# Patient Record
Sex: Female | Born: 1950
Health system: Southern US, Community
[De-identification: ages and names within clinical notes are randomized; demographics above are authoritative.]

## PROBLEM LIST (undated history)

## (undated) DIAGNOSIS — G2581 Restless legs syndrome: Secondary | ICD-10-CM

## (undated) DIAGNOSIS — F99 Mental disorder, not otherwise specified: Secondary | ICD-10-CM

## (undated) DIAGNOSIS — J449 Chronic obstructive pulmonary disease, unspecified: Secondary | ICD-10-CM

## (undated) DIAGNOSIS — C773 Secondary and unspecified malignant neoplasm of axilla and upper limb lymph nodes: Secondary | ICD-10-CM

## (undated) DIAGNOSIS — C50912 Malignant neoplasm of unspecified site of left female breast: Secondary | ICD-10-CM

## (undated) DIAGNOSIS — Z9889 Other specified postprocedural states: Secondary | ICD-10-CM

## (undated) DIAGNOSIS — K219 Gastro-esophageal reflux disease without esophagitis: Secondary | ICD-10-CM

## (undated) DIAGNOSIS — F419 Anxiety disorder, unspecified: Secondary | ICD-10-CM

## (undated) DIAGNOSIS — G219 Secondary parkinsonism, unspecified: Secondary | ICD-10-CM

## (undated) DIAGNOSIS — R112 Nausea with vomiting, unspecified: Secondary | ICD-10-CM

## (undated) DIAGNOSIS — Z87442 Personal history of urinary calculi: Secondary | ICD-10-CM

## (undated) DIAGNOSIS — C801 Malignant (primary) neoplasm, unspecified: Secondary | ICD-10-CM

## (undated) DIAGNOSIS — G709 Myoneural disorder, unspecified: Secondary | ICD-10-CM

## (undated) DIAGNOSIS — N183 Chronic kidney disease, stage 3 (moderate): Secondary | ICD-10-CM

## (undated) DIAGNOSIS — E1165 Type 2 diabetes mellitus with hyperglycemia: Secondary | ICD-10-CM

## (undated) DIAGNOSIS — M1990 Unspecified osteoarthritis, unspecified site: Secondary | ICD-10-CM

## (undated) DIAGNOSIS — E039 Hypothyroidism, unspecified: Secondary | ICD-10-CM

## (undated) DIAGNOSIS — E119 Type 2 diabetes mellitus without complications: Secondary | ICD-10-CM

## (undated) DIAGNOSIS — G4730 Sleep apnea, unspecified: Secondary | ICD-10-CM

## (undated) DIAGNOSIS — Z794 Long term (current) use of insulin: Secondary | ICD-10-CM

## (undated) HISTORY — DX: Restless legs syndrome: G25.81

## (undated) HISTORY — DX: Long term (current) use of insulin: Z79.4

## (undated) HISTORY — PX: APPENDECTOMY: SHX54

## (undated) HISTORY — PX: ROTATOR CUFF REPAIR: SHX139

## (undated) HISTORY — DX: Type 2 diabetes mellitus with hyperglycemia: E11.65

## (undated) HISTORY — PX: EXPLORATORY LAPAROTOMY: SUR591

## (undated) HISTORY — DX: Secondary parkinsonism, unspecified: G21.9

## (undated) HISTORY — PX: HEMORRHOID SURGERY: SHX153

## (undated) HISTORY — DX: Chronic kidney disease, stage 3 (moderate): N18.3

## (undated) HISTORY — PX: ANKLE SURGERY: SHX546

## (undated) HISTORY — PX: TONSILLECTOMY: SUR1361

## (undated) HISTORY — PX: TUBAL LIGATION: SHX77

## (undated) HISTORY — PX: CHOLECYSTECTOMY: SHX55

---

## 2003-06-30 ENCOUNTER — Inpatient Hospital Stay (HOSPITAL_COMMUNITY): Admission: AD | Admit: 2003-06-30 | Discharge: 2003-07-09 | Payer: Self-pay | Admitting: Psychiatry

## 2003-07-02 ENCOUNTER — Encounter: Payer: Self-pay | Admitting: Psychiatry

## 2003-07-02 IMAGING — CT CT HEAD W/O CM
2 of 3 series · 14 of 33 positions shown, 18 images · non-contrast
Comparison: none

CLINICAL DATA: 52 year old with confusion.  
 CT OF THE HEAD WITHOUT CONTRAST
 No prior exams available for comparison. 
 Axial images are acquired through the brain at 5 mm increments without contrast.  There is no intra- or extra-axial fluid collection or mass.  The basilar cisterns and ventricles have a normal appearance.  Bone windows are unremarkable.  
 IMPRESSION
 No CT evidence for acute intracranial abnormality.

[Series 2: — · sagittal · 0.49mm/px · 1 of 3 slices shown (1 of 2)]
[im 2/3  brain]
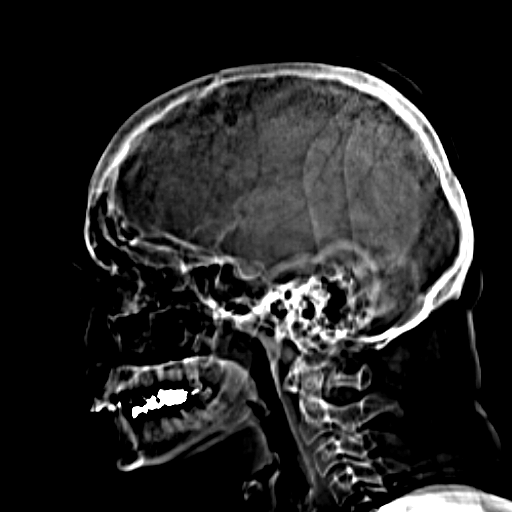

[Series 3: — · axial · 0.43mm/px · z∈[-171,-51]mm · 13 of 29 slices shown, 17 images (2 of 2)]
[im 3/29  brain]
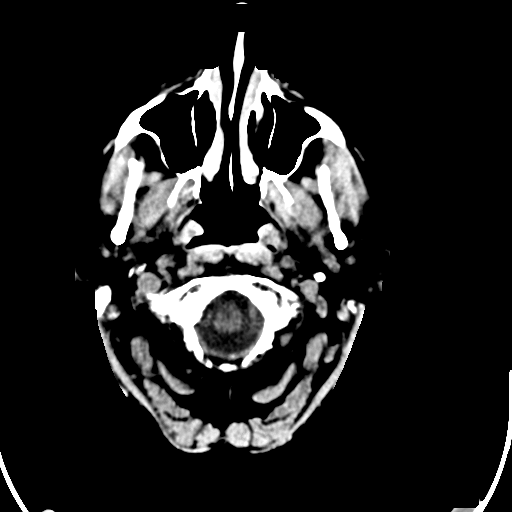
[im 3/29  bone]
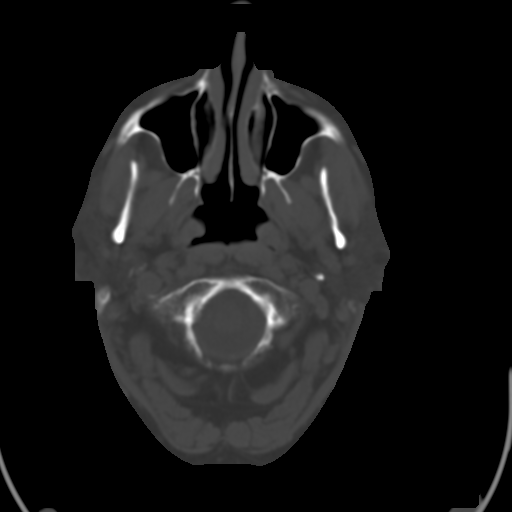
[im 5/29  brain]
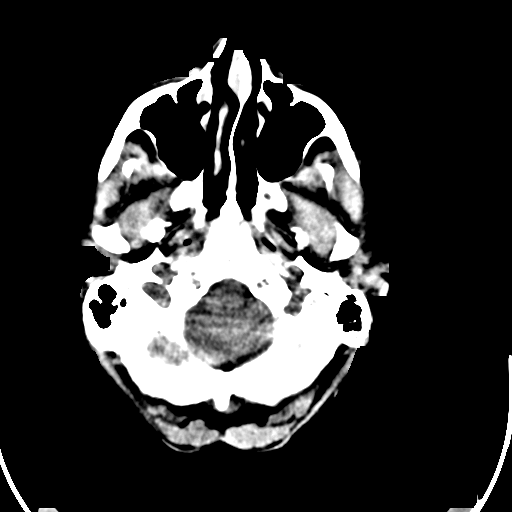
[im 7/29  brain]
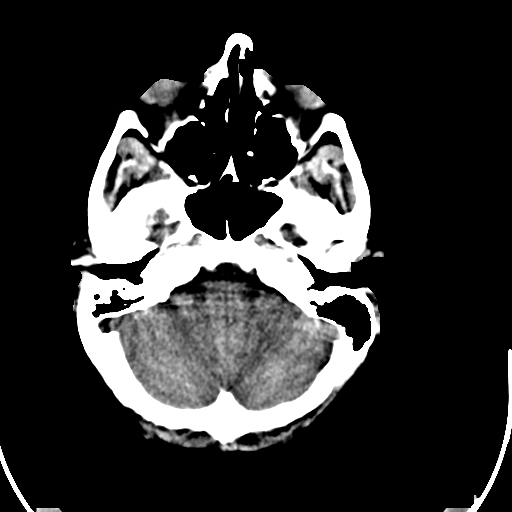
[im 9/29  brain]
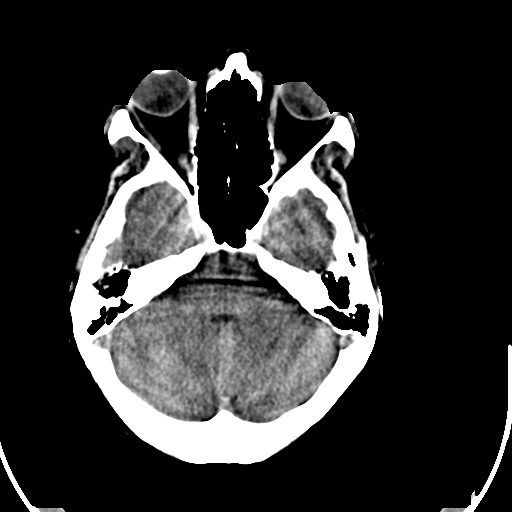
[im 11/29  brain]
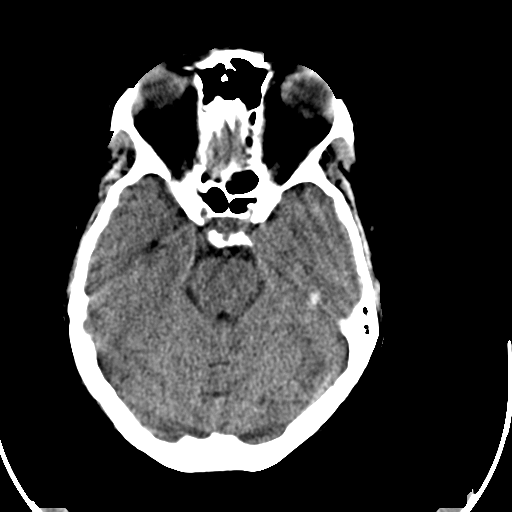
[im 11/29  bone]
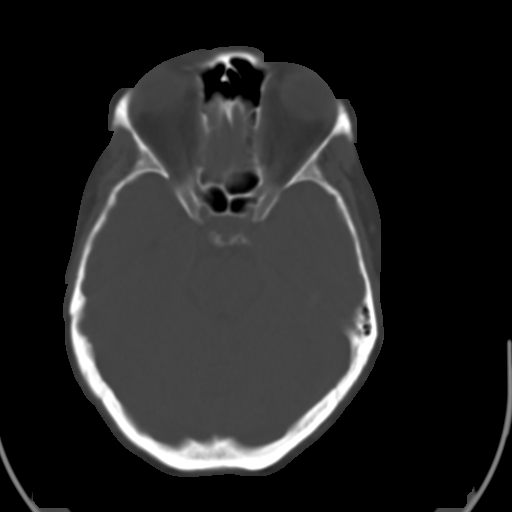
[im 13/29  brain]
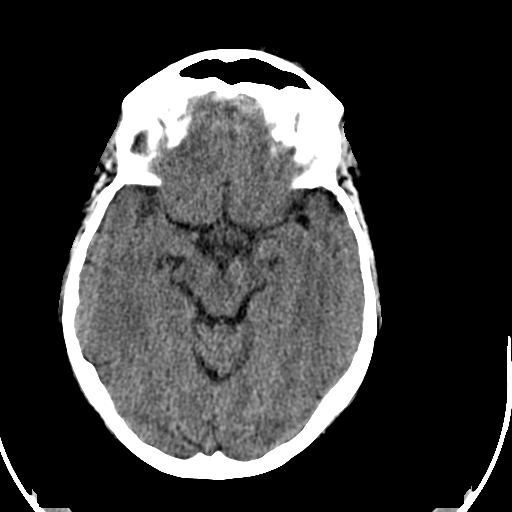
[im 15/29  brain]
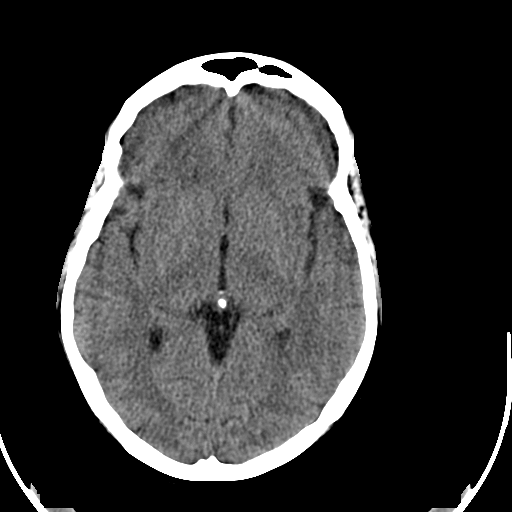
[im 17/29  brain]
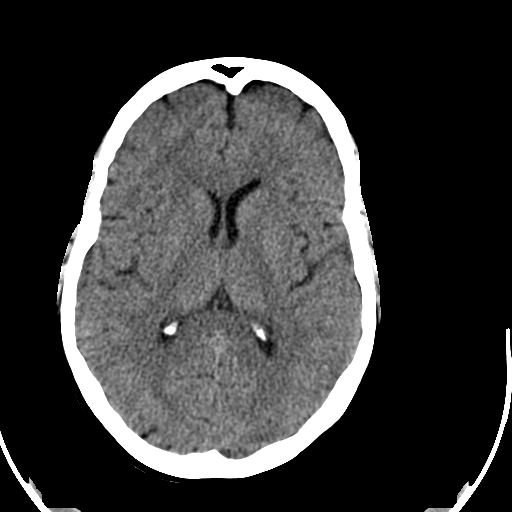
[im 19/29  brain]
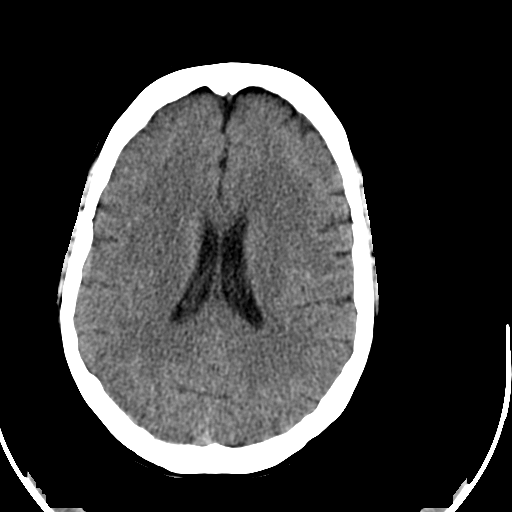
[im 19/29  bone]
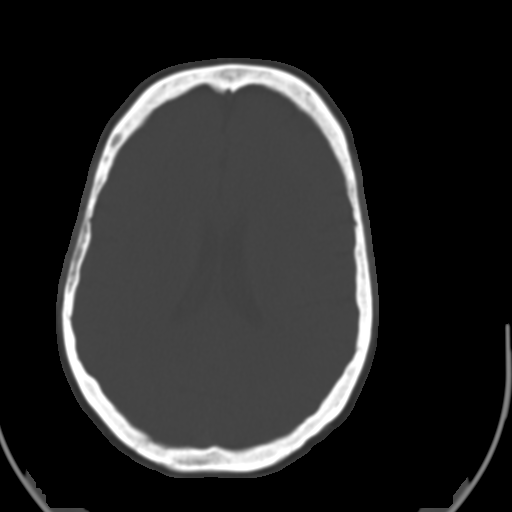
[im 21/29  brain]
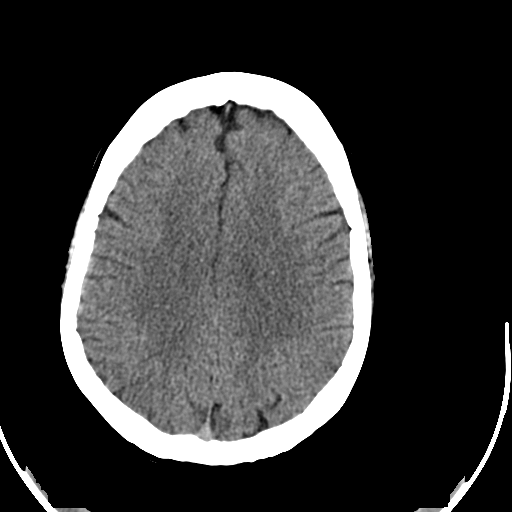
[im 23/29  brain]
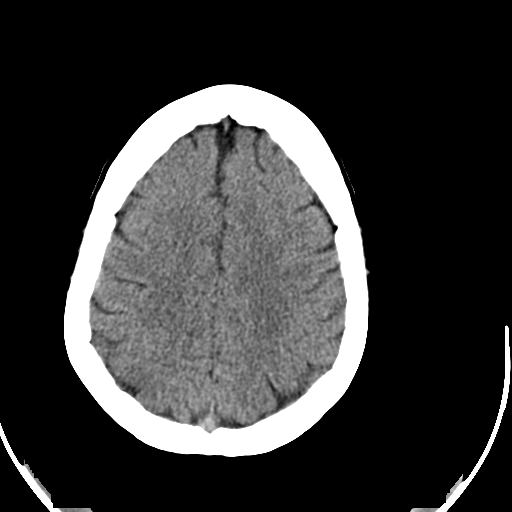
[im 25/29  brain]
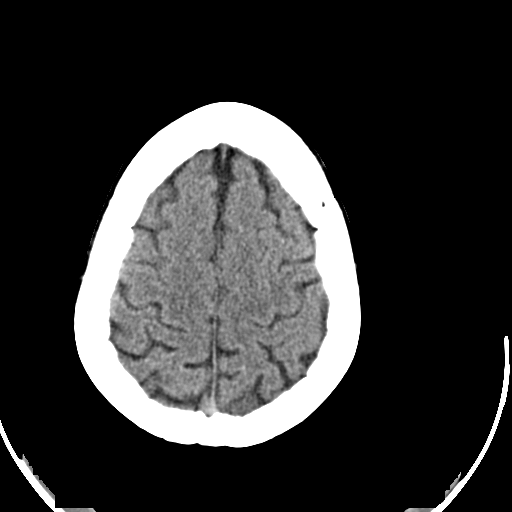
[im 27/29  brain]
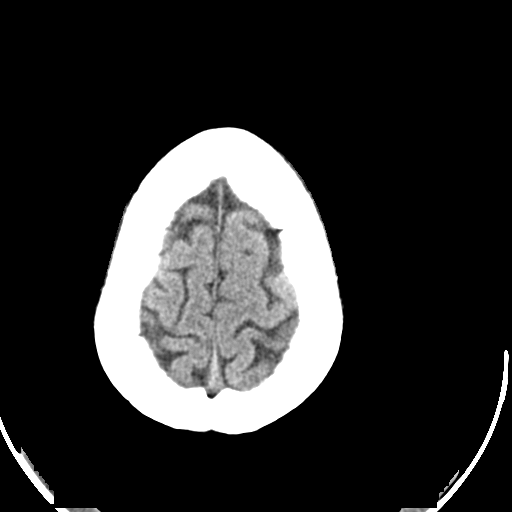
[im 27/29  bone]
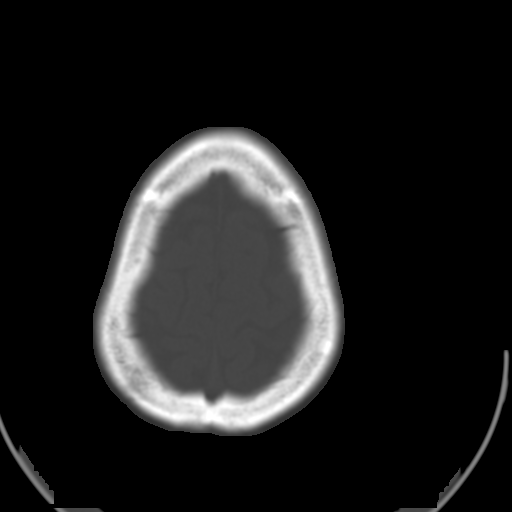

[14 of 33 positions shown; findings below may reference images not displayed]

## 2003-07-05 ENCOUNTER — Encounter: Payer: Self-pay | Admitting: Emergency Medicine

## 2003-07-05 IMAGING — CR DG CHEST 2V
2 series · 2 of 2 positions shown · non-contrast
Comparison: none

CLINICAL DATA: Swelling of the lower extremities; lethargy
 TWO VIEW CHEST
 No prior studies. 
 Thoracic spondylosis is present.  The lungs appear clear.  Heart and mediastinum appear unremarkable.  On the lateral view, a hernia measures evident. 
 IMPRESSION
 No acute findings.

[view not recorded (1 of 2)]
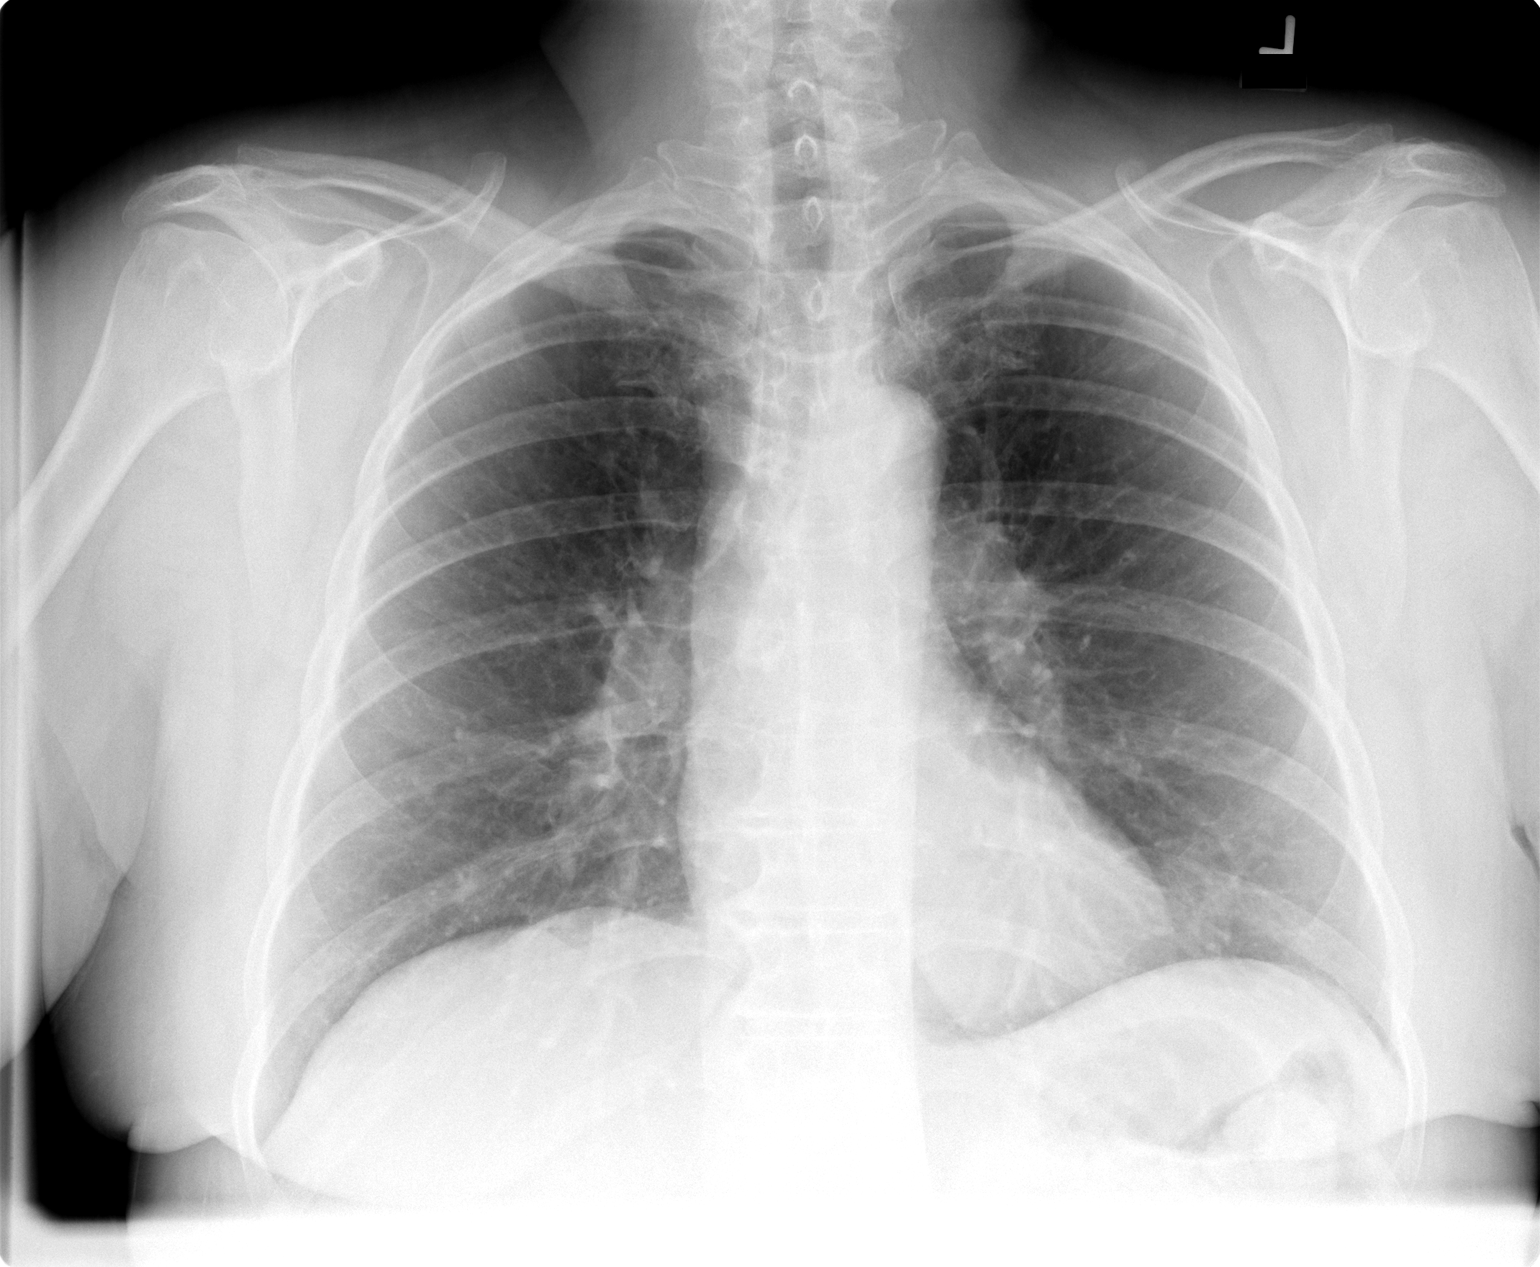

[view not recorded (2 of 2)]
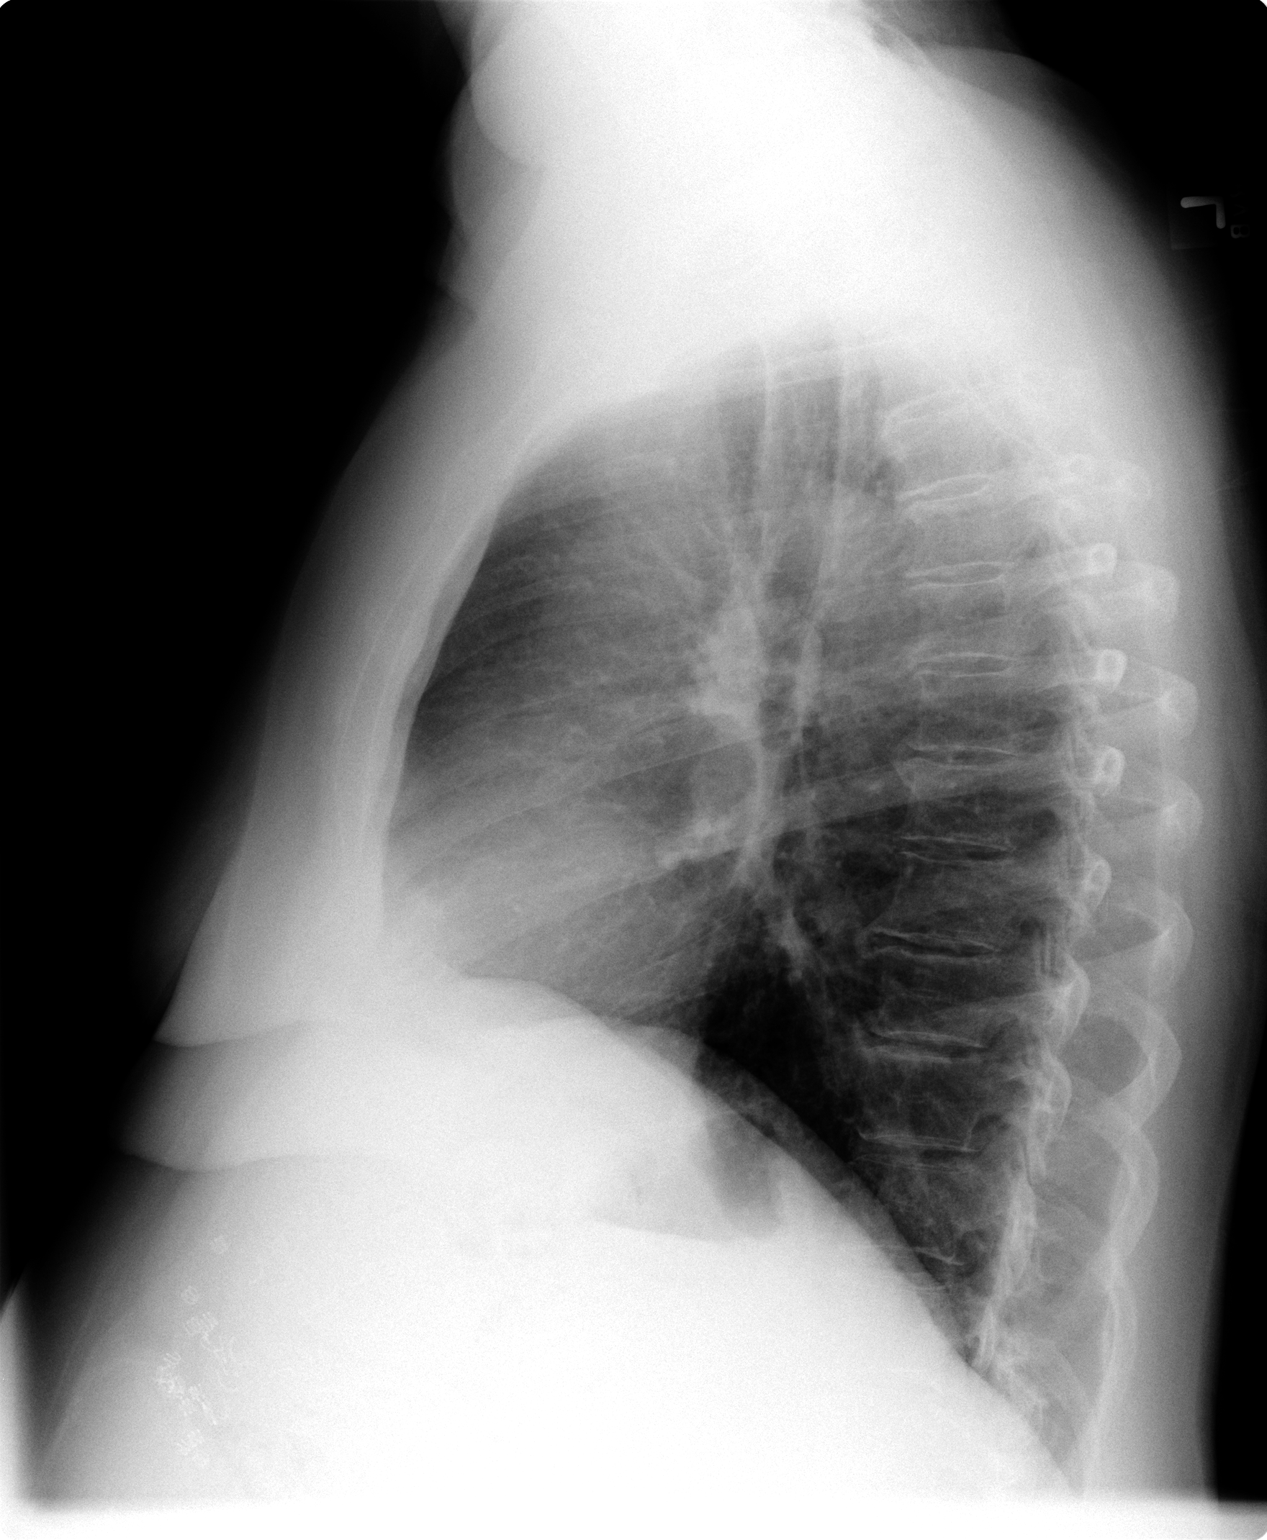

[2 of 2 positions shown; findings below may reference images not displayed]

## 2004-03-10 ENCOUNTER — Ambulatory Visit (HOSPITAL_COMMUNITY): Admission: RE | Admit: 2004-03-10 | Discharge: 2004-03-10 | Payer: Self-pay | Admitting: Family Medicine

## 2004-03-10 IMAGING — CT CT PELVIS W/ CM
1 of 3 series · 14 of 32 positions shown, 19 images · IV contrast (CONTRAST)
Comparison: none

CLINICAL DATA: Bilateral flank pain x 1 month.  Hematuria.  Weight gain.
 CT OF THE ABDOMEN WITH IV CONTRAST ? [DATE]:
 Multidetector helical study was performed during IV contrast enhancement with 100 cc of Omnipaque 300.
 There is a partially calcified gallstone seen within the neck of the gallbladder.  There is a large fluid collection associated with mesh utilized for ventral hernia repair.  This fluid collection measures 18 x 5.5 x 14.8 cm in size.  It is possible that this represents a chronic seroma or lymphocele.  The liver, spleen, pancreas, and adrenal glands have a normal appearance.  There is a 3 mm in size mid left renal calculus.  The kidneys otherwise have a normal appearance.  There is no adenopathy and the abdominal aorta is normal in size.

[Series 5908: — · axial · 0.66mm/px · z∈[+1214,+1648]mm · 14 of 99 slices shown, 19 images]
[im 6/99  soft-tissue]
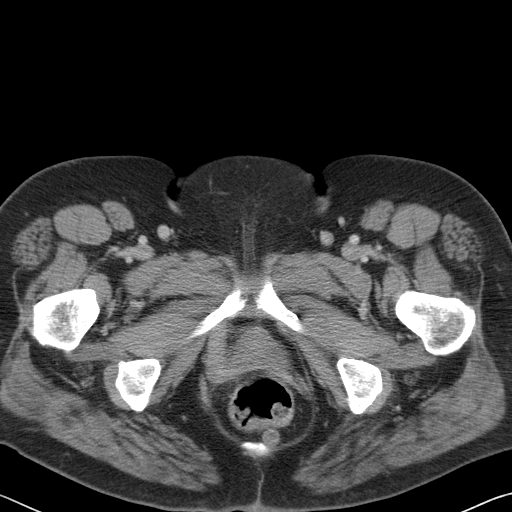
[im 6/99  bone]
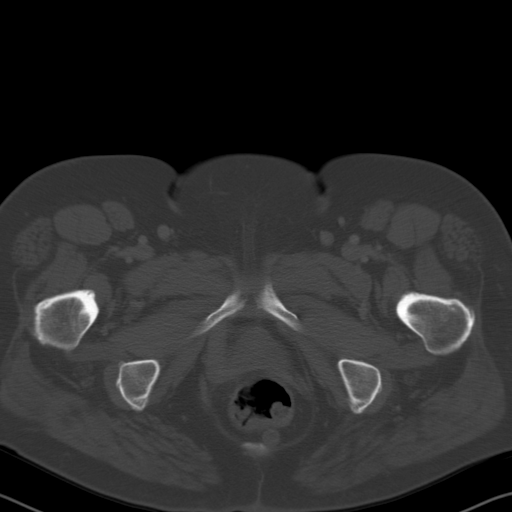
[im 11/99  soft-tissue]
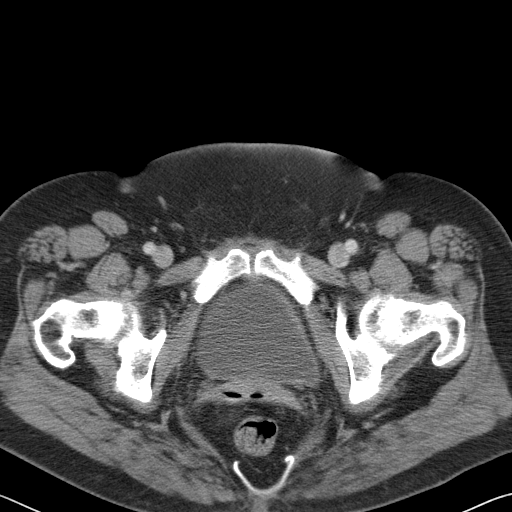
[im 22/99  soft-tissue]
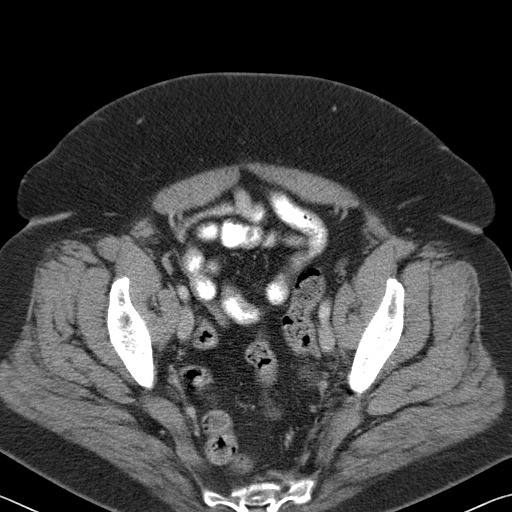
[im 28/99  soft-tissue]
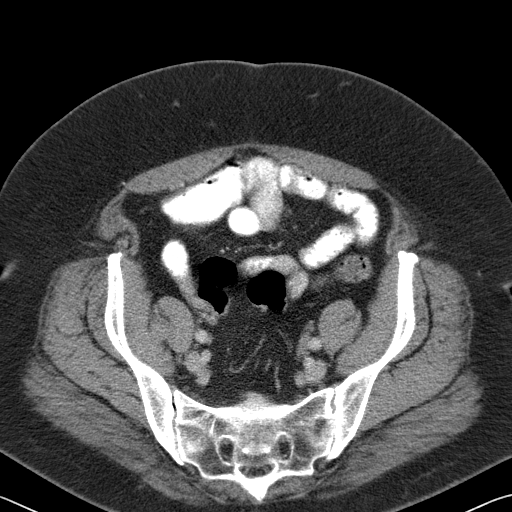
[im 33/99  soft-tissue]
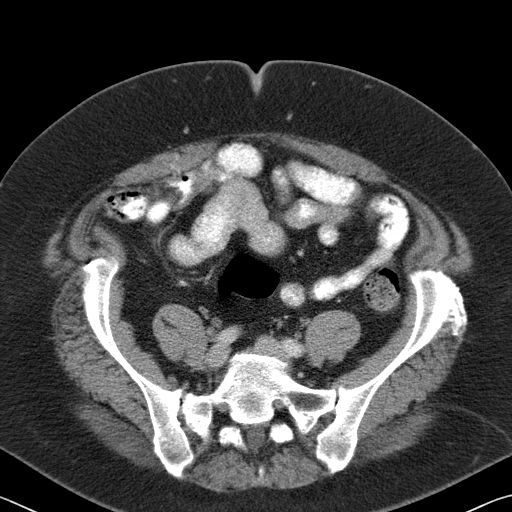
[im 44/99  soft-tissue]
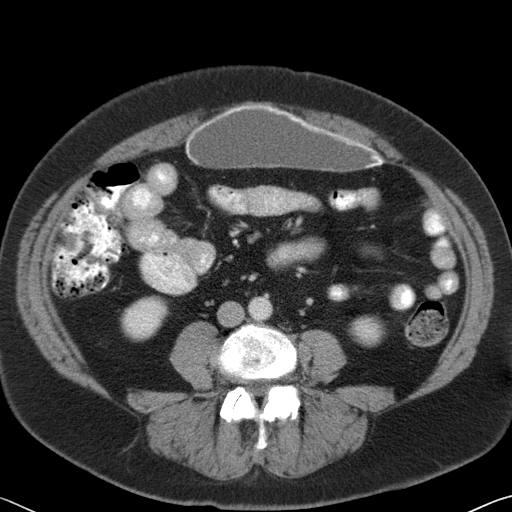
[im 50/99  soft-tissue]
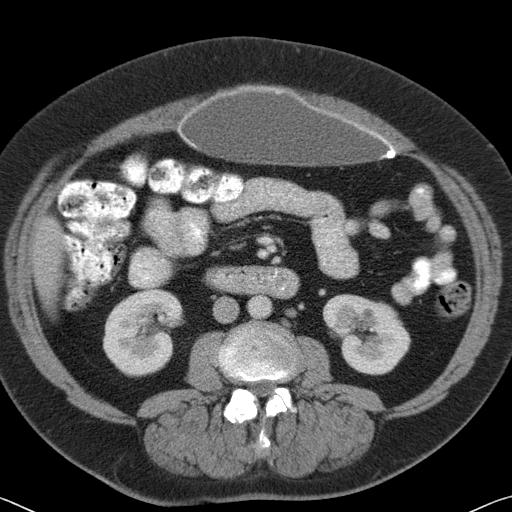
[im 55/99  soft-tissue]
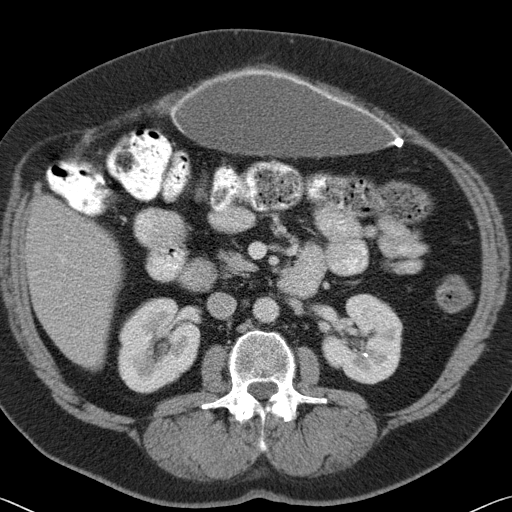
[im 66/99  soft-tissue]
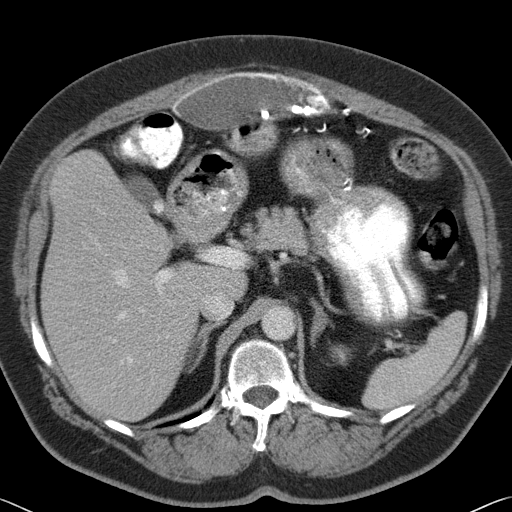
[im 66/99  bone]
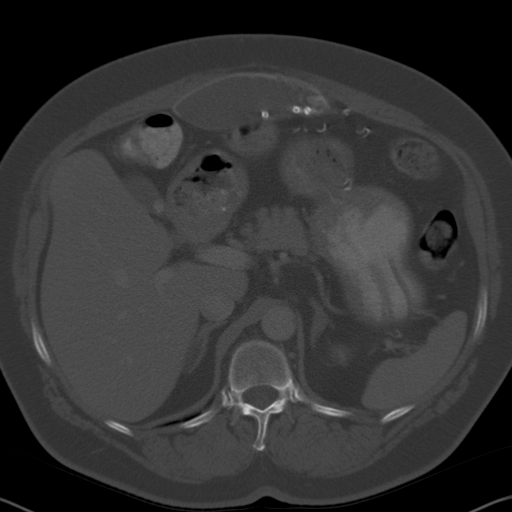
[im 71/99  soft-tissue]
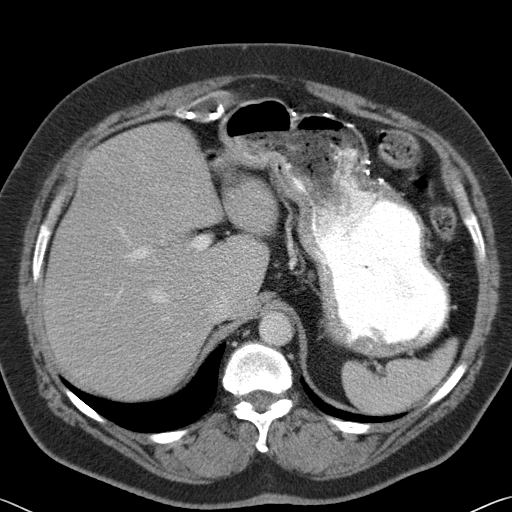
[im 77/99  soft-tissue]
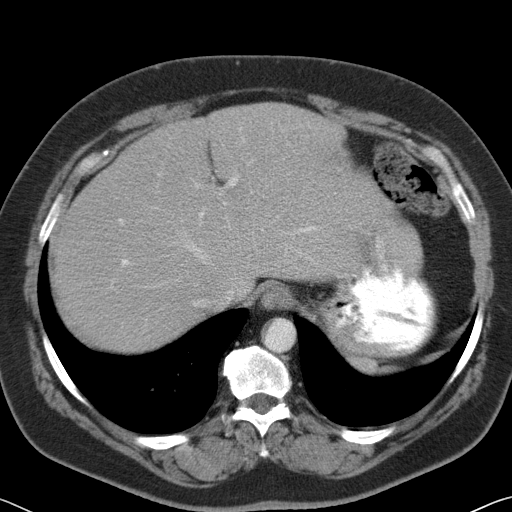
[im 77/99  lung]
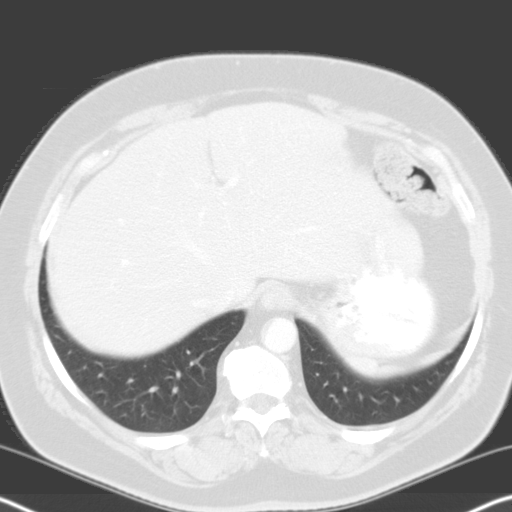
[im 82/99  lung]
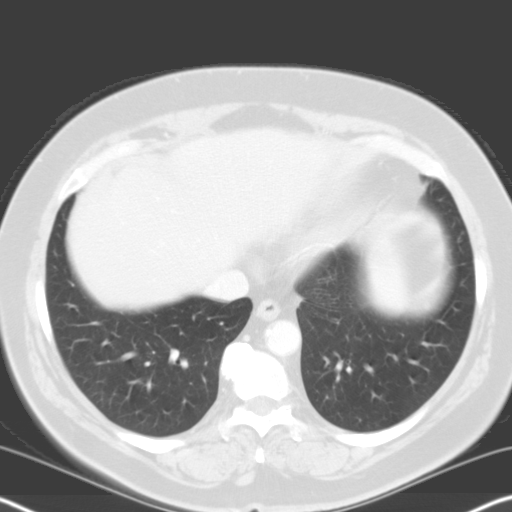
[im 88/99  soft-tissue]
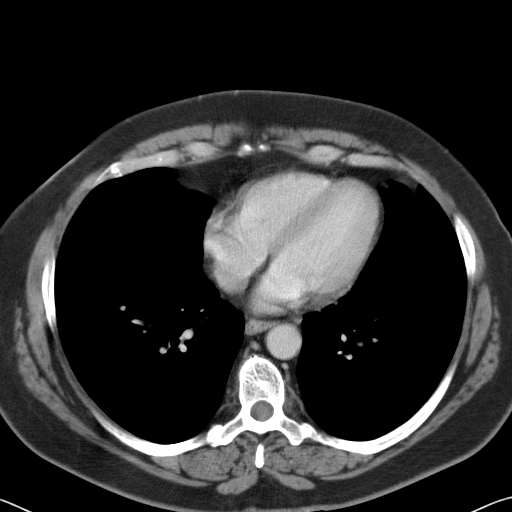
[im 88/99  lung]
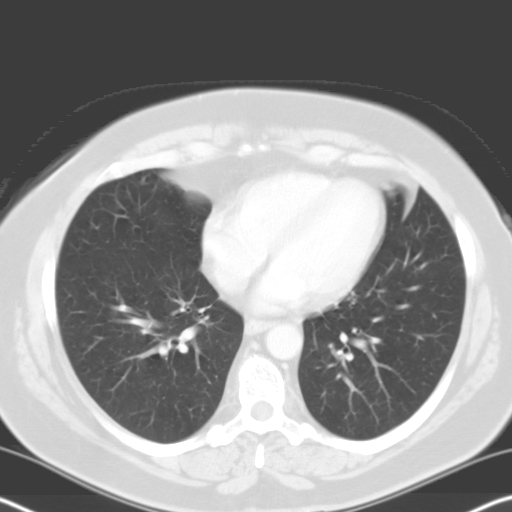
[im 93/99  soft-tissue]
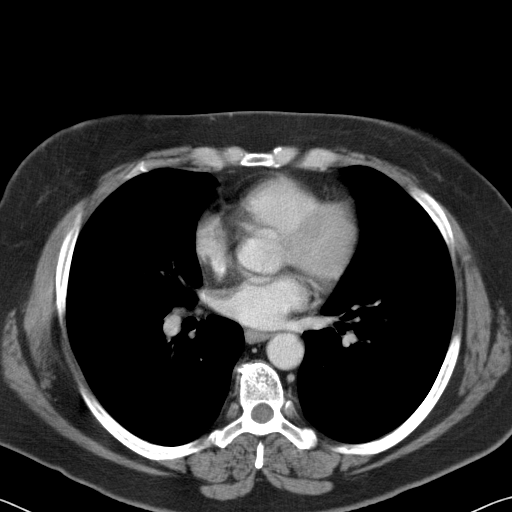
[im 93/99  lung]
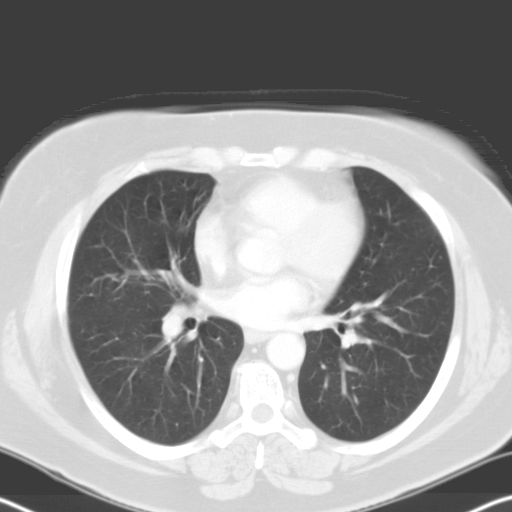

[14 of 32 positions shown; findings below may reference images not displayed]

IMPRESSION: 1.  Large fluid collection associated with the anterior abdominal wall in the area of the patient?s previous mesh for repair of a ventral hernia.  Possibly this represents a lymphocele or chronic seroma.  
 2.  Cholelithiasis.
 3.  Small left renal calculus.
 CT OF THE PELVIS WITH IV CONTRAST ? [DATE]: 
 There is no pelvic mass or adenopathy and there is no free pelvic fluid.
IMPRESSION: Negative CT of the pelvis.

## 2004-03-17 ENCOUNTER — Ambulatory Visit (HOSPITAL_COMMUNITY): Admission: RE | Admit: 2004-03-17 | Discharge: 2004-03-17 | Payer: Self-pay | Admitting: General Surgery

## 2004-03-17 IMAGING — US US ABDOMEN COMPLETE
1 series · 14 of 25 positions shown · non-contrast
Comparison: CT abdomen and pelvis [DATE].

CLINICAL DATA: Abdominal pain. Gallstones identified on recent CT.

COMPLETE ABDOMINAL ULTRASOUND  [DATE]:

[Series 1: unknown · 0.34mm/px · 14 of 66 slices shown]
[im 1/66]
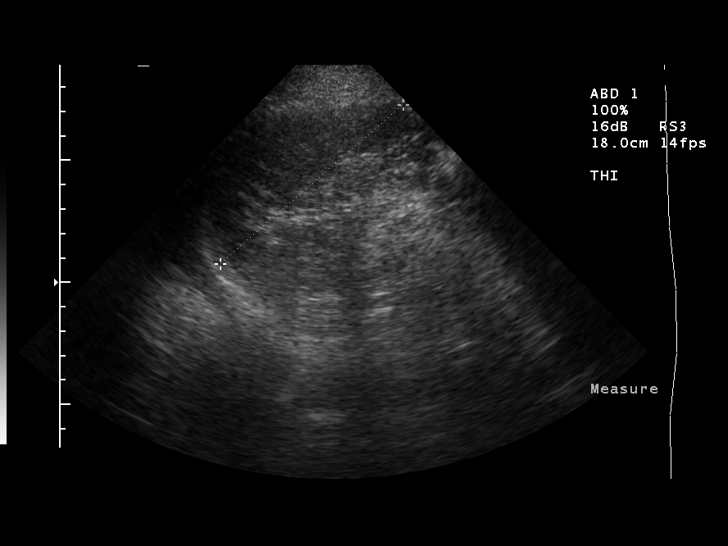
[im 6/66]
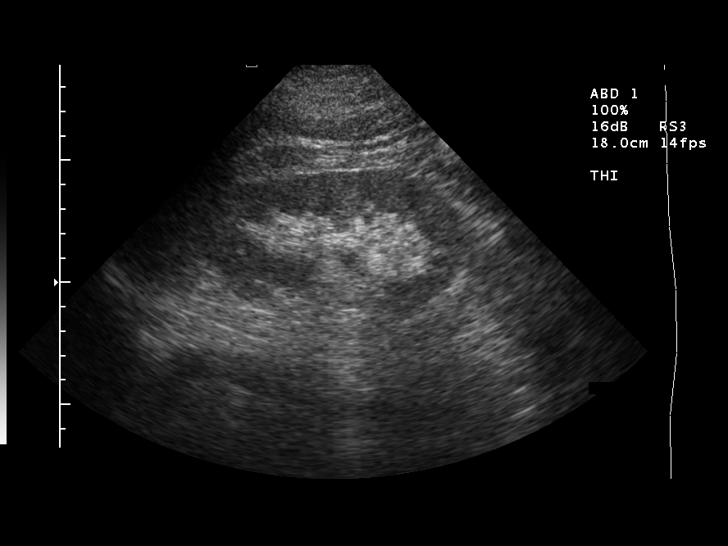
[im 11/66]
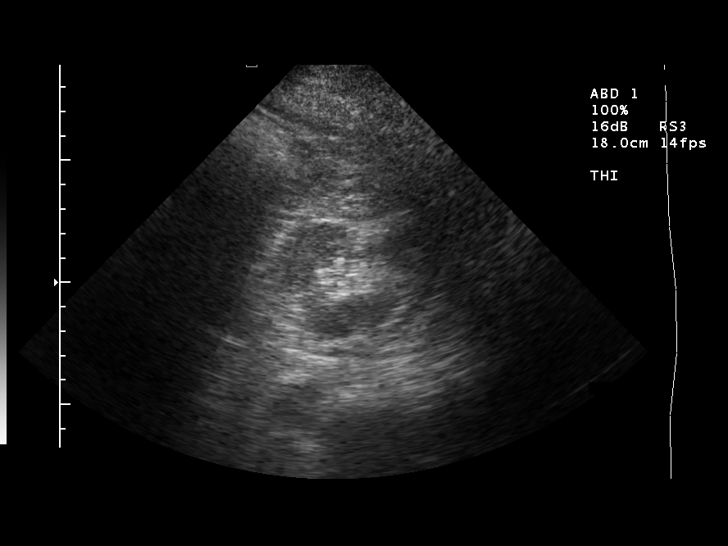
[im 17/66]
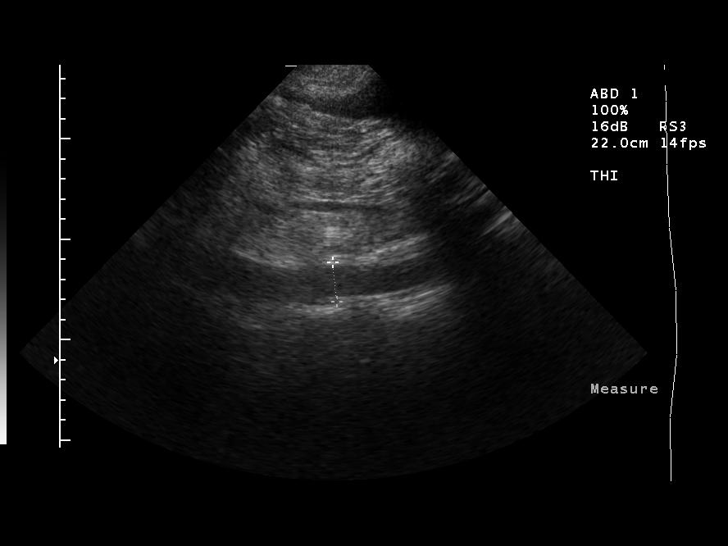
[im 22/66]
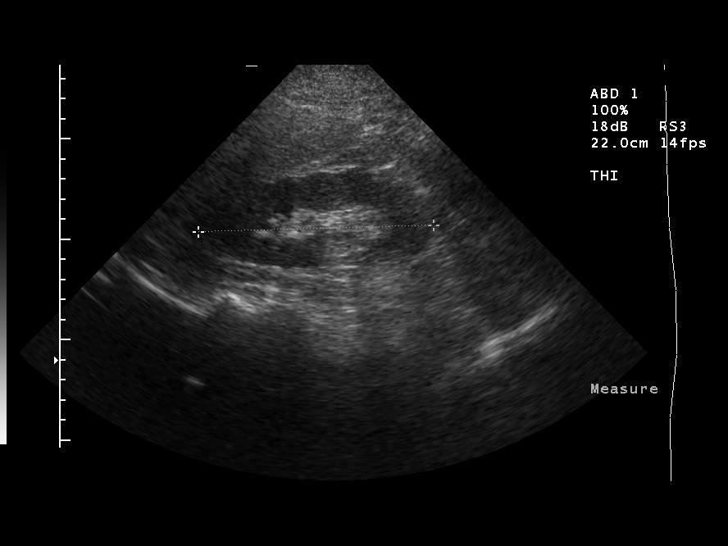
[im 25/66]
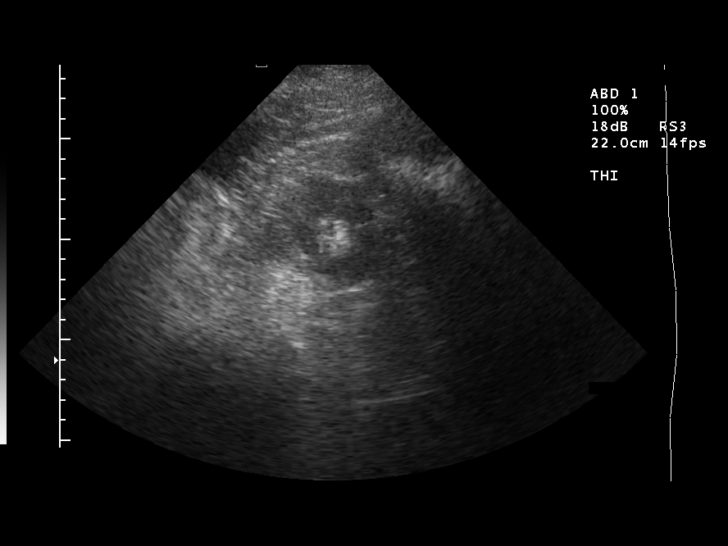
[im 30/66]
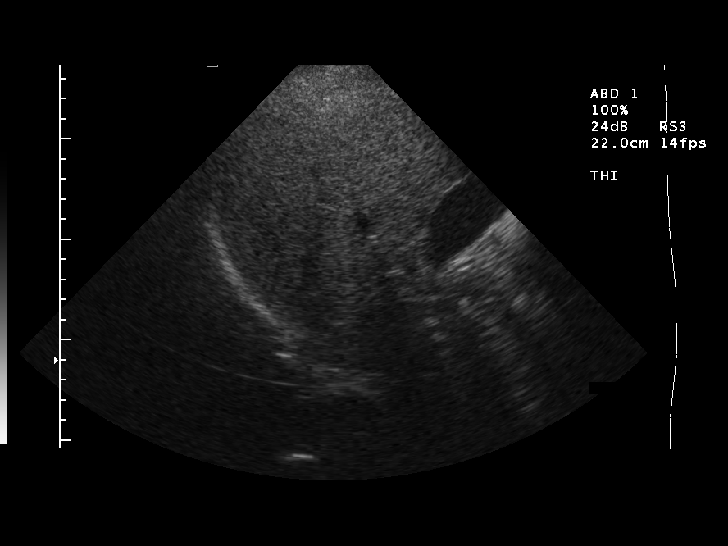
[im 36/66]
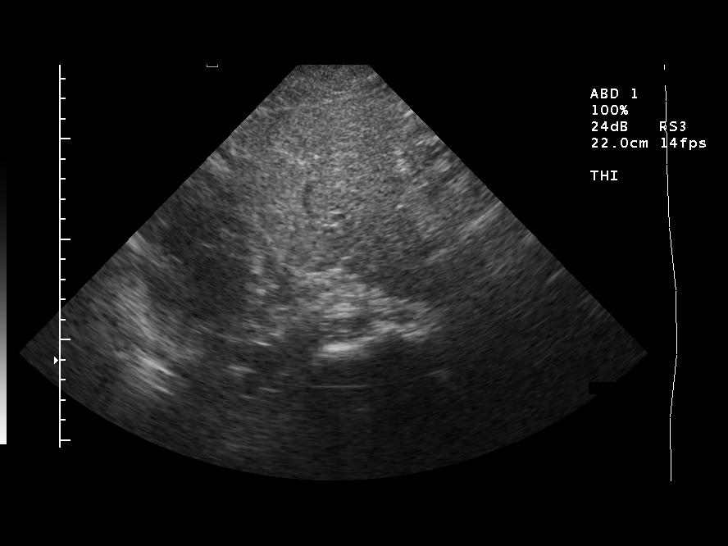
[im 41/66]
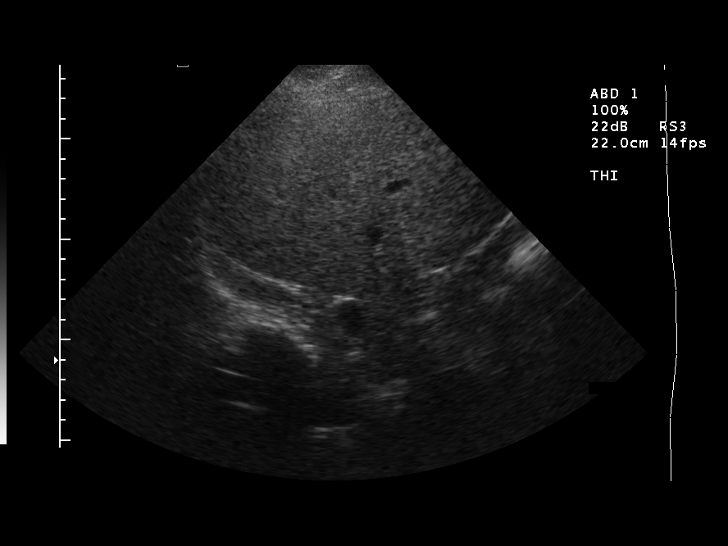
[im 44/66]
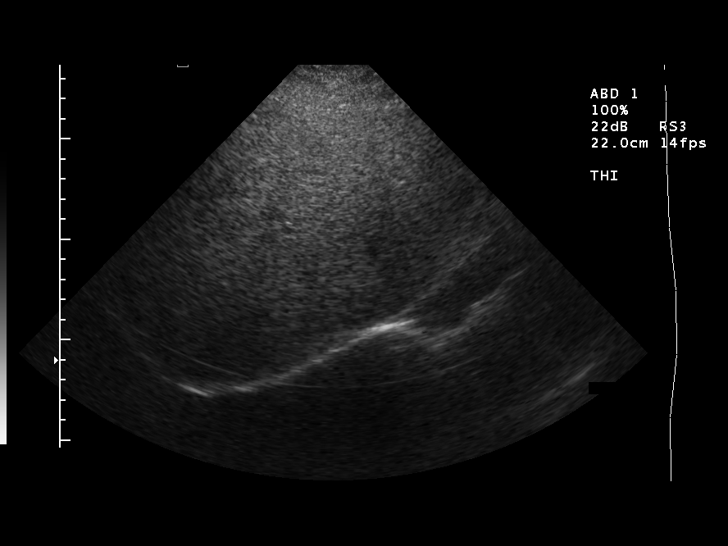
[im 49/66]
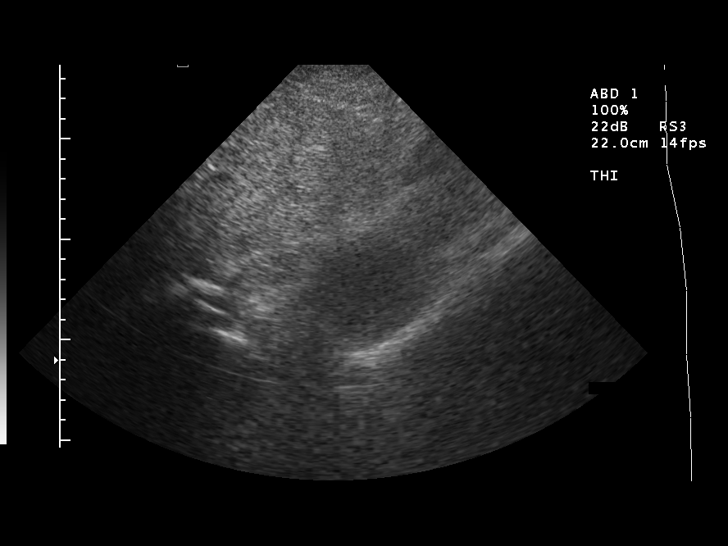
[im 55/66]
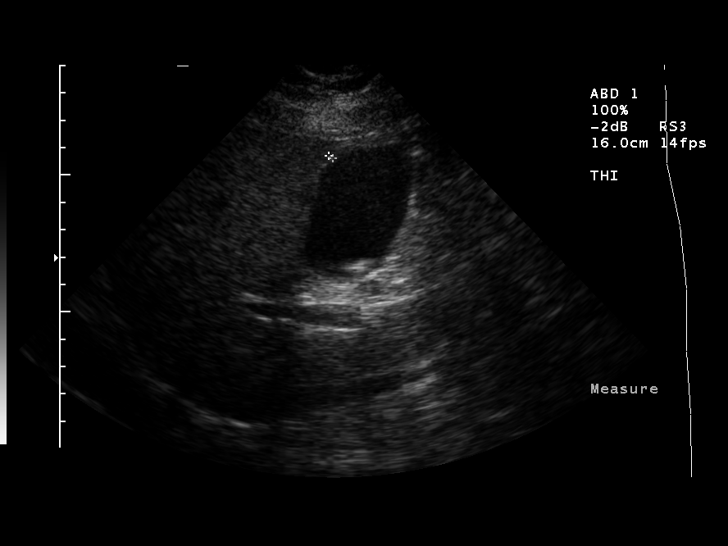
[im 60/66]
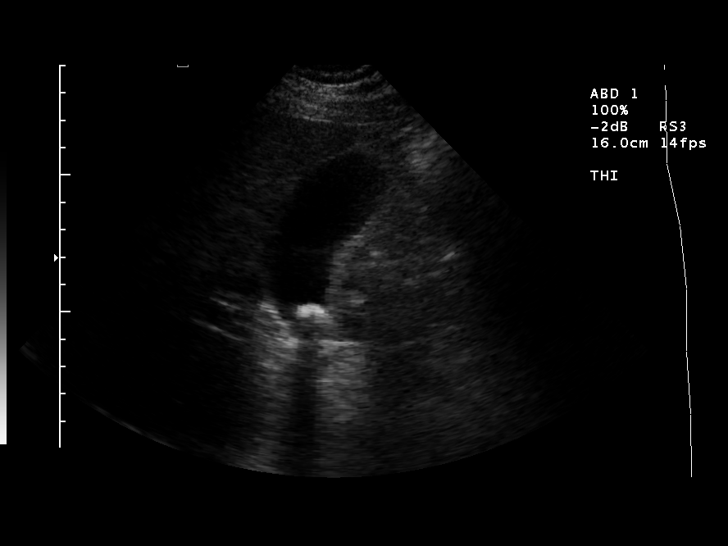
[im 66/66]
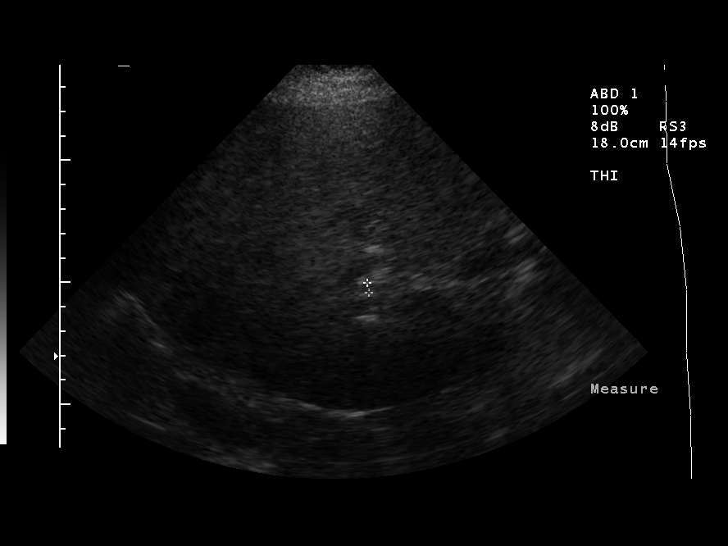

[14 of 25 positions shown; findings below may reference images not displayed]

FINDINGS: Gallbladder:  Cholelithiasis. No gallbladder wall thickening or pericholecystic
fluid.

Common bile duct: Normal, 4 mm diameter.

Liver:  Mildly increased and coarsened echotexture suggesting mild fatty
infiltration. No focal abnormalities.

Inferior vena cava:  Patent.

Pancreas:  Not visualized due to overlying bowel gas; the pancreas was normal in
appearance on the CT abdomen.

Spleen:  Normal, 9.9 cm.

Right kidney:  Normal, 11.7 cm.

Left kidney:  Normal, 11.6 cm.

Abdominal aorta:  Normal.
IMPRESSION: 1. Cholelithiasis without sonographic evidence of acute cholecystitis.

2. Perhaps mild diffuse fatty infiltration of the liver.

3. Pancreas obscured by overlying bowel gas; the pancreas was normal on the
recent CT.

## 2004-03-25 ENCOUNTER — Inpatient Hospital Stay (HOSPITAL_COMMUNITY): Admission: RE | Admit: 2004-03-25 | Discharge: 2004-03-27 | Payer: Self-pay | Admitting: General Surgery

## 2005-08-12 ENCOUNTER — Ambulatory Visit (HOSPITAL_COMMUNITY): Admission: RE | Admit: 2005-08-12 | Discharge: 2005-08-12 | Payer: Self-pay | Admitting: Family Medicine

## 2005-09-29 ENCOUNTER — Emergency Department (HOSPITAL_COMMUNITY): Admission: EM | Admit: 2005-09-29 | Discharge: 2005-09-29 | Payer: Self-pay | Admitting: *Deleted
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
## 2010-12-21 ENCOUNTER — Ambulatory Visit: Payer: Self-pay | Admitting: Physical Medicine and Rehabilitation

## 2011-03-23 ENCOUNTER — Ambulatory Visit (HOSPITAL_COMMUNITY)
Admission: RE | Admit: 2011-03-23 | Discharge: 2011-03-23 | Disposition: A | Discharge status: Home / Self Care | Payer: Medicare Other | Source: Ambulatory Visit | Attending: Pulmonary Disease | Admitting: Pulmonary Disease

## 2011-03-23 ENCOUNTER — Other Ambulatory Visit (HOSPITAL_COMMUNITY): Payer: Self-pay | Admitting: Pulmonary Disease

## 2011-03-23 DIAGNOSIS — J449 Chronic obstructive pulmonary disease, unspecified: Secondary | ICD-10-CM | POA: Insufficient documentation

## 2011-03-23 DIAGNOSIS — R0989 Other specified symptoms and signs involving the circulatory and respiratory systems: Secondary | ICD-10-CM | POA: Insufficient documentation

## 2011-03-23 DIAGNOSIS — J4489 Other specified chronic obstructive pulmonary disease: Secondary | ICD-10-CM | POA: Insufficient documentation

## 2011-03-23 DIAGNOSIS — R0609 Other forms of dyspnea: Secondary | ICD-10-CM | POA: Insufficient documentation

## 2011-03-23 LAB — BLOOD GAS, ARTERIAL
Acid-base deficit: 3.2 mmol/L — ABNORMAL HIGH (ref 0.0–2.0)
pCO2 arterial: 36.1 mmHg (ref 35.0–45.0)

## 2011-03-23 IMAGING — CR DG CHEST 2V
2 series · 2 of 2 positions shown · non-contrast
Comparison: None

CLINICAL DATA: COPD, shortness of breath for 2 months, cough,
history smoking, diabetes

CHEST - 2 VIEW

[view not recorded (1 of 2)]
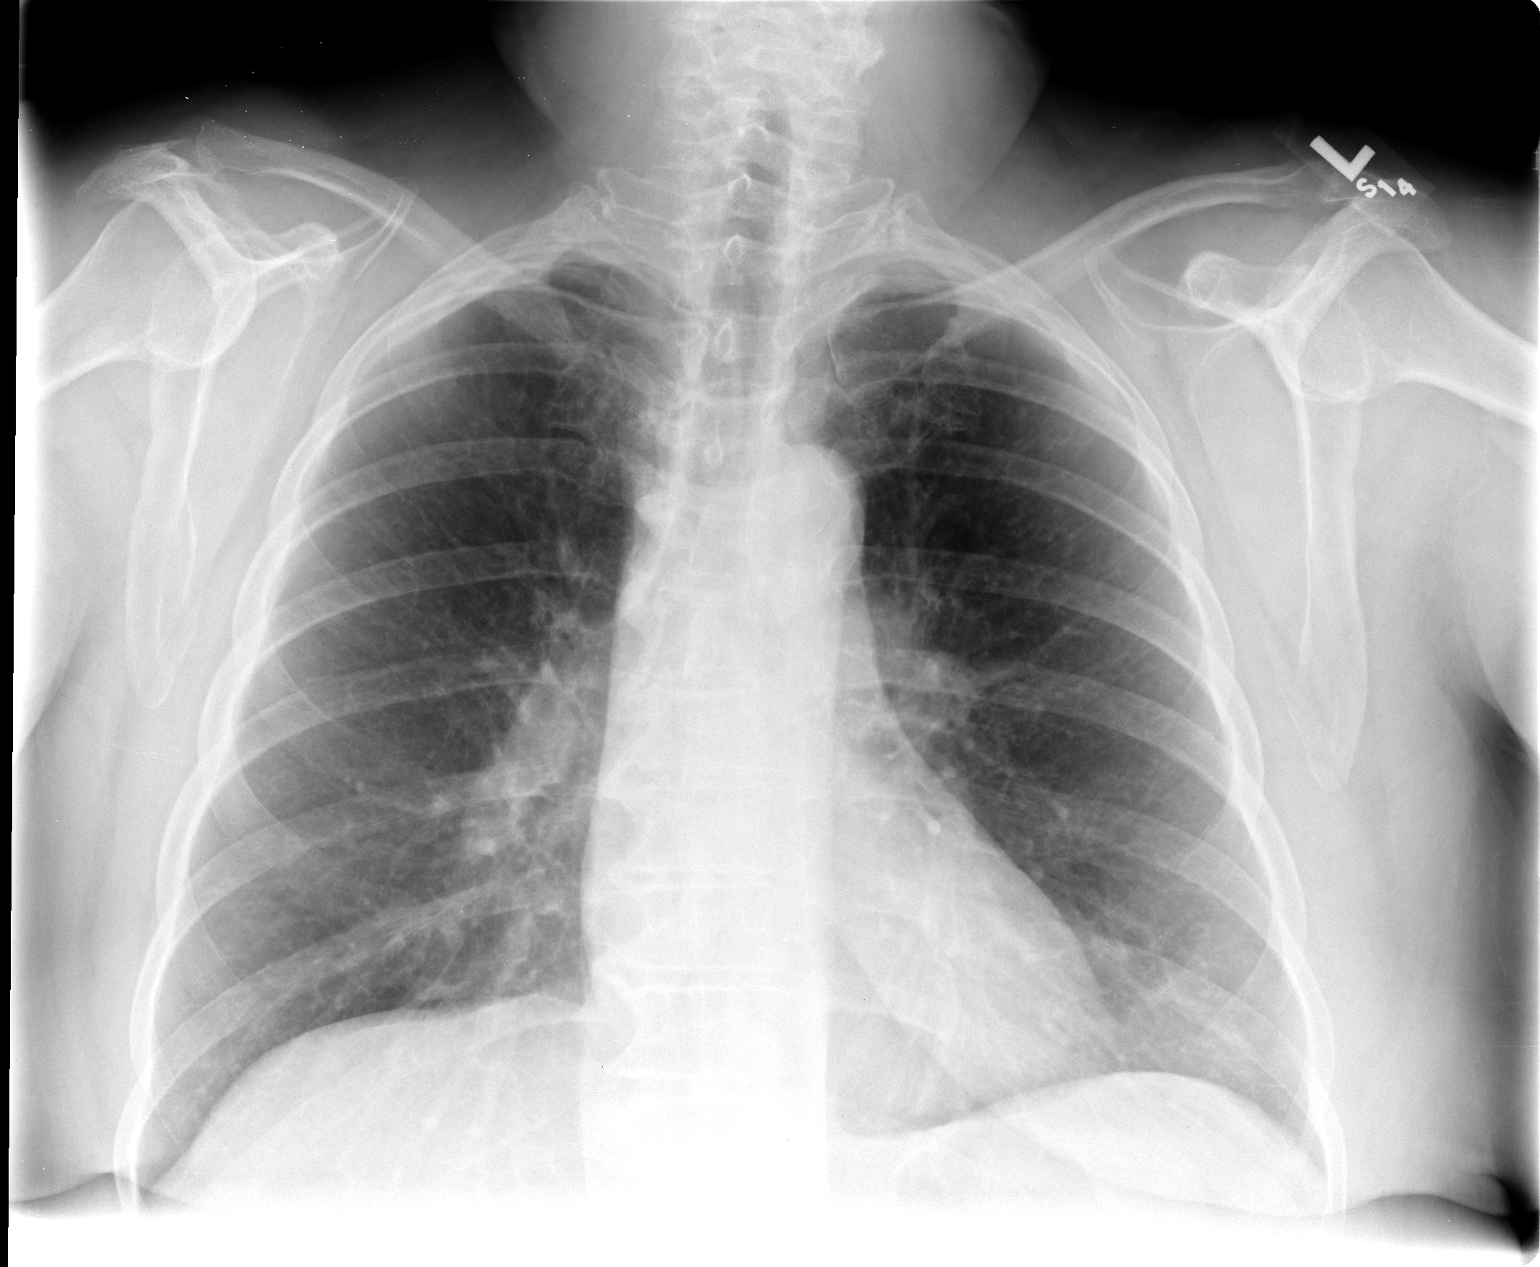

[view not recorded (2 of 2)]
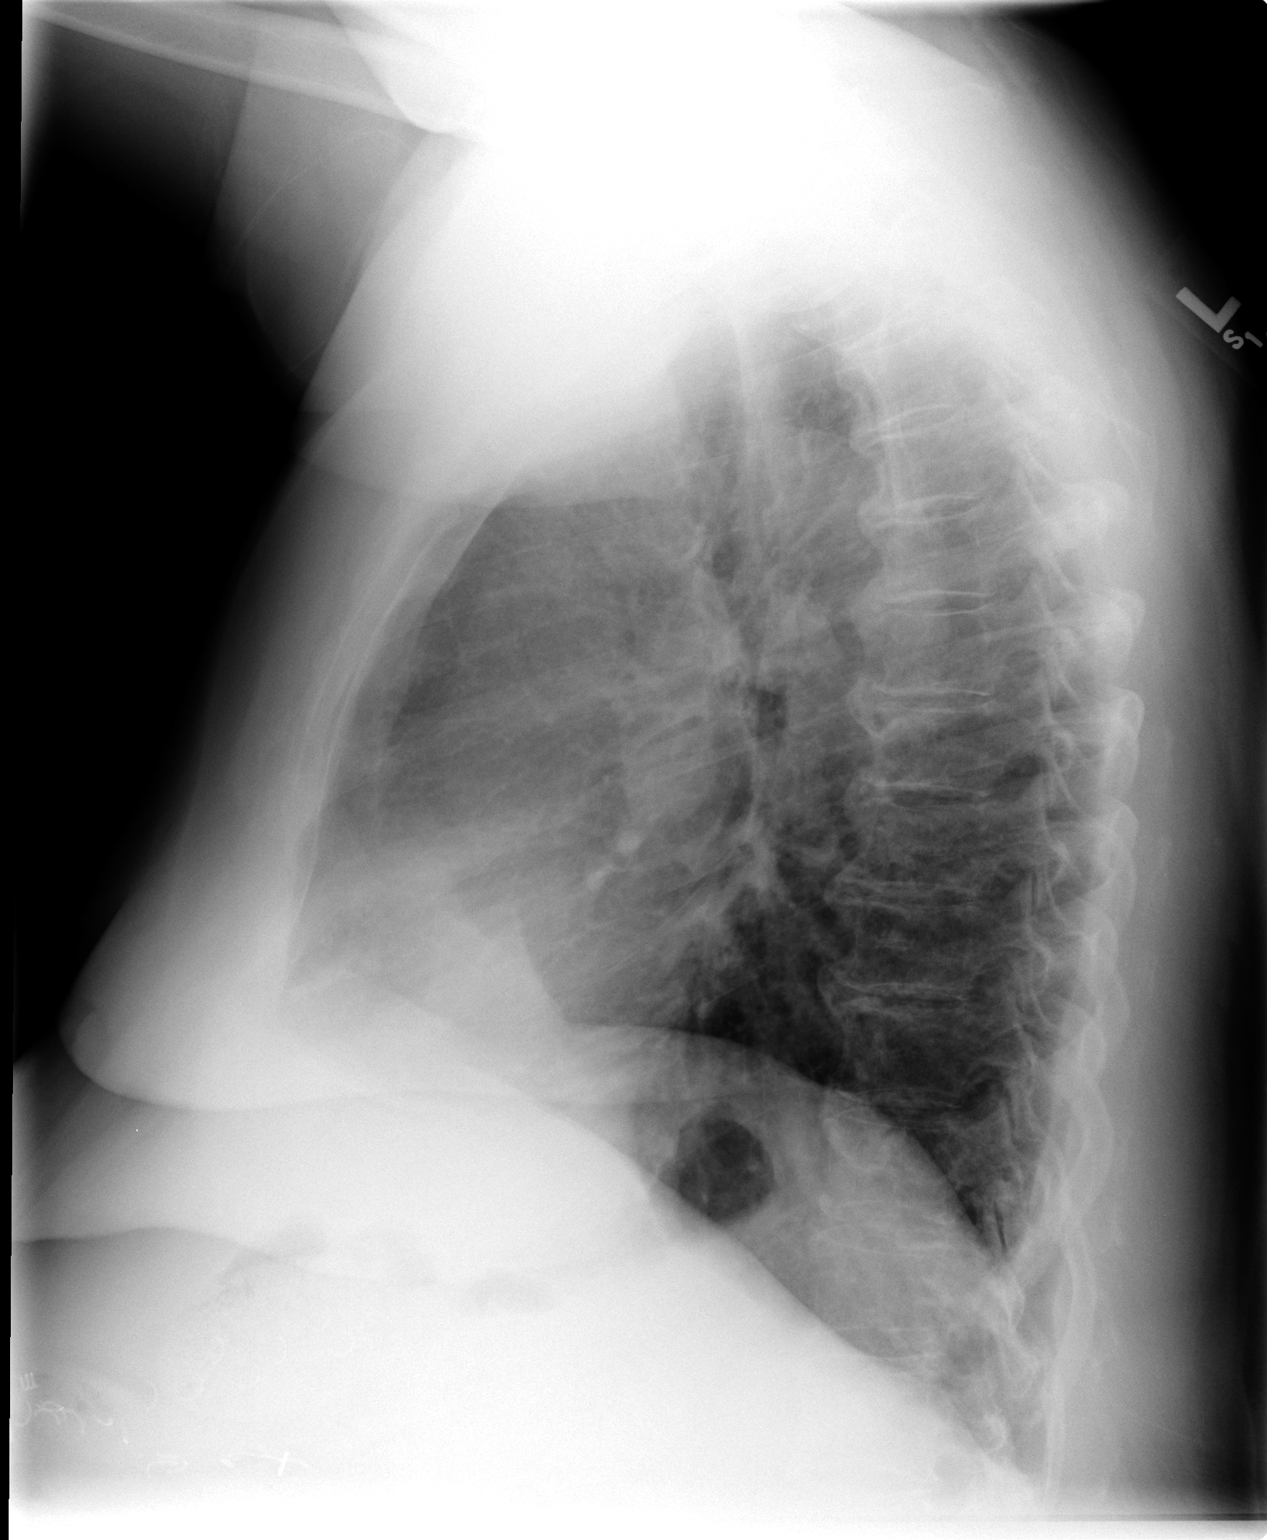

[2 of 2 positions shown; findings below may reference images not displayed]

FINDINGS: Normal heart size, mediastinal contours, and pulmonary vascularity.
Minimal peribronchial thickening.
No pulmonary infiltrate, pleural effusion, or pneumothorax.
Scattered end plate spur formation cervical and thoracic spine.
No acute osseous findings.
IMPRESSION: Minimal bronchitic changes.

## 2011-03-23 MED ORDER — ALBUTEROL SULFATE (5 MG/ML) 0.5% IN NEBU
2.5000 mg | INHALATION_SOLUTION | Freq: Once | RESPIRATORY_TRACT | Status: AC
  Administered 2011-03-23: 2.5 mg via RESPIRATORY_TRACT

## 2011-03-24 NOTE — Procedures (Signed)
Margaret Mcdaniel, Margaret Mcdaniel                ACCOUNT NO.:  [id number]  MEDICAL RECORD NO.:  [id number]  LOCATION:                                 FACILITY:  PHYSICIAN:  Aidah Forquer L. Juanetta Gosling, M.D.DATE OF BIRTH:  Dec 11, 1950  DATE OF PROCEDURE: DATE OF DISCHARGE:                           PULMONARY FUNCTION TEST   Reason for pulmonary function testing is COPD. 1. Spirometry shows no ventilatory defect, but does show evidence of     airflow obstruction. 2. Lung volumes are normal. 3. DLCO is mildly reduced. 4. Arterial blood gas shows resting hypoxia. 5. There is no significant bronchodilator improvement. 6. This study is consistent with COPD.     Marciano Mundt L. Juanetta Gosling, M.D.     EL[facility]/MEDQ  D:  03/24/2011  T:  03/24/2011  Job:  [id number]

## 2011-11-25 ENCOUNTER — Other Ambulatory Visit (HOSPITAL_COMMUNITY): Payer: Self-pay | Admitting: Pulmonary Disease

## 2011-11-25 ENCOUNTER — Ambulatory Visit (HOSPITAL_COMMUNITY)
Admission: RE | Admit: 2011-11-25 | Discharge: 2011-11-25 | Disposition: A | Discharge status: Home / Self Care | Payer: Medicare Other | Source: Ambulatory Visit | Attending: Pulmonary Disease | Admitting: Pulmonary Disease

## 2011-11-25 DIAGNOSIS — J449 Chronic obstructive pulmonary disease, unspecified: Secondary | ICD-10-CM | POA: Insufficient documentation

## 2011-11-25 DIAGNOSIS — F17200 Nicotine dependence, unspecified, uncomplicated: Secondary | ICD-10-CM | POA: Insufficient documentation

## 2011-11-25 DIAGNOSIS — J4489 Other specified chronic obstructive pulmonary disease: Secondary | ICD-10-CM | POA: Insufficient documentation

## 2011-11-25 DIAGNOSIS — R079 Chest pain, unspecified: Secondary | ICD-10-CM | POA: Insufficient documentation

## 2011-11-25 DIAGNOSIS — R918 Other nonspecific abnormal finding of lung field: Secondary | ICD-10-CM | POA: Insufficient documentation

## 2011-11-25 IMAGING — CR DG CHEST 2V
2 series · 2 of 2 positions shown · non-contrast
Comparison: [DATE]

CLINICAL DATA: COPD, left anterior chest pain, emphysema, smoker

CHEST - 2 VIEW

[view not recorded (1 of 2)]
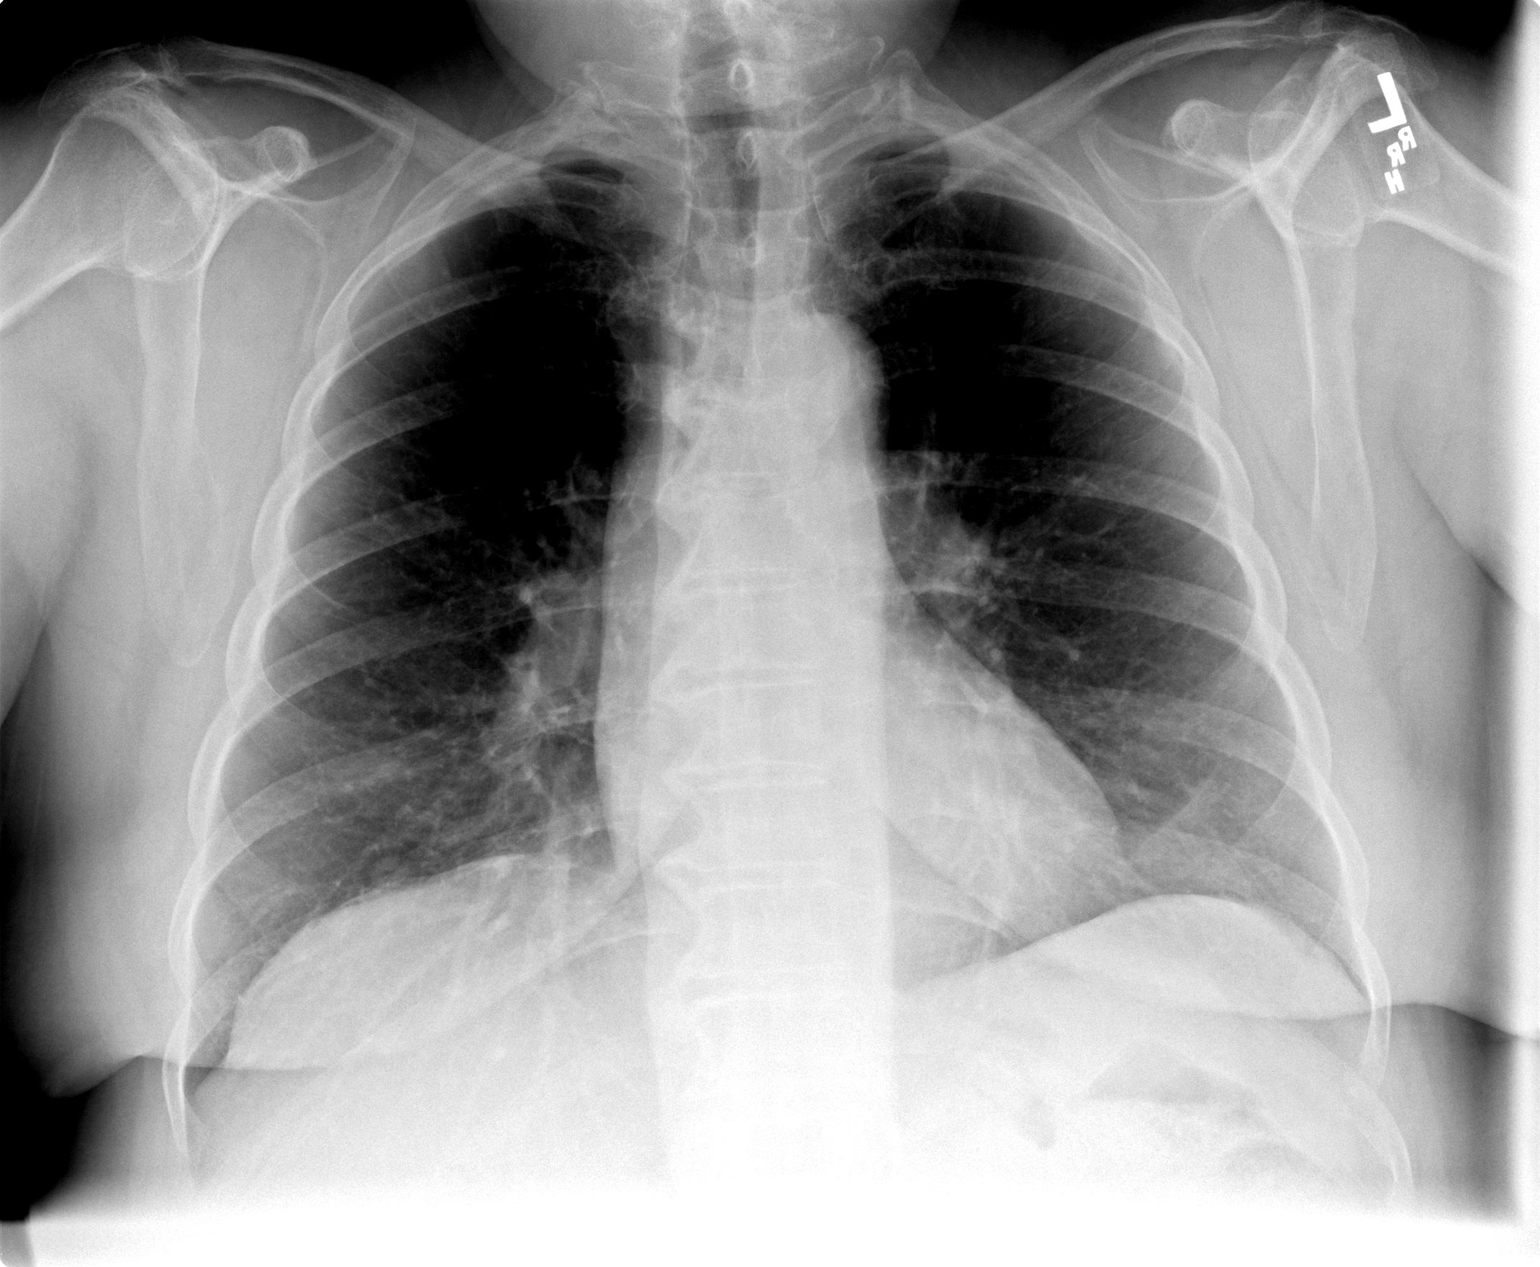

[view not recorded (2 of 2)]
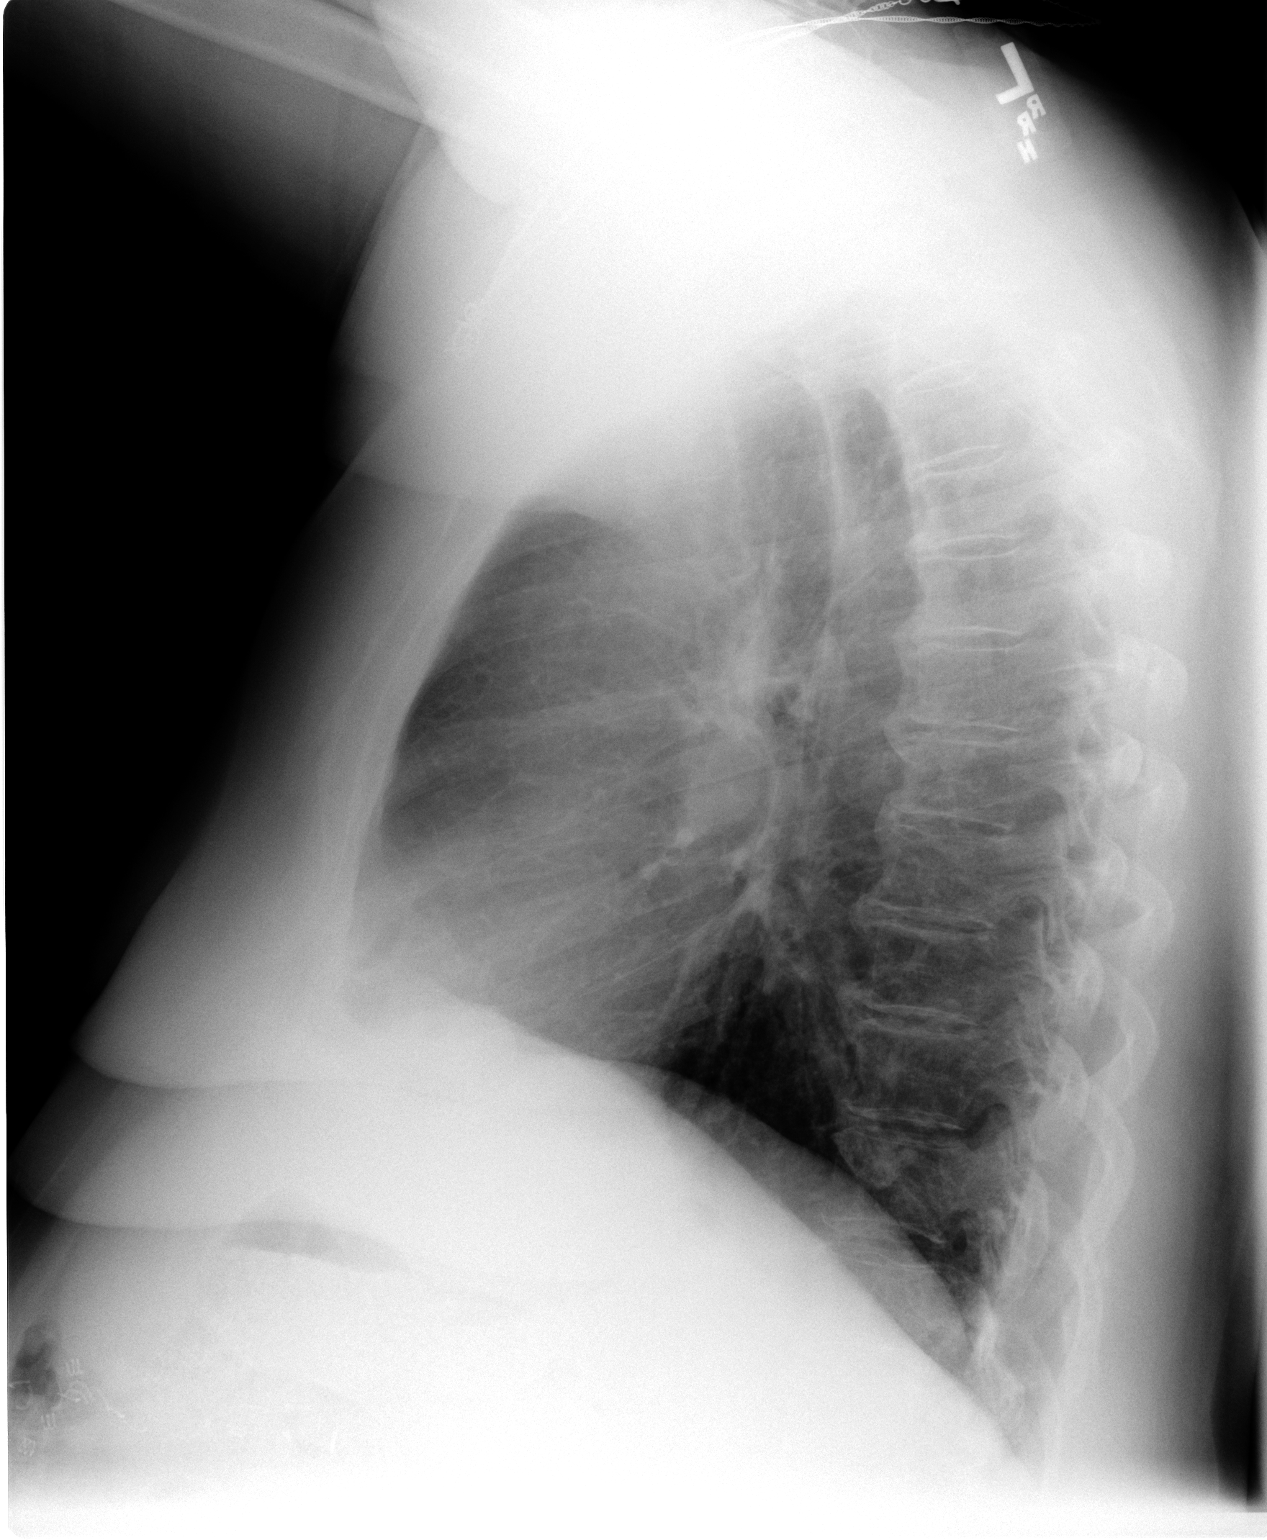

[2 of 2 positions shown; findings below may reference images not displayed]

FINDINGS: Normal heart size, mediastinal contours, and pulmonary vascularity.
Lungs are mildly emphysematous but clear.
No pleural effusion or pneumothorax.
Question nodular density at right base, 11 mm diameter.
Scattered end plate spur formation thoracic spine.
IMPRESSION: Mild emphysematous changes.
Question 11 mm diameter nodule at the right lung base; recommend CT
chest to exclude pulmonary nodule.

## 2011-12-21 ENCOUNTER — Other Ambulatory Visit (HOSPITAL_COMMUNITY): Payer: Self-pay | Admitting: Pulmonary Disease

## 2011-12-21 DIAGNOSIS — R911 Solitary pulmonary nodule: Secondary | ICD-10-CM

## 2011-12-24 ENCOUNTER — Other Ambulatory Visit (HOSPITAL_COMMUNITY): Payer: Medicare Other

## 2011-12-27 ENCOUNTER — Ambulatory Visit (HOSPITAL_COMMUNITY)
Admission: RE | Admit: 2011-12-27 | Discharge: 2011-12-27 | Disposition: A | Discharge status: Home / Self Care | Payer: Medicare Other | Source: Ambulatory Visit | Attending: Pulmonary Disease | Admitting: Pulmonary Disease

## 2011-12-27 DIAGNOSIS — R911 Solitary pulmonary nodule: Secondary | ICD-10-CM | POA: Insufficient documentation

## 2011-12-27 IMAGING — CT CT CHEST W/ CM
2 of 6 series · 14 of 36 positions shown, 18 images · IV contrast (Omnipaque 300)
Comparison: None
Correlation:  Chest radiograph [DATE], abdominal CT [DATE]

CLINICAL DATA: Abnormal chest x-ray, question right lung base lung
nodule

CT CHEST WITH CONTRAST
TECHNIQUE: Multidetector CT imaging of the chest was performed
following the standard protocol during bolus administration of
intravenous contrast. Sagittal and coronal MPR images reconstructed
from axial data set.
Contrast: 80mL OMNIPAQUE IOHEXOL 300 MG/ML  SOLN

[Series 2: chestroutine 5.0 b40f · axial · 0.72mm/px · z∈[-328,-84]mm · 13 of 55 slices shown, 17 images]
[im 3/55  mediastinal]
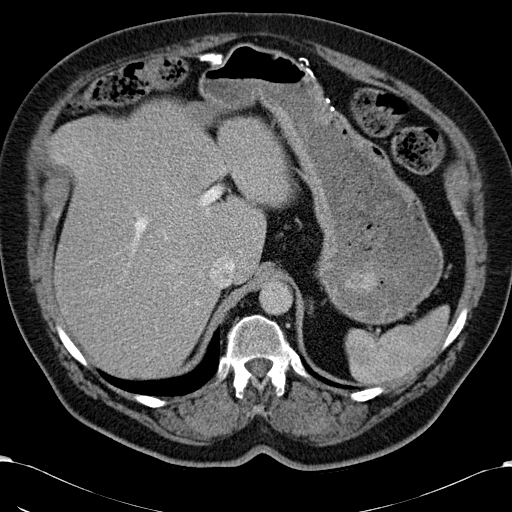
[im 3/55  lung]
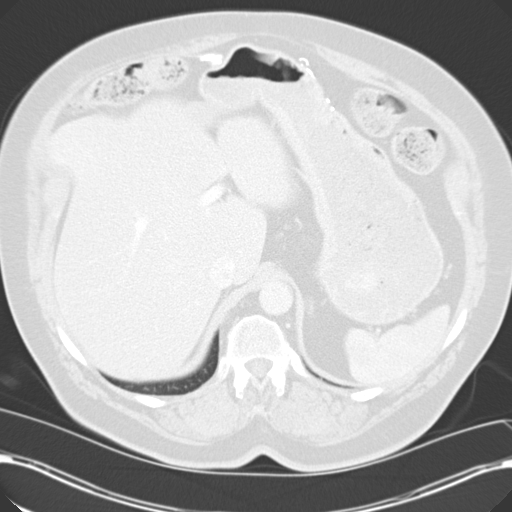
[im 9/55  lung]
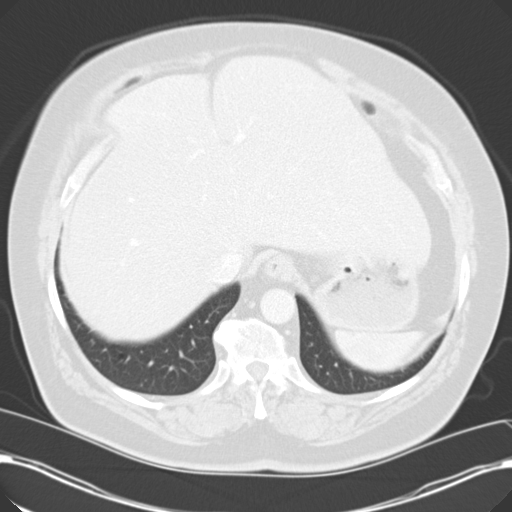
[im 11/55  lung]
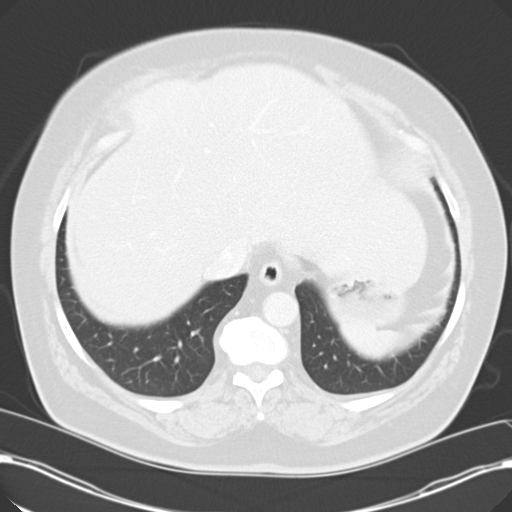
[im 17/55  lung]
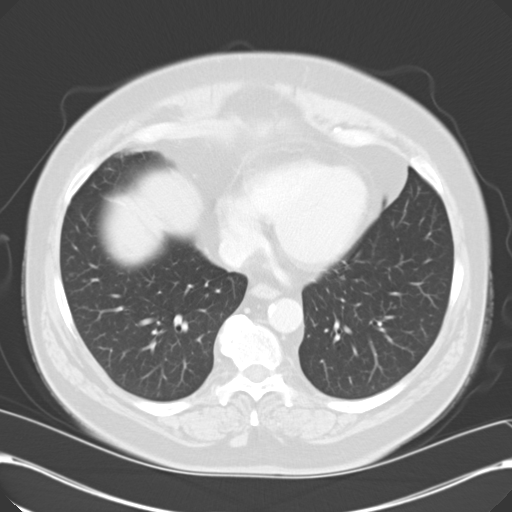
[im 19/55  mediastinal]
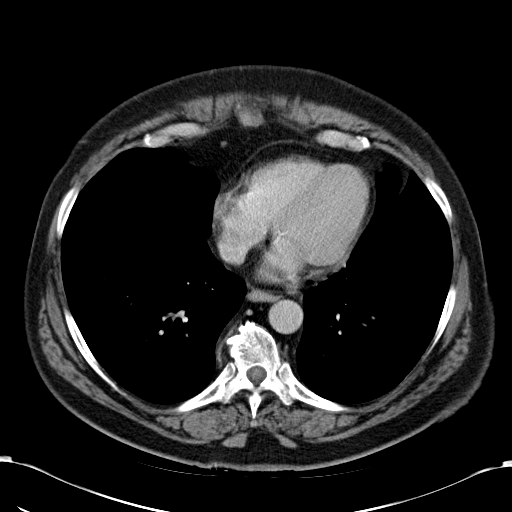
[im 19/55  lung]
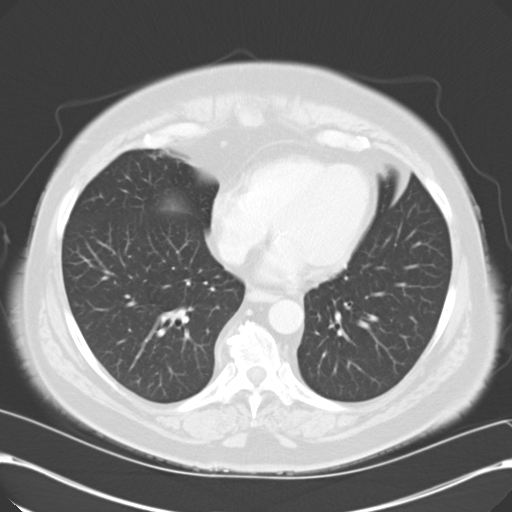
[im 25/55  lung]
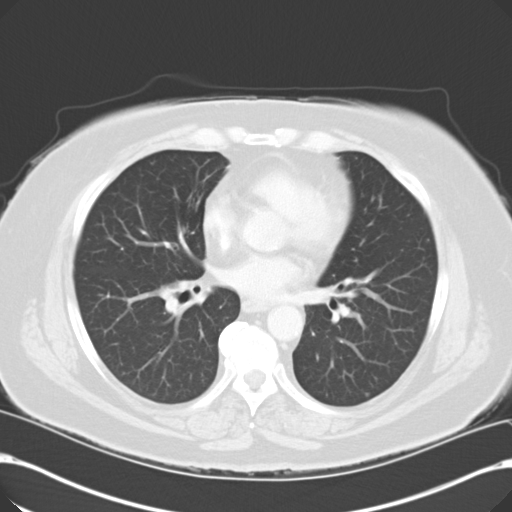
[im 28/55  lung]
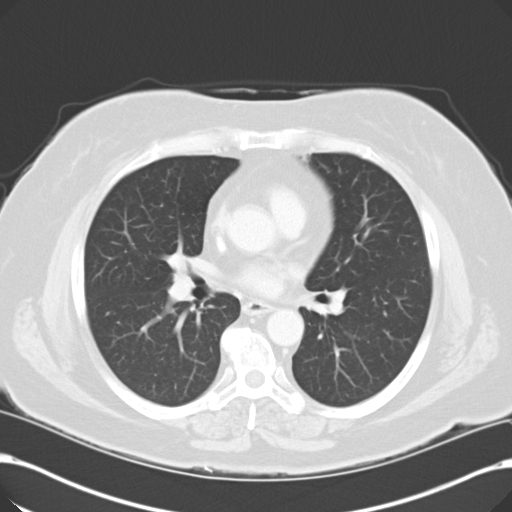
[im 30/55  lung]
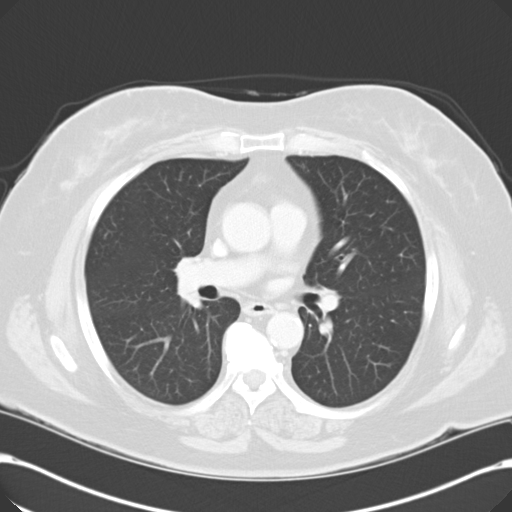
[im 36/55  mediastinal]
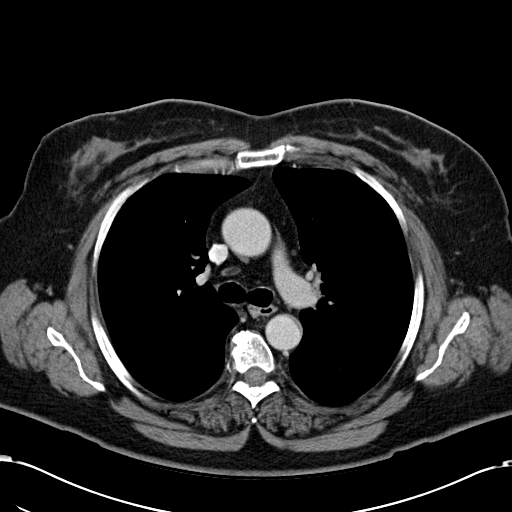
[im 36/55  lung]
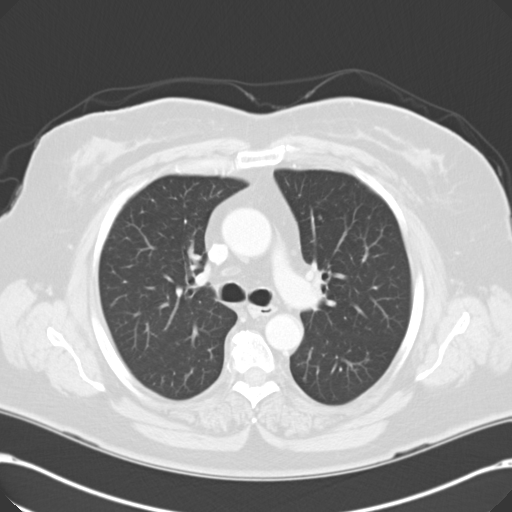
[im 38/55  lung]
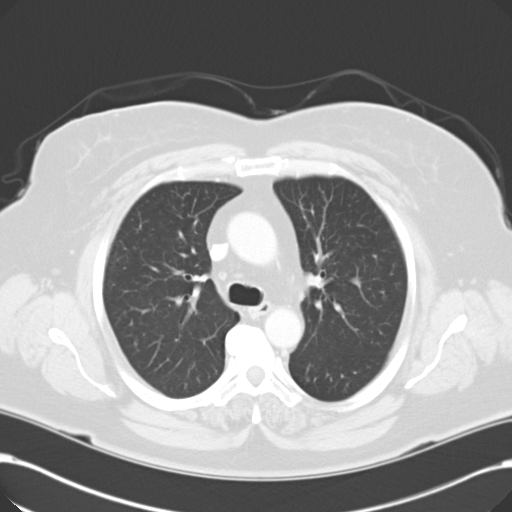
[im 44/55  lung]
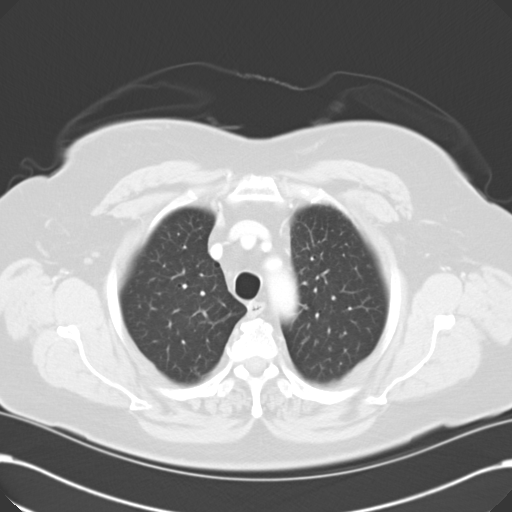
[im 46/55  lung]
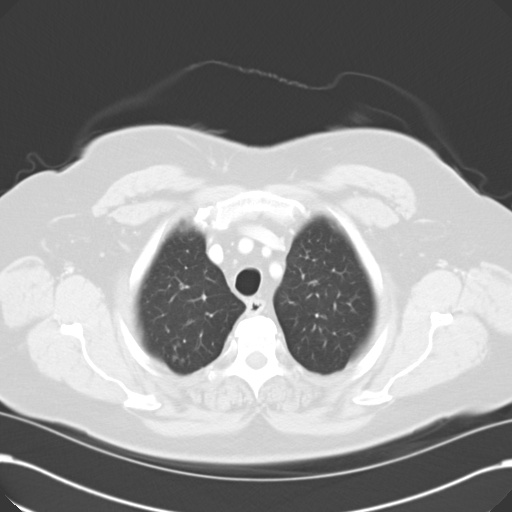
[im 52/55  mediastinal]
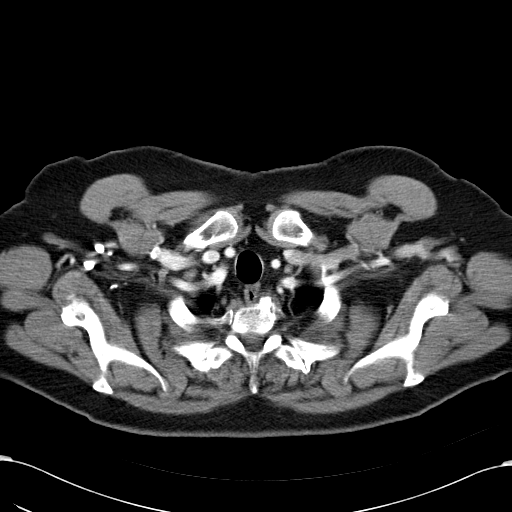
[im 52/55  lung]
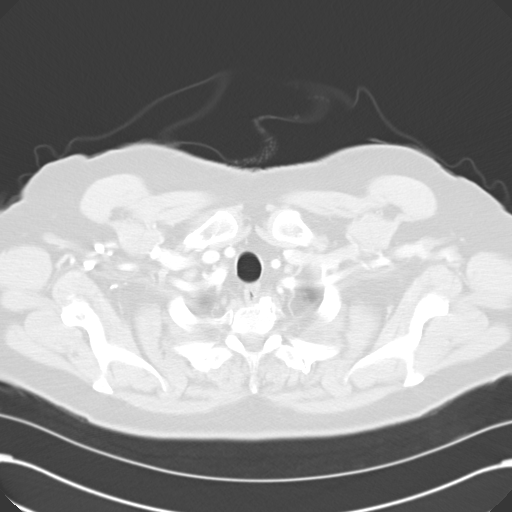

[Series 4: mpr coronal chest 3mm · coronal · 0.55mm/px · 1 of 105 slices shown]
[im 53/105  lung]
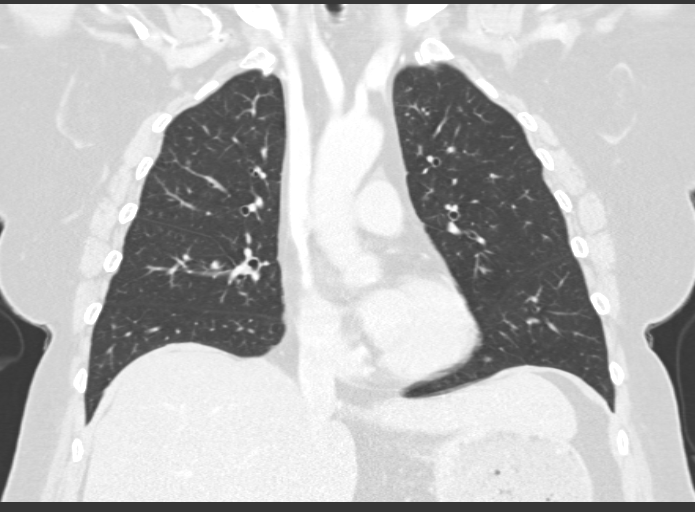

[14 of 36 positions shown; findings below may reference images not displayed]

FINDINGS: Mild scattered atherosclerotic calcification aorta without
aneurysm.
Surgical clips at gallbladder fossa and adjacent to stomach.
Additionally, linear densities identified at the anterior abdominal
wall in the upper abdomen question prior mesh ventral hernia
repair.
Focal fluid collection is seen immediately posterior to this
density, 14.4 cm transverse and 2.2 cm AP, extending at least
cm length beyond the imaged field, decreased in size since prior CT
likely a sterile postoperative collection.
Remaining visualized portion of upper abdomen unremarkable.
No thoracic adenopathy.
Lungs clear.
No pleural effusion or pneumothorax.
Several vague questionable nodular foci noted in the periphery of
the left lung.
No right lung pulmonary nodule is seen to account for the observed
radiographic finding.
Multilevel endplate spur formation thoracic spine.
IMPRESSION: No right lung abnormalities are identified at this site of
radiographic concern; the questioned nodular density on prior chest
radiograph either represented a summation artifact or an external
artifact.
Tiny 2-3 mm diameter nodular foci in periphery of left lung,
recommendation below.
Fluid collection identified posterior to a linear density question
prior mesh ventral hernia repair in the upper abdomen; as this has
decreased in size since [DATE] this likely represents a sterile
postoperative collection.

If the patient is at high risk for bronchogenic carcinoma, follow-
up chest CT at 1 year is recommended.  If the patient is at low
risk, no follow-up is needed.  This recommendation follows the
consensus statement: Guidelines for Management of Small Pulmonary
Nodules Detected on CT Scans:  A Statement from the [facility]

## 2011-12-27 MED ORDER — IOHEXOL 300 MG/ML  SOLN
80.0000 mL | Freq: Once | INTRAMUSCULAR | Status: AC | PRN
  Administered 2011-12-27: 80 mL via INTRAVENOUS

## 2012-07-14 ENCOUNTER — Ambulatory Visit (HOSPITAL_COMMUNITY)
Admission: AD | Admit: 2012-07-14 | Discharge: 2012-07-14 | Disposition: A | Discharge status: Home / Self Care | Payer: No Typology Code available for payment source | Attending: Psychiatry | Admitting: Psychiatry

## 2012-07-14 ENCOUNTER — Encounter (HOSPITAL_COMMUNITY): Payer: Self-pay | Admitting: Emergency Medicine

## 2012-07-14 ENCOUNTER — Emergency Department (HOSPITAL_COMMUNITY)
Admission: EM | Admit: 2012-07-14 | Discharge: 2012-07-15 | Disposition: A | Discharge status: Home / Self Care | Payer: Medicare Other | Attending: Emergency Medicine | Admitting: Emergency Medicine

## 2012-07-14 ENCOUNTER — Encounter (HOSPITAL_COMMUNITY): Payer: Self-pay | Admitting: Licensed Clinical Social Worker

## 2012-07-14 DIAGNOSIS — F3113 Bipolar disorder, current episode manic without psychotic features, severe: Secondary | ICD-10-CM | POA: Insufficient documentation

## 2012-07-14 DIAGNOSIS — J4489 Other specified chronic obstructive pulmonary disease: Secondary | ICD-10-CM | POA: Insufficient documentation

## 2012-07-14 DIAGNOSIS — E119 Type 2 diabetes mellitus without complications: Secondary | ICD-10-CM | POA: Insufficient documentation

## 2012-07-14 DIAGNOSIS — J449 Chronic obstructive pulmonary disease, unspecified: Secondary | ICD-10-CM | POA: Insufficient documentation

## 2012-07-14 DIAGNOSIS — F319 Bipolar disorder, unspecified: Secondary | ICD-10-CM | POA: Insufficient documentation

## 2012-07-14 DIAGNOSIS — Z7982 Long term (current) use of aspirin: Secondary | ICD-10-CM | POA: Insufficient documentation

## 2012-07-14 DIAGNOSIS — Z91040 Latex allergy status: Secondary | ICD-10-CM | POA: Insufficient documentation

## 2012-07-14 DIAGNOSIS — Z79899 Other long term (current) drug therapy: Secondary | ICD-10-CM | POA: Insufficient documentation

## 2012-07-14 HISTORY — DX: Chronic obstructive pulmonary disease, unspecified: J44.9

## 2012-07-14 HISTORY — DX: Mental disorder, not otherwise specified: F99

## 2012-07-14 HISTORY — DX: Sleep apnea, unspecified: G47.30

## 2012-07-14 HISTORY — DX: Type 2 diabetes mellitus without complications: E11.9

## 2012-07-14 LAB — RAPID URINE DRUG SCREEN, HOSP PERFORMED
Amphetamines: NOT DETECTED
Benzodiazepines: NOT DETECTED
Opiates: NOT DETECTED

## 2012-07-14 LAB — ETHANOL: Alcohol, Ethyl (B): <11 mg/dL (ref 0–11)

## 2012-07-14 LAB — URINALYSIS, ROUTINE W REFLEX MICROSCOPIC
Glucose, UA: NEGATIVE mg/dL
Hgb urine dipstick: NEGATIVE
Leukocytes, UA: NEGATIVE
Specific Gravity, Urine: 1.015 (ref 1.005–1.030)
Urobilinogen, UA: 0.2 mg/dL (ref 0.0–1.0)

## 2012-07-14 LAB — CBC WITH DIFFERENTIAL/PLATELET
Basophils Absolute: 0 10*3/uL (ref 0.0–0.1)
Basophils Relative: 0 % (ref 0–1)
Eosinophils Absolute: 0.2 10*3/uL (ref 0.0–0.7)
Eosinophils Relative: 1 % (ref 0–5)
Lymphocytes Relative: 36 % (ref 12–46)
MCH: 27.5 pg (ref 26.0–34.0)
MCHC: 33.1 g/dL (ref 30.0–36.0)
MCV: 83.2 fL (ref 78.0–100.0)
Platelets: 362 10*3/uL (ref 150–400)
RDW: 16.2 % — ABNORMAL HIGH (ref 11.5–15.5)
WBC: 22.1 10*3/uL — ABNORMAL HIGH (ref 4.0–10.5)

## 2012-07-14 LAB — COMPREHENSIVE METABOLIC PANEL
AST: 21 U/L (ref 0–37)
Albumin: 4.1 g/dL (ref 3.5–5.2)
Calcium: 10.3 mg/dL (ref 8.4–10.5)
Creatinine, Ser: 1.14 mg/dL — ABNORMAL HIGH (ref 0.50–1.10)

## 2012-07-14 MED ORDER — BUPROPION HCL ER (XL) 300 MG PO TB24
300.0000 mg | ORAL_TABLET | Freq: Every day | ORAL | Status: DC
  Filled 2012-07-14: qty 1

## 2012-07-14 MED ORDER — TOPIRAMATE 25 MG PO TABS: 100.0000 mg | ORAL_TABLET | Freq: Every day | ORAL | Status: DC

## 2012-07-14 MED ORDER — LORATADINE 10 MG PO TABS
10.0000 mg | ORAL_TABLET | Freq: Every day | ORAL | Status: DC
  Filled 2012-07-14: qty 1

## 2012-07-14 MED ORDER — RAMIPRIL 5 MG PO CAPS
5.0000 mg | ORAL_CAPSULE | Freq: Every day | ORAL | Status: DC
  Filled 2012-07-14: qty 1

## 2012-07-14 MED ORDER — QUETIAPINE FUMARATE 100 MG PO TABS
100.0000 mg | ORAL_TABLET | Freq: Every day | ORAL | Status: DC
  Filled 2012-07-14: qty 1

## 2012-07-14 MED ORDER — PANTOPRAZOLE SODIUM 40 MG PO TBEC: 40.0000 mg | DELAYED_RELEASE_TABLET | Freq: Every day | ORAL | Status: DC

## 2012-07-14 MED ORDER — ROPINIROLE HCL 1 MG PO TABS
1.0000 mg | ORAL_TABLET | Freq: Every day | ORAL | Status: DC
  Filled 2012-07-14 (×2): qty 1

## 2012-07-14 MED ORDER — NIACIN 500 MG PO TABS
1000.0000 mg | ORAL_TABLET | Freq: Every day | ORAL | Status: DC
  Filled 2012-07-14 (×2): qty 2

## 2012-07-14 MED ORDER — FUROSEMIDE 40 MG PO TABS
40.0000 mg | ORAL_TABLET | Freq: Every day | ORAL | Status: DC
  Filled 2012-07-14: qty 1

## 2012-07-14 MED ORDER — ASPIRIN EC 81 MG PO TBEC
81.0000 mg | DELAYED_RELEASE_TABLET | Freq: Every day | ORAL | Status: DC
  Filled 2012-07-14: qty 1

## 2012-07-14 MED ORDER — GLIPIZIDE 10 MG PO TABS
10.0000 mg | ORAL_TABLET | Freq: Every day | ORAL | Status: DC
  Filled 2012-07-14 (×2): qty 1

## 2012-07-14 MED ORDER — TIOTROPIUM BROMIDE MONOHYDRATE 18 MCG IN CAPS
18.0000 ug | ORAL_CAPSULE | Freq: Every day | RESPIRATORY_TRACT | Status: DC
  Filled 2012-07-14: qty 5

## 2012-07-14 MED ORDER — SIMVASTATIN 40 MG PO TABS
40.0000 mg | ORAL_TABLET | Freq: Every day | ORAL | Status: DC
  Filled 2012-07-14 (×2): qty 1

## 2012-07-14 MED ORDER — METFORMIN HCL ER 500 MG PO TB24
1000.0000 mg | ORAL_TABLET | Freq: Two times a day (BID) | ORAL | Status: DC
  Filled 2012-07-14 (×3): qty 2
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
## 2012-07-14 NOTE — ED Notes (Signed)
Telepsych request faxed

## 2012-07-15 ENCOUNTER — Emergency Department (HOSPITAL_COMMUNITY): Payer: Medicare Other

## 2012-07-15 IMAGING — CR DG ANKLE COMPLETE 3+V*R*
3 series · 3 of 3 positions shown · non-contrast
Comparison: None.

CLINICAL DATA: Generalized right ankle pain.  Prior fracture and
fusion.

RIGHT ANKLE - COMPLETE 3+ VIEW

[x ankle ap right]
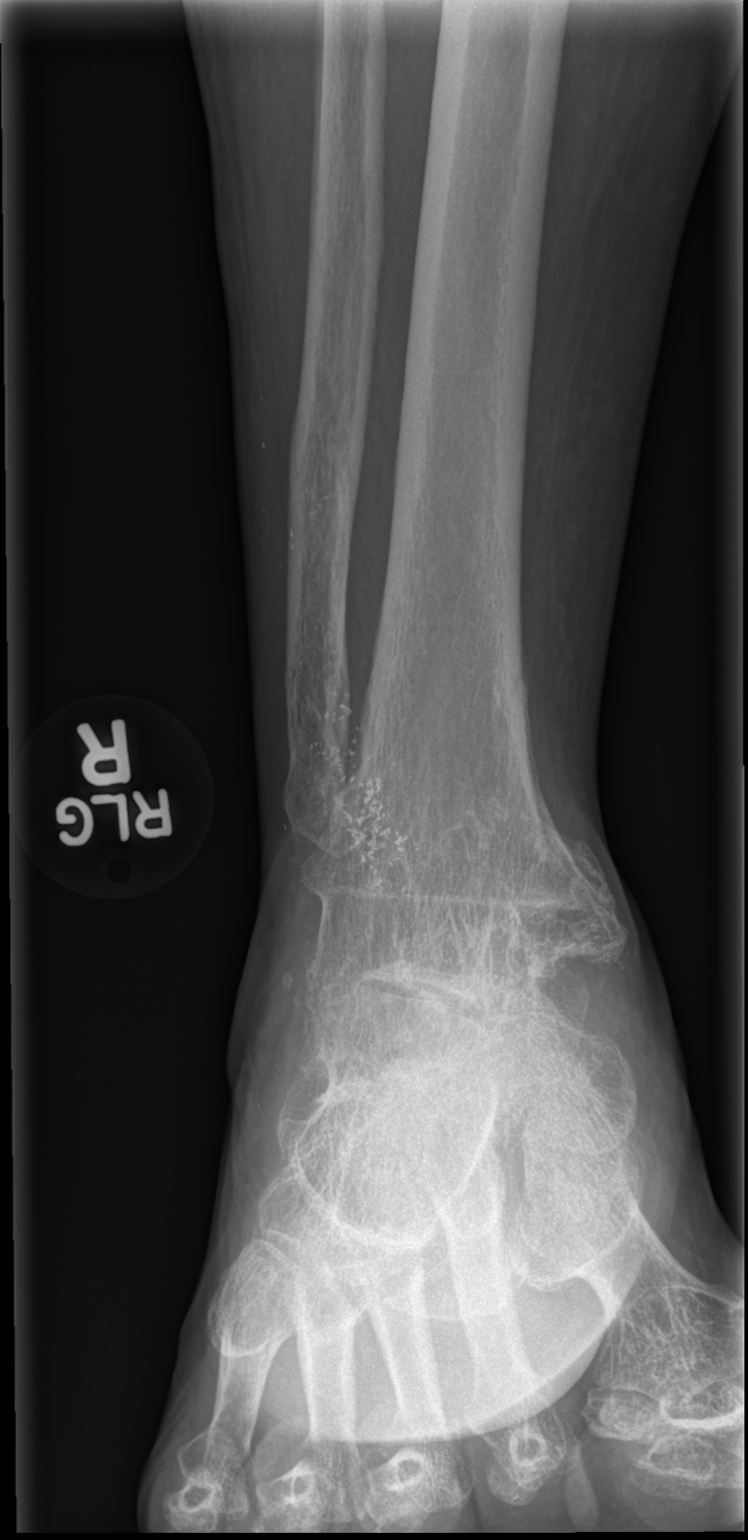

[x ankle obl right]
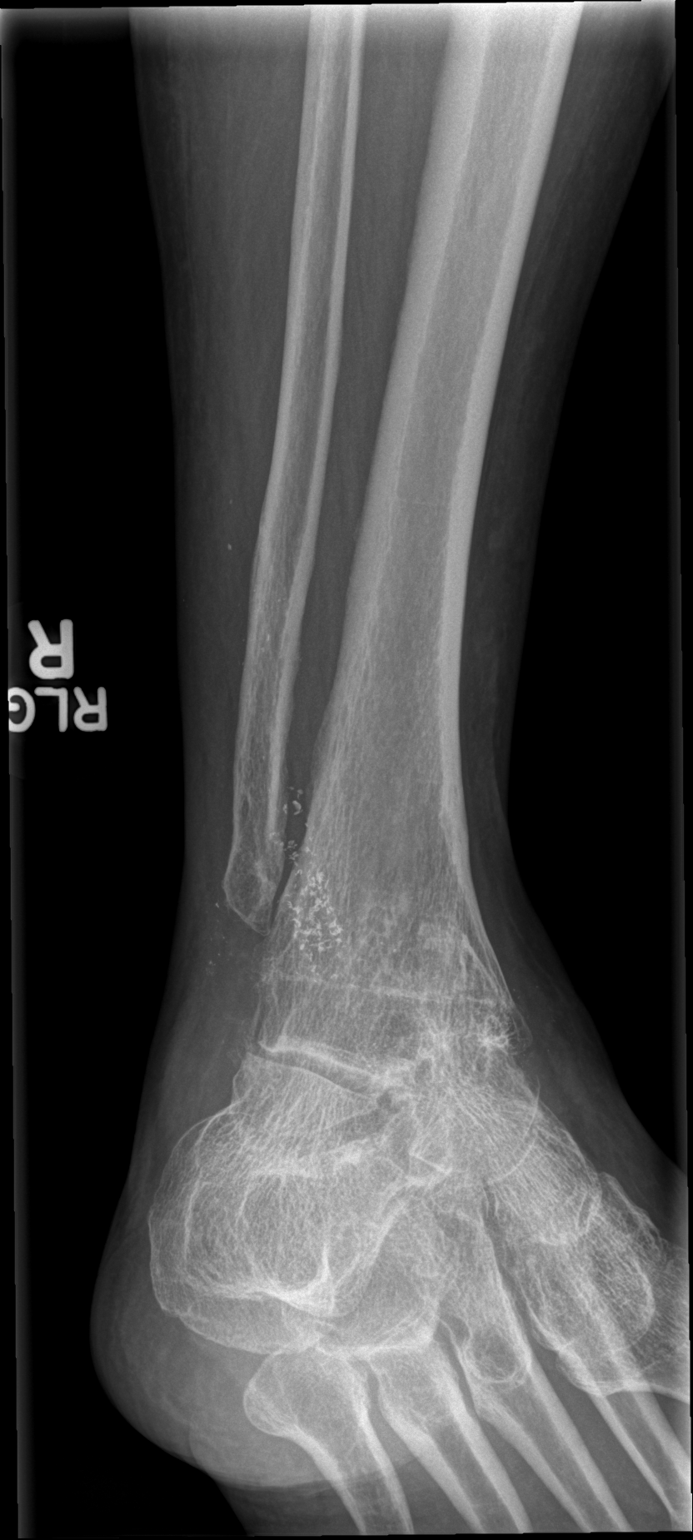

[x ankle lat right]
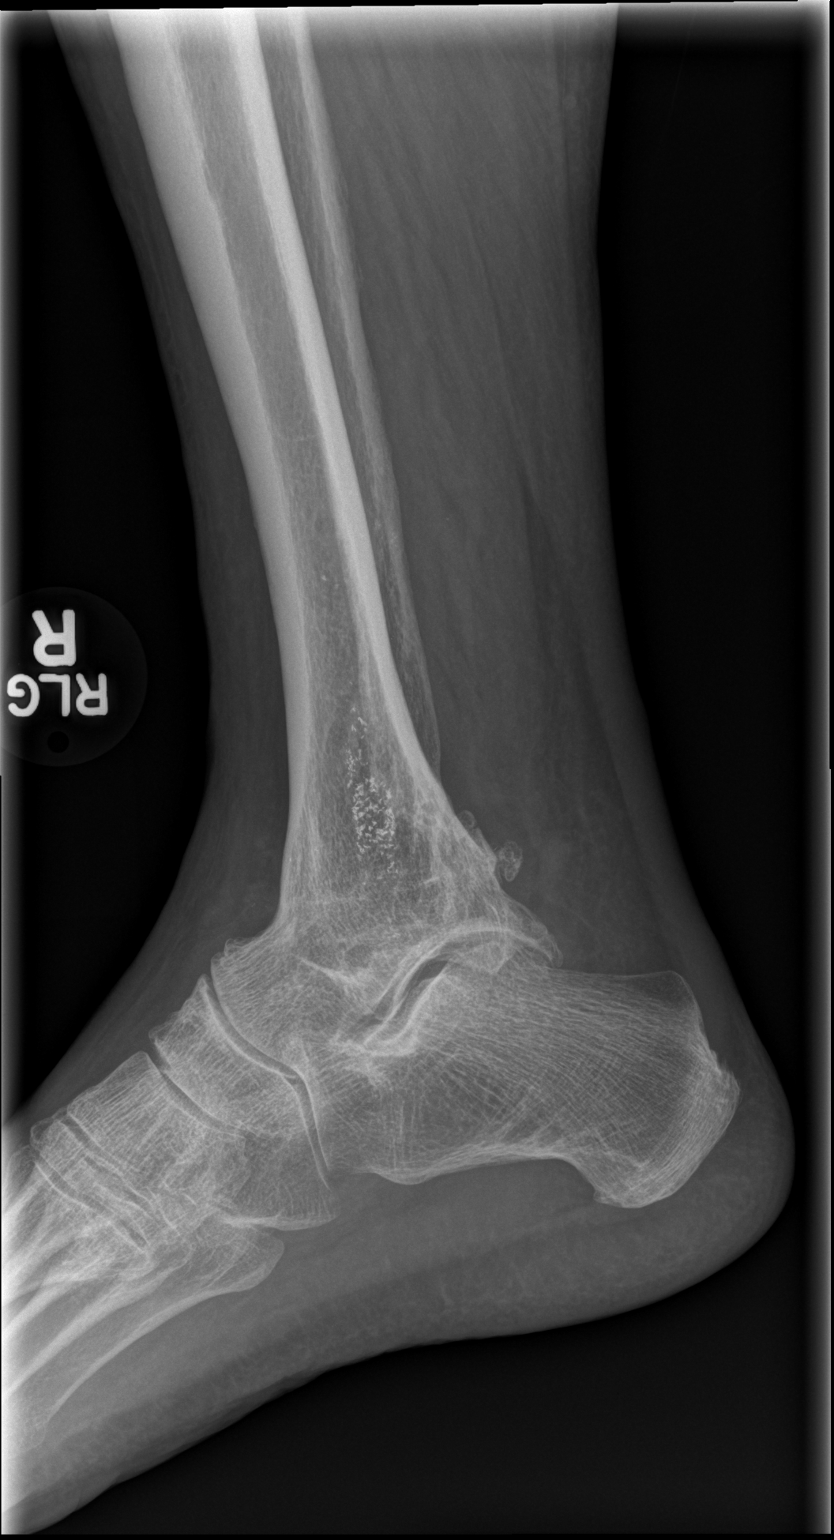

[3 of 3 positions shown; findings below may reference images not displayed]

FINDINGS: Prior tibiotalar solid bony fusion observed.  The distal
fibula tapers with minimal if any articulation with the tibia. A
cylindrical track of small high density particles is noted
projecting along the distal anterior tibiofibular margin.

Possible fusion of the anterior subtalar joint.  Prominent spurring
along the posterior subtalar facet.  Small plantar calcaneal spur.
No fracture observed.  Trace edema in Kager's fat pad.
IMPRESSION: 1.  Solid bony tibiotalar fusion.  Distal fibula resected or
resorbed.
2.  Cannot exclude fusion of the anterior subtalar joint.
Prominent degenerative spurring along the posterior subtalar facet.
3.  Small plantar calcaneal spur.

## 2012-07-15 MED ORDER — IBUPROFEN 200 MG PO TABS
400.0000 mg | ORAL_TABLET | Freq: Once | ORAL | Status: AC
  Administered 2012-07-15: 400 mg via ORAL
  Filled 2012-07-15: qty 2

## 2012-07-15 MED ORDER — ACETAMINOPHEN 325 MG PO TABS
650.0000 mg | ORAL_TABLET | Freq: Once | ORAL | Status: AC
  Administered 2012-07-15: 650 mg via ORAL
  Filled 2012-07-15: qty 2

## 2012-07-15 MED ORDER — LORAZEPAM 1 MG PO TABS
1.0000 mg | ORAL_TABLET | Freq: Once | ORAL | Status: AC
  Administered 2012-07-15: 1 mg via ORAL
  Filled 2012-07-15: qty 1
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
## 2012-07-15 NOTE — ED Notes (Signed)
Called pt's son for ride home, is on way now

## 2012-07-15 NOTE — ED Notes (Signed)
Pt tx from triage to main Ed awaiting teley psy, v/s stable, denies suicidal, thoughts, or heading voices, no c/o pain will monitor.

## 2012-07-15 NOTE — ED Provider Notes (Signed)
Pt stable, awaiting full telepsych eval Reports HA, but thinks it may be from not having her home oxygen and cpap on  No fever/weakness She also reports right ankle pain , does not recall injury Will obtain ankle xray No calf tenderness/edema noted, distal pulses intact Otherwise stable  Joya Gaskins, MD 07/15/12 [phone]

## 2012-07-15 NOTE — ED Notes (Signed)
Please call son Fabiha Rougeau [phone] when disposition has been decided.

## 2012-07-15 NOTE — ED Provider Notes (Addendum)
8:05 AM pt evaluated in the psych ED. She is awake, and resting comfortably with no current complaints.  She had ankle film this morning which showed no acute process- all degenerative changes in nature.  Pt is currently awaiting telepsych eval.  Based on chart review papers were faxed last night at 10pm  8:13 AM telepsych consult has been obtained, Dr. Jacky Kindle states patient stable for discharge at this time and not in need of psych admission.  Pt was evaluated at [facility] yesterday and sent here for medical clearance and telepsych.  They also stated due to O2 need she could not be admitted to [facility].  But telepsych states she is stable for outpatient management.   Ethelda Chick, MD 07/15/12 1610  Ethelda Chick, MD 07/15/12 [id number]  Ethelda Chick, MD 07/15/12 [phone]

## 2012-07-15 NOTE — ED Notes (Signed)
Pt c/o headache no relief with tylenol, md aware will round on Pt.Will continue to monitor.

## 2012-08-02 ENCOUNTER — Encounter (HOSPITAL_COMMUNITY): Payer: Self-pay | Admitting: *Deleted

## 2012-08-02 ENCOUNTER — Emergency Department (HOSPITAL_COMMUNITY)
Admission: EM | Admit: 2012-08-02 | Discharge: 2012-08-02 | Disposition: A | Discharge status: Home / Self Care | Payer: Medicare Other | Attending: Emergency Medicine | Admitting: Emergency Medicine

## 2012-08-02 ENCOUNTER — Emergency Department (HOSPITAL_COMMUNITY): Payer: Medicare Other

## 2012-08-02 DIAGNOSIS — Z79899 Other long term (current) drug therapy: Secondary | ICD-10-CM | POA: Insufficient documentation

## 2012-08-02 DIAGNOSIS — M109 Gout, unspecified: Secondary | ICD-10-CM | POA: Insufficient documentation

## 2012-08-02 DIAGNOSIS — M7989 Other specified soft tissue disorders: Secondary | ICD-10-CM | POA: Insufficient documentation

## 2012-08-02 DIAGNOSIS — Z91040 Latex allergy status: Secondary | ICD-10-CM | POA: Insufficient documentation

## 2012-08-02 DIAGNOSIS — Z8659 Personal history of other mental and behavioral disorders: Secondary | ICD-10-CM | POA: Insufficient documentation

## 2012-08-02 DIAGNOSIS — Z7982 Long term (current) use of aspirin: Secondary | ICD-10-CM | POA: Insufficient documentation

## 2012-08-02 DIAGNOSIS — J4489 Other specified chronic obstructive pulmonary disease: Secondary | ICD-10-CM | POA: Insufficient documentation

## 2012-08-02 DIAGNOSIS — G4730 Sleep apnea, unspecified: Secondary | ICD-10-CM | POA: Insufficient documentation

## 2012-08-02 DIAGNOSIS — E119 Type 2 diabetes mellitus without complications: Secondary | ICD-10-CM | POA: Insufficient documentation

## 2012-08-02 DIAGNOSIS — J449 Chronic obstructive pulmonary disease, unspecified: Secondary | ICD-10-CM | POA: Insufficient documentation

## 2012-08-02 LAB — CBC WITH DIFFERENTIAL/PLATELET
Basophils Relative: 0 % (ref 0–1)
Eosinophils Absolute: 0.2 10*3/uL (ref 0.0–0.7)
HCT: 31.8 % — ABNORMAL LOW (ref 36.0–46.0)
Hemoglobin: 10.5 g/dL — ABNORMAL LOW (ref 12.0–15.0)
MCH: 27.4 pg (ref 26.0–34.0)
MCHC: 33 g/dL (ref 30.0–36.0)
Monocytes Absolute: 1.1 10*3/uL — ABNORMAL HIGH (ref 0.1–1.0)
Monocytes Relative: 9 % (ref 3–12)

## 2012-08-02 LAB — BASIC METABOLIC PANEL
BUN: 21 mg/dL (ref 6–23)
Calcium: 10 mg/dL (ref 8.4–10.5)
Creatinine, Ser: 1.06 mg/dL (ref 0.50–1.10)
GFR calc Af Amer: 63 mL/min — ABNORMAL LOW (ref >90)
GFR calc non Af Amer: 55 mL/min — ABNORMAL LOW (ref >90)

## 2012-08-02 LAB — URINALYSIS, ROUTINE W REFLEX MICROSCOPIC
Bilirubin Urine: NEGATIVE
Ketones, ur: NEGATIVE mg/dL
Nitrite: NEGATIVE
Urobilinogen, UA: 0.2 mg/dL (ref 0.0–1.0)

## 2012-08-02 LAB — TROPONIN I: Troponin I: <0.3 ng/mL (ref <0.30)

## 2012-08-02 IMAGING — CR DG CHEST 2V
2 series · 2 of 2 positions shown · non-contrast
Comparison: [DATE]

CLINICAL DATA: Shortness of breath.  Suspect CHF.

CHEST - 2 VIEW

[view not recorded (1 of 2)]
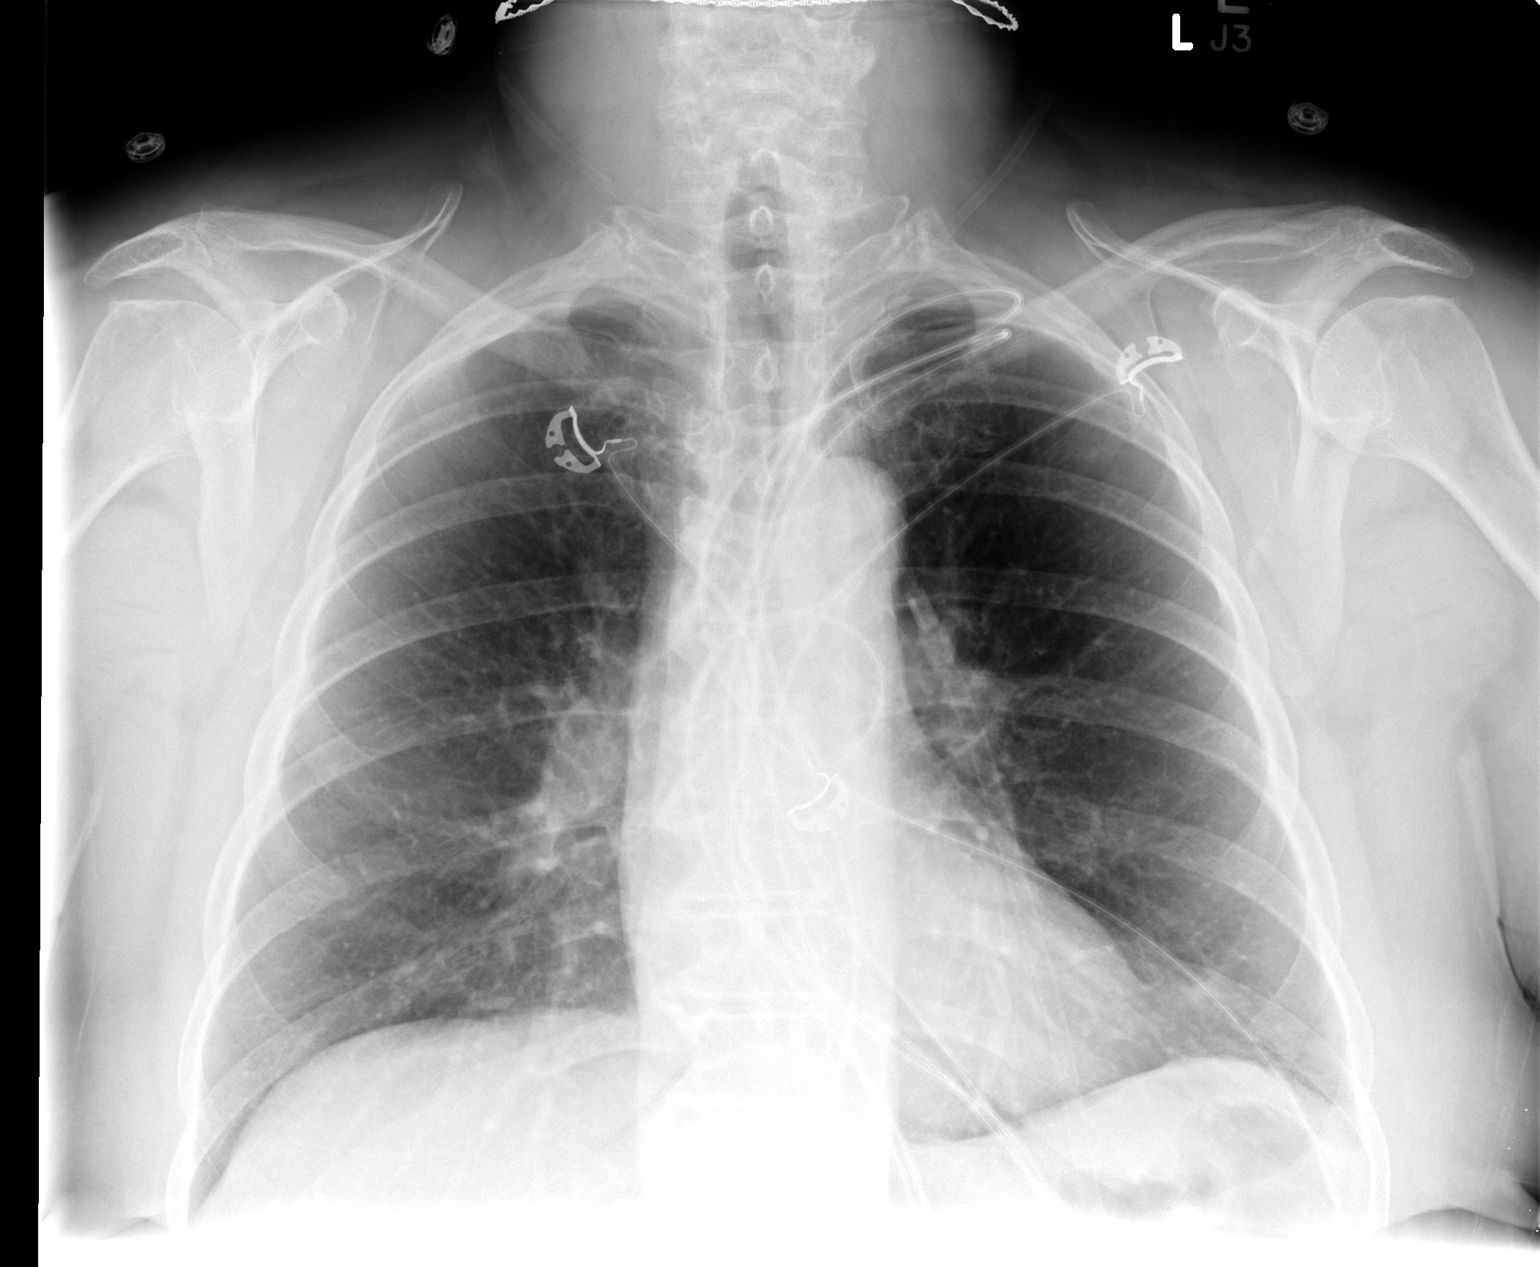

[view not recorded (2 of 2)]
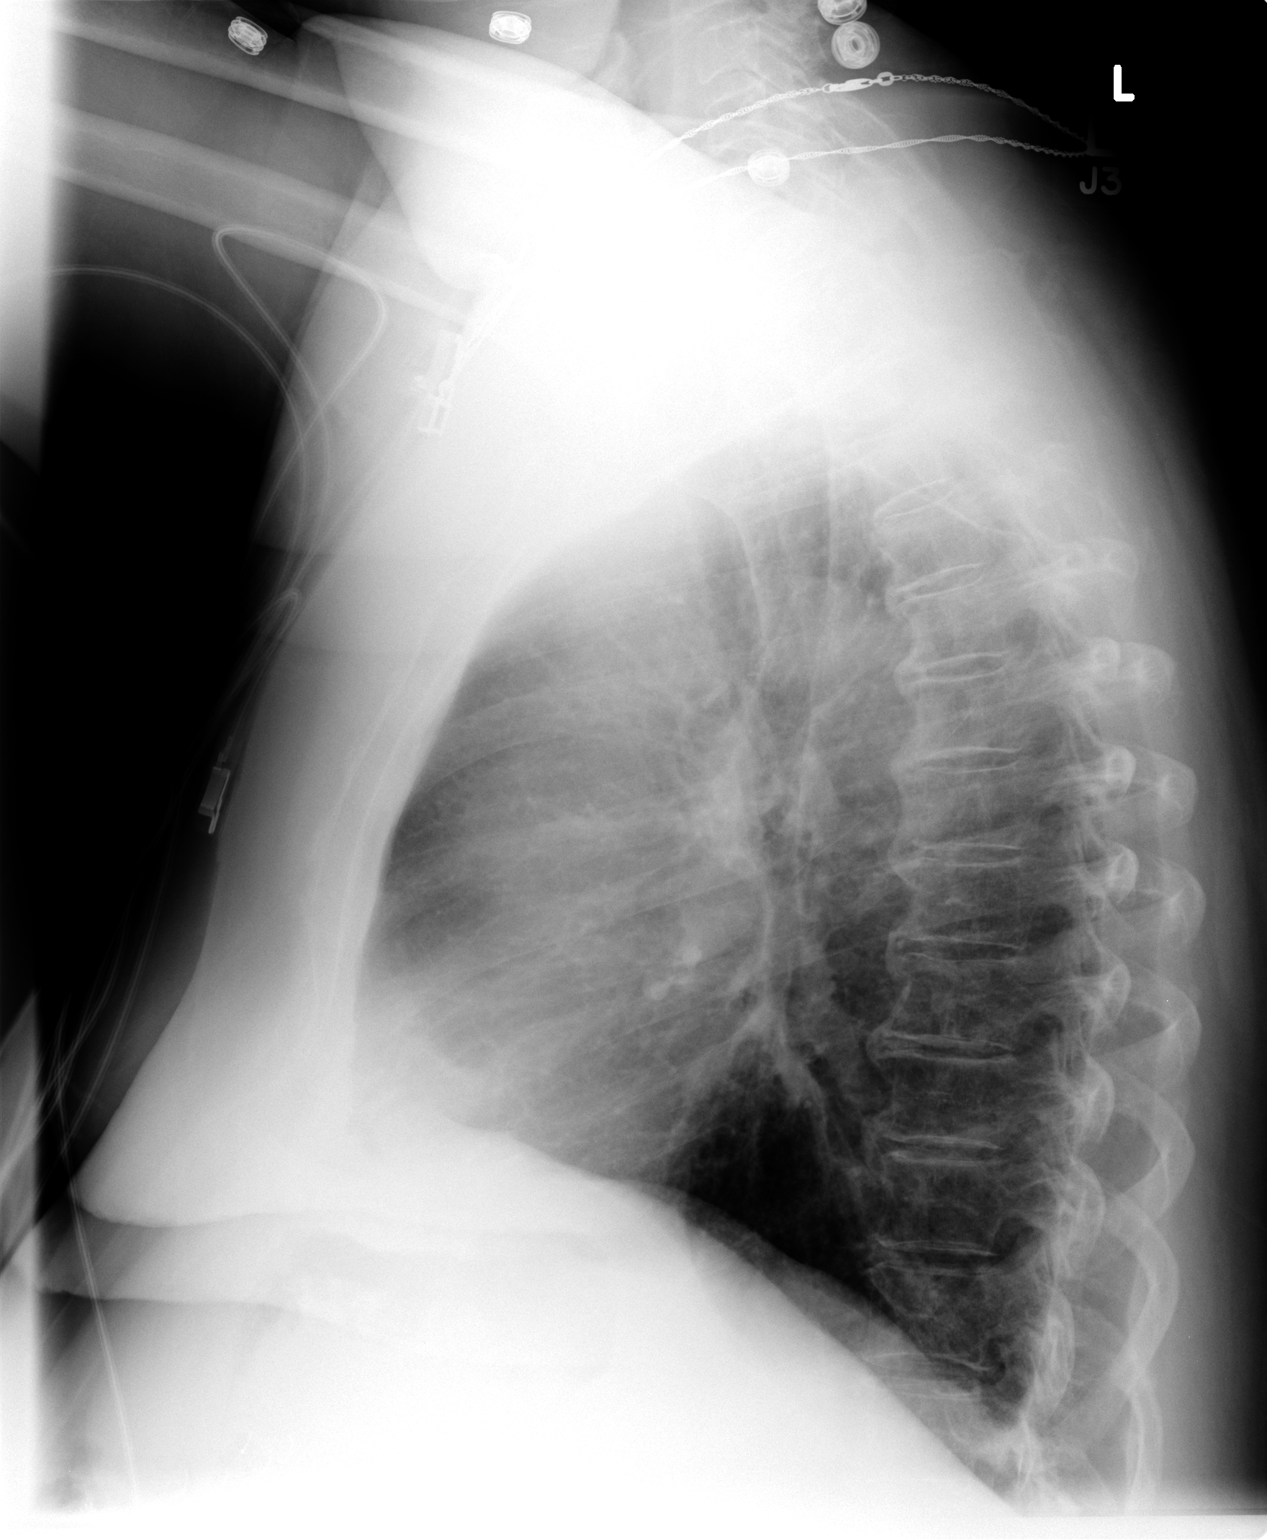

[2 of 2 positions shown; findings below may reference images not displayed]

FINDINGS: Moderate thoracic spondylosis. Midline trachea.  Normal
heart size and mediastinal contours.  Mild right hemidiaphragm
elevation. No pleural effusion or pneumothorax.  Clear lungs.

No congestive failure.
IMPRESSION: No congestive heart failure or acute process.

## 2012-08-02 MED ORDER — HYDROCODONE-ACETAMINOPHEN 5-325 MG PO TABS: 1.0000 | ORAL_TABLET | ORAL | Status: DC | PRN

## 2012-08-02 NOTE — ED Notes (Signed)
Pt states pain to right foot. Seen at urgent care on Friday and Rx of indomethacin, which is not helping. States pain is beginning to go into her other foot.

## 2012-08-02 NOTE — ED Notes (Signed)
Pt states she has been swelling all over and had sob during the night.

## 2012-08-02 NOTE — ED Provider Notes (Signed)
History   This chart was scribed for Margaret Human, MD, MD by Smitty Pluck, ED Scribe. The patient was seen in room APA07/APA07 and the patient's care was started at 3:05 PM.   CSN: [id number]  Arrival date & time 08/02/12  1203    Chief Complaint  Patient presents with  . Shortness of Breath  . Leg Swelling    The history is provided by the patient. No language interpreter was used.    HPI Comments: Margaret Mcdaniel is a 62 y.o. female who presents to the Emergency Department complaining of sudden-onset, intermittent, moderate SOB which occurred 12 hours ago when she turned over onto her right side while sleeping. She states that she has a h/o of SOB due to COPD and emphysema  The pt also states that she feels as if her abdomen is bloated with fluids. Additionally, she complains of swelling in her left foot. She has a previous diagnosis of gout in her left foot, but she states that it is not improving even with a shot of toradol and indomethacin (50 mg, 2 times a day).  She takes lasix daily. Her cbg today was 284. Pt has a history of sleep apnea, mental disorder, and DM without complication. The pt has a h/o of surgeries including rotor cuff surgery, cholecystectomy, presacral surgery, appendectomy, and surgery for hemorrhoids. She is a former smoker, and she denies drinking.   The pt's PCP is Dr. Charm Barges, and the pt. states that she last visited Dr. Charm Barges a month ago. She has another appointment with Dr. Charm Barges at the end of June.  Past Medical History  Diagnosis Date  . COPD (chronic obstructive pulmonary disease)   . Mental disorder   . Sleep apnea   . Diabetes mellitus without complication     Past Surgical History  Procedure Laterality Date  . Ankle surgery    . Appendectomy    . Cholecystectomy    . Exploratory laparotomy    . Tonsillectomy    . Hemorrhoid surgery    . Rotator cuff repair    . Tubal ligation      No family history on file.  History  Substance Use  Topics  . Smoking status: Not on file  . Smokeless tobacco: Not on file  . Alcohol Use: No    No ob history provided.   Review of Systems  A complete 10 system review of systems was obtained and all systems are negative except as noted in the HPI and PMH.   Allergies  Keflex; Sulfa antibiotics; and Latex  Home Medications   Current Outpatient Rx  Name  Route  Sig  Dispense  Refill  . aspirin EC 81 MG tablet   Oral   Take 81 mg by mouth daily.         . benztropine (COGENTIN) 1 MG tablet   Oral   Take 1 mg by mouth at bedtime.         Marland Kitchen buPROPion (WELLBUTRIN XL) 300 MG 24 hr tablet   Oral   Take 300 mg by mouth daily.         . cetirizine (ZYRTEC) 10 MG tablet   Oral   Take 10 mg by mouth daily.         . fish oil-omega-3 fatty acids 1000 MG capsule   Oral   Take 1 g by mouth 2 (two) times daily.         . furosemide (LASIX) 40 MG tablet  Oral   Take 40 mg by mouth daily.         Marland Kitchen glipiZIDE (GLUCOTROL) 10 MG tablet   Oral   Take 10 mg by mouth daily.         . indomethacin (INDOCIN) 50 MG capsule   Oral   Take 50 mg by mouth 2 (two) times daily with a meal.         . metFORMIN (GLUCOPHAGE-XR) 500 MG 24 hr tablet   Oral   Take 1,000 mg by mouth 2 (two) times daily.         . Multiple Vitamin (MULTIVITAMIN WITH MINERALS) TABS   Oral   Take 1 tablet by mouth daily.         . niacin 500 MG tablet   Oral   Take 1,000 mg by mouth at bedtime.         Marland Kitchen omeprazole (PRILOSEC) 40 MG capsule   Oral   Take 40 mg by mouth daily.         . QUEtiapine (SEROQUEL) 100 MG tablet   Oral   Take 100 mg by mouth at bedtime.         . ramipril (ALTACE) 5 MG capsule   Oral   Take 5 mg by mouth daily.         . risperiDONE (RISPERDAL) 2 MG tablet   Oral   Take 2 mg by mouth daily.         . risperidone (RISPERDAL) 4 MG tablet   Oral   Take 4 mg by mouth at bedtime.         Marland Kitchen rOPINIRole (REQUIP) 1 MG tablet   Oral   Take 1 mg  by mouth at bedtime.         . simvastatin (ZOCOR) 40 MG tablet   Oral   Take 40 mg by mouth at bedtime.         Marland Kitchen tiotropium (SPIRIVA) 18 MCG inhalation capsule   Inhalation   Place 18 mcg into inhaler and inhale daily.         Marland Kitchen topiramate (TOPAMAX) 100 MG tablet   Oral   Take 100 mg by mouth at bedtime.           Triage Vitals: BP 136/81  Pulse 93  Temp(Src) 98.5 F (36.9 C) (Oral)  Resp 18  Ht 5\' 4"  (1.626 m)  Wt 190 lb (86.183 kg)  BMI 32.6 kg/m2  SpO2 95%  Physical Exam  Nursing note and vitals reviewed. Constitutional: She is oriented to person, place, and time. She appears well-developed and well-nourished. No distress.  HENT:  Head: Normocephalic and atraumatic.  Right Ear: External ear normal.  Left Ear: External ear normal.  Nose: Nose normal.  Mouth/Throat: Oropharynx is clear and moist.  Eyes: Conjunctivae and EOM are normal. Pupils are equal, round, and reactive to light.  Neck: Normal range of motion. Neck supple. No tracheal deviation present.  Cardiovascular: Normal rate, regular rhythm and normal heart sounds.   Pulmonary/Chest: Effort normal. No respiratory distress. She has rales.  Rales at left base.   Abdominal: Soft. She exhibits distension.  May have ascites.  Mild distension.   Musculoskeletal: Normal range of motion. She exhibits edema.  Edema bilaterally +1 on ankles.  Scar on medial right ankle.   Feels like her right ankle is fused.  On her right great toe, MTP joint, there is redness of skin.   Neurological: She is alert and oriented  to person, place, and time.  Skin: Skin is warm and dry.  Psychiatric: She has a normal mood and affect. Her behavior is normal.    ED Course  Procedures (including critical care time)  DIAGNOSTIC STUDIES: Oxygen Saturation is 99% on room air, normal by my interpretation.    COORDINATION OF CARE:  3:09 PM Discussed ED treatment, which includes testing for CHF using an ECG and chest  x-ray, with pt and pt agrees.    Results for orders placed during the hospital encounter of 08/02/12  GLUCOSE, CAPILLARY      Result Value Range   Glucose-Capillary 284 (*) 70 - 99 mg/dL  CBC WITH DIFFERENTIAL      Result Value Range   WBC 13.4 (*) 4.0 - 10.5 K/uL   RBC 3.83 (*) 3.87 - 5.11 MIL/uL   Hemoglobin 10.5 (*) 12.0 - 15.0 g/dL   HCT 54.0 (*) 98.1 - 19.1 %   MCV 83.0  78.0 - 100.0 fL   MCH 27.4  26.0 - 34.0 pg   MCHC 33.0  30.0 - 36.0 g/dL   RDW 47.8 (*) 29.5 - 62.1 %   Platelets 313  150 - 400 K/uL   Neutrophils Relative % 55  43 - 77 %   Neutro Abs 7.4  1.7 - 7.7 K/uL   Lymphocytes Relative 34  12 - 46 %   Lymphs Abs 4.6 (*) 0.7 - 4.0 K/uL   Monocytes Relative 9  3 - 12 %   Monocytes Absolute 1.1 (*) 0.1 - 1.0 K/uL   Eosinophils Relative 2  0 - 5 %   Eosinophils Absolute 0.2  0.0 - 0.7 K/uL   Basophils Relative 0  0 - 1 %   Basophils Absolute 0.0  0.0 - 0.1 K/uL  BASIC METABOLIC PANEL      Result Value Range   Sodium 141  135 - 145 mEq/L   Potassium 4.1  3.5 - 5.1 mEq/L   Chloride 102  96 - 112 mEq/L   CO2 25  19 - 32 mEq/L   Glucose, Bld 266 (*) 70 - 99 mg/dL   BUN 21  6 - 23 mg/dL   Creatinine, Ser 3.08  0.50 - 1.10 mg/dL   Calcium 65.7  8.4 - 84.6 mg/dL   GFR calc non Af Amer 55 (*) >90 mL/min   GFR calc Af Amer 63 (*) >90 mL/min     Date: 08/02/2012  Rate: 83  Rhythm: normal sinus rhythm  QRS Axis: normal  Intervals: normal  ST/T Wave abnormalities: normal  Conduction Disutrbances:none  Narrative Interpretation: Normal EKG  Old EKG Reviewed: none available  6:35 PM Results for orders placed during the hospital encounter of 08/02/12  GLUCOSE, CAPILLARY      Result Value Range   Glucose-Capillary 284 (*) 70 - 99 mg/dL  CBC WITH DIFFERENTIAL      Result Value Range   WBC 13.4 (*) 4.0 - 10.5 K/uL   RBC 3.83 (*) 3.87 - 5.11 MIL/uL   Hemoglobin 10.5 (*) 12.0 - 15.0 g/dL   HCT 96.2 (*) 95.2 - 84.1 %   MCV 83.0  78.0 - 100.0 fL   MCH 27.4  26.0 -  34.0 pg   MCHC 33.0  30.0 - 36.0 g/dL   RDW 32.4 (*) 40.1 - 02.7 %   Platelets 313  150 - 400 K/uL   Neutrophils Relative % 55  43 - 77 %   Neutro Abs 7.4  1.7 - 7.7  K/uL   Lymphocytes Relative 34  12 - 46 %   Lymphs Abs 4.6 (*) 0.7 - 4.0 K/uL   Monocytes Relative 9  3 - 12 %   Monocytes Absolute 1.1 (*) 0.1 - 1.0 K/uL   Eosinophils Relative 2  0 - 5 %   Eosinophils Absolute 0.2  0.0 - 0.7 K/uL   Basophils Relative 0  0 - 1 %   Basophils Absolute 0.0  0.0 - 0.1 K/uL  BASIC METABOLIC PANEL      Result Value Range   Sodium 141  135 - 145 mEq/L   Potassium 4.1  3.5 - 5.1 mEq/L   Chloride 102  96 - 112 mEq/L   CO2 25  19 - 32 mEq/L   Glucose, Bld 266 (*) 70 - 99 mg/dL   BUN 21  6 - 23 mg/dL   Creatinine, Ser 1.61  0.50 - 1.10 mg/dL   Calcium 09.6  8.4 - 04.5 mg/dL   GFR calc non Af Amer 55 (*) >90 mL/min   GFR calc Af Amer 63 (*) >90 mL/min  URINALYSIS, ROUTINE W REFLEX MICROSCOPIC      Result Value Range   Color, Urine YELLOW  YELLOW   APPearance CLEAR  CLEAR   Specific Gravity, Urine 1.010  1.005 - 1.030   pH 6.0  5.0 - 8.0   Glucose, UA 100 (*) NEGATIVE mg/dL   Hgb urine dipstick NEGATIVE  NEGATIVE   Bilirubin Urine NEGATIVE  NEGATIVE   Ketones, ur NEGATIVE  NEGATIVE mg/dL   Protein, ur NEGATIVE  NEGATIVE mg/dL   Urobilinogen, UA 0.2  0.0 - 1.0 mg/dL   Nitrite NEGATIVE  NEGATIVE   Leukocytes, UA NEGATIVE  NEGATIVE  PRO B NATRIURETIC PEPTIDE      Result Value Range   Pro B Natriuretic peptide (BNP) 145.2 (*) 0 - 125 pg/mL  URIC ACID      Result Value Range   Uric Acid, Serum 8.7 (*) 2.4 - 7.0 mg/dL  TROPONIN I      Result Value Range   Troponin I <0.30  <0.30 ng/mL   Dg Chest 2 View  08/02/2012   *RADIOLOGY REPORT*  Clinical Data: Shortness of breath.  Suspect CHF.  CHEST - 2 VIEW  Comparison: 11/25/2011  Findings: Moderate thoracic spondylosis. Midline trachea.  Normal heart size and mediastinal contours.  Mild right hemidiaphragm elevation. No pleural effusion or  pneumothorax.  Clear lungs.  No congestive failure.  IMPRESSION: No congestive heart failure or acute process.   Original Report Authenticated By: Jeronimo Greaves, M.D.   Dg Ankle Complete Right  07/15/2012   *RADIOLOGY REPORT*  Clinical Data: Generalized right ankle pain.  Prior fracture and fusion.  RIGHT ANKLE - COMPLETE 3+ VIEW  Comparison: None.  Findings: Prior tibiotalar solid bony fusion observed.  The distal fibula tapers with minimal if any articulation with the tibia. A cylindrical track of small high density particles is noted projecting along the distal anterior tibiofibular margin.  Possible fusion of the anterior subtalar joint.  Prominent spurring along the posterior subtalar facet.  Small plantar calcaneal spur. No fracture observed.  Trace edema in Kager's fat pad.  IMPRESSION:  1.  Solid bony tibiotalar fusion.  Distal fibula resected or resorbed. 2.  Cannot exclude fusion of the anterior subtalar joint. Prominent degenerative spurring along the posterior subtalar facet. 3.  Small plantar calcaneal spur.   Original Report Authenticated By: Gaylyn Rong, M.D.    Lab tests show that she  has gout.  Her tests for MI and CHF were negative.  She should continue on indomethacin for her gout, and can take hydrocodone-acetaminophen if needed for pain.   1. Gout     I personally performed the services described in this documentation, which was scribed in my presence. The recorded information has been reviewed and is accurate.  Margaret Human, MD      Carleene Cooper III, MD 08/02/12 [phone]

## 2014-08-19 ENCOUNTER — Telehealth: Payer: Self-pay | Admitting: Nutrition

## 2014-08-19 NOTE — Telephone Encounter (Signed)
Left message with her son to call. PC

## 2015-07-21 IMAGING — US ULTRASOUND LEFT BREAST LIMITED
1 series · 13 of 16 positions shown · non-contrast
Comparison: Previous exam(s).

CLINICAL DATA: Palpable lump in the left breast.

EXAM:
2D DIGITAL DIAGNOSTIC BILATERAL MAMMOGRAM WITH CAD AND ADJUNCT TOMO
ULTRASOUND LEFT BREAST

[Series 1: ultrasound left breast limited · 0.06mm/px · 13 of 16 slices shown]
[im 1/16]
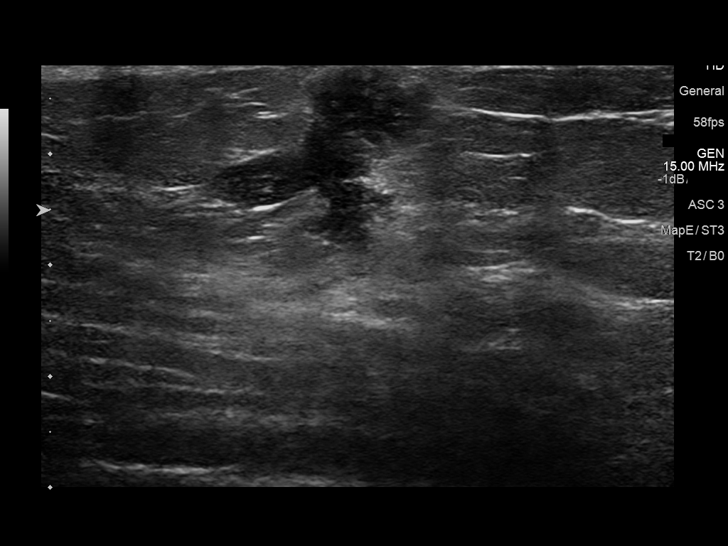
[im 2/16]
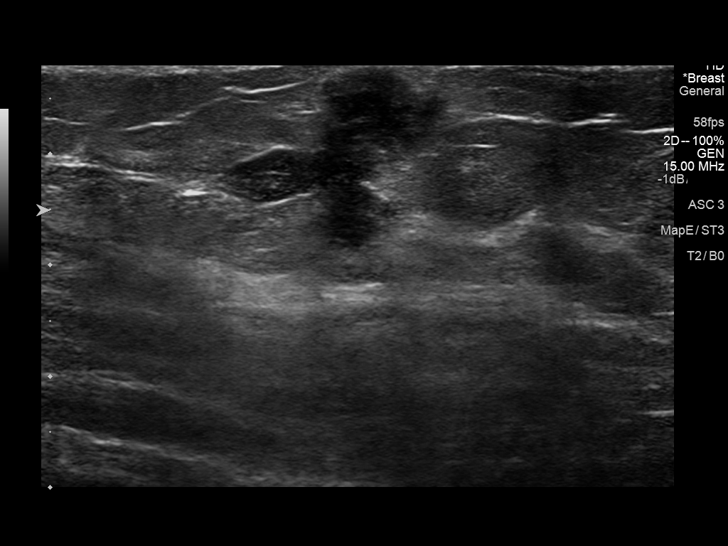
[im 4/16]
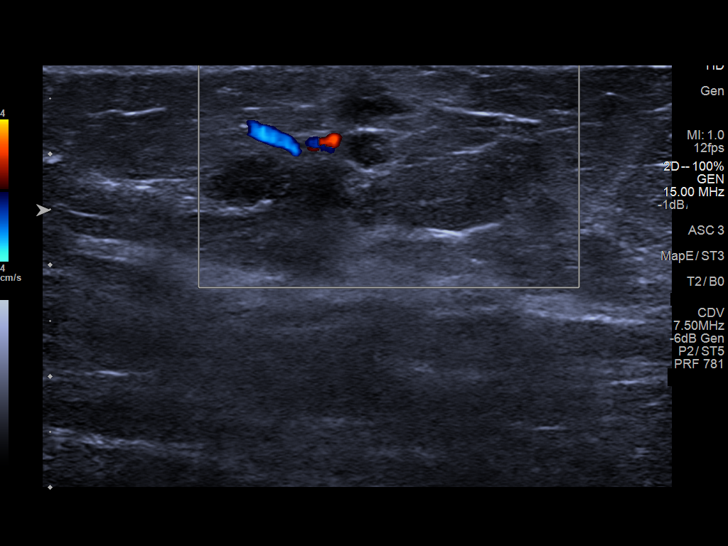
[im 5/16]
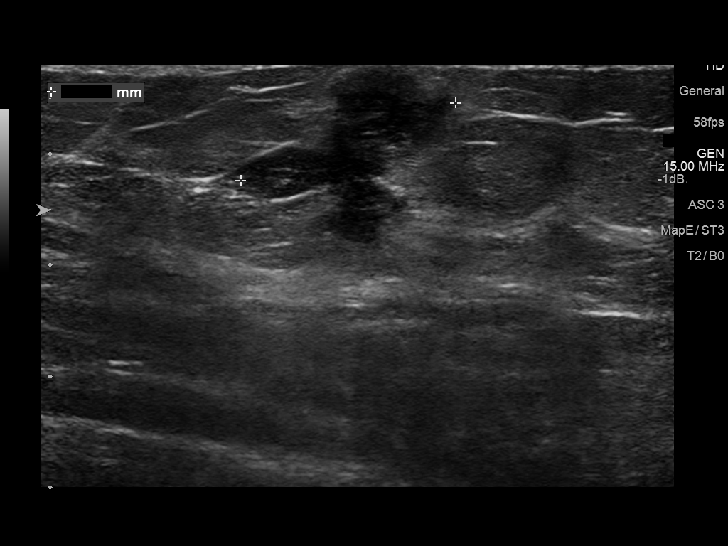
[im 6/16]
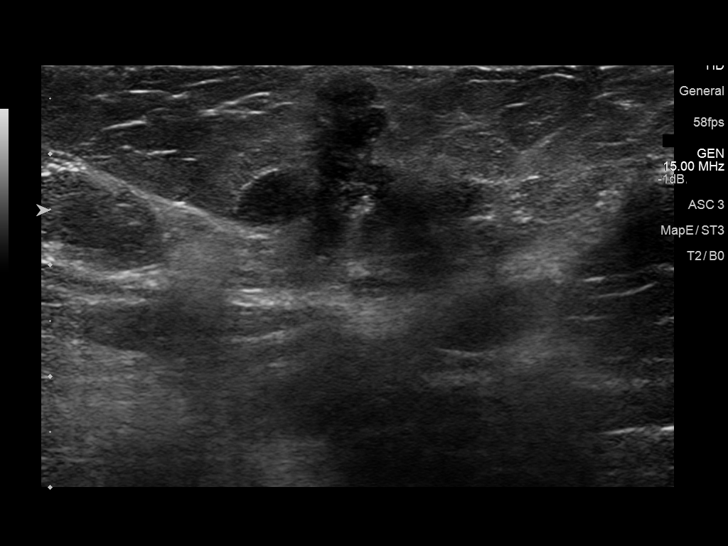
[im 7/16]
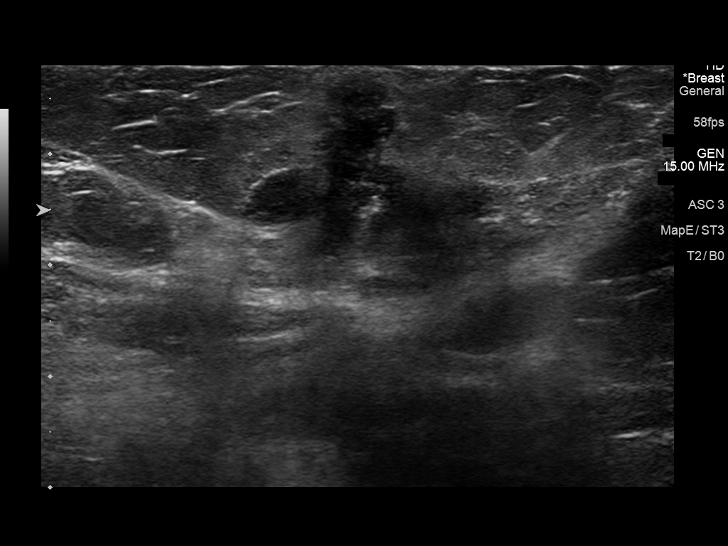
[im 9/16]
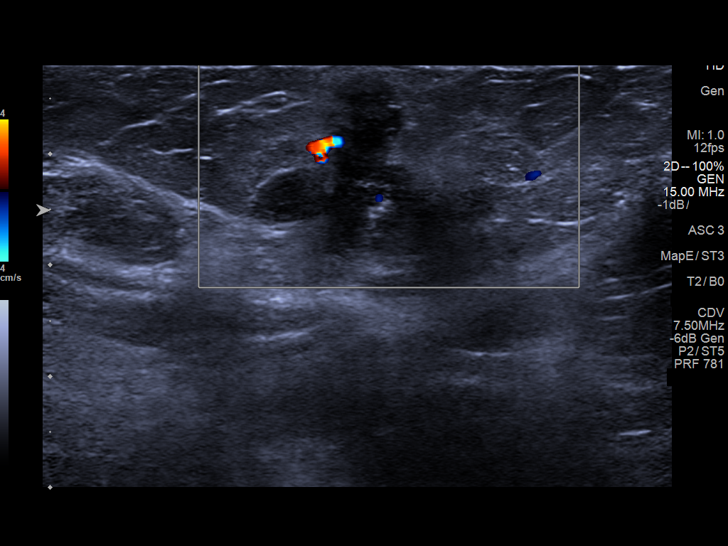
[im 10/16]
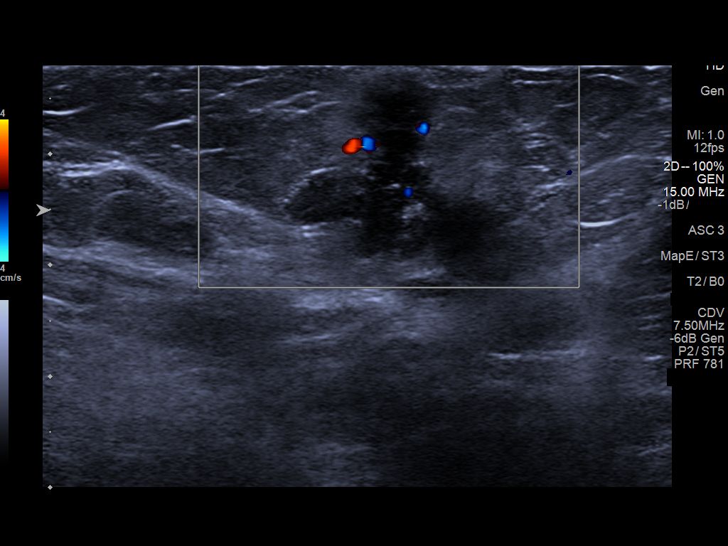
[im 11/16]
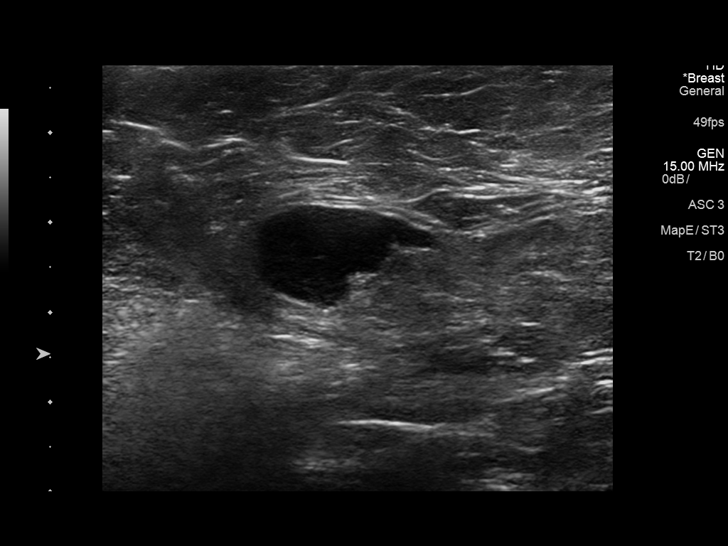
[im 12/16]
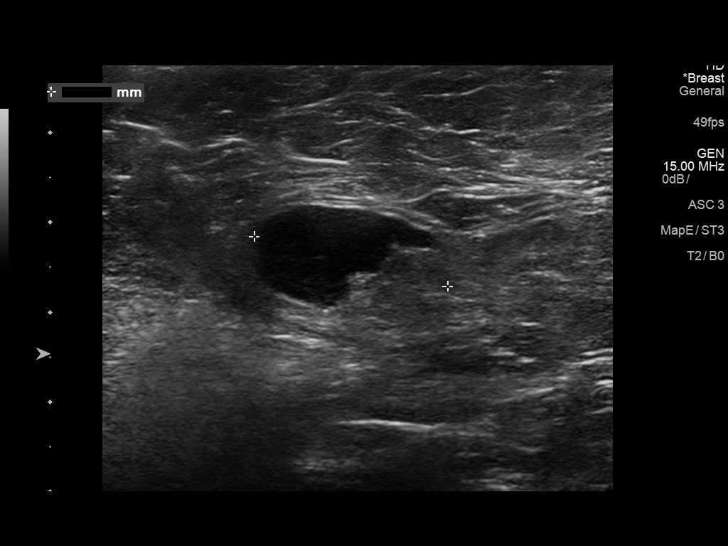
[im 13/16]
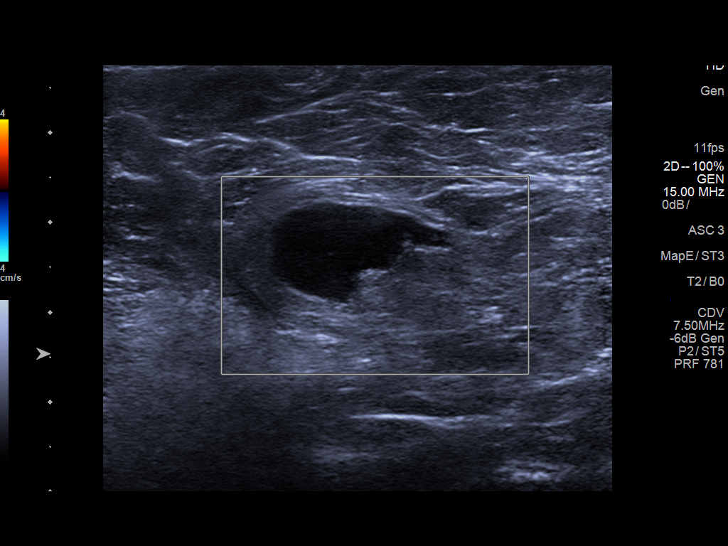
[im 15/16]
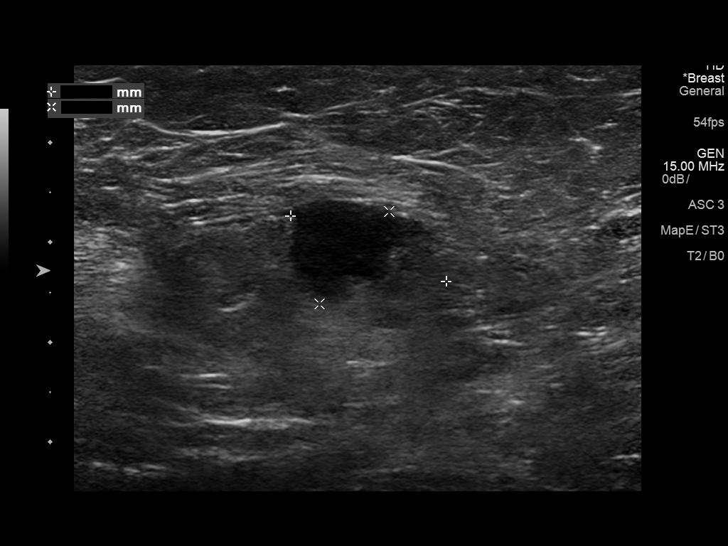
[im 16/16]
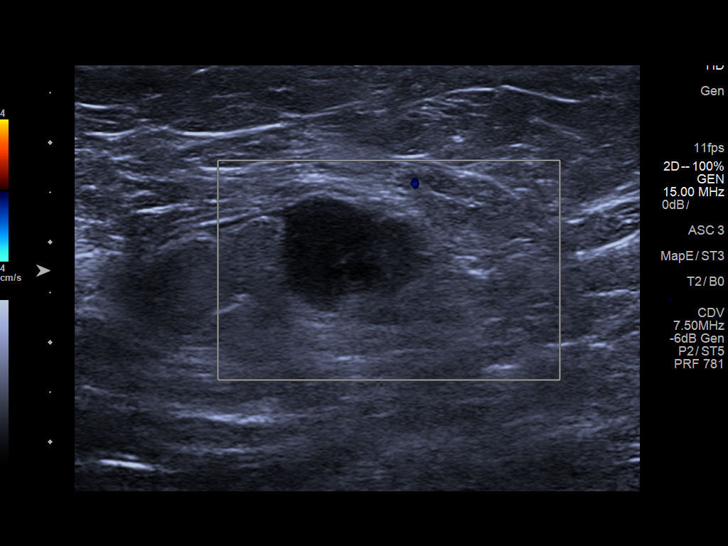

[13 of 16 positions shown; findings below may reference images not displayed]

ACR Breast Density Category c: The breast tissue is heterogeneously
dense, which may obscure small masses.
FINDINGS: An asymmetry in the lateral right breast on the CC view only
resolves on the laterally exaggerated CC view. A similar but smaller
asymmetry in the lateral left breast on the CC view only also
resolves on the laterally exaggerated view. A prominent lymph node
is seen laterally on the left on the laterally exaggerated view. The
palpable lump correlates with an irregular mass measuring up to 2
cm.

Mammographic images were processed with CAD.

On physical exam, a firm palpable lump is identified in the region
of the patient's symptoms.

Targeted ultrasound is performed, showing an irregular suspicious
mass in the left breast at 10 o'clock, 10 cm from the nipple
correlating with the patient's symptoms. The mass is irregular with
anti parallel orientation measuring 2.3 x 1.7 x 2.1 cm. An abnormal
left axillary lymph node is identified with a thickened cortex.
Ultrasound of the lateral aspects of both breasts demonstrate no
additional abnormalities. A cluster of cysts is seen on the right.
IMPRESSION: Highly suspicious mass in the left breast at 10 o'clock, 10 cm from
the nipple. Abnormal left axillary lymph node.

RECOMMENDATION:
Recommend ultrasound-guided biopsy of the left breast mass and the
abnormal left axillary lymph node.

I have discussed the findings and recommendations with the patient.
Results were also provided in writing at the conclusion of the
visit. If applicable, a reminder letter will be sent to the patient
regarding the next appointment.

BI-RADS CATEGORY  5: Highly suggestive of malignancy.

## 2015-07-24 DIAGNOSIS — E669 Obesity, unspecified: Secondary | ICD-10-CM | POA: Diagnosis not present

## 2015-07-24 DIAGNOSIS — E039 Hypothyroidism, unspecified: Secondary | ICD-10-CM | POA: Diagnosis not present

## 2015-07-24 DIAGNOSIS — E109 Type 1 diabetes mellitus without complications: Secondary | ICD-10-CM | POA: Diagnosis not present

## 2015-07-24 DIAGNOSIS — J449 Chronic obstructive pulmonary disease, unspecified: Secondary | ICD-10-CM | POA: Diagnosis not present

## 2015-07-24 DIAGNOSIS — G4730 Sleep apnea, unspecified: Secondary | ICD-10-CM | POA: Diagnosis not present

## 2015-07-24 DIAGNOSIS — K219 Gastro-esophageal reflux disease without esophagitis: Secondary | ICD-10-CM | POA: Diagnosis not present

## 2015-07-24 DIAGNOSIS — E785 Hyperlipidemia, unspecified: Secondary | ICD-10-CM | POA: Diagnosis not present

## 2015-07-24 DIAGNOSIS — E118 Type 2 diabetes mellitus with unspecified complications: Secondary | ICD-10-CM | POA: Diagnosis not present

## 2015-07-24 DIAGNOSIS — F3132 Bipolar disorder, current episode depressed, moderate: Secondary | ICD-10-CM | POA: Diagnosis not present

## 2015-07-29 DIAGNOSIS — E109 Type 1 diabetes mellitus without complications: Secondary | ICD-10-CM | POA: Diagnosis not present

## 2015-11-04 DIAGNOSIS — E118 Type 2 diabetes mellitus with unspecified complications: Secondary | ICD-10-CM | POA: Diagnosis not present

## 2015-11-04 DIAGNOSIS — Z79899 Other long term (current) drug therapy: Secondary | ICD-10-CM | POA: Diagnosis not present

## 2015-11-04 DIAGNOSIS — E039 Hypothyroidism, unspecified: Secondary | ICD-10-CM | POA: Diagnosis not present

## 2015-11-04 DIAGNOSIS — E782 Mixed hyperlipidemia: Secondary | ICD-10-CM | POA: Diagnosis not present

## 2015-11-20 DIAGNOSIS — E109 Type 1 diabetes mellitus without complications: Secondary | ICD-10-CM | POA: Diagnosis not present

## 2015-11-20 DIAGNOSIS — J449 Chronic obstructive pulmonary disease, unspecified: Secondary | ICD-10-CM | POA: Diagnosis not present

## 2016-02-12 ENCOUNTER — Other Ambulatory Visit (HOSPITAL_COMMUNITY): Payer: Self-pay | Admitting: *Deleted

## 2016-02-12 DIAGNOSIS — IMO0002 Reserved for concepts with insufficient information to code with codable children: Secondary | ICD-10-CM

## 2016-02-12 DIAGNOSIS — R229 Localized swelling, mass and lump, unspecified: Principal | ICD-10-CM

## 2016-02-19 DIAGNOSIS — E782 Mixed hyperlipidemia: Secondary | ICD-10-CM | POA: Diagnosis not present

## 2016-02-19 DIAGNOSIS — Z79899 Other long term (current) drug therapy: Secondary | ICD-10-CM | POA: Diagnosis not present

## 2016-02-19 DIAGNOSIS — E118 Type 2 diabetes mellitus with unspecified complications: Secondary | ICD-10-CM | POA: Diagnosis not present

## 2016-03-09 ENCOUNTER — Other Ambulatory Visit (HOSPITAL_COMMUNITY): Payer: Self-pay | Admitting: *Deleted

## 2016-03-09 ENCOUNTER — Ambulatory Visit (HOSPITAL_COMMUNITY)
Admission: RE | Admit: 2016-03-09 | Discharge: 2016-03-09 | Disposition: A | Discharge status: Home / Self Care | Payer: PPO | Source: Ambulatory Visit | Attending: *Deleted | Admitting: *Deleted

## 2016-03-09 DIAGNOSIS — R928 Other abnormal and inconclusive findings on diagnostic imaging of breast: Secondary | ICD-10-CM | POA: Diagnosis not present

## 2016-03-09 DIAGNOSIS — IMO0002 Reserved for concepts with insufficient information to code with codable children: Secondary | ICD-10-CM

## 2016-03-09 DIAGNOSIS — R229 Localized swelling, mass and lump, unspecified: Secondary | ICD-10-CM | POA: Diagnosis not present

## 2016-03-09 DIAGNOSIS — N6320 Unspecified lump in the left breast, unspecified quadrant: Secondary | ICD-10-CM | POA: Insufficient documentation

## 2016-03-09 DIAGNOSIS — N6001 Solitary cyst of right breast: Secondary | ICD-10-CM | POA: Diagnosis not present

## 2016-03-09 IMAGING — MG 2D DIGITAL DIAGNOSTIC BILATERAL MAMMOGRAM WITH CAD AND ADJUNCT T
8 of 16 series · 8 of 40 positions shown · non-contrast
Comparison: Previous exam(s).

CLINICAL DATA: Palpable lump in the left breast.

EXAM:
2D DIGITAL DIAGNOSTIC BILATERAL MAMMOGRAM WITH CAD AND ADJUNCT TOMO
ULTRASOUND LEFT BREAST

[R MLO (1 of 2)]
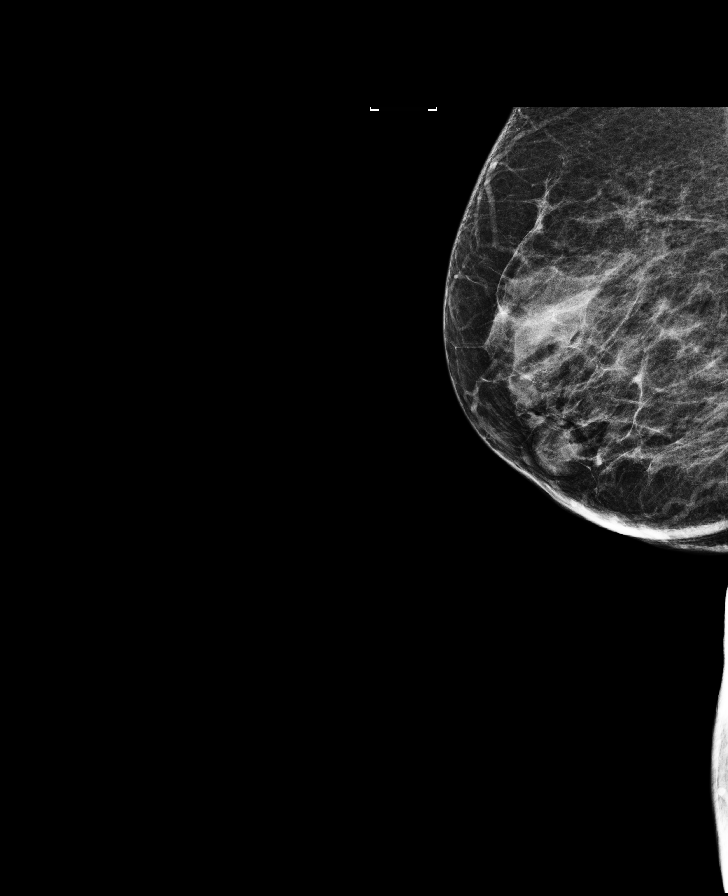

[L TAN]
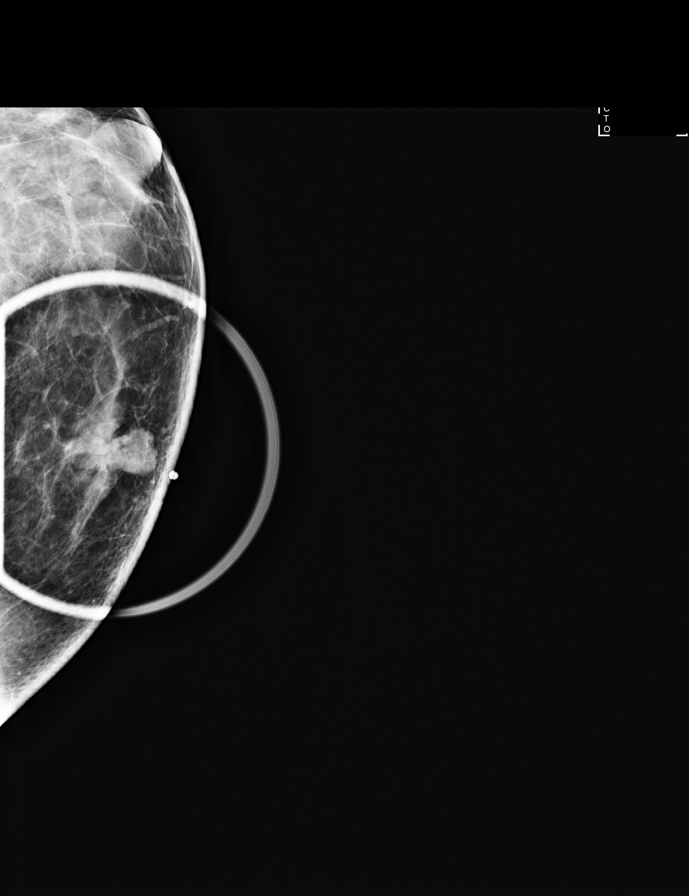

[R CC (1 of 2)]
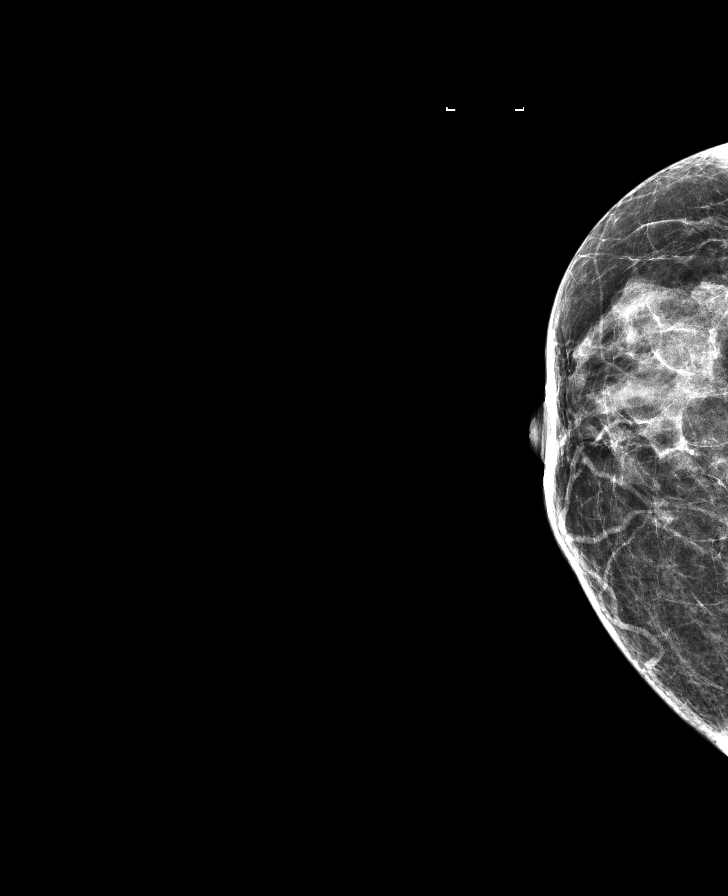

[R XCCL]
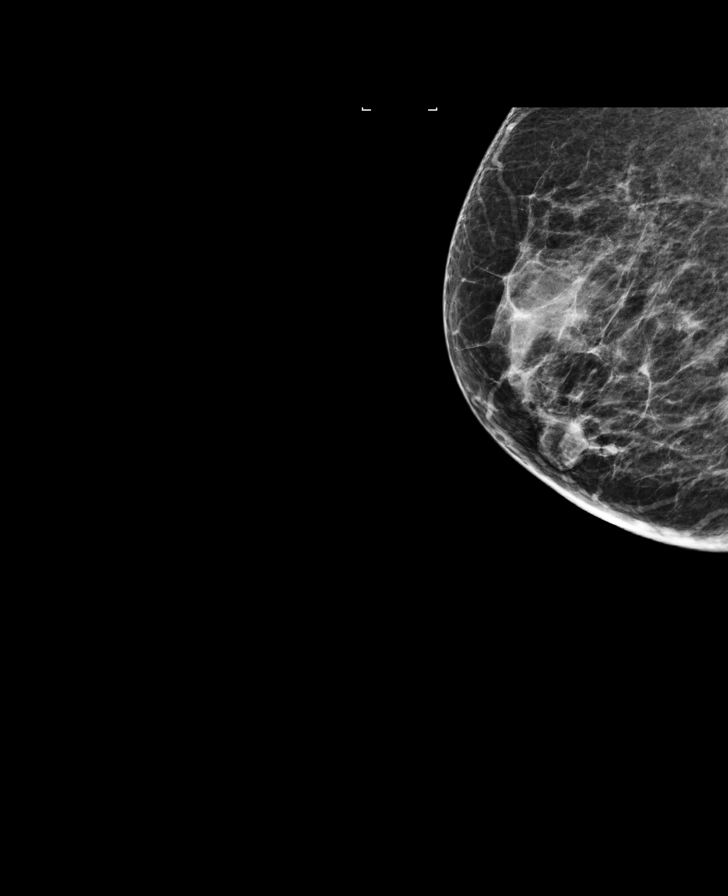

[R MLO (2 of 2)]
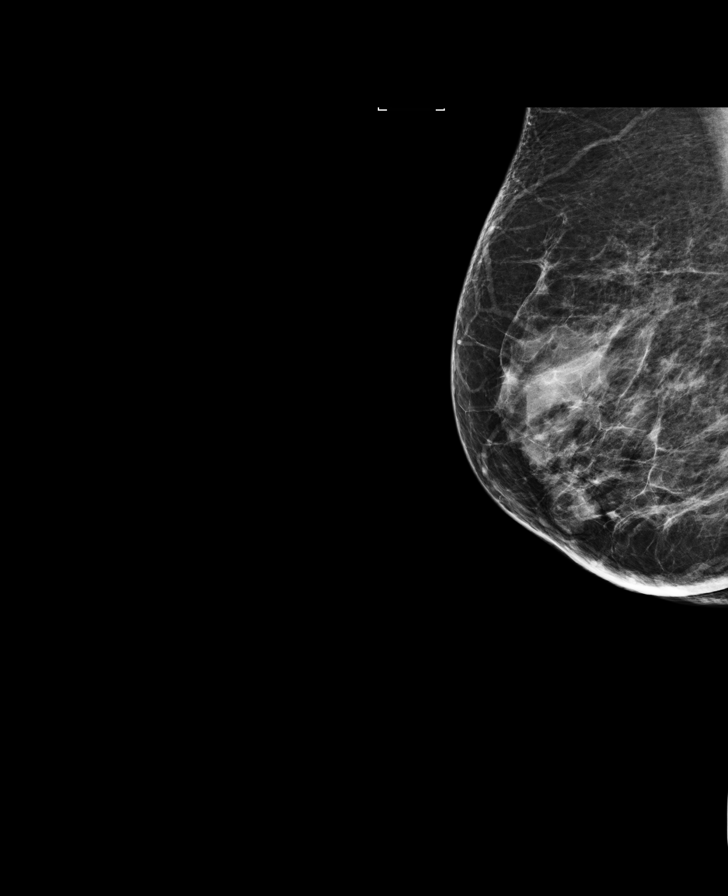

[R CC (2 of 2)]
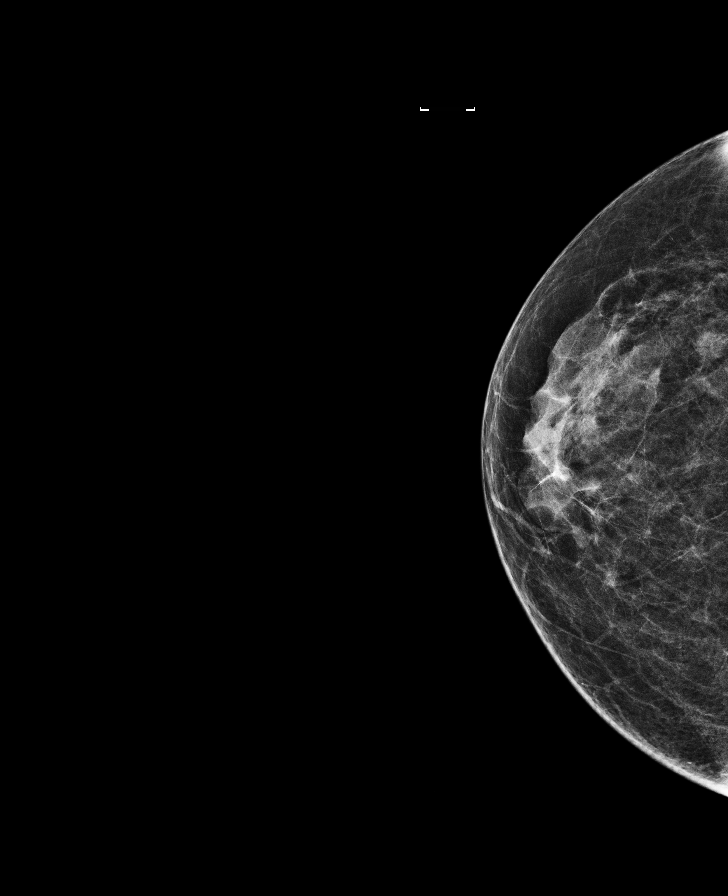

[L XCCL]
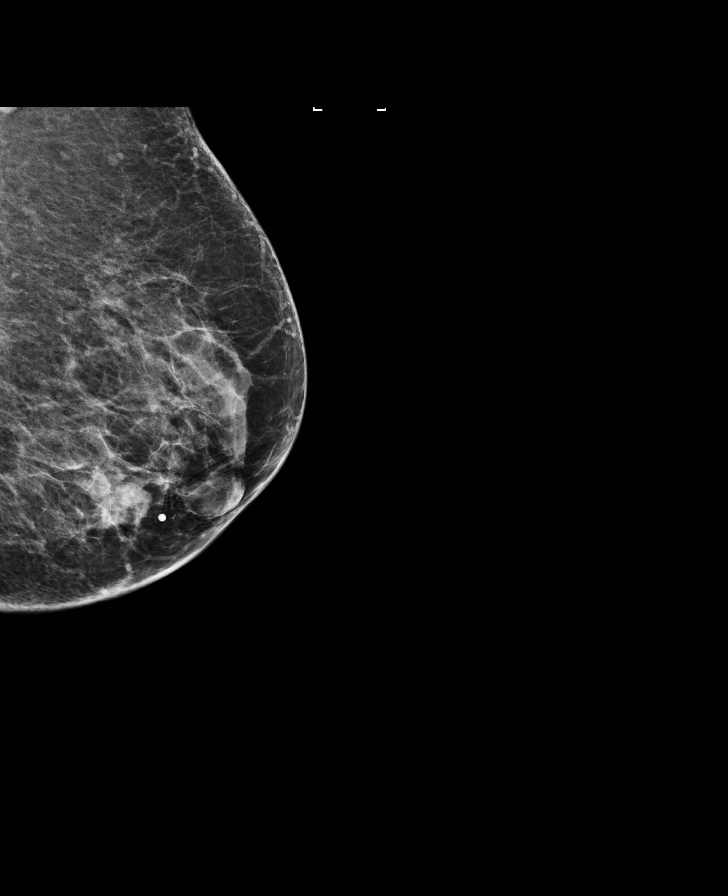

[L CC]
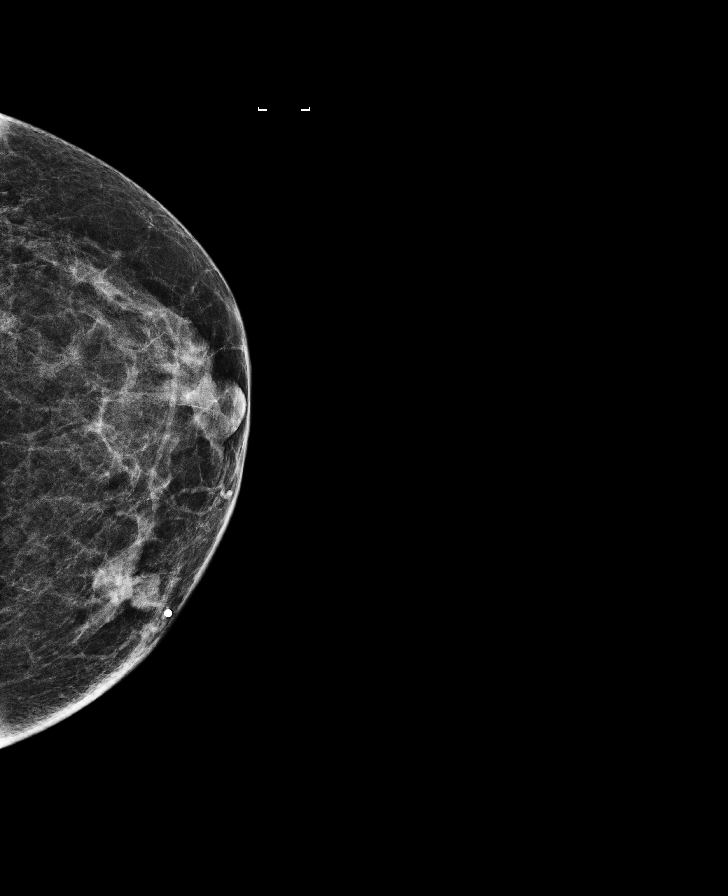

[8 of 40 positions shown; findings below may reference images not displayed]

ACR Breast Density Category c: The breast tissue is heterogeneously
dense, which may obscure small masses.
FINDINGS: An asymmetry in the lateral right breast on the CC view only
resolves on the laterally exaggerated CC view. A similar but smaller
asymmetry in the lateral left breast on the CC view only also
resolves on the laterally exaggerated view. A prominent lymph node
is seen laterally on the left on the laterally exaggerated view. The
palpable lump correlates with an irregular mass measuring up to 2
cm.

Mammographic images were processed with CAD.

On physical exam, a firm palpable lump is identified in the region
of the patient's symptoms.

Targeted ultrasound is performed, showing an irregular suspicious
mass in the left breast at 10 o'clock, 10 cm from the nipple
correlating with the patient's symptoms. The mass is irregular with
anti parallel orientation measuring 2.3 x 1.7 x 2.1 cm. An abnormal
left axillary lymph node is identified with a thickened cortex.
Ultrasound of the lateral aspects of both breasts demonstrate no
additional abnormalities. A cluster of cysts is seen on the right.
IMPRESSION: Highly suspicious mass in the left breast at 10 o'clock, 10 cm from
the nipple. Abnormal left axillary lymph node.

RECOMMENDATION:
Recommend ultrasound-guided biopsy of the left breast mass and the
abnormal left axillary lymph node.

I have discussed the findings and recommendations with the patient.
Results were also provided in writing at the conclusion of the
visit. If applicable, a reminder letter will be sent to the patient
regarding the next appointment.

BI-RADS CATEGORY  5: Highly suggestive of malignancy.

## 2016-03-11 ENCOUNTER — Other Ambulatory Visit (HOSPITAL_COMMUNITY): Payer: Self-pay | Admitting: *Deleted

## 2016-03-11 DIAGNOSIS — N6320 Unspecified lump in the left breast, unspecified quadrant: Secondary | ICD-10-CM

## 2016-03-30 ENCOUNTER — Other Ambulatory Visit (HOSPITAL_COMMUNITY): Payer: Self-pay | Admitting: *Deleted

## 2016-03-30 ENCOUNTER — Ambulatory Visit (HOSPITAL_COMMUNITY)
Admission: RE | Admit: 2016-03-30 | Discharge: 2016-03-30 | Disposition: A | Discharge status: Home / Self Care | Payer: PPO | Source: Ambulatory Visit | Attending: *Deleted | Admitting: *Deleted

## 2016-03-30 DIAGNOSIS — N6320 Unspecified lump in the left breast, unspecified quadrant: Secondary | ICD-10-CM | POA: Diagnosis not present

## 2016-03-30 DIAGNOSIS — N6322 Unspecified lump in the left breast, upper inner quadrant: Secondary | ICD-10-CM | POA: Diagnosis not present

## 2016-03-30 DIAGNOSIS — C773 Secondary and unspecified malignant neoplasm of axilla and upper limb lymph nodes: Secondary | ICD-10-CM | POA: Diagnosis not present

## 2016-03-30 DIAGNOSIS — Z170 Estrogen receptor positive status [ER+]: Secondary | ICD-10-CM | POA: Diagnosis not present

## 2016-03-30 DIAGNOSIS — C50212 Malignant neoplasm of upper-inner quadrant of left female breast: Secondary | ICD-10-CM | POA: Insufficient documentation

## 2016-03-30 DIAGNOSIS — R590 Localized enlarged lymph nodes: Secondary | ICD-10-CM | POA: Diagnosis not present

## 2016-03-30 IMAGING — US US BREAST BX W LOC DEV EA ADD LESION IMG BX SPEC US GUIDE
1 series · 13 of 20 positions shown · non-contrast
Comparison: Previous exam(s).

ADDENDUM:
Pathology of the left axillary lymph node revealed METASTATIC DUCTAL
CARCINOMA. This was found to be concordant by Dr. HSV.

Recommendations:  Surgical referral.
The patient was contacted by [facility] for a post biopsy
check on [DATE]. She stated this site has done well following the
biopsy. Post biopsy instructions were reviewed with the patient. All
of her questions were answered. She was encouraged to [facility] ([REDACTED]) with any further questions or
concerns.
At the patient's request, pathology and recommendations were relayed
to the patient by Dr. HSV on [DATE] by phone. All of her
questions were answered. She requested a referral to Dr. HSV.
An appointment was made with Dr. HSV for [DATE] at [DATE]. The
patient's daughter in [facility] has been notified of the
appointment information. Please see addendum of breast biopsy for
further detail.
Addendum by [facility] on [DATE].
CLINICAL DATA: Biopsy was recommended of a suspicious left axillary
lymph node and a palpable suspicious left breast mass at 10 o'clock
position. This report describes the left axillary lymph node biopsy.
EXAM:
ULTRASOUND GUIDED CORE NEEDLE BIOPSY OF A LEFT AXILLARY NODE

[Series 1: us breast bx w loc dev ea add lesion img bx spec u · 0.06mm/px · 13 of 20 slices shown]
[im 1/20]
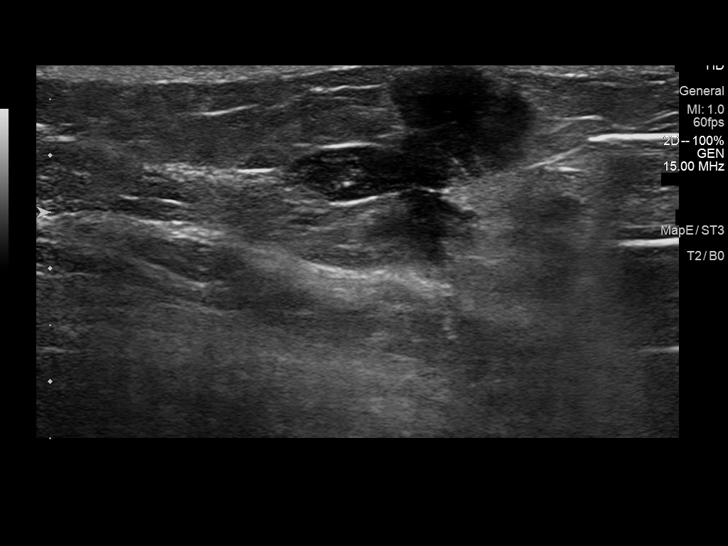
[im 3/20]
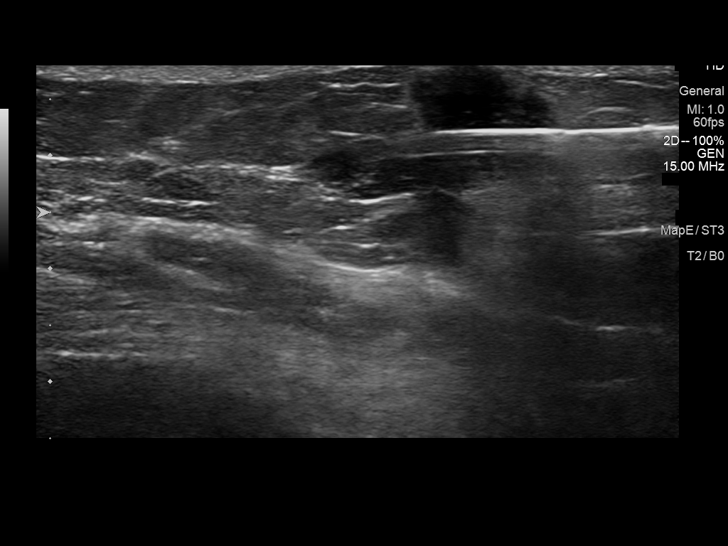
[im 4/20]
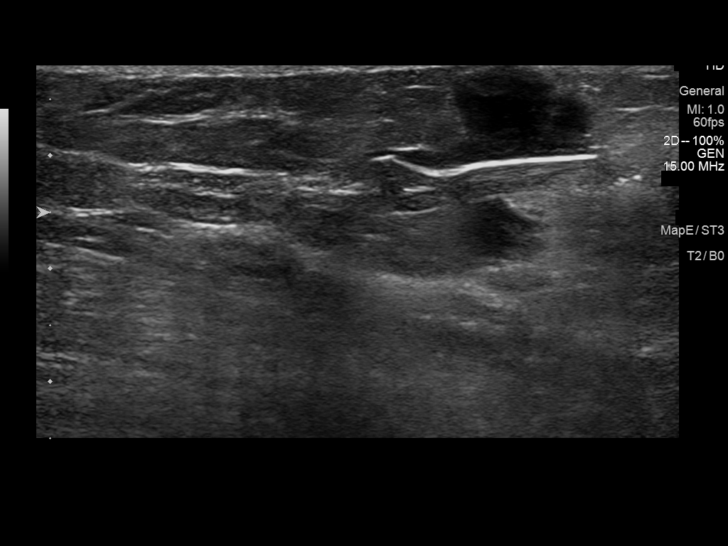
[im 6/20]
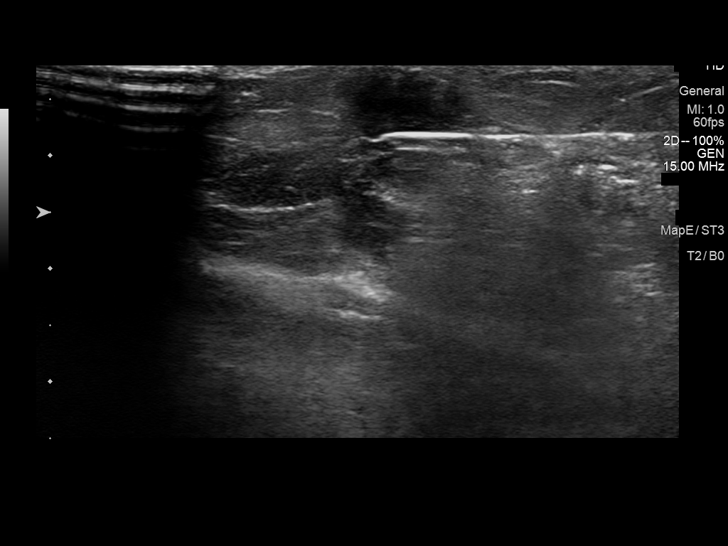
[im 7/20]
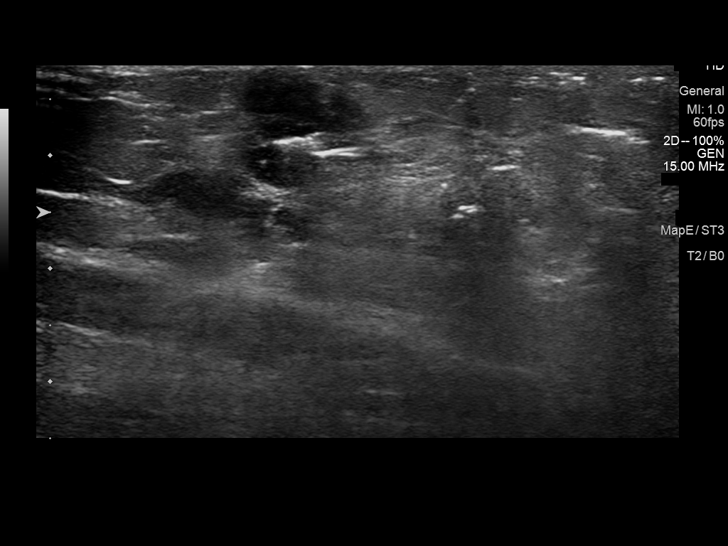
[im 9/20]
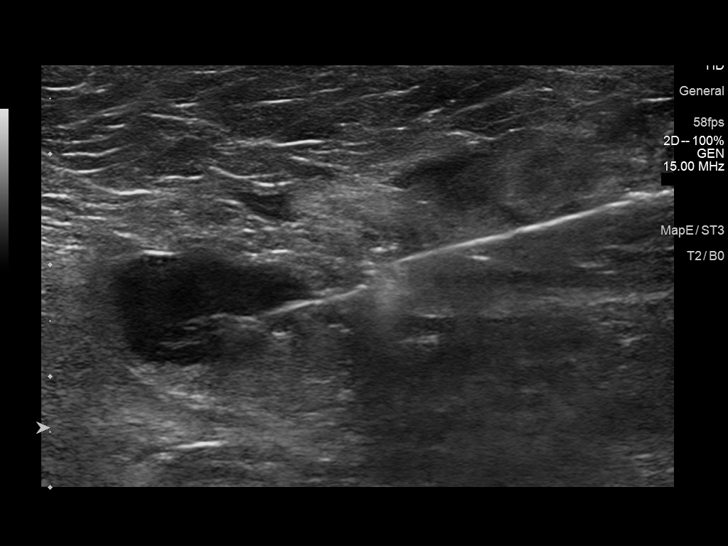
[im 11/20]
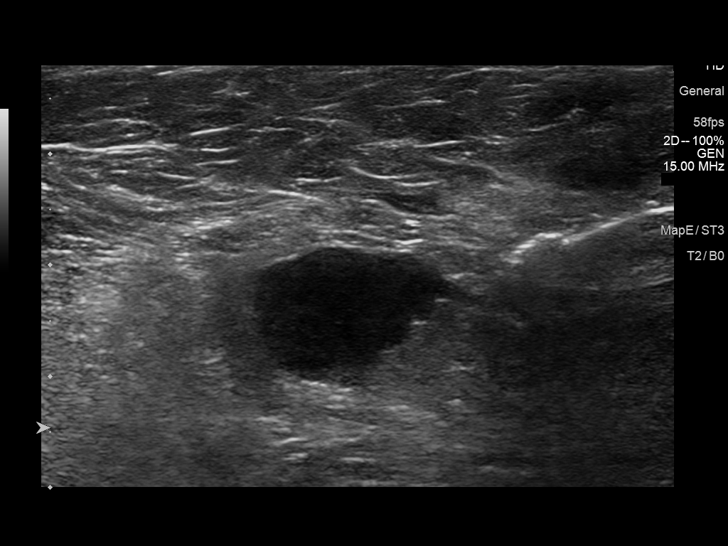
[im 12/20]
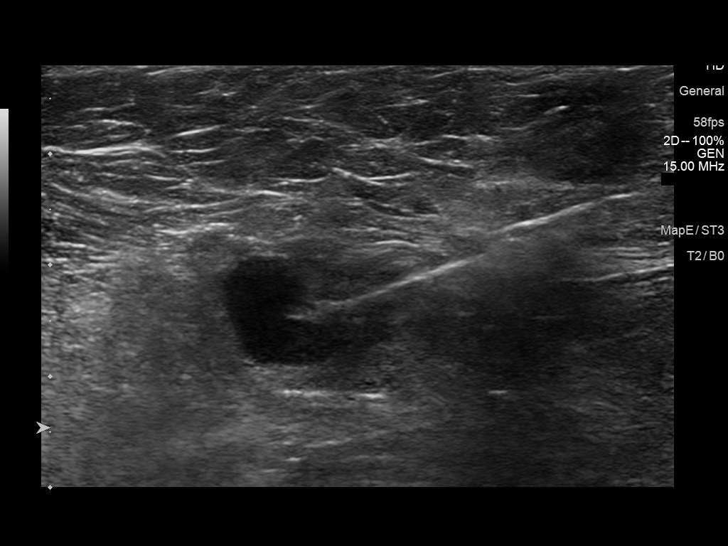
[im 14/20]
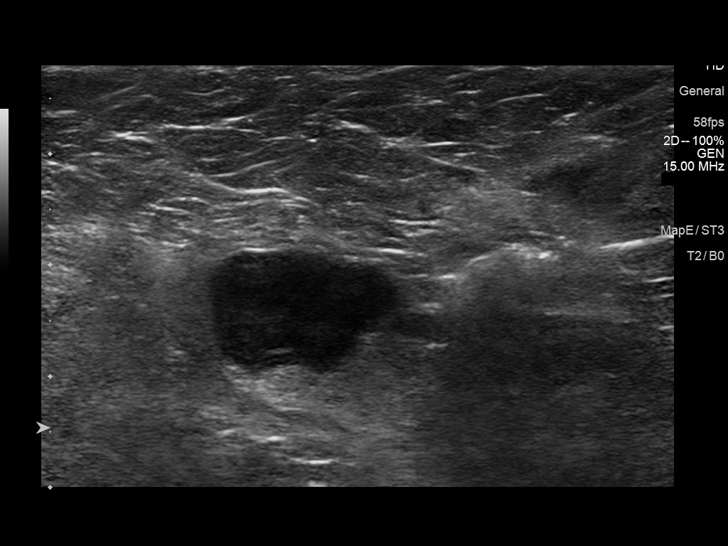
[im 15/20]
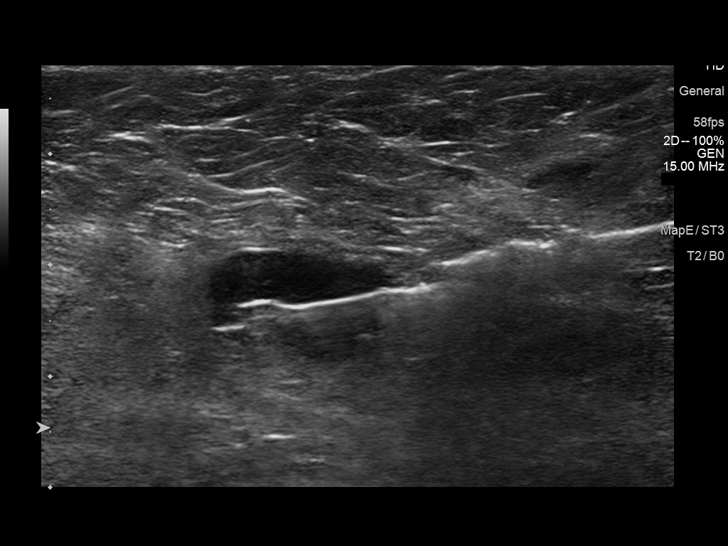
[im 17/20]
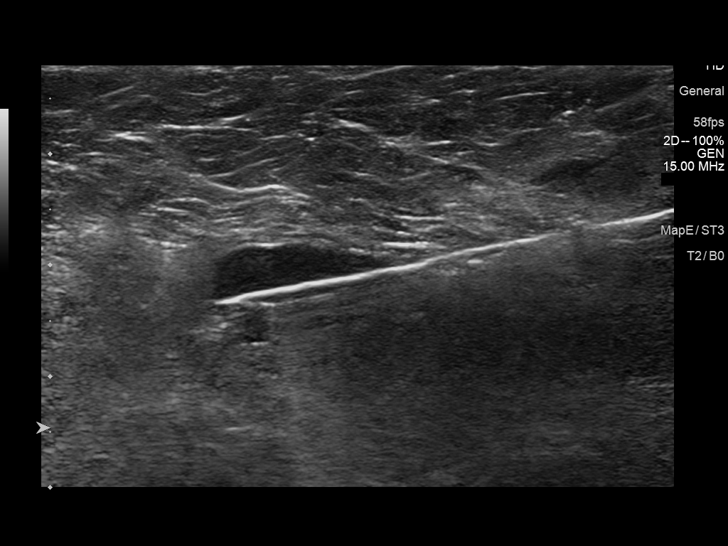
[im 18/20]
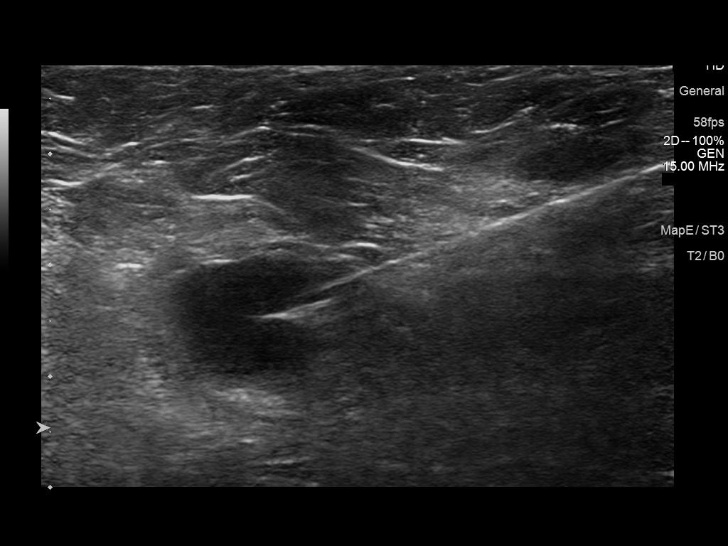
[im 20/20]
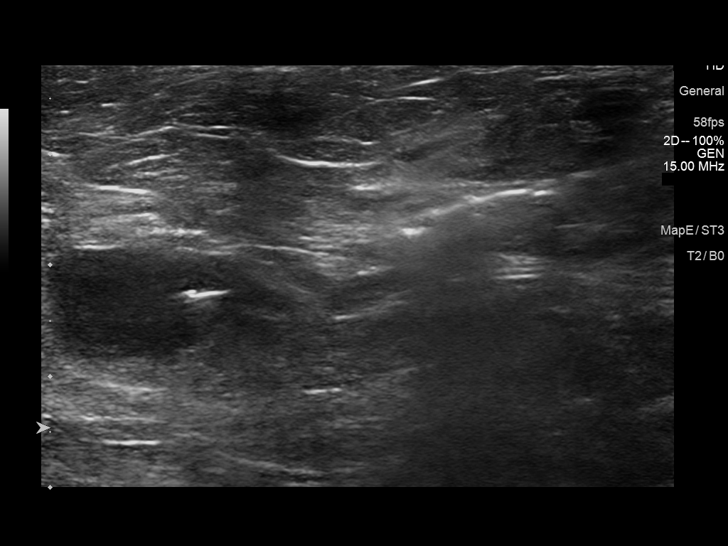

[13 of 20 positions shown; findings below may reference images not displayed]



Using sterile technique and 1% Lidocaine as local anesthetic, under
direct ultrasound visualization, a 14 gauge HSV device was
used to perform biopsy of a suspicious left axillary lymph node with
diffuse cortical thickening using a lateral to medial approach. At
the conclusion of the procedure a wing shaped tissue marker clip was
deployed into the biopsy cavity. Follow up 2 view mammogram was
performed and dictated separately.
IMPRESSION: Ultrasound guided biopsy of left axillary lymph node. No apparent
complications.

## 2016-03-30 MED ORDER — LIDOCAINE-EPINEPHRINE (PF) 1 %-1:200000 IJ SOLN
INTRAMUSCULAR | Status: AC
  Filled 2016-03-30: qty 30

## 2016-03-30 MED ORDER — LIDOCAINE HCL (PF) 1 % IJ SOLN
INTRAMUSCULAR | Status: AC
  Filled 2016-03-30: qty 15

## 2016-04-05 DIAGNOSIS — C50912 Malignant neoplasm of unspecified site of left female breast: Secondary | ICD-10-CM | POA: Diagnosis not present

## 2016-04-12 ENCOUNTER — Encounter (HOSPITAL_COMMUNITY)
Admission: RE | Admit: 2016-04-12 | Discharge: 2016-04-12 | Disposition: A | Discharge status: Home / Self Care | Payer: PPO | Source: Ambulatory Visit | Attending: General Surgery | Admitting: General Surgery

## 2016-04-12 ENCOUNTER — Encounter (HOSPITAL_COMMUNITY): Payer: Self-pay

## 2016-04-12 ENCOUNTER — Ambulatory Visit (HOSPITAL_COMMUNITY)
Admission: RE | Admit: 2016-04-12 | Discharge: 2016-04-12 | Disposition: A | Discharge status: Home / Self Care | Payer: PPO | Source: Ambulatory Visit | Attending: General Surgery | Admitting: General Surgery

## 2016-04-12 DIAGNOSIS — Z01812 Encounter for preprocedural laboratory examination: Secondary | ICD-10-CM

## 2016-04-12 DIAGNOSIS — E7800 Pure hypercholesterolemia, unspecified: Secondary | ICD-10-CM | POA: Diagnosis not present

## 2016-04-12 DIAGNOSIS — Z794 Long term (current) use of insulin: Secondary | ICD-10-CM | POA: Diagnosis not present

## 2016-04-12 DIAGNOSIS — I10 Essential (primary) hypertension: Secondary | ICD-10-CM | POA: Diagnosis not present

## 2016-04-12 DIAGNOSIS — F209 Schizophrenia, unspecified: Secondary | ICD-10-CM | POA: Diagnosis not present

## 2016-04-12 DIAGNOSIS — Z808 Family history of malignant neoplasm of other organs or systems: Secondary | ICD-10-CM | POA: Diagnosis not present

## 2016-04-12 DIAGNOSIS — C50212 Malignant neoplasm of upper-inner quadrant of left female breast: Secondary | ICD-10-CM | POA: Diagnosis not present

## 2016-04-12 DIAGNOSIS — Z91040 Latex allergy status: Secondary | ICD-10-CM | POA: Diagnosis not present

## 2016-04-12 DIAGNOSIS — C50912 Malignant neoplasm of unspecified site of left female breast: Secondary | ICD-10-CM

## 2016-04-12 DIAGNOSIS — Z9981 Dependence on supplemental oxygen: Secondary | ICD-10-CM | POA: Diagnosis not present

## 2016-04-12 DIAGNOSIS — Z01810 Encounter for preprocedural cardiovascular examination: Secondary | ICD-10-CM | POA: Insufficient documentation

## 2016-04-12 DIAGNOSIS — Z87891 Personal history of nicotine dependence: Secondary | ICD-10-CM | POA: Diagnosis not present

## 2016-04-12 DIAGNOSIS — J449 Chronic obstructive pulmonary disease, unspecified: Secondary | ICD-10-CM | POA: Diagnosis not present

## 2016-04-12 DIAGNOSIS — C773 Secondary and unspecified malignant neoplasm of axilla and upper limb lymph nodes: Secondary | ICD-10-CM | POA: Diagnosis not present

## 2016-04-12 DIAGNOSIS — F79 Unspecified intellectual disabilities: Secondary | ICD-10-CM | POA: Diagnosis not present

## 2016-04-12 DIAGNOSIS — Z7982 Long term (current) use of aspirin: Secondary | ICD-10-CM | POA: Diagnosis not present

## 2016-04-12 DIAGNOSIS — Z882 Allergy status to sulfonamides status: Secondary | ICD-10-CM | POA: Diagnosis not present

## 2016-04-12 DIAGNOSIS — Z881 Allergy status to other antibiotic agents status: Secondary | ICD-10-CM | POA: Diagnosis not present

## 2016-04-12 DIAGNOSIS — Z79899 Other long term (current) drug therapy: Secondary | ICD-10-CM | POA: Diagnosis not present

## 2016-04-12 DIAGNOSIS — E039 Hypothyroidism, unspecified: Secondary | ICD-10-CM | POA: Diagnosis not present

## 2016-04-12 DIAGNOSIS — Z6833 Body mass index (BMI) 33.0-33.9, adult: Secondary | ICD-10-CM | POA: Diagnosis not present

## 2016-04-12 DIAGNOSIS — G4730 Sleep apnea, unspecified: Secondary | ICD-10-CM | POA: Diagnosis not present

## 2016-04-12 DIAGNOSIS — E119 Type 2 diabetes mellitus without complications: Secondary | ICD-10-CM | POA: Diagnosis not present

## 2016-04-12 HISTORY — DX: Hypothyroidism, unspecified: E03.9

## 2016-04-12 HISTORY — DX: Anxiety disorder, unspecified: F41.9

## 2016-04-12 HISTORY — DX: Unspecified osteoarthritis, unspecified site: M19.90

## 2016-04-12 HISTORY — DX: Malignant (primary) neoplasm, unspecified: C80.1

## 2016-04-12 HISTORY — DX: Myoneural disorder, unspecified: G70.9

## 2016-04-12 LAB — CBC WITH DIFFERENTIAL/PLATELET
Basophils Absolute: 0 10*3/uL (ref 0.0–0.1)
Basophils Relative: 0 %
Eosinophils Absolute: 0.1 10*3/uL (ref 0.0–0.7)
Eosinophils Relative: 1 %
HCT: 43.3 % (ref 36.0–46.0)
HEMOGLOBIN: 14.4 g/dL (ref 12.0–15.0)
LYMPHS ABS: 3.7 10*3/uL (ref 0.7–4.0)
LYMPHS PCT: 23 %
MCH: 29 pg (ref 26.0–34.0)
MCHC: 33.3 g/dL (ref 30.0–36.0)
MCV: 87.1 fL (ref 78.0–100.0)
MONOS PCT: 9 %
Monocytes Absolute: 1.5 10*3/uL — ABNORMAL HIGH (ref 0.1–1.0)
NEUTROS PCT: 67 %
Neutro Abs: 10.9 10*3/uL — ABNORMAL HIGH (ref 1.7–7.7)
Platelets: 243 10*3/uL (ref 150–400)
RBC: 4.97 MIL/uL (ref 3.87–5.11)
RDW: 15.3 % (ref 11.5–15.5)
WBC: 16.1 10*3/uL — ABNORMAL HIGH (ref 4.0–10.5)

## 2016-04-12 LAB — COMPREHENSIVE METABOLIC PANEL
ALK PHOS: 64 U/L (ref 38–126)
ALT: 35 U/L (ref 14–54)
AST: 43 U/L — ABNORMAL HIGH (ref 15–41)
Albumin: 4.2 g/dL (ref 3.5–5.0)
Anion gap: 12 (ref 5–15)
BILIRUBIN TOTAL: 0.4 mg/dL (ref 0.3–1.2)
BUN: 18 mg/dL (ref 6–20)
CALCIUM: 9.3 mg/dL (ref 8.9–10.3)
CO2: 22 mmol/L (ref 22–32)
CREATININE: 1.21 mg/dL — AB (ref 0.44–1.00)
Chloride: 103 mmol/L (ref 101–111)
GFR, EST AFRICAN AMERICAN: 52 mL/min — AB (ref >60)
GFR, EST NON AFRICAN AMERICAN: 45 mL/min — AB (ref >60)
Glucose, Bld: 256 mg/dL — ABNORMAL HIGH (ref 65–99)
Potassium: 4.2 mmol/L (ref 3.5–5.1)
Sodium: 137 mmol/L (ref 135–145)
TOTAL PROTEIN: 7.4 g/dL (ref 6.5–8.1)

## 2016-04-12 LAB — TYPE AND SCREEN
ABO/RH(D): A POS
ANTIBODY SCREEN: NEGATIVE

## 2016-04-12 IMAGING — DX DG CHEST 2V
2 series · 2 of 2 positions shown · non-contrast
Comparison: [DATE]

CLINICAL DATA: Preoperative evaluation for left mastectomy. Left
breast cancer.

EXAM:
CHEST  2 VIEW

[chest pa]
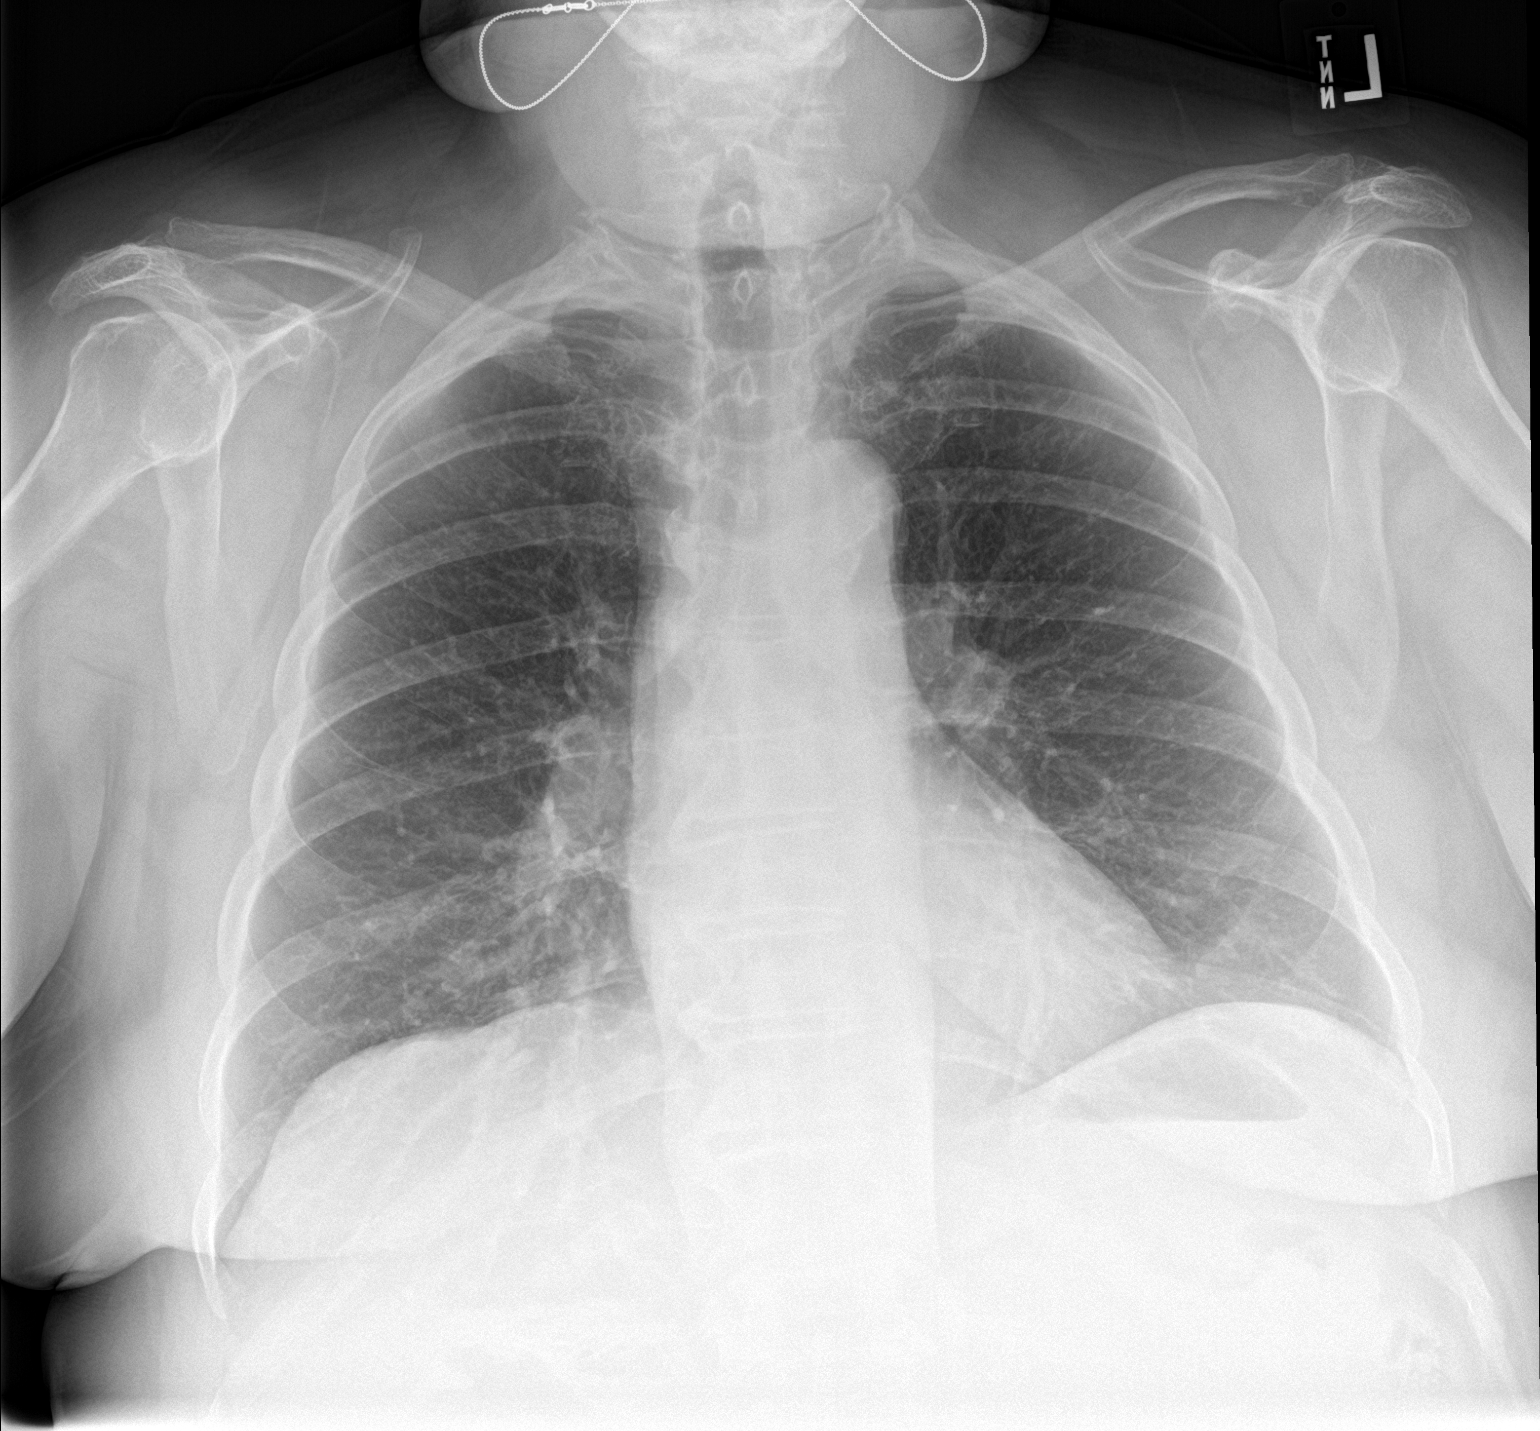

[chest lat]
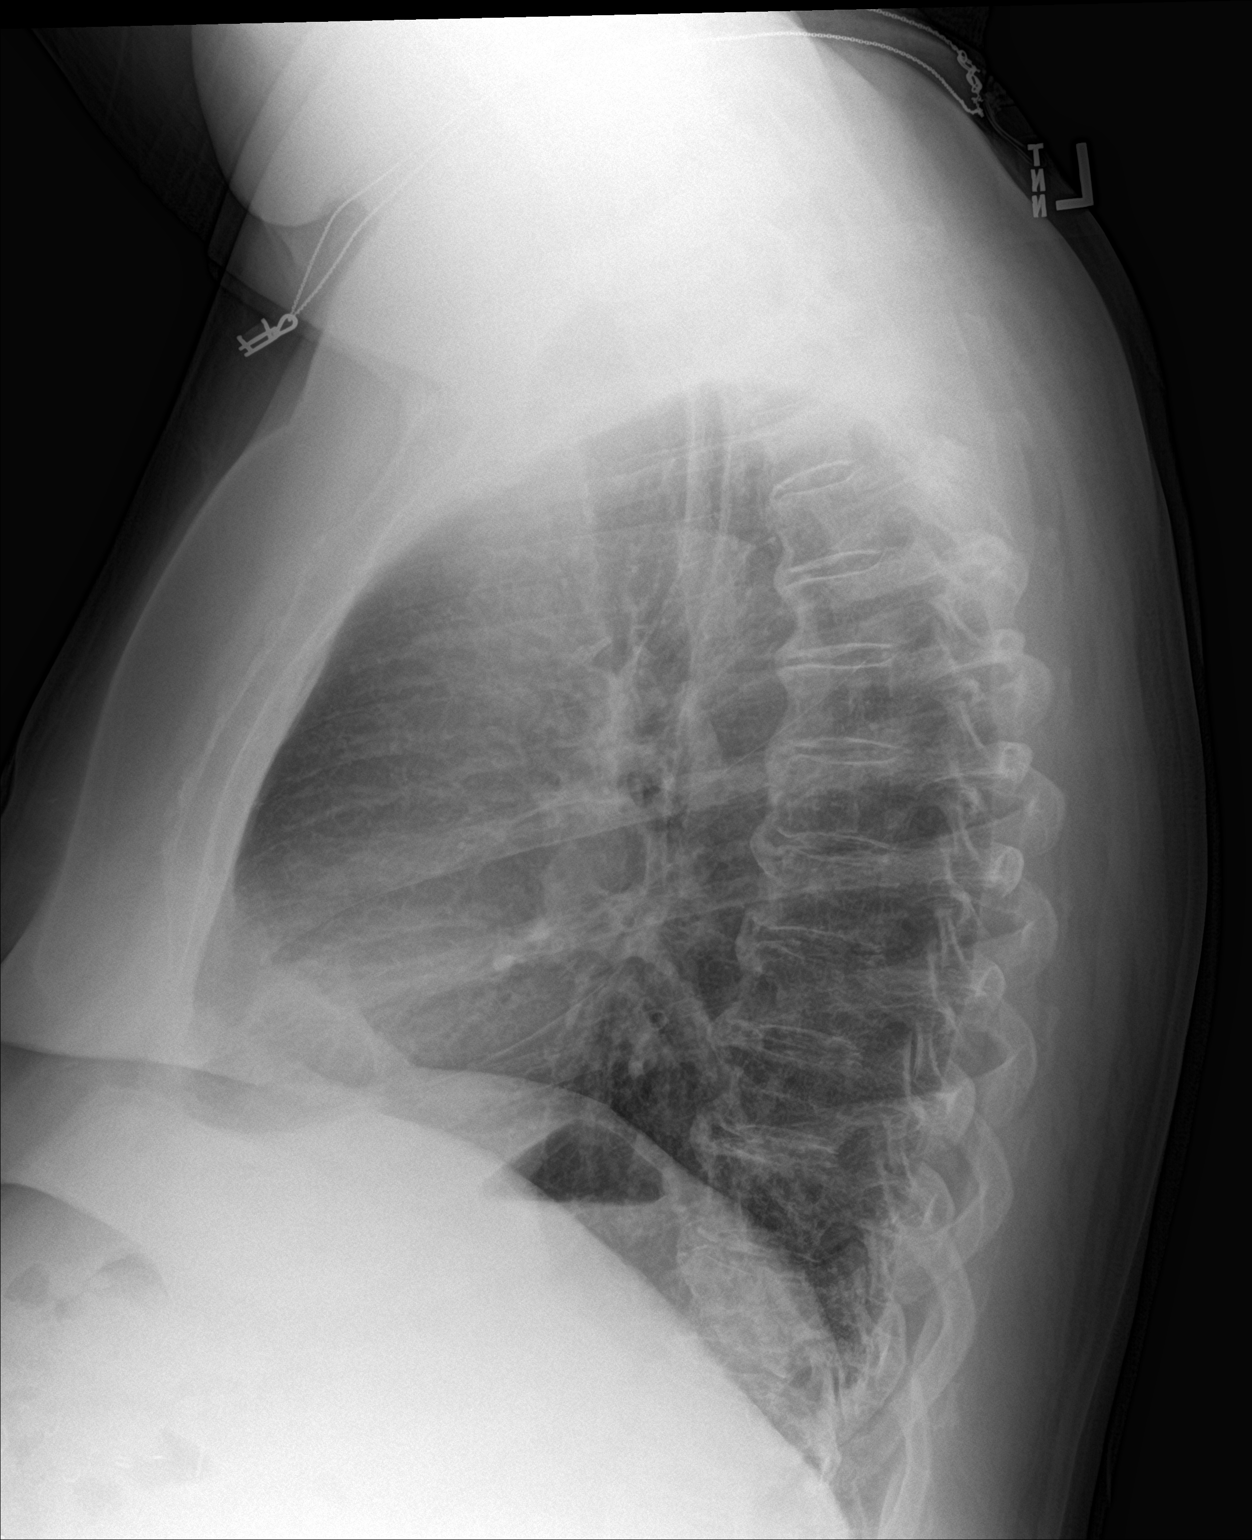

[2 of 2 positions shown; findings below may reference images not displayed]

FINDINGS: Heart size is normal. Mild atherosclerosis of the aorta. The lungs
are clear. The vascularity is normal. No effusions. Ordinary
degenerative changes affect the spine.
IMPRESSION: No active cardiopulmonary disease.

## 2016-04-12 NOTE — Pre-Procedure Instructions (Signed)
Dr Arnoldo Morale notified of WBC 16.1.  No further orders taken.

## 2016-04-12 NOTE — H&P (Signed)
  [facility] SOAP Note  Vital Signs:  Vitals as of: [id number]: Systolic [id number]: Diastolic 82: Heart Rate 107: Temp 97.24F (Temporal): Height 22ft 5in: Weight 201Lbs 0 Ounces: BMI 33.45   BMI : 33.45 kg/m2  Subjective: This 66 year old female presents for of left breast cancer with mets to axilla.  Was found on workup for left breast mass.  [facility].  Patient is schizophrenic, so POA is present with patient.  History limited.  Is aware of cancer diagnosis.  No immediate family history of breast cancer.  Review of Symptoms:  Constitutional:fatigue tremors Eyes:negative Nose/Mouth/Throat:negative Cardiovascular:negative Respiratory:dyspnea, cough Gastrointestinnegative Genitourinary:frequency Musculoskeletal:negative Skin:negative as above Hematolgic/Lymphatic:negative Allergic/Immunologic:negative   Past Medical History:Reviewed  Past Medical History  Medical Problems: IDDM, HTN, high cholesterol, schizophrenia Allergies: keflex, sulfa, latex Medications: baby asa, allopurinol, thorizine, levothyroxine, remeron, prilosec, zocor, restoril, zanaflex, invokana, victoza, levemir, novolog   Social History:Reviewed  Social History  Preferred Language: English Race:  White Ethnicity: Not Hispanic / Latino Age: 51 year Marital Status:  M Alcohol: no   Smoking Status: Never smoker reviewed on 04/05/2016 Functional Status reviewed on 04/05/2016 ------------------------------------------------ Bathing: Normal Cooking: Normal Dressing: Normal Driving: Normal Eating: Normal Managing Meds: Normal Oral Care: Normal Shopping: Normal Toileting: Normal Transferring: Normal Walking: Normal Cognitive Status reviewed on 04/05/2016 ------------------------------------------------ Attention: Normal Decision Making: Normal Language: Normal Memory: Normal Motor: Normal Perception: Normal Problem Solving: Normal Visual and Spatial: Normal   Family  History:Reviewed  Family Health History Mother, Living; Vaginal cancer; Heart failure;  Father, Living; Heart disease;     Objective Information: General:Well appearing, well nourished in no distress. Head:Atraumatic; no masses; no abnormalities Neck:Supple without lymphadenopathy.  Heart:RRR, no murmur or gallop.  Normal S1, S2.  No S3, S4.  Lungs:CTA bilaterally, no wheezes, rhonchi, rales.  Breathing unlabored. Dominant 3cm mass at the 10 o'clock position, left breast.  Ecchymoses present.  Palpable left axillary lymph node.  Right breast without dominant mass, nipple discharge, dimpling.  Axilla negative for palpable nodes. Mammogram and path reports reviewed. Assessment:Left breast cancer with mets to axilla  Diagnoses: 174.9  C50.912 Primary malignant neoplasm of female breast (Malignant neoplasm of unspecified site of left female breast)  Procedures: [phone] - OFFICE OUTPATIENT NEW 30 MINUTES    Plan:  Discussed options including left modified radical mastectomy vs partial mastectomy/axillary dissection/XRT.  Given patient's schizophrenia and limited mental capabilies, and difficulty in completing radiation therapy, patient and family agree to left MRM. They will call to schedule surgery once ecchymoses resolved.  Patient Education:Alternative treatments to surgery were discussed with patient (and family).Risks and benefits  of procedure incluidng bleeding, infection, and drain useage were fully explained to the patient (and family) who gave informed consent. Patient/family questions were addressed.  Follow-up:Pending Surgery

## 2016-04-12 NOTE — Patient Instructions (Addendum)
Margaret Mcdaniel  04/12/2016     @PREFPERIOPPHARMACY @   Your procedure is scheduled on 04/14/2016.  Report to [facility] at 7:50 A.M.  Call this number if you have problems the morning of surgery:  [phone]   Remember:  Do not eat food or drink liquids after midnight.  Take these medicines the morning of surgery with A SIP OF WATER  Allopurinol, Thorazine, Levothyroxine, Prilosec, Zanaflex  DO NOT TAKE MEDICATION FOR DIABETES MORNING OF SURGERY  1/2 OF EVENING DOSE OF INSULIN EVENING PRIOR TO SURGERY   Do not wear jewelry, make-up or nail polish.  Do not wear lotions, powders, or perfumes, or deoderant.  Do not shave 48 hours prior to surgery.  Men may shave face and neck.  Do not bring valuables to the hospital.  [facility] is not responsible for any belongings or valuables.  Contacts, dentures or bridgework may not be worn into surgery.  Leave your suitcase in the car.  After surgery it may be brought to your room.  For patients admitted to the hospital, discharge time will be determined by your treatment team.  Patients discharged the day of surgery will not be allowed to drive home.   Please read over the following fact sheets that you were given. Surgical Site Infection Prevention and Anesthesia Post-op Instructions     PATIENT INSTRUCTIONS POST-ANESTHESIA  IMMEDIATELY FOLLOWING SURGERY:  Do not drive or operate machinery for the first twenty four hours after surgery.  Do not make any important decisions for twenty four hours after surgery or while taking narcotic pain medications or sedatives.  If you develop intractable nausea and vomiting or a severe headache please notify your doctor immediately.  FOLLOW-UP:  Please make an appointment with your surgeon as instructed. You do not need to follow up with anesthesia unless specifically instructed to do so.  WOUND CARE INSTRUCTIONS (if applicable):  Keep a dry clean dressing on the anesthesia/puncture wound site if  there is drainage.  Once the wound has quit draining you may leave it open to air.  Generally you should leave the bandage intact for twenty four hours unless there is drainage.  If the epidural site drains for more than 36-48 hours please call the anesthesia department.  QUESTIONS?:  Please feel free to call your physician or the hospital operator if you have any questions, and they will be happy to assist you.      Total or Modified Radical Mastectomy A total mastectomy and a modified radical mastectomy are types of surgery for breast cancer. If you are having a total mastectomy (simple mastectomy), your entire breast will be removed. If you are having a modified radical mastectomy, your breast and nipple will be removed along with the lymph nodes under your arm. You may also have some of the lining over the muscle tissues under your breast removed. Let your health care provider know about:  Any allergies you have.  All medicines you are taking, including vitamins, herbs, eye drops, creams, and over-the-counter medicines.  Previous problems you or members of your family have had with the use of anesthetics.  Any blood disorders you have.  Any surgeries you have had.  Any medical conditions you have. What are the risks? Generally, this is a safe procedure. However, problems may occur, including:  Pain.  Infection.  Bleeding.  Scar tissue.  Chest numbness on the side of the surgery.  Fluid buildup under the skin flaps where your breast was removed (  seroma).  Sensation of throbbing or tingling.  Stress or sadness from losing your breast. If you have the lymph nodes under your arm removed, you may have arm swelling, weakness, or numbness on the same side of your body as your surgery. What happens before the procedure?  Ask your health care provider about:  Changing or stopping your regular medicines. This is especially important if you are taking diabetes medicines or blood  thinners.  Taking medicines such as aspirin and ibuprofen. These medicines can thin your blood. Do not take these medicines before your procedure if your health care provider instructs you not to.  Follow your health care provider's instructions about eating or drinking restrictions.  You may be checked for extra fluid around your lymph nodes (lymphedema).  Plan to have someone take you home after the procedure. What happens during the procedure?  An IV tube will be inserted into one of your veins.  You will be given a medicine that makes you fall asleep (general anesthetic).  Your breast will be cleaned with a germ-killing solution (antiseptic).  A wide incision will be made around your nipple. The skin and nipple inside the incision will be removed along with all breast tissue.  If you are having a modified radical mastectomy:  The lining over your chest muscles will be removed.  The incision may be extended to reach the lymph nodes under your arm, or a second incision may be made.  The lymph nodes will be removed.  You may have a drainage tube inserted into your incision to collect fluid that builds up after surgery. This tube is connected to a suction bulb.  Your incision or incisions will be closed with stitches (sutures).  A bandage (dressing) will be placed over your breast and under your arm. The procedure may vary among health care providers and hospitals. What happens after the procedure?  You will be moved to a recovery area.  Your blood pressure, heart rate, breathing rate, and blood oxygen level will be monitored often until the medicines you were given have worn off.  You will be given pain medicine as needed.  After a while, you will be taken to a hospital room.  You will be encouraged to get up and walk as soon as you can.  Your IV tube can be removed when you are able to eat and drink.  Your drain may be removed before you go home from the hospital, or  you may be sent home with your drain and suction bulb. This information is not intended to replace advice given to you by your health care provider. Make sure you discuss any questions you have with your health care provider. Document Released: 11/17/2000 Document Revised: 10/30/2015 Document Reviewed: 11/07/2013 Elsevier Interactive Patient Education  2017 [employer].

## 2016-04-14 ENCOUNTER — Encounter (HOSPITAL_COMMUNITY)
Admission: RE | Disposition: A | Discharge status: Home Health | Payer: Self-pay | Source: Ambulatory Visit | Attending: General Surgery

## 2016-04-14 ENCOUNTER — Inpatient Hospital Stay (HOSPITAL_COMMUNITY)
Admission: RE | Admit: 2016-04-14 | Discharge: 2016-04-16 | DRG: 580 | Disposition: A | Discharge status: Home Health | Payer: PPO | Source: Ambulatory Visit | Attending: General Surgery | Admitting: General Surgery

## 2016-04-14 ENCOUNTER — Inpatient Hospital Stay (HOSPITAL_COMMUNITY): Payer: PPO | Admitting: Anesthesiology

## 2016-04-14 ENCOUNTER — Encounter (HOSPITAL_COMMUNITY): Payer: Self-pay

## 2016-04-14 DIAGNOSIS — E119 Type 2 diabetes mellitus without complications: Secondary | ICD-10-CM | POA: Diagnosis present

## 2016-04-14 DIAGNOSIS — Z7982 Long term (current) use of aspirin: Secondary | ICD-10-CM

## 2016-04-14 DIAGNOSIS — E7800 Pure hypercholesterolemia, unspecified: Secondary | ICD-10-CM | POA: Diagnosis not present

## 2016-04-14 DIAGNOSIS — J449 Chronic obstructive pulmonary disease, unspecified: Secondary | ICD-10-CM | POA: Diagnosis not present

## 2016-04-14 DIAGNOSIS — E039 Hypothyroidism, unspecified: Secondary | ICD-10-CM | POA: Diagnosis present

## 2016-04-14 DIAGNOSIS — Z9981 Dependence on supplemental oxygen: Secondary | ICD-10-CM | POA: Diagnosis not present

## 2016-04-14 DIAGNOSIS — Z87891 Personal history of nicotine dependence: Secondary | ICD-10-CM | POA: Diagnosis not present

## 2016-04-14 DIAGNOSIS — I10 Essential (primary) hypertension: Secondary | ICD-10-CM | POA: Diagnosis present

## 2016-04-14 DIAGNOSIS — Z881 Allergy status to other antibiotic agents status: Secondary | ICD-10-CM

## 2016-04-14 DIAGNOSIS — Z808 Family history of malignant neoplasm of other organs or systems: Secondary | ICD-10-CM

## 2016-04-14 DIAGNOSIS — Z882 Allergy status to sulfonamides status: Secondary | ICD-10-CM

## 2016-04-14 DIAGNOSIS — C50912 Malignant neoplasm of unspecified site of left female breast: Secondary | ICD-10-CM | POA: Diagnosis present

## 2016-04-14 DIAGNOSIS — F209 Schizophrenia, unspecified: Secondary | ICD-10-CM | POA: Diagnosis present

## 2016-04-14 DIAGNOSIS — F79 Unspecified intellectual disabilities: Secondary | ICD-10-CM | POA: Diagnosis not present

## 2016-04-14 DIAGNOSIS — E109 Type 1 diabetes mellitus without complications: Secondary | ICD-10-CM | POA: Diagnosis not present

## 2016-04-14 DIAGNOSIS — G4730 Sleep apnea, unspecified: Secondary | ICD-10-CM | POA: Diagnosis not present

## 2016-04-14 DIAGNOSIS — Z794 Long term (current) use of insulin: Secondary | ICD-10-CM

## 2016-04-14 DIAGNOSIS — Z6833 Body mass index (BMI) 33.0-33.9, adult: Secondary | ICD-10-CM

## 2016-04-14 DIAGNOSIS — M79622 Pain in left upper arm: Secondary | ICD-10-CM

## 2016-04-14 DIAGNOSIS — C773 Secondary and unspecified malignant neoplasm of axilla and upper limb lymph nodes: Secondary | ICD-10-CM | POA: Diagnosis present

## 2016-04-14 DIAGNOSIS — Z79899 Other long term (current) drug therapy: Secondary | ICD-10-CM | POA: Diagnosis not present

## 2016-04-14 DIAGNOSIS — Z91040 Latex allergy status: Secondary | ICD-10-CM

## 2016-04-14 DIAGNOSIS — G4733 Obstructive sleep apnea (adult) (pediatric): Secondary | ICD-10-CM | POA: Diagnosis not present

## 2016-04-14 DIAGNOSIS — C50212 Malignant neoplasm of upper-inner quadrant of left female breast: Secondary | ICD-10-CM | POA: Diagnosis not present

## 2016-04-14 HISTORY — DX: Malignant neoplasm of unspecified site of left female breast: C50.912

## 2016-04-14 HISTORY — PX: MASTECTOMY MODIFIED RADICAL: SHX5962

## 2016-04-14 LAB — GLUCOSE, CAPILLARY
GLUCOSE-CAPILLARY: 160 mg/dL — AB (ref 65–99)
Glucose-Capillary: 125 mg/dL — ABNORMAL HIGH (ref 65–99)
Glucose-Capillary: 137 mg/dL — ABNORMAL HIGH (ref 65–99)

## 2016-04-14 SURGERY — MASTECTOMY, MODIFIED RADICAL
Anesthesia: General | Site: Breast | Laterality: Left

## 2016-04-14 MED ORDER — NEOSTIGMINE METHYLSULFATE 10 MG/10ML IV SOLN
INTRAVENOUS | Status: DC | PRN
  Administered 2016-04-14: 2 mg via INTRAVENOUS

## 2016-04-14 MED ORDER — DIPHENHYDRAMINE HCL 12.5 MG/5ML PO ELIX: 12.5000 mg | ORAL_SOLUTION | Freq: Four times a day (QID) | ORAL | Status: DC | PRN

## 2016-04-14 MED ORDER — CANAGLIFLOZIN 100 MG PO TABS
100.0000 mg | ORAL_TABLET | Freq: Every day | ORAL | Status: DC
  Administered 2016-04-14 – 2016-04-16 (×3): 100 mg via ORAL
  Filled 2016-04-14 (×6): qty 1

## 2016-04-14 MED ORDER — VANCOMYCIN HCL IN DEXTROSE 1-5 GM/200ML-% IV SOLN
INTRAVENOUS | Status: AC
Start: 2016-04-14 — End: 2016-04-14
  Filled 2016-04-14: qty 200

## 2016-04-14 MED ORDER — SUCCINYLCHOLINE CHLORIDE 20 MG/ML IJ SOLN
INTRAMUSCULAR | Status: AC
  Filled 2016-04-14: qty 1

## 2016-04-14 MED ORDER — GLYCOPYRROLATE 0.2 MG/ML IJ SOLN
INTRAMUSCULAR | Status: DC | PRN
  Administered 2016-04-14: 0.4 mg via INTRAVENOUS

## 2016-04-14 MED ORDER — DEXAMETHASONE SODIUM PHOSPHATE 4 MG/ML IJ SOLN
INTRAMUSCULAR | Status: AC
  Filled 2016-04-14: qty 1

## 2016-04-14 MED ORDER — NEOSTIGMINE METHYLSULFATE 10 MG/10ML IV SOLN
INTRAVENOUS | Status: AC
  Filled 2016-04-14: qty 1

## 2016-04-14 MED ORDER — FENTANYL CITRATE (PF) 100 MCG/2ML IJ SOLN
25.0000 ug | INTRAMUSCULAR | Status: DC | PRN
  Administered 2016-04-14: 50 ug via INTRAVENOUS
  Administered 2016-04-14: 25 ug via INTRAVENOUS
  Administered 2016-04-14: 50 ug via INTRAVENOUS
  Filled 2016-04-14 (×2): qty 2

## 2016-04-14 MED ORDER — SIMETHICONE 80 MG PO CHEW: 40.0000 mg | CHEWABLE_TABLET | Freq: Four times a day (QID) | ORAL | Status: DC | PRN

## 2016-04-14 MED ORDER — ACETAMINOPHEN 325 MG PO TABS: 650.0000 mg | ORAL_TABLET | Freq: Four times a day (QID) | ORAL | Status: DC | PRN

## 2016-04-14 MED ORDER — LACTATED RINGERS IV SOLN
INTRAVENOUS | Status: DC
  Administered 2016-04-14 (×2): via INTRAVENOUS

## 2016-04-14 MED ORDER — MIDAZOLAM HCL 2 MG/2ML IJ SOLN
INTRAMUSCULAR | Status: AC
  Filled 2016-04-14: qty 2

## 2016-04-14 MED ORDER — KETOROLAC TROMETHAMINE 30 MG/ML IJ SOLN
INTRAMUSCULAR | Status: AC
  Filled 2016-04-14: qty 1

## 2016-04-14 MED ORDER — LEVOTHYROXINE SODIUM 25 MCG PO TABS
50.0000 ug | ORAL_TABLET | Freq: Every day | ORAL | Status: DC
  Administered 2016-04-15 – 2016-04-16 (×2): 50 ug via ORAL
  Filled 2016-04-14 (×2): qty 2

## 2016-04-14 MED ORDER — ENOXAPARIN SODIUM 40 MG/0.4ML ~~LOC~~ SOLN
SUBCUTANEOUS | Status: AC
  Filled 2016-04-14: qty 0.4

## 2016-04-14 MED ORDER — ONDANSETRON HCL 4 MG/2ML IJ SOLN: 4.0000 mg | Freq: Four times a day (QID) | INTRAMUSCULAR | Status: DC | PRN

## 2016-04-14 MED ORDER — GLYCOPYRROLATE 0.2 MG/ML IJ SOLN
INTRAMUSCULAR | Status: AC
  Filled 2016-04-14: qty 2

## 2016-04-14 MED ORDER — HYDROCODONE-ACETAMINOPHEN 5-325 MG PO TABS
1.0000 | ORAL_TABLET | ORAL | Status: DC | PRN
  Administered 2016-04-15 (×2): 2 via ORAL
  Filled 2016-04-14 (×2): qty 2

## 2016-04-14 MED ORDER — CHLORHEXIDINE GLUCONATE CLOTH 2 % EX PADS
6.0000 | MEDICATED_PAD | Freq: Once | CUTANEOUS | Status: DC
  Administered 2016-04-14: 6 via TOPICAL

## 2016-04-14 MED ORDER — ROCURONIUM BROMIDE 100 MG/10ML IV SOLN
INTRAVENOUS | Status: DC | PRN
  Administered 2016-04-14: 5 mg via INTRAVENOUS
  Administered 2016-04-14: 20 mg via INTRAVENOUS

## 2016-04-14 MED ORDER — DEXAMETHASONE SODIUM PHOSPHATE 10 MG/ML IJ SOLN
INTRAMUSCULAR | Status: DC | PRN
  Administered 2016-04-14: 4 mg via INTRAVENOUS

## 2016-04-14 MED ORDER — VANCOMYCIN HCL IN DEXTROSE 1-5 GM/200ML-% IV SOLN
1000.0000 mg | INTRAVENOUS | Status: AC
  Administered 2016-04-14: 1000 mg via INTRAVENOUS

## 2016-04-14 MED ORDER — ONDANSETRON HCL 4 MG PO TABS: 4.0000 mg | ORAL_TABLET | Freq: Four times a day (QID) | ORAL | Status: DC | PRN

## 2016-04-14 MED ORDER — LABETALOL HCL 5 MG/ML IV SOLN
INTRAVENOUS | Status: AC
  Filled 2016-04-14: qty 4

## 2016-04-14 MED ORDER — ROCURONIUM BROMIDE 50 MG/5ML IV SOLN
INTRAVENOUS | Status: AC
  Filled 2016-04-14: qty 1

## 2016-04-14 MED ORDER — LABETALOL HCL 5 MG/ML IV SOLN
INTRAVENOUS | Status: DC | PRN
  Administered 2016-04-14 (×2): 5 mg via INTRAVENOUS

## 2016-04-14 MED ORDER — ALLOPURINOL 100 MG PO TABS
100.0000 mg | ORAL_TABLET | Freq: Every day | ORAL | Status: DC
  Administered 2016-04-15 – 2016-04-16 (×2): 100 mg via ORAL
  Filled 2016-04-14 (×2): qty 1

## 2016-04-14 MED ORDER — HYDROMORPHONE HCL 1 MG/ML IJ SOLN
0.5000 mg | INTRAMUSCULAR | Status: DC | PRN
  Administered 2016-04-14: 0.5 mg via INTRAVENOUS

## 2016-04-14 MED ORDER — DIPHENHYDRAMINE HCL 50 MG/ML IJ SOLN: 12.5000 mg | Freq: Four times a day (QID) | INTRAMUSCULAR | Status: DC | PRN

## 2016-04-14 MED ORDER — HYDROMORPHONE HCL 1 MG/ML IJ SOLN
1.0000 mg | INTRAMUSCULAR | Status: DC | PRN
  Administered 2016-04-14 – 2016-04-15 (×3): 1 mg via INTRAVENOUS
  Filled 2016-04-14 (×3): qty 1

## 2016-04-14 MED ORDER — GLYCOPYRROLATE 0.2 MG/ML IJ SOLN
0.2000 mg | Freq: Once | INTRAMUSCULAR | Status: AC
  Administered 2016-04-14: 0.2 mg via INTRAVENOUS
  Filled 2016-04-14: qty 1

## 2016-04-14 MED ORDER — MIDAZOLAM HCL 2 MG/2ML IJ SOLN
1.0000 mg | INTRAMUSCULAR | Status: AC
  Administered 2016-04-14 (×2): 2 mg via INTRAVENOUS
  Filled 2016-04-14: qty 2

## 2016-04-14 MED ORDER — ONDANSETRON HCL 4 MG/2ML IJ SOLN
INTRAMUSCULAR | Status: AC
  Filled 2016-04-14: qty 2

## 2016-04-14 MED ORDER — LABETALOL HCL 5 MG/ML IV SOLN
10.0000 mg | INTRAVENOUS | Status: DC | PRN
  Administered 2016-04-14: 10 mg via INTRAVENOUS

## 2016-04-14 MED ORDER — KETOROLAC TROMETHAMINE 30 MG/ML IJ SOLN
30.0000 mg | Freq: Once | INTRAMUSCULAR | Status: AC
  Administered 2016-04-14: 30 mg via INTRAVENOUS

## 2016-04-14 MED ORDER — TEMAZEPAM 15 MG PO CAPS
15.0000 mg | ORAL_CAPSULE | Freq: Every day | ORAL | Status: DC
  Administered 2016-04-14 – 2016-04-15 (×2): 15 mg via ORAL
  Filled 2016-04-14: qty 1

## 2016-04-14 MED ORDER — FENTANYL CITRATE (PF) 100 MCG/2ML IJ SOLN
INTRAMUSCULAR | Status: AC
  Filled 2016-04-14: qty 2

## 2016-04-14 MED ORDER — ACETAMINOPHEN 650 MG RE SUPP: 650.0000 mg | Freq: Four times a day (QID) | RECTAL | Status: DC | PRN

## 2016-04-14 MED ORDER — BUPIVACAINE HCL (PF) 0.5 % IJ SOLN
INTRAMUSCULAR | Status: AC
  Filled 2016-04-14: qty 30

## 2016-04-14 MED ORDER — INSULIN ASPART 100 UNIT/ML ~~LOC~~ SOLN
0.0000 [IU] | Freq: Three times a day (TID) | SUBCUTANEOUS | Status: DC
  Administered 2016-04-14: 3 [IU] via SUBCUTANEOUS
  Administered 2016-04-15: 15 [IU] via SUBCUTANEOUS
  Administered 2016-04-15: 5 [IU] via SUBCUTANEOUS
  Administered 2016-04-15 – 2016-04-16 (×2): 3 [IU] via SUBCUTANEOUS
  Administered 2016-04-16: 5 [IU] via SUBCUTANEOUS

## 2016-04-14 MED ORDER — POVIDONE-IODINE 10 % EX OINT
TOPICAL_OINTMENT | CUTANEOUS | Status: AC
  Filled 2016-04-14: qty 1

## 2016-04-14 MED ORDER — SUCCINYLCHOLINE CHLORIDE 20 MG/ML IJ SOLN
INTRAMUSCULAR | Status: DC | PRN
  Administered 2016-04-14: 120 mg via INTRAVENOUS

## 2016-04-14 MED ORDER — DIPHENHYDRAMINE HCL 50 MG/ML IJ SOLN
INTRAMUSCULAR | Status: AC
  Filled 2016-04-14: qty 1

## 2016-04-14 MED ORDER — TIZANIDINE HCL 4 MG PO TABS
8.0000 mg | ORAL_TABLET | Freq: Two times a day (BID) | ORAL | Status: DC
  Administered 2016-04-14 – 2016-04-16 (×4): 8 mg via ORAL
  Filled 2016-04-14 (×11): qty 2

## 2016-04-14 MED ORDER — PROPOFOL 10 MG/ML IV BOLUS
INTRAVENOUS | Status: DC | PRN
  Administered 2016-04-14: 120 mg via INTRAVENOUS

## 2016-04-14 MED ORDER — POVIDONE-IODINE 10 % OINT PACKET
TOPICAL_OINTMENT | CUTANEOUS | Status: DC | PRN
  Administered 2016-04-14: 1 via TOPICAL

## 2016-04-14 MED ORDER — METOPROLOL TARTRATE 5 MG/5ML IV SOLN: 10.0000 mg | Freq: Once | INTRAVENOUS | Status: DC

## 2016-04-14 MED ORDER — ENOXAPARIN SODIUM 40 MG/0.4ML ~~LOC~~ SOLN
40.0000 mg | SUBCUTANEOUS | Status: DC
  Administered 2016-04-15 – 2016-04-16 (×2): 40 mg via SUBCUTANEOUS
  Filled 2016-04-14 (×2): qty 0.4

## 2016-04-14 MED ORDER — PROPOFOL 10 MG/ML IV BOLUS
INTRAVENOUS | Status: AC
  Filled 2016-04-14: qty 40

## 2016-04-14 MED ORDER — HYDROMORPHONE HCL 1 MG/ML IJ SOLN
INTRAMUSCULAR | Status: AC
  Filled 2016-04-14: qty 0.5

## 2016-04-14 MED ORDER — LIDOCAINE HCL (CARDIAC) 10 MG/ML IV SOLN
INTRAVENOUS | Status: DC | PRN
  Administered 2016-04-14: 50 mg via INTRAVENOUS

## 2016-04-14 MED ORDER — DIPHENHYDRAMINE HCL 50 MG/ML IJ SOLN
INTRAMUSCULAR | Status: DC | PRN
  Administered 2016-04-14: 25 mg via INTRAVENOUS

## 2016-04-14 MED ORDER — ENOXAPARIN SODIUM 40 MG/0.4ML ~~LOC~~ SOLN
40.0000 mg | Freq: Once | SUBCUTANEOUS | Status: AC
  Administered 2016-04-14: 40 mg via SUBCUTANEOUS

## 2016-04-14 MED ORDER — LORAZEPAM 2 MG/ML IJ SOLN
1.0000 mg | INTRAMUSCULAR | Status: DC | PRN
  Administered 2016-04-14 (×2): 1 mg via INTRAVENOUS
  Filled 2016-04-14 (×2): qty 1

## 2016-04-14 MED ORDER — LIRAGLUTIDE 18 MG/3ML ~~LOC~~ SOPN: 1.8000 mg | PEN_INJECTOR | Freq: Every day | SUBCUTANEOUS | Status: DC

## 2016-04-14 MED ORDER — LACTATED RINGERS IV SOLN
INTRAVENOUS | Status: DC
  Administered 2016-04-14 – 2016-04-15 (×2): via INTRAVENOUS

## 2016-04-14 MED ORDER — CHLORPROMAZINE HCL 25 MG PO TABS: 50.0000 mg | ORAL_TABLET | Freq: Three times a day (TID) | ORAL | Status: DC | PRN

## 2016-04-14 MED ORDER — SODIUM CHLORIDE 0.9 % IR SOLN
Status: DC | PRN
  Administered 2016-04-14: 1000 mL

## 2016-04-14 MED ORDER — FENTANYL CITRATE (PF) 100 MCG/2ML IJ SOLN
INTRAMUSCULAR | Status: DC | PRN
  Administered 2016-04-14 (×2): 25 ug via INTRAVENOUS
  Administered 2016-04-14: 50 ug via INTRAVENOUS
  Administered 2016-04-14 (×4): 25 ug via INTRAVENOUS

## 2016-04-14 SURGICAL SUPPLY — 44 items
APPLIER CLIP 11 MED OPEN (CLIP) ×2
APPLIER CLIP 9.375 SM OPEN (CLIP)
BAG HAMPER (MISCELLANEOUS) ×2 IMPLANT
BINDER BREAST LRG (GAUZE/BANDAGES/DRESSINGS) IMPLANT
BINDER BREAST XLRG (GAUZE/BANDAGES/DRESSINGS) ×2 IMPLANT
CLIP APPLIE 11 MED OPEN (CLIP) ×1 IMPLANT
CLIP APPLIE 9.375 SM OPEN (CLIP) IMPLANT
CLOTH BEACON ORANGE TIMEOUT ST (SAFETY) ×2 IMPLANT
COVER LIGHT HANDLE STERIS (MISCELLANEOUS) ×4 IMPLANT
DRAPE PROXIMA HALF (DRAPES) IMPLANT
DRAPE UTILITY W/TAPE 26X15 (DRAPES) ×2 IMPLANT
DURAPREP 26ML APPLICATOR (WOUND CARE) ×2 IMPLANT
ELECT REM PT RETURN 9FT ADLT (ELECTROSURGICAL) ×2
ELECTRODE REM PT RTRN 9FT ADLT (ELECTROSURGICAL) ×1 IMPLANT
EVACUATOR DRAINAGE 10X20 100CC (DRAIN) ×1 IMPLANT
EVACUATOR SILICONE 100CC (DRAIN) ×1
GAUZE SPONGE 4X4 12PLY STRL (GAUZE/BANDAGES/DRESSINGS) ×2 IMPLANT
GLOVE BIOGEL PI IND STRL 7.0 (GLOVE) ×2 IMPLANT
GLOVE BIOGEL PI INDICATOR 7.0 (GLOVE) ×2
GLOVE SURG SS PI 7.0 STRL IVOR (GLOVE) ×2 IMPLANT
GLOVE SURG SS PI 7.5 STRL IVOR (GLOVE) ×4 IMPLANT
GOWN STRL REUS W/TWL LRG LVL3 (GOWN DISPOSABLE) ×6 IMPLANT
INST SET MINOR GENERAL (KITS) ×2 IMPLANT
KIT ROOM TURNOVER APOR (KITS) ×2 IMPLANT
MANIFOLD NEPTUNE II (INSTRUMENTS) ×2 IMPLANT
NS IRRIG 1000ML POUR BTL (IV SOLUTION) ×2 IMPLANT
PACK MINOR (CUSTOM PROCEDURE TRAY) ×2 IMPLANT
PAD ARMBOARD 7.5X6 YLW CONV (MISCELLANEOUS) ×2 IMPLANT
SET BASIN LINEN APH (SET/KITS/TRAYS/PACK) ×2 IMPLANT
SPONGE DRAIN TRACH 4X4 STRL 2S (GAUZE/BANDAGES/DRESSINGS) ×2 IMPLANT
SPONGE INTESTINAL PEANUT (DISPOSABLE) IMPLANT
SPONGE LAP 18X18 X RAY DECT (DISPOSABLE) ×4 IMPLANT
STAPLER VISISTAT (STAPLE) ×2 IMPLANT
SUT ETHILON 3 0 FSL (SUTURE) ×2 IMPLANT
SUT SILK 2 0 (SUTURE)
SUT SILK 2 0 SH (SUTURE) ×2 IMPLANT
SUT SILK 2-0 18XBRD TIE 12 (SUTURE) IMPLANT
SUT VIC AB 2-0 CT1 27 (SUTURE) ×2
SUT VIC AB 2-0 CT1 TAPERPNT 27 (SUTURE) ×2 IMPLANT
SUT VIC AB 3-0 SH 27 (SUTURE)
SUT VIC AB 3-0 SH 27X BRD (SUTURE) IMPLANT
SUT VICRYL AB 2 0 TIES (SUTURE) IMPLANT
SUT VICRYL AB 3 0 TIES (SUTURE) ×2 IMPLANT
TAPE CLOTH SURG 4X10 WHT LF (GAUZE/BANDAGES/DRESSINGS) ×2 IMPLANT

## 2016-04-14 NOTE — Anesthesia Postprocedure Evaluation (Signed)
Anesthesia Post Note  Patient: Margaret Mcdaniel  Procedure(s) Performed: Procedure(s) (LRB): LEFT MODIFIED RADICAL MASTECTOMY (Left)  Patient location during evaluation: PACU Anesthesia Type: General Level of consciousness: awake Pain management: satisfactory to patient Vital Signs Assessment: post-procedure vital signs reviewed and stable Anesthetic complications: no     Last Vitals:  Vitals:   04/14/16 1215 04/14/16 1225  BP: (!) 156/100   Pulse: 74 72  Resp: 11 (!) 8  Temp:      Last Pain:  Vitals:   04/14/16 1225  TempSrc:   PainSc: Asleep                 Wacey Zieger

## 2016-04-14 NOTE — Interval H&P Note (Signed)
History and Physical Interval Note:  04/14/2016 9:02 AM  [name]  has presented today for surgery, with the diagnosis of left breast cancer  The various methods of treatment have been discussed with the patient and family. After consideration of risks, benefits and other options for treatment, the patient has consented to  Procedure(s): MASTECTOMY MODIFIED RADICAL (Left) as a surgical intervention .  The patient's history has been reviewed, patient examined, no change in status, stable for surgery.  I have reviewed the patient's chart and labs.  Questions were answered to the patient's satisfaction.     Aviva Signs A

## 2016-04-14 NOTE — Anesthesia Procedure Notes (Signed)
Procedure Name: Intubation Date/Time: 04/14/2016 9:35 AM Performed by: Vista Deck Pre-anesthesia Checklist: Patient identified, Emergency Drugs available, Suction available, Patient being monitored and Timeout performed Patient Re-evaluated:Patient Re-evaluated prior to inductionOxygen Delivery Method: Circle system utilized Preoxygenation: Pre-oxygenation with 100% oxygen Intubation Type: IV induction Ventilation: Mask ventilation without difficulty Laryngoscope Size: Glidescope and 4 (Lopro) Grade View: Grade I Tube type: Oral Tube size: 7.0 mm Number of attempts: 1 Airway Equipment and Method: Video-laryngoscopy,  Stylet and Oral airway Placement Confirmation: ETT inserted through vocal cords under direct vision,  positive ETCO2 and breath sounds checked- equal and bilateral Secured at: 22 cm Tube secured with: Tape Dental Injury: Teeth and Oropharynx as per pre-operative assessment  Comments: Intubation performed by Dr. Garnet Sierras

## 2016-04-14 NOTE — Anesthesia Preprocedure Evaluation (Signed)
Anesthesia Evaluation  Patient identified by MRN, date of birth, ID band Patient awake    Reviewed: Allergy & Precautions, NPO status , Patient's Chart, lab work & pertinent test results  Airway Mallampati: III  TM Distance: <3 FB Neck ROM: Full  Mouth opening: Limited Mouth Opening  Dental  (+) Teeth Intact   Pulmonary sleep apnea and Continuous Positive Airway Pressure Ventilation , COPD,  COPD inhaler and oxygen dependent, former smoker,    breath sounds clear to auscultation       Cardiovascular negative cardio ROS   Rhythm:Regular Rate:Normal     Neuro/Psych PSYCHIATRIC DISORDERS Anxiety    GI/Hepatic negative GI ROS,   Endo/Other  diabetes, Type 2Hypothyroidism   Renal/GU      Musculoskeletal   Abdominal   Peds  Hematology   Anesthesia Other Findings Pt took 10u novolog  at 0640 today, contrary to written instructions.  Reproductive/Obstetrics                             Anesthesia Physical Anesthesia Plan  ASA: III  Anesthesia Plan: General   Post-op Pain Management:    Induction: Intravenous  Airway Management Planned: Oral ETT and Video Laryngoscope Planned  Additional Equipment:   Intra-op Plan:   Post-operative Plan: Extubation in OR  Informed Consent: I have reviewed the patients History and Physical, chart, labs and discussed the procedure including the risks, benefits and alternatives for the proposed anesthesia with the patient or authorized representative who has indicated his/her understanding and acceptance.     Plan Discussed with:   Anesthesia Plan Comments:         Anesthesia Quick Evaluation

## 2016-04-14 NOTE — Op Note (Signed)
Patient:  Margaret Mcdaniel  DOB:  [birth year]  MRN:  [id number]   Preop Diagnosis:  Left breast carcinoma with metastatic disease to axilla  Postop Diagnosis:  Same  Procedure:  Left modified radical mastectomy  Surgeon:  Aviva Signs, M.D.  Anes:  Gen. endotracheal  Indications:  Patient is a 66 year old white female who presents with an invasive ductal carcinoma left breast with metastatic disease to the left axilla. The risks and benefits of the procedure including bleeding, infection, nerve injury, left arm swelling, and the possibility of a blood transfusion were fully explained to the patient and her power of attorney, who gave informed consent. Patient does suffer from schizophrenia and mental deficiency.  Procedure note:  The patient was placed in the supine position. After induction of general endotracheal anesthesia, the left breast and axilla were prepped and draped using usual sterile technique with DuraPrep. Surgical site confirmation was performed.  An elliptical incision was made medial to lateral around the left nipple. A superior flap was then formed to the left clavicle and an inferior flap performed to the chest wall. The tumor was along the medial aspect of the incision. It was palpable and I did resect further soft tissue just anterior to the mass and close to the skin. There did not appear to be any gross skin involvement. A short suture was placed superiorly and a long suture placed laterally for orientation purposes. The left breast was removed and sent to pathology for further examination. The patient was noted to have multiple matted lymph nodes within the left axilla. These were removed en bloc without difficulty. Again bleeding was controlled using clips. The left axillary lymph nodes were removed and sent to pathology for further examination. Care was taken to avoid the long thoracic and dorsal artery, vein, and nerve. The long thoracic nerve was intact.  The wound was  irrigated with normal saline. The bleeding was controlled using Bovie electrocautery. A #10 flat Jackson-Pratt drain was placed along the flap and up to the axilla and brought through a separate stab wound. It was secured at the skin and all using 3-0 nylon interrupted suture. Subcutaneous layer was reapproximated using a 2-0 Vicryl interrupted suture. The skin was closed using staples. Betadine ointment and a dry sterile dressing were applied as well as a mastectomy binder.  All tape and needle counts were correct at the end the procedure. The patient was extubated in the operating room and transferred to PACU in stable condition.  Complications:  None  EBL:  150 mL  Specimen:  Left breast, short suture superior, long suture lateral Left axillary lymph nodes

## 2016-04-14 NOTE — Transfer of Care (Signed)
Immediate Anesthesia Transfer of Care Note  Patient: Margaret Mcdaniel  Procedure(s) Performed: Procedure(s): LEFT MODIFIED RADICAL MASTECTOMY (Left)  Patient Location: PACU  Anesthesia Type:General  Level of Consciousness: awake and patient cooperative  Airway & Oxygen Therapy: Patient Spontanous Breathing and non-rebreather face mask  Post-op Assessment: Report given to RN and Post -op Vital signs reviewed and stable  Post vital signs: Reviewed and stable  Last Vitals:  Vitals:   04/14/16 0915 04/14/16 0920  BP:    Pulse:    Resp: (!) 24 19  Temp:      Last Pain:  Vitals:   04/14/16 0807  TempSrc: Oral  PainSc: 8       Patients Stated Pain Goal: 5 ([id number])  Complications: No apparent anesthesia complications

## 2016-04-15 ENCOUNTER — Encounter (HOSPITAL_COMMUNITY): Payer: Self-pay | Admitting: General Surgery

## 2016-04-15 LAB — CBC
HCT: 35.3 % — ABNORMAL LOW (ref 36.0–46.0)
HEMOGLOBIN: 11.4 g/dL — AB (ref 12.0–15.0)
MCH: 28.4 pg (ref 26.0–34.0)
MCHC: 32.3 g/dL (ref 30.0–36.0)
MCV: 88 fL (ref 78.0–100.0)
PLATELETS: 216 10*3/uL (ref 150–400)
RBC: 4.01 MIL/uL (ref 3.87–5.11)
RDW: 15.5 % (ref 11.5–15.5)
WBC: 15.2 10*3/uL — ABNORMAL HIGH (ref 4.0–10.5)

## 2016-04-15 LAB — BASIC METABOLIC PANEL
Anion gap: 9 (ref 5–15)
BUN: 18 mg/dL (ref 6–20)
CHLORIDE: 102 mmol/L (ref 101–111)
CO2: 25 mmol/L (ref 22–32)
CREATININE: 1.16 mg/dL — AB (ref 0.44–1.00)
Calcium: 8.6 mg/dL — ABNORMAL LOW (ref 8.9–10.3)
GFR calc non Af Amer: 48 mL/min — ABNORMAL LOW (ref >60)
GFR, EST AFRICAN AMERICAN: 55 mL/min — AB (ref >60)
Glucose, Bld: 215 mg/dL — ABNORMAL HIGH (ref 65–99)
Potassium: 3.9 mmol/L (ref 3.5–5.1)
SODIUM: 136 mmol/L (ref 135–145)

## 2016-04-15 LAB — GLUCOSE, CAPILLARY
GLUCOSE-CAPILLARY: 224 mg/dL — AB (ref 65–99)
GLUCOSE-CAPILLARY: 229 mg/dL — AB (ref 65–99)
GLUCOSE-CAPILLARY: 313 mg/dL — AB (ref 65–99)
GLUCOSE-CAPILLARY: 363 mg/dL — AB (ref 65–99)
Glucose-Capillary: 195 mg/dL — ABNORMAL HIGH (ref 65–99)

## 2016-04-15 LAB — MRSA PCR SCREENING: MRSA BY PCR: NEGATIVE

## 2016-04-15 MED ORDER — INSULIN DETEMIR 100 UNIT/ML ~~LOC~~ SOLN
24.0000 [IU] | SUBCUTANEOUS | Status: DC
Start: 2016-04-15 — End: 2016-04-16
  Administered 2016-04-15 – 2016-04-16 (×2): 24 [IU] via SUBCUTANEOUS
  Filled 2016-04-15 (×4): qty 0.24

## 2016-04-15 MED ORDER — INSULIN ASPART 100 UNIT/ML ~~LOC~~ SOLN
11.0000 [IU] | Freq: Once | SUBCUTANEOUS | Status: AC
  Administered 2016-04-15: 11 [IU] via SUBCUTANEOUS

## 2016-04-15 MED ORDER — CHLORPROMAZINE HCL 25 MG PO TABS
50.0000 mg | ORAL_TABLET | Freq: Three times a day (TID) | ORAL | Status: DC
  Administered 2016-04-15 – 2016-04-16 (×3): 50 mg via ORAL
  Filled 2016-04-15 (×3): qty 2

## 2016-04-15 MED ORDER — OXYCODONE-ACETAMINOPHEN 5-325 MG PO TABS
1.0000 | ORAL_TABLET | ORAL | Status: DC | PRN
  Administered 2016-04-15 – 2016-04-16 (×6): 2 via ORAL
  Filled 2016-04-15 (×6): qty 2

## 2016-04-15 MED ORDER — INSULIN ASPART 100 UNIT/ML ~~LOC~~ SOLN
6.0000 [IU] | Freq: Three times a day (TID) | SUBCUTANEOUS | Status: DC
  Administered 2016-04-15 – 2016-04-16 (×3): 6 [IU] via SUBCUTANEOUS

## 2016-04-15 NOTE — Progress Notes (Signed)
1 Day Post-Op  Subjective: Patient complains of incisional pain.  Objective: Vital signs in last 24 hours: Temp:  [97.5 F (36.4 C)-99 F (37.2 C)] 99 F (37.2 C) (02/08 0700) Pulse Rate:  [72-91] 74 (02/08 0700) Resp:  [8-21] 12 (02/08 0700) BP: (91-176)/(61-105) 107/61 (02/08 0700) SpO2:  [92 %-100 %] 92 % (02/08 0700) FiO2 (%):  [2 %] 2 % (02/07 1505) Weight:  [95.7 kg (210 lb 15.7 oz)-96.3 kg (212 lb 4.9 oz)] 96.3 kg (212 lb 4.9 oz) (02/08 0500) Last BM Date: 04/13/16  Intake/Output from previous day: 02/07 0701 - 02/08 0700 In: 3190 [P.O.:630; I.V.:2560] Out: 1045 [Urine:700; Drains:195; Blood:150] Intake/Output this shift: Total I/O In: 240 [P.O.:240] Out: 300 [Urine:300]  General appearance: alert, cooperative and no distress Resp: clear to auscultation bilaterally Breasts: Incision site without hematoma. Healing well. JP drainage sanguinous in nature. Cardio: regular rate and rhythm, S1, S2 normal, no murmur, click, rub or gallop  Lab Results:   Recent Labs  04/12/16 1439 04/15/16 0415  WBC 16.1* 15.2*  HGB 14.4 11.4*  HCT 43.3 35.3*  PLT 243 216   BMET  Recent Labs  04/12/16 1439 04/15/16 0415  NA 137 136  K 4.2 3.9  CL 103 102  CO2 22 25  GLUCOSE 256* 215*  BUN 18 18  CREATININE 1.21* 1.16*  CALCIUM 9.3 8.6*   PT/INR No results for input(s): LABPROT, INR in the last 72 hours.  Studies/Results: No results found.  Anti-infectives: Anti-infectives    Start     Dose/Rate Route Frequency Ordered Stop   04/14/16 0824  vancomycin (VANCOCIN) IVPB 1000 mg/200 mL premix     1,000 mg 200 mL/hr over 60 Minutes Intravenous On call to O.R. 04/14/16 [id number] 04/14/16 0946      Assessment/Plan: s/p Procedure(s): LEFT MODIFIED RADICAL MASTECTOMY Impression: Stable on postoperative day 1. Patient in stepdown unit due to CPAP use in the evening. Blood glucoses being controlled with sliding scale insulin. We'll get PT consultation for evaluation and  treatment of left arm range of motion. We'll get home health involvement. Anticipate discharge tomorrow.  LOS: 1 day    Margaret Mcdaniel A 04/15/2016

## 2016-04-15 NOTE — Progress Notes (Addendum)
Inpatient Diabetes Program Recommendations  AACE/ADA: New Consensus Statement on Inpatient Glycemic Control (2015)  Target Ranges:  Prepandial:   less than 140 mg/dL      Peak postprandial:   less than 180 mg/dL (1-2 hours)      Critically ill patients:  140 - 180 mg/dL   Results for VENDA, GIECK (MRN [id number]) as of 04/15/2016 14:08  Ref. Range 04/14/2016 07:55 04/14/2016 08:56 04/14/2016 11:06 04/15/2016 00:35 04/15/2016 07:22 04/15/2016 11:45  Glucose-Capillary Latest Ref Range: 65 - 99 mg/dL 125 (H) 137 (H) 160 (H) 224 (H) 195 (H) 363 (H)   Review of Glycemic Control  Diabetes history: DM2 Outpatient Diabetes medications: Levemir 75 units BID, Novolog 20 units TID with meals, Victoza 1.8 mg daily, Invokana 100 mg daily Current orders for Inpatient glycemic control: Novolog 0-15 units TID with meals, Victoza 1.8 mg daily, Invokana 100 mg daily  Inpatient Diabetes Program Recommendations: Insulin - Basal: Please consider ordering Levemir 24 units Q24H (starting now, based on 96 kg x 0.25 units). Insulin - Meal Coverage: Please consider ordering Novolog 6 units TID with meals for meal coverage (in addition to Novolog correction scale).  Addendum 04/15/16@15 :23-Spoke with patient and her son about diabetes and home regimen for diabetes control. Patient reports that she is in between doctors at this time as her PCP moved to a different county and her insurance does not cover doctors in that particular county. Patient states that she is suppose to see Dr. Wenda Overland in [place] but has not seen him yet to establish care with him. Patient reports that she is taking DM medications as prescribed and is consistent with taking DM medications. However, her son states that she does not consistently take medications as prescribed and does not have a set schedule of taking medications, eating, or sleeping as she stays in bed a lot (per son).  Patient states that she checks her glucose 1-3 times per day and that it is usually  in the mid to upper 100's mg/dl.  Patient's son reports that her last A1C was 8.1% about 1 month ago.CBG up to 363 mg/dl at 11:45 today.  Explained to patient that she has not received any Levemir since being admitted and she also received a one time dose of Decadron 4 mg on 04/14/16 which is contributing to hyperglycemia. Informed patient that new orders have been ordered by Dr. Arnoldo Morale to start a portion of Levemir and meal coverage and glucose trends will be followed and recommendations will be made for insulin adjustments if needed.  Encouraged patient to establish care with new PCP, continue to monitor glucose at home, and establish a routine for taking medications.  Inquired about seeing an Endocrinologist and patient and her son report that she has seen Dr. Dorris Fetch in the past but she had a difficult time understanding him and does not wish to see him again. Encouraged patient to consider seeing an Endocrinologist for assistance with DM management.  Patient verbalized understanding of information discussed and she states that she has no further questions at this time related to diabetes.  Thanks, Barnie Alderman, RN, MSN, CDE Diabetes Coordinator Inpatient Diabetes Program [phone] (Team Pager from 8am to 5pm)

## 2016-04-15 NOTE — Evaluation (Signed)
Physical Therapy Evaluation Patient Details Name: Margaret Mcdaniel MRN: [id number] DOB: Sep 12, 1950 Today's Date: 04/15/2016   History of Present Illness  66 year old female presents for  of left breast cancer with mets to axilla.  Was found on workup for left breast mass.  [facility].  Patient is schizophrenic, so POA is present with patient.  History limited.  Is aware of cancer diagnosis.  No immediate family history of breast cancer.  Pt is s/p Left modified radical mastectomy on 04/14/2016.    Clinical Impression  Pt received in bed, son and dtr in law also present during PT evaluation.  Pt reported that she is normally independent with household ambulation, as well as independent with dressing and bathing.  During PT evaluation she ambulated 290f with no device, but with supervision due to some slight unsteadiness.  Family states that she is slightly unsteady at home.  Pt and family were educated on UE exercises to prevent lymphedema after surgery.  At this point, recommend HHPT to improve pt's functional mobility as well as encourage L UE exercises to prevent lymphedema.      Follow Up Recommendations Home health PT    Equipment Recommendations  None recommended by PT    Recommendations for Other Services       Precautions / Restrictions Precautions Precautions: Fall Precaution Comments: 1 where she slipped out of the bed.  Restrictions Weight Bearing Restrictions: No      Mobility  Bed Mobility Overal bed mobility: Needs Assistance Bed Mobility: Supine to Sit     Supine to sit: HOB elevated;Supervision;Min guard     General bed mobility comments: Pt requires increased time for transfer supine<>sit.    Transfers Overall transfer level: Modified independent Equipment used: None                Ambulation/Gait Ambulation/Gait assistance: Supervision Ambulation Distance (Feet): 200 Feet Assistive device: None Gait Pattern/deviations: Step-through  pattern;Antalgic   Gait velocity interpretation: <1.8 ft/sec, indicative of risk for recurrent falls General Gait Details: Pt is mildly unsteady with antalgic gait.  Pt states she feels like she is ambulating at baseline, however son and dtr in law both state that she is slightly more unsteady that usual.    Stairs            Wheelchair Mobility    Modified Rankin (Stroke Patients Only)       Balance Overall balance assessment: Needs assistance Sitting-balance support: Bilateral upper extremity supported;Feet unsupported Sitting balance-Leahy Scale: Normal     Standing balance support: No upper extremity supported Standing balance-Leahy Scale: Good                               Pertinent Vitals/Pain Pain Assessment: 0-10 Pain Score: 8  Pain Location: L breast  Pain Descriptors / Indicators: Burning Pain Intervention(s): Limited activity within patient's tolerance;Monitored during session;Repositioned    Home Living   Living Arrangements: Alone   Type of Home: House Home Access: Stairs to enter   [employer] Steps: 9 steps with railing.   Home Layout: One level Home Equipment: None      Prior Function Level of Independence: Independent         Comments: Pt does not drive.  Pt's son will assist with errands.  Pt states she gives him a list of things to do.       Hand Dominance   Dominant  Hand: Right    Extremity/Trunk Assessment   Upper Extremity Assessment Upper Extremity Assessment: Generalized weakness LUE Deficits / Details: pain with AAROM    Lower Extremity Assessment Lower Extremity Assessment: Overall WFL for tasks assessed       Communication   Communication: No difficulties  Cognition Arousal/Alertness: Awake/alert Behavior During Therapy: WFL for tasks assessed/performed Overall Cognitive Status: Within Functional Limits for tasks assessed                      General Comments       Exercises General Exercises - Upper Extremity Shoulder Flexion: Both;5 reps;Seated;Self ROM Shoulder Horizontal ABduction: Self ROM;5 reps;Both;Seated Other Exercises Other Exercises: wall walking for shoulder flexion x 5 reps Other Exercises: B shoulder external rotation  with scapular retraction x 5 reps.    Assessment/Plan    PT Assessment All further PT needs can be met in the next venue of care  PT Problem List Decreased strength;Decreased range of motion;Decreased activity tolerance;Decreased balance;Decreased mobility;Decreased safety awareness;Decreased knowledge of precautions;Cardiopulmonary status limiting activity;Pain;Decreased skin integrity;Obesity          PT Treatment Interventions      PT Goals (Current goals can be found in the Care Plan section)  Acute Rehab PT Goals PT Goal Formulation: All assessment and education complete, DC therapy    Frequency     Barriers to discharge        Co-evaluation               End of Session Equipment Utilized During Treatment: Gait belt;Oxygen Activity Tolerance: Patient tolerated treatment well Patient left: in chair;with call bell/phone within reach;with family/visitor present Nurse Communication: Mobility status (Crystal, RN notified of pt's mobiltiy status, and mobiltiy sheet left hanging in the room. )    Functional Assessment Tool Used: [employer] "6-clicks"  Functional Limitation: Mobility: Walking and moving around Mobility: Walking and Moving Around Current Status [phone]): At least 20 percent but less than 40 percent impaired, limited or restricted Mobility: Walking and Moving Around Goal Status [phone]): At least 1 percent but less than 20 percent impaired, limited or restricted Mobility: Walking and Moving Around Discharge Status [phone]): At least 1 percent but less than 20 percent impaired, limited or restricted    Time: 1346-1429 PT Time Calculation (min) (ACUTE ONLY): 43  min   Charges:   PT Evaluation $PT Eval Low Complexity: 1 Procedure PT Treatments $Gait Training: 8-22 mins $Therapeutic Exercise: 8-22 mins   PT G Codes:   PT G-Codes **NOT FOR INPATIENT CLASS** Functional Assessment Tool Used: [employer] "6-clicks"  Functional Limitation: Mobility: Walking and moving around Mobility: Walking and Moving Around Current Status [phone]): At least 20 percent but less than 40 percent impaired, limited or restricted Mobility: Walking and Moving Around Goal Status [phone]): At least 1 percent but less than 20 percent impaired, limited or restricted Mobility: Walking and Moving Around Discharge Status [phone]): At least 1 percent but less than 20 percent impaired, limited or restricted    Beth Dezhane Staten, PT, DPT X: [phone]

## 2016-04-15 NOTE — Care Management Note (Signed)
Case Management Note  Patient Details  Name: Margaret Mcdaniel MRN: [id number] Date of Birth: 02/02/1951  Subjective/Objective:     Patient adm from home, ind with ADL's. She has strong family support.   She is s/p radical mastectomy on 04/14/2016. She has oxygen at home provided by [place]. She has had HH in the past with [facility]. PT recommends HH PT. Patient/family would like [facility] again.    Action/Plan: CM notified  Brad of [facility], who will pull orders from chart. Will order RN, PT and SW. MD will need to co-sign orders and order face to face.   Expected Discharge Date:      04/16/2016            Expected Discharge Plan:  [place]  In-House Referral:     Discharge planning Services  CM Consult  Post Acute Care Choice:  Home Health Choice offered to:  Patient, Adult Children  DME Arranged:    DME Agency:     HH Arranged:  RN, PT, Nurse's Aide [place] Agency:  [place]  Status of Service:  In process, will continue to follow  If discussed at Long Length of Stay Meetings, dates discussed:    Additional Comments:  Ulis Kaps, Chauncey [place], RN 04/15/2016, 4:42 PM

## 2016-04-15 NOTE — Progress Notes (Signed)
1600 Patient request that all bed rails remain up while she is in bed.

## 2016-04-15 NOTE — Anesthesia Postprocedure Evaluation (Signed)
Anesthesia Post Note  Patient: Margaret Mcdaniel  Procedure(s) Performed: Procedure(s) (LRB): LEFT MODIFIED RADICAL MASTECTOMY (Left)  Patient location during evaluation: ICU Anesthesia Type: General Level of consciousness: awake and alert, oriented and patient cooperative Pain management: pain level controlled Vital Signs Assessment: post-procedure vital signs reviewed and stable Respiratory status: spontaneous breathing, nonlabored ventilation, respiratory function stable and patient connected to face mask oxygen Cardiovascular status: blood pressure returned to baseline Postop Assessment: no signs of nausea or vomiting Anesthetic complications: no     Last Vitals:  Vitals:   04/15/16 0600 04/15/16 0700  BP: 95/65 107/61  Pulse: 75 74  Resp: 11 12  Temp:  37.2 C    Last Pain:  Vitals:   04/15/16 0758  TempSrc:   PainSc: 7                  Sissi Padia J

## 2016-04-15 NOTE — Addendum Note (Signed)
Addendum  created 04/15/16 0919 by Charmaine Downs, CRNA   Sign clinical note

## 2016-04-16 LAB — CBC
HCT: 33.7 % — ABNORMAL LOW (ref 36.0–46.0)
HEMOGLOBIN: 11 g/dL — AB (ref 12.0–15.0)
MCH: 28.9 pg (ref 26.0–34.0)
MCHC: 32.6 g/dL (ref 30.0–36.0)
MCV: 88.5 fL (ref 78.0–100.0)
Platelets: 189 10*3/uL (ref 150–400)
RBC: 3.81 MIL/uL — ABNORMAL LOW (ref 3.87–5.11)
RDW: 15.6 % — ABNORMAL HIGH (ref 11.5–15.5)
WBC: 10.2 10*3/uL (ref 4.0–10.5)

## 2016-04-16 LAB — BASIC METABOLIC PANEL
Anion gap: 8 (ref 5–15)
BUN: 19 mg/dL (ref 6–20)
CHLORIDE: 106 mmol/L (ref 101–111)
CO2: 26 mmol/L (ref 22–32)
CREATININE: 1.15 mg/dL — AB (ref 0.44–1.00)
Calcium: 8.7 mg/dL — ABNORMAL LOW (ref 8.9–10.3)
GFR calc Af Amer: 56 mL/min — ABNORMAL LOW (ref >60)
GFR calc non Af Amer: 48 mL/min — ABNORMAL LOW (ref >60)
GLUCOSE: 183 mg/dL — AB (ref 65–99)
Potassium: 3.9 mmol/L (ref 3.5–5.1)
SODIUM: 140 mmol/L (ref 135–145)

## 2016-04-16 LAB — GLUCOSE, CAPILLARY
GLUCOSE-CAPILLARY: 180 mg/dL — AB (ref 65–99)
Glucose-Capillary: 155 mg/dL — ABNORMAL HIGH (ref 65–99)
Glucose-Capillary: 209 mg/dL — ABNORMAL HIGH (ref 65–99)

## 2016-04-16 MED ORDER — HYDROCODONE-ACETAMINOPHEN 5-325 MG PO TABS: 1.0000 | ORAL_TABLET | Freq: Four times a day (QID) | ORAL | 0 refills | Status: DC | PRN

## 2016-04-16 NOTE — Care Management Note (Signed)
Case Management Note  Patient Details  Name: ANNISHA MUHLBACH MRN: [id number] Date of Birth: [birth year]   Expected Discharge Date:  04/16/16               Expected Discharge Plan:  [place]  In-House Referral:     Discharge planning Services  CM Consult  Post Acute Care Choice:  Home Health Choice offered to:  Patient, Adult Children  HH Arranged:  RN, PT, Nurse's Aide [place] Agency:  [place]  Status of Service:  Completed, signed off  Additional Comments: Pt discharging home today with [facility] services. Family aware HH has 48hrs to make first visit. Family at bedside. Romualdo Bolk, of [facility], aware of DC today.   Sherald Barge, RN 04/16/2016, 1:46 PM

## 2016-04-16 NOTE — Discharge Instructions (Signed)
Total or Modified Radical Mastectomy, Care After Refer to this sheet in the next few weeks. These instructions provide you with information about caring for yourself after your procedure. Your health care provider may also give you more specific instructions. Your treatment has been planned according to current medical practices, but problems sometimes occur. Call your health care provider if you have any problems or questions after your procedure. What can I expect after the procedure? After your procedure, it is common to have:  Pain.  Numbness.  Stiffness in your arm or shoulder.  Feelings of stress, sadness, or depression. If the lymph nodes under your arm were removed, you may have arm swelling, weakness, or numbness on the same side of your body as your surgery. Follow these instructions at home: Incision care  There are many different ways to close and cover an incision, including stitches, skin glue, and adhesive strips. Follow your health care provider's instructions about:  Incision care.  Bandage (dressing) changes and removal.  Incision closure removal.  Check your incision area every day for signs of infection. Watch for:  Redness, swelling, or pain.  Fluid, blood, or pus.  If you were sent home with a surgical drain in place, follow your health care provider's instructions for emptying it. Bathing  Do not take baths, swim, or use a hot tub until your health care provider approves.  Take sponge baths until your health care provider says that you can start showering or bathing. Activity  Return to your normal activities as directed by your health care provider.  Avoid strenuous exercise.  Be careful to avoid any activities that could cause an injury to your arm on the side of your surgery.  Do not lift anything that is heavier than 10 lb (4.5 kg). Avoid lifting with the arm that is on the side of your surgery.  Do not carry heavy objects on your  shoulder.  After your drain is removed, you should perform exercises to keep your arm from getting stiff and swollen. Talk with your health care provider about which exercises are safe for you. General instructions  Take medicines only as directed by your health care provider.  You may eat what you usually do.  Keep your arm elevated when at rest.  Do not wear tight jewelry on your arm, wrist, or fingers on the side of your surgery.  Get checked for extra fluid around your lymph nodes (lymphedema) as often as told by your health care provider.  If you had a modified radical mastectomy, always let your health care providers know that lymph nodes under your arm were removed. This is important information to share before you are involved in certain procedures, such as giving blood or having your blood pressure taken. Contact a health care provider if:  You have a fever.  Your pain medicine is not working.  Your arm swelling, weakness, or numbness has not improved after a few weeks.  You have new swelling in your breast or arm.  You have redness, swelling, or pain in your incision area.  You have fluid, blood, or pus coming from your incision. Get help right away if:  You have very bad pain in your breast or arm.  You have chest pain.  You have difficulty breathing. This information is not intended to replace advice given to you by your health care provider. Make sure you discuss any questions you have with your health care provider. Document Released: 10/16/2003 Document Revised: 10/30/2015 Document  Reviewed: 11/07/2013 Elsevier Interactive Patient Education  2017 [place] Introduction Surgical drains are used to remove extra fluid that normally builds up in a surgical wound after surgery. A surgical drain helps to heal a surgical wound. Different kinds of surgical drains include:  Active drains. These drains use suction to pull drainage away  from the surgical wound. Drainage flows through a tube to a container outside of the body. It is important to keep the bulb or the drainage container flat (compressed) at all times, except while you empty it. Flattening the bulb or container creates suction. The two most common types of active drains are bulb drains and Hemovac drains.  Passive drains. These drains allow fluid to drain naturally, by gravity. Drainage flows through a tube to a bandage (dressing) or a container outside of the body. Passive drains do not need to be emptied. The most common type of passive drain is the Penrose drain. A drain is placed during surgery. Immediately after surgery, drainage is usually bright red and a little thicker than water. The drainage may gradually turn yellow or pink and become thinner. It is likely that your health care provider will remove the drain when the drainage stops or when the amount decreases to 1-2 Tbsp (15-30 mL) during a 24-hour period. How to care for your surgical drain  Keep the skin around the drain dry and covered with a dressing at all times.  Check your drain area every day for signs of infection. Check for:  More redness, swelling, or pain.  Pus or a bad smell.  Cloudy drainage. Follow instructions from your health care provider about how to take care of your drain and how to change your dressing. Change your dressing at least one time every day. Change it more often if needed to keep the dressing dry. Make sure you: 1. Gather your supplies, including:  Tape.  Germ-free cleaning solution (sterile saline).  Split gauze drain sponge: 4 x 4 inches (10 x 10 cm).  Gauze square: 4 x 4 inches (10 x 10 cm). 2. Wash your hands with soap and water before you change your dressing. If soap and water are not available, use hand sanitizer. 3. Remove the old dressing. Avoid using scissors to do that. 4. Use sterile saline to clean your skin around the drain. 5. Place the tube through  the slit in a drain sponge. Place the drain sponge so that it covers your wound. 6. Place the gauze square or another drain sponge on top of the drain sponge that is on the wound. Make sure the tube is between those layers. 7. Tape the dressing to your skin. 8. If you have an active bulb or Hemovac drain, tape the drainage tube to your skin 1-2 inches (2.5-5 cm) below the place where the tube enters your body. Taping keeps the tube from pulling on any stitches (sutures) that you have. 9. Wash your hands with soap and water. 10. Write down the color of your drainage and how often you change your dressing. How to empty your active bulb or Hemovac drain 1. Make sure that you have a measuring cup that you can empty your drainage into. 2. Wash your hands with soap and water. If soap and water are not available, use hand sanitizer. 3. Gently move your fingers down the tube while squeezing very lightly. This is called stripping the tube. This clears any drainage, clots, or tissue from the tube.  Do not  pull on the tube.  You may need to strip the tube several times every day to keep the tube clear. 4. Open the bulb cap or the drain plug. Do not touch the inside of the cap or the bottom of the plug. 5. Empty all of the drainage into the measuring cup. 6. Compress the bulb or the container and replace the cap or the plug. To compress the bulb or the container, squeeze it firmly in the middle while you close the cap or plug the container. 7. Write down the amount of drainage that you have in each 24-hour period. If you have less than 2 Tbsp (30 mL) of drainage during 24 hours, contact your health care provider. 8. Flush the drainage down the toilet. 9. Wash your hands with soap and water. Contact a health care provider if:  You have more redness, swelling, or pain around your drain area.  The amount of drainage that you have is increasing instead of decreasing.  You have pus or a bad smell coming  from your drain area.  You have a fever.  You have drainage that is cloudy.  There is a sudden stop or a sudden decrease in the amount of drainage that you have.  Your tube falls out.  Your active draindoes not stay compressedafter you empty it. This information is not intended to replace advice given to you by your health care provider. Make sure you discuss any questions you have with your health care provider. Document Released: 02/20/2000 Document Revised: 07/31/2015 Document Reviewed: 09/11/2014  2017 Elsevier

## 2016-04-16 NOTE — Discharge Summary (Signed)
Physician Discharge Summary  Patient ID: Margaret Mcdaniel MRN: [id number] DOB/AGE: Jan 11, 1951 66 y.o.  Admit date: 04/14/2016 Discharge date: 04/16/2016  Admission Diagnoses:Left breast carcinoma with metastatic disease to axillary nodes, schizophrenia, diabetes mellitus  Discharge Diagnoses: Same Active Problems:   Breast cancer metastasized to axillary lymph node, left [facility])   Discharged Condition: good  Hospital Course: Patient is a 66 year old white female with a history of schizophrenia and mental deficiency who was found on breast biopsy to have a left breast carcinoma with metastatic disease to the axilla. She underwent a left modified radical mastectomy on 04/14/2016. She tolerated the procedure well. Her postoperative course has been unremarkable. Her diet was advanced without difficulty. Her blood sugars have been running high, but this is apparently a chronic problem for her. The diabetes coordinator was consulted and has made recommendations for outpatient care. Patient will be followed by home health for drain care, wound inspection, and diabetes care. Final pathology is still pending.  Treatments: surgery: Left modified radical mastectomy on 04/14/2016  Discharge Exam: Blood pressure 102/73, pulse 62, temperature 98.6 F (37 C), temperature source Axillary, resp. rate 14, height 5\' 4"  (1.626 m), weight 93.1 kg (205 lb 4 oz), SpO2 97 %. General appearance: alert, cooperative and no distress Resp: clear to auscultation bilaterally Breasts: Left breast incision healing well without hematoma. JP drainage serosanguineous in nature. Cardio: regular rate and rhythm, S1, S2 normal, no murmur, click, rub or gallop  Disposition: 01-Home or Self Care  Discharge Instructions    Diet Carb Modified    Complete by:  As directed    Increase activity slowly    Complete by:  As directed      Allergies as of 04/16/2016      Reactions   Keflex [cephalexin]    Pt "codes" on this medication.    Sulfa Antibiotics Hives   Latex Rash      Medication List    TAKE these medications   allopurinol 100 MG tablet Commonly known as:  ZYLOPRIM Take 100 mg by mouth daily.   aspirin EC 81 MG tablet Take 81 mg by mouth daily.   chlorproMAZINE 50 MG tablet Commonly known as:  THORAZINE Take 50 mg by mouth 3 (three) times daily as needed.   HYDROcodone-acetaminophen 5-325 MG tablet Commonly known as:  NORCO Take 1 tablet by mouth every 6 (six) hours as needed for moderate pain.   insulin aspart 100 UNIT/ML injection Commonly known as:  novoLOG Inject 20 Units into the skin 3 (three) times daily before meals.   insulin detemir 100 UNIT/ML injection Commonly known as:  LEVEMIR Inject 75 Units into the skin 2 (two) times daily.   INVOKANA PO Take 100 mg by mouth daily.   levothyroxine 50 MCG tablet Commonly known as:  SYNTHROID, LEVOTHROID Take 50 mcg by mouth daily before breakfast.   mirtazapine 15 MG tablet Commonly known as:  REMERON Take 15 mg by mouth at bedtime.   niacin 500 MG tablet Take 1,000 mg by mouth at bedtime.   omeprazole 40 MG capsule Commonly known as:  PRILOSEC Take 40 mg by mouth daily.   RESTORIL 15 MG capsule Generic drug:  temazepam Take 15 mg by mouth at bedtime.   simvastatin 40 MG tablet Commonly known as:  ZOCOR Take 40 mg by mouth at bedtime.   tiZANidine 4 MG tablet Commonly known as:  ZANAFLEX Take 8 mg by mouth 2 (two) times daily.   VICTOZA 18 MG/3ML Sopn Generic drug:  liraglutide Inject 1.8 mg into the skin daily.      Follow-up Information    [facility] Follow up.   Contact information: [place] [id number] [phone]        Jamesetta So, MD. Schedule an appointment as soon as possible for a visit on 04/27/2016.   Specialty:  General Surgery Contact information: [address] [id number] [phone]           Signed: Aviva Signs  A 04/16/2016, 11:01 AM

## 2016-04-16 NOTE — Progress Notes (Signed)
1430 d/c instructions, hard Rx and paperwork given to patient, patient's son and patient's daughter-in-law. IV catheter removed from RIGHT hand, intact w/no s/s of infection/infiltration noted, patient tolerated well. Patient removed from monitor wires and assisted with getting dressed. Family at bedside to transport patient home.

## 2016-04-16 NOTE — Care Management Important Message (Signed)
Important Message  Patient Details  Name: Margaret Mcdaniel MRN: [id number] Date of Birth: Aug 20, 1950   Medicare Important Message Given:  Yes    Sherald Barge, RN 04/16/2016, 10:39 AM

## 2016-04-16 NOTE — Progress Notes (Signed)
1010 surgical site to LEFT chest S/P LEFT masectomy assessed after Dr.Jenkins removed the initial surgical drsg. Surgical site clean, dry, 40 staples noted intact, bruising noted. Site covered with 2 ABD pads and binder as ordered per Dr.Jenkins. Patient given instructions on how to empty JP drain multiple times and was able to demonstrate emptying and recharging the JP bulb. JP drain noted intact and attached to skin with stitches at this time.

## 2016-04-17 DIAGNOSIS — F209 Schizophrenia, unspecified: Secondary | ICD-10-CM | POA: Diagnosis not present

## 2016-04-17 DIAGNOSIS — Z794 Long term (current) use of insulin: Secondary | ICD-10-CM | POA: Diagnosis not present

## 2016-04-17 DIAGNOSIS — Z7982 Long term (current) use of aspirin: Secondary | ICD-10-CM | POA: Diagnosis not present

## 2016-04-17 DIAGNOSIS — C50912 Malignant neoplasm of unspecified site of left female breast: Secondary | ICD-10-CM | POA: Diagnosis not present

## 2016-04-17 DIAGNOSIS — E119 Type 2 diabetes mellitus without complications: Secondary | ICD-10-CM | POA: Diagnosis not present

## 2016-04-17 DIAGNOSIS — Z9981 Dependence on supplemental oxygen: Secondary | ICD-10-CM | POA: Diagnosis not present

## 2016-04-17 DIAGNOSIS — C7989 Secondary malignant neoplasm of other specified sites: Secondary | ICD-10-CM | POA: Diagnosis not present

## 2016-04-17 DIAGNOSIS — Z483 Aftercare following surgery for neoplasm: Secondary | ICD-10-CM | POA: Diagnosis not present

## 2016-04-17 DIAGNOSIS — I10 Essential (primary) hypertension: Secondary | ICD-10-CM | POA: Diagnosis not present

## 2016-04-28 DIAGNOSIS — Z794 Long term (current) use of insulin: Secondary | ICD-10-CM | POA: Diagnosis not present

## 2016-04-28 DIAGNOSIS — Z9981 Dependence on supplemental oxygen: Secondary | ICD-10-CM | POA: Diagnosis not present

## 2016-04-28 DIAGNOSIS — C50912 Malignant neoplasm of unspecified site of left female breast: Secondary | ICD-10-CM | POA: Diagnosis not present

## 2016-04-28 DIAGNOSIS — I10 Essential (primary) hypertension: Secondary | ICD-10-CM | POA: Diagnosis not present

## 2016-04-28 DIAGNOSIS — F209 Schizophrenia, unspecified: Secondary | ICD-10-CM | POA: Diagnosis not present

## 2016-04-28 DIAGNOSIS — Z483 Aftercare following surgery for neoplasm: Secondary | ICD-10-CM | POA: Diagnosis not present

## 2016-04-28 DIAGNOSIS — C7989 Secondary malignant neoplasm of other specified sites: Secondary | ICD-10-CM | POA: Diagnosis not present

## 2016-04-28 DIAGNOSIS — Z7982 Long term (current) use of aspirin: Secondary | ICD-10-CM | POA: Diagnosis not present

## 2016-04-28 DIAGNOSIS — E119 Type 2 diabetes mellitus without complications: Secondary | ICD-10-CM | POA: Diagnosis not present

## 2016-04-29 DIAGNOSIS — Z9981 Dependence on supplemental oxygen: Secondary | ICD-10-CM | POA: Diagnosis not present

## 2016-04-29 DIAGNOSIS — C50912 Malignant neoplasm of unspecified site of left female breast: Secondary | ICD-10-CM | POA: Diagnosis not present

## 2016-04-29 DIAGNOSIS — Z7982 Long term (current) use of aspirin: Secondary | ICD-10-CM | POA: Diagnosis not present

## 2016-04-29 DIAGNOSIS — F209 Schizophrenia, unspecified: Secondary | ICD-10-CM | POA: Diagnosis not present

## 2016-04-29 DIAGNOSIS — Z794 Long term (current) use of insulin: Secondary | ICD-10-CM | POA: Diagnosis not present

## 2016-04-29 DIAGNOSIS — E119 Type 2 diabetes mellitus without complications: Secondary | ICD-10-CM | POA: Diagnosis not present

## 2016-04-29 DIAGNOSIS — Z483 Aftercare following surgery for neoplasm: Secondary | ICD-10-CM | POA: Diagnosis not present

## 2016-04-29 DIAGNOSIS — I10 Essential (primary) hypertension: Secondary | ICD-10-CM | POA: Diagnosis not present

## 2016-04-29 DIAGNOSIS — C7989 Secondary malignant neoplasm of other specified sites: Secondary | ICD-10-CM | POA: Diagnosis not present

## 2016-05-07 ENCOUNTER — Encounter (HOSPITAL_COMMUNITY): Payer: Self-pay

## 2016-05-07 ENCOUNTER — Encounter (HOSPITAL_COMMUNITY): Payer: PPO | Attending: Oncology | Admitting: Oncology

## 2016-05-07 VITALS — BP 138/83 | HR 116 | Temp 99.1°F | Resp 18 | Ht 64.0 in | Wt 196.6 lb

## 2016-05-07 DIAGNOSIS — Z9889 Other specified postprocedural states: Secondary | ICD-10-CM | POA: Insufficient documentation

## 2016-05-07 DIAGNOSIS — Z87891 Personal history of nicotine dependence: Secondary | ICD-10-CM | POA: Insufficient documentation

## 2016-05-07 DIAGNOSIS — Z888 Allergy status to other drugs, medicaments and biological substances status: Secondary | ICD-10-CM | POA: Insufficient documentation

## 2016-05-07 DIAGNOSIS — C773 Secondary and unspecified malignant neoplasm of axilla and upper limb lymph nodes: Secondary | ICD-10-CM

## 2016-05-07 DIAGNOSIS — J449 Chronic obstructive pulmonary disease, unspecified: Secondary | ICD-10-CM | POA: Insufficient documentation

## 2016-05-07 DIAGNOSIS — F209 Schizophrenia, unspecified: Secondary | ICD-10-CM | POA: Diagnosis not present

## 2016-05-07 DIAGNOSIS — C50912 Malignant neoplasm of unspecified site of left female breast: Secondary | ICD-10-CM

## 2016-05-07 DIAGNOSIS — Z9049 Acquired absence of other specified parts of digestive tract: Secondary | ICD-10-CM | POA: Insufficient documentation

## 2016-05-07 DIAGNOSIS — Z170 Estrogen receptor positive status [ER+]: Secondary | ICD-10-CM | POA: Insufficient documentation

## 2016-05-07 DIAGNOSIS — G4730 Sleep apnea, unspecified: Secondary | ICD-10-CM | POA: Insufficient documentation

## 2016-05-07 DIAGNOSIS — Z79899 Other long term (current) drug therapy: Secondary | ICD-10-CM | POA: Insufficient documentation

## 2016-05-07 DIAGNOSIS — E119 Type 2 diabetes mellitus without complications: Secondary | ICD-10-CM | POA: Insufficient documentation

## 2016-05-07 DIAGNOSIS — Z794 Long term (current) use of insulin: Secondary | ICD-10-CM | POA: Insufficient documentation

## 2016-05-07 DIAGNOSIS — E039 Hypothyroidism, unspecified: Secondary | ICD-10-CM | POA: Insufficient documentation

## 2016-05-07 DIAGNOSIS — Z9012 Acquired absence of left breast and nipple: Secondary | ICD-10-CM | POA: Insufficient documentation

## 2016-05-07 DIAGNOSIS — F419 Anxiety disorder, unspecified: Secondary | ICD-10-CM | POA: Insufficient documentation

## 2016-05-07 NOTE — Progress Notes (Signed)
[facility]  CONSULT NOTE  Patient Care Team: Pcp Not In System as PCP - General  CHIEF COMPLAINTS/PURPOSE OF CONSULTATION:  Left breast invasive ductal carcinoma grade 2, ER ER 100% / PR 100% / HER-2 negative / Ki-67 40% Schizophrenia    Breast cancer metastasized to axillary lymph node, left ([place])   04/14/2016 Initial Diagnosis    Breast cancer metastasized to axillary lymph node, left ([place])     04/14/2016 Surgery    Left modified radical mastectomy - INVASIVE GRADE 2 DUCTAL CARCINOMA, SPANNING 2.2 CM IN GREATEST DIMENSION. - ASSOCIATED INTERMEDIATE GRADE DUCTAL CARCINOMA IN SITU. - ANTERIOR MARGIN IS BROADLY POSITIVE FOR INVASIVE DUCTAL CARCINOMA IN AREA OF TUMOR (UPPER INNER QUADRANT). - OTHER MARGINS ARE NEGATIVE. - ONE OF SIX LYMPH NODES POSITIVE FOR METASTATIC DUCTAL CARCINOMA (1/6). - ER/PR positive, HER2 negative. - pT2 pN1a cM0       HISTORY OF PRESENTING ILLNESS:  Margaret Mcdaniel 67 y.o. female who originally presented with a palpable lump in the left breast. Accordingly she underwent a bilateral diagnostic mammogram and left breast ultrasound on 03/09/2016. These scans showed a highly suspicious mass in the left breast at 10 o'clock, 10 cm from the nipple. There was also an abnormal left axillary lymph node noted. Biopsy of the upper inner quadrant of the left breast on 03/30/2016 revealed infiltrative ductal carcinoma, grade 2, ER 100% / PR 100% / HER-2 negative / Ki-67 40%. Lymph node in the left axilla inferior was also biopsied and revealed metastatic ductal carcinoma.   The patient underwent a modified radical left breast mastectomy with axillary content on 04/14/2016 with Dr. Arnoldo Morale. Pathology showed invasive grade 2 ductal carcinoma, spanning 2.2 cm in greatest dimension, ER 100% / PR 100% / HER-2 negative / Ki-67 40%. There was associated intermediate grade DCIS. The anterior margins was broadly positive for invasive ductal carcinoma in area of tumor  (upper inner quadrant), and other margins are negative. One of six lymph nodes were positive for metastatic ductal carcinoma.    The patient is accompanied by her son and daughter-in-law today. She is doing well after surgery and denies any pain. The patient has not met with radiation oncology. Patient states she has a high WBC. She lives by herself and her son reports she stays in bed most of the time. She is not very active. The patient reports she stays in bed because she feels tired. Her schizophrenia is currently well-controlled with medication. Her family makes her health decisions for her. Her appetite is good and she is eating well.    MEDICAL HISTORY:  Past Medical History:  Diagnosis Date  . Anxiety   . Arthritis   . Cancer ([place])   . COPD (chronic obstructive pulmonary disease) ([place])   . Diabetes mellitus without complication ([place])   . Hypothyroidism   . Mental disorder   . Neuromuscular disorder ([place])   . Sleep apnea     SURGICAL HISTORY: Past Surgical History:  Procedure Laterality Date  . ANKLE SURGERY    . APPENDECTOMY    . CHOLECYSTECTOMY    . EXPLORATORY LAPAROTOMY    . HEMORRHOID SURGERY    . MASTECTOMY MODIFIED RADICAL Left 04/14/2016   Procedure: LEFT MODIFIED RADICAL MASTECTOMY;  Surgeon: Aviva Signs, MD;  Location: [facility];  Service: General;  Laterality: Left;  . ROTATOR CUFF REPAIR    . TONSILLECTOMY    . TUBAL LIGATION      SOCIAL HISTORY: Social History  Social History  . Marital status: Divorced    Spouse name: N/A  . Number of children: N/A  . Years of education: N/A   Occupational History  . Not on file.   Social History Main Topics  . Smoking status: Former Smoker    Types: Cigarettes    Quit date: 04/12/2014  . Smokeless tobacco: Never Used  . Alcohol use No  . Drug use: No  . Sexual activity: Not on file   Other Topics Concern  . Not on file   Social History Narrative  . No narrative on file    FAMILY HISTORY: History  reviewed. No pertinent family history.  ALLERGIES:  is allergic to keflex [cephalexin]; sulfa antibiotics; and latex.  MEDICATIONS:  Current Outpatient Prescriptions  Medication Sig Dispense Refill  . allopurinol (ZYLOPRIM) 100 MG tablet Take 100 mg by mouth daily.    Marland Kitchen aspirin EC 81 MG tablet Take 81 mg by mouth daily.    . Canagliflozin (INVOKANA PO) Take 100 mg by mouth daily.    . chlorproMAZINE (THORAZINE) 50 MG tablet Take 50 mg by mouth at bedtime.     Marland Kitchen HYDROcodone-acetaminophen (NORCO) 5-325 MG tablet Take 1 tablet by mouth every 6 (six) hours as needed for moderate pain. 30 tablet 0  . insulin aspart (NOVOLOG) 100 UNIT/ML injection Inject 20 Units into the skin 3 (three) times daily before meals.    . insulin detemir (LEVEMIR) 100 UNIT/ML injection Inject 24 Units into the skin 2 (two) times daily.     Marland Kitchen levothyroxine (SYNTHROID, LEVOTHROID) 50 MCG tablet Take 50 mcg by mouth daily before breakfast.    . mirtazapine (REMERON) 15 MG tablet Take 15 mg by mouth at bedtime.    . niacin 500 MG tablet Take 1,000 mg by mouth at bedtime.    Marland Kitchen omeprazole (PRILOSEC) 40 MG capsule Take 40 mg by mouth daily.    . simvastatin (ZOCOR) 40 MG tablet Take 40 mg by mouth at bedtime.    . temazepam (RESTORIL) 15 MG capsule Take 15 mg by mouth at bedtime.    Marland Kitchen tiZANidine (ZANAFLEX) 4 MG tablet Take 8 mg by mouth 2 (two) times daily.     No current facility-administered medications for this visit.     Review of Systems  Constitutional: Positive for malaise/fatigue.  HENT: Negative.   Eyes: Negative.   Respiratory: Negative.   Cardiovascular: Negative.   Gastrointestinal: Negative.   Genitourinary: Negative.   Musculoskeletal: Negative.   Skin: Negative.   Neurological: Negative.   Endo/Heme/Allergies: Negative.   Psychiatric/Behavioral: Negative.   All other systems reviewed and are negative.  14 point ROS was done and is otherwise as detailed above or in HPI   PHYSICAL  EXAMINATION: ECOG PERFORMANCE STATUS: 2 - Symptomatic, <50% confined to bed  Vitals:   05/07/16 1323  BP: 138/83  Pulse: (!) 116  Resp: 18  Temp: 99.1 F (37.3 C)   Filed Weights   05/07/16 1323  Weight: 196 lb 9.6 oz (89.2 kg)     Physical Exam  Constitutional: She is oriented to person, place, and time and well-developed, well-nourished, and in no distress. No distress.  HENT:  Head: Normocephalic and atraumatic.  Mouth/Throat: Oropharynx is clear and moist. No oropharyngeal exudate.  Eyes: Conjunctivae and EOM are normal. Pupils are equal, round, and reactive to light. No scleral icterus.  Neck: Normal range of motion. Neck supple. No JVD present.  Cardiovascular: Normal rate, regular rhythm and normal heart sounds.  Exam reveals no gallop and no friction rub.   No murmur heard. Pulmonary/Chest: Effort normal and breath sounds normal. No respiratory distress. She has no wheezes. She has no rales. Right breast exhibits no inverted nipple, no mass, no nipple discharge, no skin change and no tenderness.    Abdominal: Soft. Bowel sounds are normal. She exhibits no distension. There is no tenderness. There is no guarding.  Musculoskeletal: Normal range of motion. She exhibits no edema or tenderness.  Lymphadenopathy:    She has no cervical adenopathy.  Neurological: She is alert and oriented to person, place, and time. No cranial nerve deficit. Gait normal.  Skin: Skin is warm and dry. No rash noted. No erythema. No pallor.  Psychiatric: Affect and judgment normal.  Nursing note and vitals reviewed.   LABORATORY DATA:  I have reviewed the data as listed Lab Results  Component Value Date   WBC 10.2 04/16/2016   HGB 11.0 (L) 04/16/2016   HCT 33.7 (L) 04/16/2016   MCV 88.5 04/16/2016   PLT 189 04/16/2016   CMP     Component Value Date/Time   NA 140 04/16/2016 0440   K 3.9 04/16/2016 0440   CL 106 04/16/2016 0440   CO2 26 04/16/2016 0440   GLUCOSE 183 (H) 04/16/2016  0440   BUN 19 04/16/2016 0440   CREATININE 1.15 (H) 04/16/2016 0440   CALCIUM 8.7 (L) 04/16/2016 0440   PROT 7.4 04/12/2016 1439   ALBUMIN 4.2 04/12/2016 1439   AST 43 (H) 04/12/2016 1439   ALT 35 04/12/2016 1439   ALKPHOS 64 04/12/2016 1439   BILITOT 0.4 04/12/2016 1439   GFRNONAA 48 (L) 04/16/2016 0440   GFRAA 56 (L) 04/16/2016 0440   PATHOLOGY  Diagnosis 04/14/2016 Breast, modified radical mastectomy , left with axillary content - INVASIVE GRADE 2 DUCTAL CARCINOMA, SPANNING 2.2 CM IN GREATEST DIMENSION. - ASSOCIATED INTERMEDIATE GRADE DUCTAL CARCINOMA IN SITU. - ANTERIOR MARGIN IS BROADLY POSITIVE FOR INVASIVE DUCTAL CARCINOMA IN AREA OF TUMOR (UPPER INNER QUADRANT). - OTHER MARGINS ARE NEGATIVE. - ONE OF SIX LYMPH NODES POSITIVE FOR METASTATIC DUCTAL CARCINOMA (1/6). - SEE ONCOLOGY TEMPLATE. Procedure: Left modified radical mastectomy. Laterality: Left. Tumor Size: 2.2 cm. Histologic Type: Invasive ductal carcinoma. Grade: 2. Tubular Differentiation: 3. Nuclear Pleomorphism: 2. Mitotic Count: 2. Ductal Carcinoma in Situ (DCIS): Yes, intermediate grade ductal carcinoma in situ is present. Extent of Tumor: Tumor confined to breast parenchyma. Skin: Not involved. Nipple: Not involved. Skeletal muscle: Not received. Margins: Invasive carcinoma, distance from closest margin: Invasive ductal carcinoma broadly involves the anterior margin. DCIS, distance from closest margin: At least 0.4 cm (all margins). Regional Lymph Nodes: Number of Lymph Nodes Examined: 6. Number of Sentinel Lymph Nodes Examined: 0. Lymph Nodes with Macrometastases: 1. Lymph Nodes with Micrometastases: 0. Lymph Nodes with Isolated Tumor Cells: 0. Breast Prognostic Profile: Performed on previous case [id number]: Estrogen Receptor: 100%, positive. Progesterone Receptor: 100%, positive. Her2: 1.34 ratio, negative. Ki-67: 40%. Pathologic Stage Classification (pTNM, AJCC 8th Edition): Primary  Tumor (pT): pT2. Regional Lymph Nodes (pN): pN1a. Distant Metastases (pM): MX. Comments: Of note, there is a focal area identified suspicious for extracapsular extension of tumor on the section which demonstrates the positive lymph node. In addition to the above findings, fibrocystic changes with florid usual ductal hyperplasia and apocrine metaplasia are identified.  Diagnosis 03/30/2016 1. Breast, left, needle core biopsy, upper inner quadrant 10:00 INFILTRATIVE DUCTAL CARCINOMA, GRADE 2 2. Lymph node, needle/core biopsy, left axilla inferior METASTATIC DUCTAL CARCINOMA Microscopic Comment  1. The neoplasm stains positive for E-cadherin, supporting the above diagnosis. 1. PROGNOSTIC INDICATORS Results: IMMUNOHISTOCHEMICAL AND MORPHOMETRIC ANALYSIS PERFORMED MANUALLY Estrogen Receptor: 100%, POSITIVE, STRONG STAINING INTENSITY Progesterone Receptor: 100%, POSITIVE, STRONG STAINING INTENSITY Proliferation Marker Ki67: 40% 1. FLUORESCENCE IN-SITU HYBRIDIZATION Results: HER2 - NEGATIVE RATIO OF HER2/CEP17 SIGNALS 1.34 AVERAGE HER2 COPY NUMBER PER CELL 2.15   RADIOGRAPHIC STUDIES: I have personally reviewed the radiological images as listed and agreed with the findings in the report.  Diagnostic bilateral mammogram / Ultrasound left breast 03/09/2016 IMPRESSION: Highly suspicious mass in the left breast at 10 o'clock, 10 cm from the nipple. Abnormal left axillary lymph node.  ASSESSMENT & PLAN:  Cancer Staging Breast cancer metastasized to axillary lymph node, left ([facility]) Staging form: Breast, AJCC 8th Edition - Clinical: Stage IIA (cT2, cN1, cM0, G2, ER: Positive, PR: Positive, HER2: Negative) - Signed by Twana First, MD on 05/07/2016  Pathology reviewed with the patient and family. She will need adjuvant chemotherapy and endocrine therapy. I have discussed different options for chemotherapy. Given consideration of patient's comorbidities, I believe 4 cycles of adjuvant TC would be  the best option for her. After she completes 4 cycles of TC, I will refer patient to rad-onc for adjuvant radiation oncology, she did have a positive anterior margin. Following radiation, I will plan to start patient on endocrine therapy with an AI.  The patient will be referred to Dr. Arnoldo Morale for port placement.  I have ordered CT chest/abd/pelvis with contrast for complete staging.   Per the patient's family, her schizophrenia is currently well-controlled with medication.   RTC in 1 week to discuss staging CT results and finalize treatment decisions moving forward.   ORDERS PLACED FOR THIS ENCOUNTER: No orders of the defined types were placed in this encounter.   MEDICATIONS PRESCRIBED THIS ENCOUNTER: No orders of the defined types were placed in this encounter.    All questions were answered. The patient knows to call the clinic with any problems, questions or concerns.  I spent 30 minutes counseling the patient face to face. The total time spent in the appointment was 45 minutes and more than 50% was on counseling and review of test results  This document serves as a record of services personally performed by Twana First, MD. It was created on her behalf by Arlyce Harman, a trained medical scribe. The creation of this record is based on the scribe's personal observations and the provider's statements to them. This document has been checked and approved by the attending provider.  I have reviewed the above documentation for accuracy and completeness and I agree with the above.  This note was electronically signed.    Twana First, MD 05/07/2016 1:38 PM

## 2016-05-07 NOTE — Patient Instructions (Signed)
[place] at [facility] Discharge Instructions  RECOMMENDATIONS MADE BY THE CONSULTANT AND ANY TEST RESULTS WILL BE SENT TO YOUR REFERRING PHYSICIAN.  You were seen today by Dr. Twana First We will get you scheduled with Dr. Arnoldo Morale for port-a-cath placement CT scan of chest/abdomen/pelvis will be done next week Follow up next week with lab work See Amy up front for appointments   Thank you for choosing [place] at [facility] to provide your oncology and hematology care.  To afford each patient quality time with our provider, please arrive at least 15 minutes before your scheduled appointment time.    If you have a lab appointment with the [facility] please come in thru the  Main Entrance and check in at the main information desk  You need to re-schedule your appointment should you arrive 10 or more minutes late.  We strive to give you quality time with our providers, and arriving late affects you and other patients whose appointments are after yours.  Also, if you no show three or more times for appointments you may be dismissed from the clinic at the providers discretion.     Again, thank you for choosing [facility].  Our hope is that these requests will decrease the amount of time that you wait before being seen by our physicians.       _____________________________________________________________  Should you have questions after your visit to [facility], please contact our office at [phone] between the hours of 8:30 a.m. and 4:30 p.m.  Voicemails left after 4:30 p.m. will not be returned until the following business day.  For prescription refill requests, have your pharmacy contact our office.       Resources For Cancer Patients and their Caregivers ? American Cancer Society: Can assist with transportation, wigs, general needs, runs Look Good Feel Better.        (331)674-3252 ? Cancer  Care: Provides financial assistance, online support groups, medication/co-pay assistance.  1-800-813-HOPE (423) 176-0095) ? [facility] Assists [place] cancer patients and their families through emotional , educational and financial support.  320-389-0753 ? [place] DSS Where to apply for food stamps, Medicaid and utility assistance. (715)019-7302 ? [employer]: Transportation to medical appointments. 218-129-8445 ? Social Security Administration: May apply for disability if have a Stage IV cancer. (781)354-8421 (217)511-9150 ? [employer], Disability and Transit Services: Assists with nutrition, care and transit needs. [place] Support Programs: @10RELATIVEDAYS @ > Cancer Support Group  2nd Tuesday of the month 1pm-2pm, Journey Room  > Creative Journey  3rd Tuesday of the month 1130am-1pm, Journey Room  > Look Good Feel Better  1st Wednesday of the month 10am-12 noon, Journey Room (Call [facility] to register [phone])

## 2016-05-14 ENCOUNTER — Ambulatory Visit (HOSPITAL_COMMUNITY)
Admission: RE | Admit: 2016-05-14 | Discharge: 2016-05-14 | Disposition: A | Discharge status: Home / Self Care | Payer: PPO | Source: Ambulatory Visit | Attending: Oncology | Admitting: Oncology

## 2016-05-14 ENCOUNTER — Other Ambulatory Visit (HOSPITAL_COMMUNITY): Payer: PPO

## 2016-05-14 DIAGNOSIS — I700 Atherosclerosis of aorta: Secondary | ICD-10-CM | POA: Insufficient documentation

## 2016-05-14 DIAGNOSIS — Z9889 Other specified postprocedural states: Secondary | ICD-10-CM | POA: Diagnosis not present

## 2016-05-14 DIAGNOSIS — Z9012 Acquired absence of left breast and nipple: Secondary | ICD-10-CM | POA: Insufficient documentation

## 2016-05-14 DIAGNOSIS — C773 Secondary and unspecified malignant neoplasm of axilla and upper limb lymph nodes: Secondary | ICD-10-CM | POA: Diagnosis not present

## 2016-05-14 DIAGNOSIS — C50912 Malignant neoplasm of unspecified site of left female breast: Secondary | ICD-10-CM | POA: Diagnosis not present

## 2016-05-14 DIAGNOSIS — C50919 Malignant neoplasm of unspecified site of unspecified female breast: Secondary | ICD-10-CM | POA: Diagnosis not present

## 2016-05-14 IMAGING — CT CT CHEST W/ CM
2 of 5 series · 12 of 36 positions shown, 15 images · IV contrast (Isovue)
Comparison: CT scan of the chest [DATE].

CLINICAL DATA: Breast cancer staging status post mastectomy.
Positive axillary lymph nodes.

EXAM:
CT CHEST, ABDOMEN, AND PELVIS WITH CONTRAST
TECHNIQUE: Multidetector CT imaging of the chest, abdomen and pelvis was
performed following the standard protocol during bolus
administration of intravenous contrast.
CONTRAST:  100mL [TX] IOPAMIDOL ([TX]) INJECTION 61%

[Series 2: cap with · axial · 0.91mm/px · z∈[+916,+1406]mm · 9 of 124 slices shown, 12 images]
[im 13/124  mediastinal]
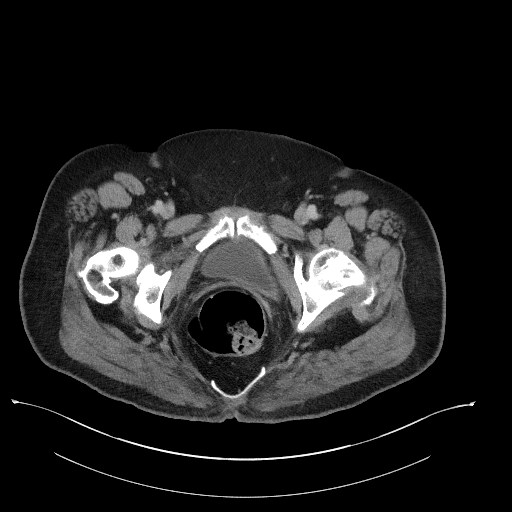
[im 13/124  lung]
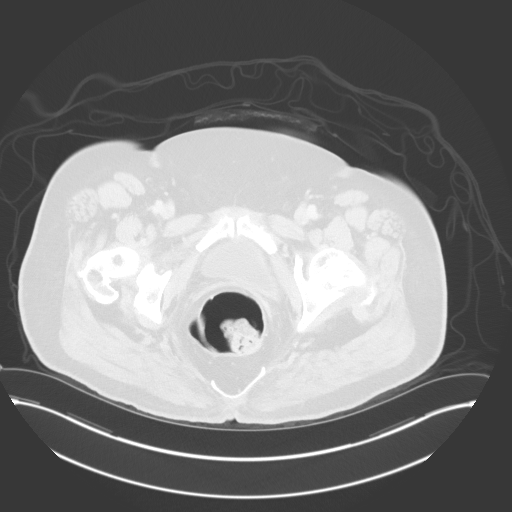
[im 25/124  lung]
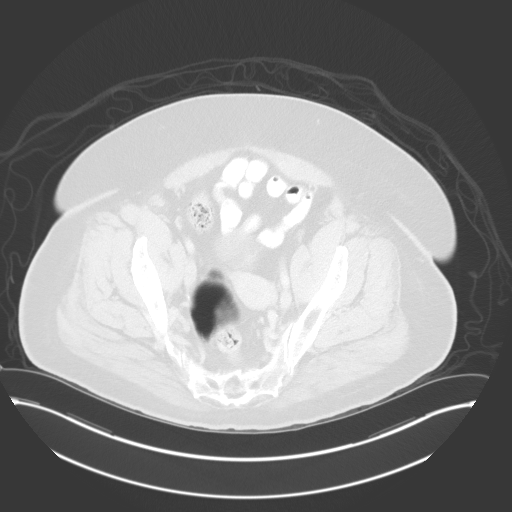
[im 37/124  lung]
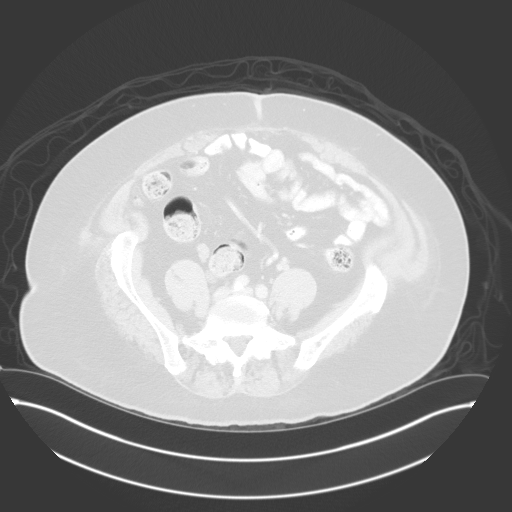
[im 50/124  lung]
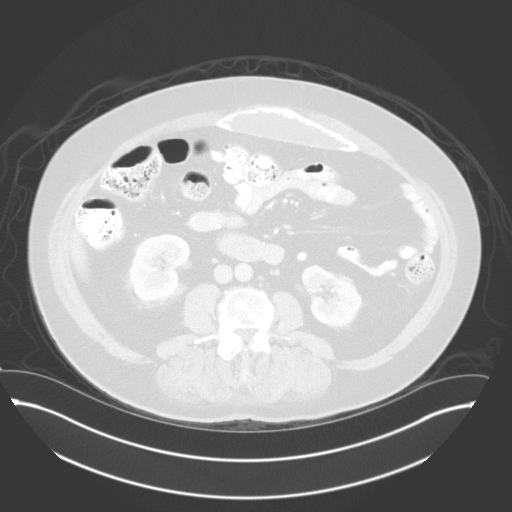
[im 62/124  mediastinal]
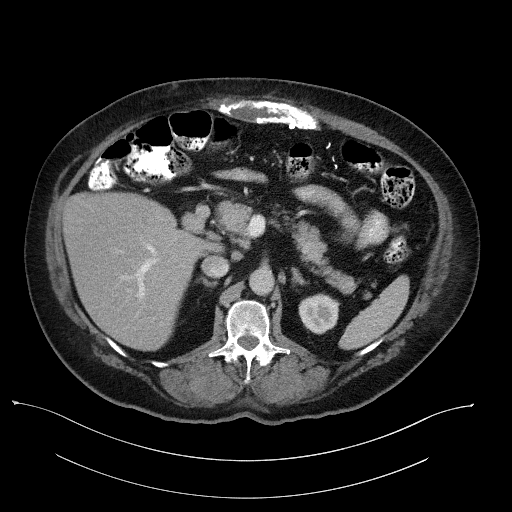
[im 62/124  lung]
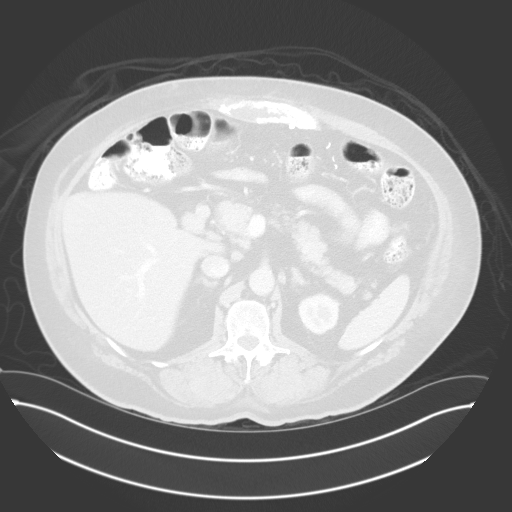
[im 74/124  lung]
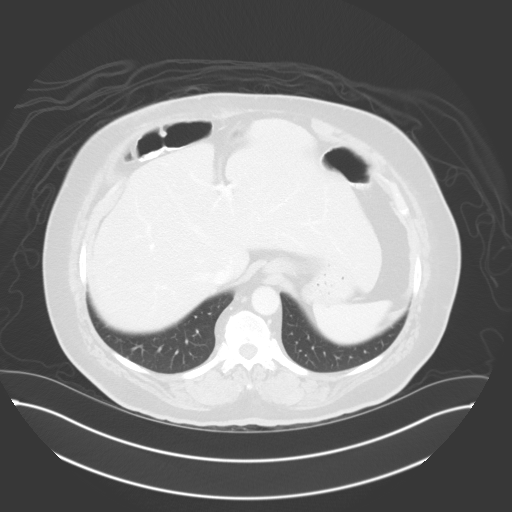
[im 87/124  lung]
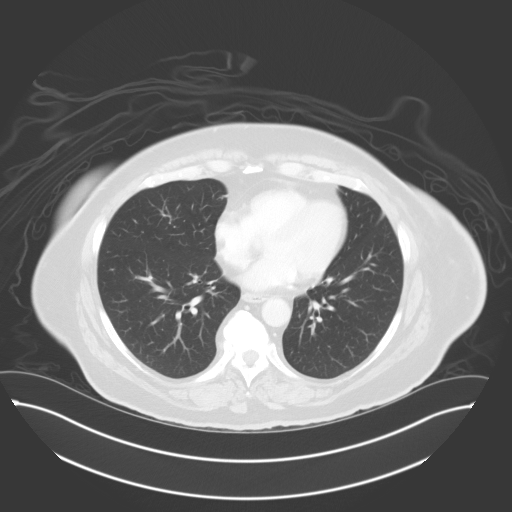
[im 99/124  lung]
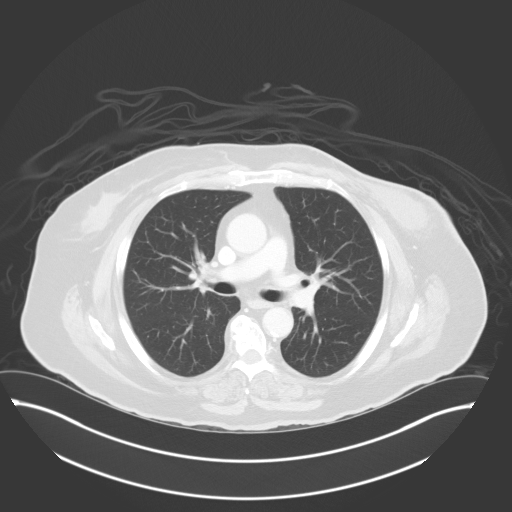
[im 111/124  mediastinal]
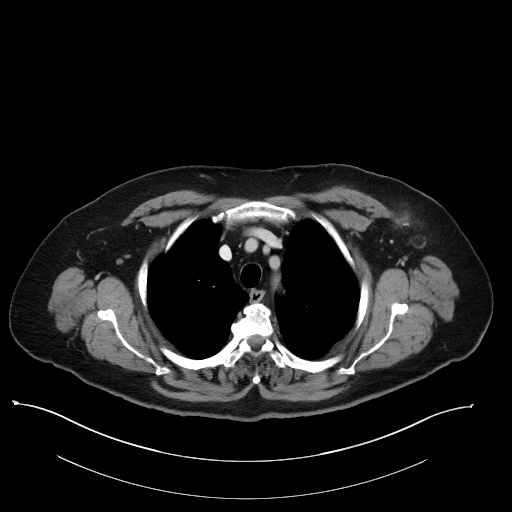
[im 111/124  lung]
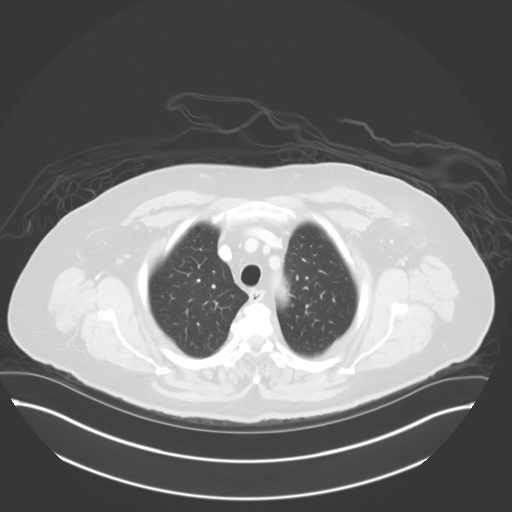

[Series 6: coronals · coronal · 0.89mm/px · 3 of 154 slices shown]
[im 31/154  lung]
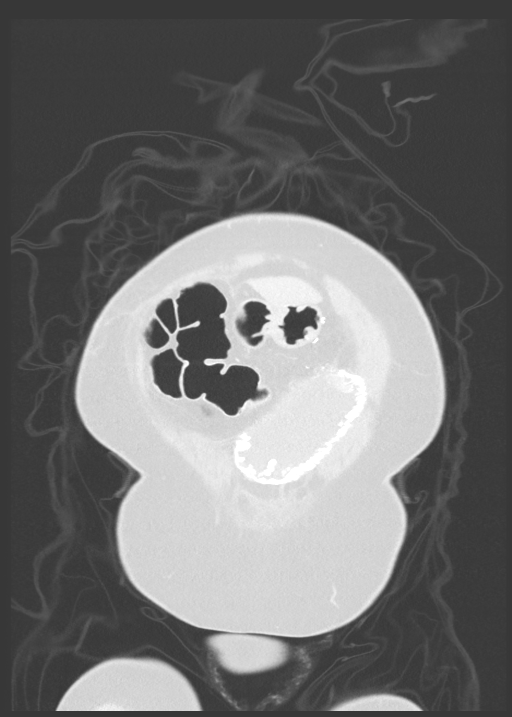
[im 62/154  lung]
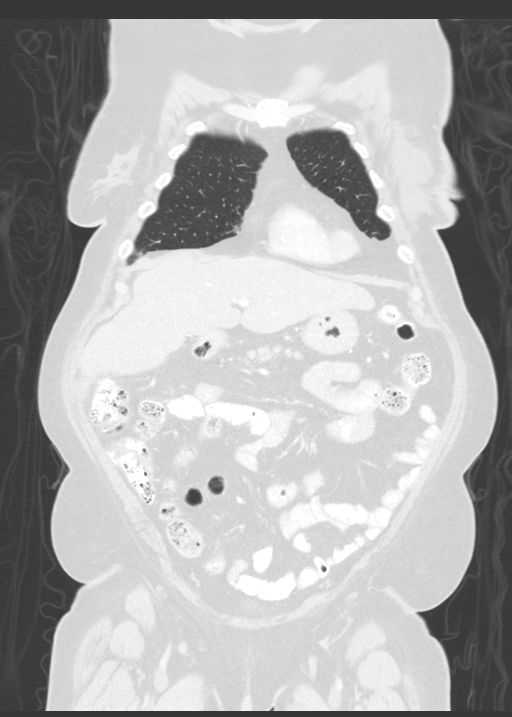
[im 92/154  lung]
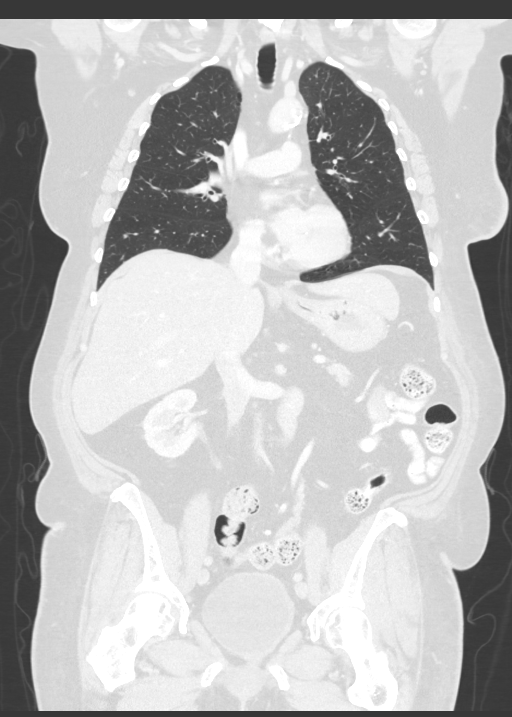

[12 of 36 positions shown; findings below may reference images not displayed]

FINDINGS: CT CHEST FINDINGS

Cardiovascular: The thoracic aorta and central pulmonary arteries
are normal in caliber. The heart is normal in size. No effusions.

Mediastinum/Nodes: The patient is status post left mastectomy with a
postoperative seroma measuring 7.1 x 3.2 cm. There is associated
skin thickening which may be postsurgical in nature. Axillary clips
are identified. A few normal lymph nodes are seen. Two rounded
regions of increased attenuation adjacent to axillary clips are low
in attenuation and most likely represent postsurgical changes rather
than mildly prominent lymph nodes. The right axillary and bilateral
retropectoral nodes are normal in appearance. The internal mammary
lymph node chains are also normal in appearance. No adenopathy seen
in the chest. The esophagus and thyroid are normal in appearance.

Lungs/Pleura: Lungs are clear. No pleural effusion or pneumothorax.

Musculoskeletal: No chest wall mass or suspicious bone lesions
identified.

CT ABDOMEN PELVIS FINDINGS

Hepatobiliary: No focal liver abnormality is seen. Status post
cholecystectomy. No biliary dilatation.

Pancreas: Unremarkable. No pancreatic ductal dilatation or
surrounding inflammatory changes.

Spleen: Normal in size without focal abnormality.

Adrenals/Urinary Tract: The right adrenal gland is normal. The left
adrenal gland is mildly thickened without a discrete mass. There may
be a tiny nodule superiorly on series 2, image 58 measuring 6 mm.
There is a nonobstructive stone in the left kidney. There is
probably a tiny cyst in the left kidney, too small to characterize.
No hydronephrosis, ureterectasis, or ureteral stones. The left side
of the bladder is mildly thick walled compared to the remainder. No
focal mass.

Stomach/Bowel: Surgical clips adjacent to the stomach are
identified. The gastric wall is mildly prominent, probably due to
poor distention. The small bowel is normal. The colon is
unremarkable. The appendix is not seen consistent previous
appendectomy.

Vascular/Lymphatic: Atherosclerotic changes are seen in the non
aneurysmal aorta. No adenopathy is identified within the abdomen.

Reproductive: There is a left adnexal cyst measuring 2.2 cm. The
uterus and right ovary are normal.

Other: The patient appears to be status post mesh hernia repair
which is calcified. There is fluid just posterior to the mesh. No
free air or other evidence of ascites.

Musculoskeletal: No acute or significant osseous findings.
IMPRESSION: 1. Left mastectomy changes with a seroma. Rounded regions of low
attenuation in the axilla adjacent to surgical clips are favored to
be postsurgical changes rather than mildly prominent lymph nodes.
2. The left adrenal gland is mildly thickened, likely due to
hyperplasia. A tiny 6 mm nodule is likely a small adenoma.
3. Atherosclerotic changes in a non aneurysmal aorta.
4. Anterior hernia mesh is calcified. Fluid just deep to the hernia
mesh is likely not acute and of doubtful acute significance.

## 2016-05-14 MED ORDER — IOPAMIDOL (ISOVUE-300) INJECTION 61%
100.0000 mL | Freq: Once | INTRAVENOUS | Status: AC | PRN
  Administered 2016-05-14: 100 mL via INTRAVENOUS

## 2016-05-17 ENCOUNTER — Encounter (HOSPITAL_COMMUNITY): Payer: PPO

## 2016-05-17 ENCOUNTER — Encounter (HOSPITAL_COMMUNITY): Payer: Self-pay | Admitting: Oncology

## 2016-05-17 ENCOUNTER — Encounter (HOSPITAL_BASED_OUTPATIENT_CLINIC_OR_DEPARTMENT_OTHER): Payer: PPO | Admitting: Oncology

## 2016-05-17 VITALS — BP 140/79 | HR 110 | Temp 98.0°F | Resp 18 | Wt 194.1 lb

## 2016-05-17 DIAGNOSIS — E039 Hypothyroidism, unspecified: Secondary | ICD-10-CM | POA: Diagnosis not present

## 2016-05-17 DIAGNOSIS — E119 Type 2 diabetes mellitus without complications: Secondary | ICD-10-CM | POA: Diagnosis not present

## 2016-05-17 DIAGNOSIS — C50912 Malignant neoplasm of unspecified site of left female breast: Secondary | ICD-10-CM

## 2016-05-17 DIAGNOSIS — G4730 Sleep apnea, unspecified: Secondary | ICD-10-CM | POA: Diagnosis not present

## 2016-05-17 DIAGNOSIS — L7634 Postprocedural seroma of skin and subcutaneous tissue following other procedure: Secondary | ICD-10-CM

## 2016-05-17 DIAGNOSIS — Z9012 Acquired absence of left breast and nipple: Secondary | ICD-10-CM | POA: Diagnosis not present

## 2016-05-17 DIAGNOSIS — Z170 Estrogen receptor positive status [ER+]: Secondary | ICD-10-CM | POA: Diagnosis not present

## 2016-05-17 DIAGNOSIS — C773 Secondary and unspecified malignant neoplasm of axilla and upper limb lymph nodes: Secondary | ICD-10-CM | POA: Diagnosis not present

## 2016-05-17 DIAGNOSIS — Z87891 Personal history of nicotine dependence: Secondary | ICD-10-CM | POA: Diagnosis not present

## 2016-05-17 DIAGNOSIS — Z9049 Acquired absence of other specified parts of digestive tract: Secondary | ICD-10-CM | POA: Diagnosis not present

## 2016-05-17 DIAGNOSIS — Z888 Allergy status to other drugs, medicaments and biological substances status: Secondary | ICD-10-CM | POA: Diagnosis not present

## 2016-05-17 DIAGNOSIS — J449 Chronic obstructive pulmonary disease, unspecified: Secondary | ICD-10-CM | POA: Diagnosis not present

## 2016-05-17 DIAGNOSIS — Z79899 Other long term (current) drug therapy: Secondary | ICD-10-CM | POA: Diagnosis not present

## 2016-05-17 DIAGNOSIS — F419 Anxiety disorder, unspecified: Secondary | ICD-10-CM | POA: Diagnosis not present

## 2016-05-17 DIAGNOSIS — Z794 Long term (current) use of insulin: Secondary | ICD-10-CM | POA: Diagnosis not present

## 2016-05-17 DIAGNOSIS — Z9889 Other specified postprocedural states: Secondary | ICD-10-CM | POA: Diagnosis not present

## 2016-05-17 LAB — COMPREHENSIVE METABOLIC PANEL
ALBUMIN: 4 g/dL (ref 3.5–5.0)
ALT: 27 U/L (ref 14–54)
AST: 33 U/L (ref 15–41)
Alkaline Phosphatase: 63 U/L (ref 38–126)
Anion gap: 11 (ref 5–15)
BILIRUBIN TOTAL: 0.3 mg/dL (ref 0.3–1.2)
BUN: 18 mg/dL (ref 6–20)
CHLORIDE: 108 mmol/L (ref 101–111)
CO2: 23 mmol/L (ref 22–32)
Calcium: 9.1 mg/dL (ref 8.9–10.3)
Creatinine, Ser: 1.15 mg/dL — ABNORMAL HIGH (ref 0.44–1.00)
GFR calc Af Amer: 56 mL/min — ABNORMAL LOW (ref >60)
GFR calc non Af Amer: 48 mL/min — ABNORMAL LOW (ref >60)
GLUCOSE: 223 mg/dL — AB (ref 65–99)
POTASSIUM: 3.6 mmol/L (ref 3.5–5.1)
Sodium: 142 mmol/L (ref 135–145)
TOTAL PROTEIN: 7.2 g/dL (ref 6.5–8.1)

## 2016-05-17 LAB — CBC WITH DIFFERENTIAL/PLATELET
Basophils Absolute: 0 10*3/uL (ref 0.0–0.1)
Basophils Relative: 0 %
Eosinophils Absolute: 0.2 10*3/uL (ref 0.0–0.7)
Eosinophils Relative: 2 %
HCT: 42 % (ref 36.0–46.0)
Hemoglobin: 13.6 g/dL (ref 12.0–15.0)
Lymphocytes Relative: 47 %
Lymphs Abs: 5.2 10*3/uL — ABNORMAL HIGH (ref 0.7–4.0)
MCH: 28.3 pg (ref 26.0–34.0)
MCHC: 32.4 g/dL (ref 30.0–36.0)
MCV: 87.3 fL (ref 78.0–100.0)
Monocytes Absolute: 1 10*3/uL (ref 0.1–1.0)
Monocytes Relative: 8 %
Neutro Abs: 4.9 10*3/uL (ref 1.7–7.7)
Neutrophils Relative %: 43 %
Platelets: 218 10*3/uL (ref 150–400)
RBC: 4.81 MIL/uL (ref 3.87–5.11)
RDW: 15.7 % — ABNORMAL HIGH (ref 11.5–15.5)
WBC: 11.3 10*3/uL — ABNORMAL HIGH (ref 4.0–10.5)

## 2016-05-17 MED ORDER — ONDANSETRON HCL 8 MG PO TABS: 8.0000 mg | ORAL_TABLET | Freq: Two times a day (BID) | ORAL | 1 refills | Status: DC | PRN

## 2016-05-17 MED ORDER — PROCHLORPERAZINE MALEATE 10 MG PO TABS: 10.0000 mg | ORAL_TABLET | Freq: Four times a day (QID) | ORAL | 1 refills | Status: DC | PRN

## 2016-05-17 MED ORDER — DEXAMETHASONE 4 MG PO TABS: 8.0000 mg | ORAL_TABLET | Freq: Two times a day (BID) | ORAL | 1 refills | Status: DC

## 2016-05-17 MED ORDER — LIDOCAINE-PRILOCAINE 2.5-2.5 % EX CREA: TOPICAL_CREAM | CUTANEOUS | 3 refills | Status: DC

## 2016-05-17 MED ORDER — LORAZEPAM 0.5 MG PO TABS: 0.5000 mg | ORAL_TABLET | Freq: Four times a day (QID) | ORAL | 0 refills | Status: DC | PRN

## 2016-05-17 NOTE — Progress Notes (Signed)
DIAGNOSIS: Cancer Staging Breast cancer metastasized to axillary lymph node, left (HCC) Staging form: Breast, AJCC 8th Edition - Clinical: Stage IIA (cT2, cN1, cM0, G2, ER: Positive, PR: Positive, HER2: Negative) - Signed by Twana First, MD on 05/07/2016 - Pathologic: No stage assigned - Unsigned   SUMMARY OF ONCOLOGIC HISTORY:   Breast cancer metastasized to axillary lymph node, left ([place])   04/14/2016 Initial Diagnosis    Breast cancer metastasized to axillary lymph node, left ([place])     04/14/2016 Surgery    Left modified radical mastectomy - INVASIVE GRADE 2 DUCTAL CARCINOMA, SPANNING 2.2 CM IN GREATEST DIMENSION. - ASSOCIATED INTERMEDIATE GRADE DUCTAL CARCINOMA IN SITU. - ANTERIOR MARGIN IS BROADLY POSITIVE FOR INVASIVE DUCTAL CARCINOMA IN AREA OF TUMOR (UPPER INNER QUADRANT). - OTHER MARGINS ARE NEGATIVE. - ONE OF SIX LYMPH NODES POSITIVE FOR METASTATIC DUCTAL CARCINOMA (1/6). - ER/PR positive, HER2 negative. - pT2 pN1a cM0      05/14/2016 Imaging    CT chest/abd/pelvis: IMPRESSION: 1. Left mastectomy changes with a seroma. Rounded regions of low attenuation in the axilla adjacent to surgical clips are favored to be postsurgical changes rather than mildly prominent lymph nodes. 2. The left adrenal gland is mildly thickened, likely due to hyperplasia. A tiny 6 mm nodule is likely a small adenoma. 3. Atherosclerotic changes in a non aneurysmal aorta. 4. Anterior hernia mesh is calcified. Fluid just deep to the hernia mesh is likely not acute and of doubtful acute significance.       CURRENT THERAPY:  INTERVAL HISTORY: Margaret Mcdaniel 66 y.o. female returns for follow up. She is accompanied by family today. She denies pain to her surgical site. Overall the patient is without complaint.   is allergic to keflex [cephalexin]; sulfa antibiotics; and latex.    MEDICAL HISTORY: Past Medical History:  Diagnosis Date  . Anxiety   . Arthritis   . Cancer ([place])   . COPD  (chronic obstructive pulmonary disease) ([place])   . Diabetes mellitus without complication ([place])   . Hypothyroidism   . Mental disorder   . Neuromuscular disorder ([place])   . Sleep apnea     SURGICAL HISTORY: Past Surgical History:  Procedure Laterality Date  . ANKLE SURGERY    . APPENDECTOMY    . CHOLECYSTECTOMY    . EXPLORATORY LAPAROTOMY    . HEMORRHOID SURGERY    . MASTECTOMY MODIFIED RADICAL Left 04/14/2016   Procedure: LEFT MODIFIED RADICAL MASTECTOMY;  Surgeon: Aviva Signs, MD;  Location: [facility];  Service: General;  Laterality: Left;  . ROTATOR CUFF REPAIR    . TONSILLECTOMY    . TUBAL LIGATION      SOCIAL HISTORY: Social History   Social History  . Marital status: Divorced    Spouse name: N/A  . Number of children: N/A  . Years of education: N/A   Occupational History  . Not on file.   Social History Main Topics  . Smoking status: Former Smoker    Types: Cigarettes    Quit date: 04/12/2014  . Smokeless tobacco: Never Used  . Alcohol use No  . Drug use: No  . Sexual activity: Not on file   Other Topics Concern  . Not on file   Social History Narrative  . No narrative on file    FAMILY HISTORY: No family history on file.  Review of Systems  Constitutional: Negative.   HENT: Negative.   Eyes: Negative.   Respiratory: Negative.   Cardiovascular: Negative.   Gastrointestinal: Negative.  Genitourinary: Negative.   Musculoskeletal: Negative.        Denies pain to surgical site  Skin: Negative.   Neurological: Negative.   Endo/Heme/Allergies: Negative.   Psychiatric/Behavioral: Negative.   All other systems reviewed and are negative.   PHYSICAL EXAMINATION  ECOG PERFORMANCE STATUS: 1 - Symptomatic but completely ambulatory Vitals:   05/17/16 0949  BP: 140/79  Pulse: (!) 110  Resp: 18  Temp: 98 F (36.7 C)    Physical Exam  Constitutional: She is oriented to person, place, and time and well-developed, well-nourished, and in no distress.   HENT:  Head: Normocephalic and atraumatic.  Eyes: Conjunctivae and EOM are normal. Pupils are equal, round, and reactive to light.  Neck: Normal range of motion. Neck supple.  Cardiovascular: Normal rate, regular rhythm and normal heart sounds.   Pulmonary/Chest: Effort normal and breath sounds normal.  Abdominal: Soft. Bowel sounds are normal.  Musculoskeletal: Normal range of motion.  Neurological: She is alert and oriented to person, place, and time. Gait normal.  Skin: Skin is warm and dry.  Nursing note and vitals reviewed.   LABORATORY DATA:  CBC    Component Value Date/Time   WBC 11.3 (H) 05/17/2016 0910   RBC 4.81 05/17/2016 0910   HGB 13.6 05/17/2016 0910   HCT 42.0 05/17/2016 0910   PLT 218 05/17/2016 0910   MCV 87.3 05/17/2016 0910   MCH 28.3 05/17/2016 0910   MCHC 32.4 05/17/2016 0910   RDW 15.7 (H) 05/17/2016 0910   LYMPHSABS 5.2 (H) 05/17/2016 0910   MONOABS 1.0 05/17/2016 0910   EOSABS 0.2 05/17/2016 0910   BASOSABS 0.0 05/17/2016 0910    CMP     Component Value Date/Time   NA 142 05/17/2016 0910   K 3.6 05/17/2016 0910   CL 108 05/17/2016 0910   CO2 23 05/17/2016 0910   GLUCOSE 223 (H) 05/17/2016 0910   BUN 18 05/17/2016 0910   CREATININE 1.15 (H) 05/17/2016 0910   CALCIUM 9.1 05/17/2016 0910   PROT 7.2 05/17/2016 0910   ALBUMIN 4.0 05/17/2016 0910   AST 33 05/17/2016 0910   ALT 27 05/17/2016 0910   ALKPHOS 63 05/17/2016 0910   BILITOT 0.3 05/17/2016 0910   GFRNONAA 48 (L) 05/17/2016 0910   GFRAA 56 (L) 05/17/2016 0910      ASSESSMENT and THERAPY PLAN:  Breast cancer metastasized to axillary lymph node, left (HCC) Staging form: Breast, AJCC 8th Edition - Clinical: Stage IIA (cT2, cN1, cM0, G2, ER: Positive, PR: Positive, HER2: Negative) - Signed by Twana First, MD on 05/07/2016 - Pathologic: No stage assigned - Unsigned  PLAN: We discussed the patient's recent CT scan, which showed no evidence of malignancy. The scan did show a benign  seroma, approximately 7 cm in size, likely resulting from recent surgery. I will defer to Dr. Arnoldo Morale, her surgeon, regarding whether this seroma needs to be drained. I will also refer patient back to Dr. Arnoldo Morale for chemoport placement as well.   I have recommend for the patient to proceed with 4 cycles of chemotherapy with TC q21 days. We reviewed logistics and side effects of treatment. Start chemotherapy in 2 weeks on 05/31/16.  Following adjuvant chemotherapy she will proceed with adjuvant radiation, which will then be followed by endocrine therapy for 5 years.  The patient will return for follow up in 3 weeks.  All questions were answered. The patient knows to call the clinic with any problems, questions or concerns. We can certainly see the patient  much sooner if necessary.  This note was electronically signed.  I spent 20 minutes counseling the patient face to face. The total time spent in the appointment was 25 minutes and more than 50% was on counseling and review of test results  This document serves as a record of services personally performed by Twana First, MD. It was created on her behalf by Maryla Morrow, a trained medical scribe. The creation of this record is based on the scribe's personal observations and the provider's statements to them. This document has been checked and approved by the attending provider.  Twana First, MD 05/17/2016

## 2016-05-17 NOTE — Patient Instructions (Signed)
[place] at [facility] Discharge Instructions  RECOMMENDATIONS MADE BY THE CONSULTANT AND ANY TEST RESULTS WILL BE SENT TO YOUR REFERRING PHYSICIAN.  You were seen today by Dr. Twana First We will get you to Dr. Arnoldo Morale for port a cath placement Shellia Carwin, RN will be the nurse navigator, you met her today Follow up in 3 weeks See Amy up front for appointments   Thank you for choosing [place] at [facility] to provide your oncology and hematology care.  To afford each patient quality time with our provider, please arrive at least 15 minutes before your scheduled appointment time.    If you have a lab appointment with the [facility] please come in thru the  Main Entrance and check in at the main information desk  You need to re-schedule your appointment should you arrive 10 or more minutes late.  We strive to give you quality time with our providers, and arriving late affects you and other patients whose appointments are after yours.  Also, if you no show three or more times for appointments you may be dismissed from the clinic at the providers discretion.     Again, thank you for choosing [facility].  Our hope is that these requests will decrease the amount of time that you wait before being seen by our physicians.       _____________________________________________________________  Should you have questions after your visit to [facility], please contact our office at [phone] between the hours of 8:30 a.m. and 4:30 p.m.  Voicemails left after 4:30 p.m. will not be returned until the following business day.  For prescription refill requests, have your pharmacy contact our office.       Resources For Cancer Patients and their Caregivers ? American Cancer Society: Can assist with transportation, wigs, general needs, runs Look Good Feel Better.        332-790-9960 ? Cancer Care: Provides  financial assistance, online support groups, medication/co-pay assistance.  1-800-813-HOPE 9475950780) ? [place] Assists [place] cancer patients and their families through emotional , educational and financial support.  (585) 876-8281 ? [place] DSS Where to apply for food stamps, Medicaid and utility assistance. 857-380-3533 ? [facility]: Transportation to medical appointments. 608-493-1176 ? Social Security Administration: May apply for disability if have a Stage IV cancer. (858)710-8706 641-477-0623 ? [employer], Disability and Transit Services: Assists with nutrition, care and transit needs. [place] Support Programs: '@10RELATIVEDAYS'$ @ > Cancer Support Group  2nd Tuesday of the month 1pm-2pm, Journey Room  > Creative Journey  3rd Tuesday of the month 1130am-1pm, Journey Room  > Look Good Feel Better  1st Wednesday of the month 10am-12 noon, Journey Room (Call [place] to register [phone])

## 2016-05-17 NOTE — Patient Instructions (Signed)
[place]    CHEMOTHERAPY INSTRUCTIONS  You have been diagnosed with Stage 2 breast cancer.  We are going to treat you with taxotere and cytoxan.  Dorthula Perfect will be every 21 days.You will see the doctor regularly throughout treatment.  We monitor your lab work prior to every treatment. The doctor monitors your response to treatment by the way you are feeling, your blood work, and scans periodically.    You will receive premeds prior to receiving chemotherapy.  These premeds include: Premeds: Aloxi - high powered nausea/vomiting prevention medication used for chemotherapy patients. Dexamethasone - steroid - given to reduce the risk of you having an allergic type reaction to the chemotherapy. Dex can cause you to feel energized, nervous/anxious/jittery, make you have trouble sleeping, and/or make you feel hot/flushed in the face/neck and/or look pink/red in the face/neck. These side effects will pass as the Dex wears off. (takes 20 minutes to infuse)  You will also get neulasta on-pro after you finish chemotherapy every 3 weeks.   Neulasta - this medication is not chemo but being given because you have had chemo. It is usually given 24-27 hours after the completion of chemotherapy. This medication works by boosting your bone marrow's supply of white blood cells. White blood cells are what protect our bodies against infection. The medication is given in the form of a subcutaneous injection. It is given in the fatty tissue of your abdomen. It is a short needle. The major side effect of this medication is bone or muscle pain. The drug of choice to relieve or lessen the pain is Aleve or Ibuprofen. If a physician has ever told you not to take Aleve or Ibuprofen - then don't take it. You should then take Tylenol/acetaminophen. Take either medication as the bottle directs you to.  The level of pain you experience as a result of this injection can range from none, to mild or  moderate, or severe. Please let us know if you develop moderate or severe bone pain.  You can also take Claritin 10 mg over the counter for a few days after to help with the bone pain.  **DO NOT expose the Neulasta On-body injector to diagnostic imaging (CT scans, MRI, Ultrasound, X-ray), radiation treatment, or oxygen rich environments, such as hyperbaric chambers. **    POTENTIAL SIDE EFFECTS OF TREATMENT:  Cyclophosphamide (Generic Name) Other Names: Cytoxan, Neosar . About This Drug Cyclophosphamide is a drug used to treat cancer. It is given in the vein (IV) or by mouth.  Possible Side Effects (More Common) . Nausea and throwing up (vomiting). These symptoms may happen within a few hours after your treatment and may last up to 72 hours. Medicines are available to stop or lessen these side effects. . Bone marrow depression. This is a decrease in the number of white blood cells, red blood cells, and platelets. This may raise your risk of infection, make you tired and weak (fatigue), and raise your risk of bleeding. . Hair loss: You may notice hair getting thin. Some patients lose their hair. Hair loss is often completescalp hair loss and can involve loss of eyebrows, eyelashes, and pubic hair. You may notice this afew days or weeks after treatment has started. Most often hair loss is temporary; your hair should grow back when treatment is done. . Decreased appetite (decreased hunger) . Blurred vision . Soreness of the mouth and throat. You may have red areas, white patches, or sores that hurt. Marland Kitchen  Effects on the bladder. This drug may cause irritation and bleeding in the bladder. You may have blood in your urine. To help stop this, you will get extra fluids to help you pass more urine. You may get a drug called mesna, which helps to decrease irritation and bleeding. You may also get a medicine to help you pass more urine. You may have a catheter (tube) placed in your bladder so that your  bladder will be washed with this drug.  Possible Side Effects (Less Common) . Darkening of the skin or nails . Metallic taste in the mouth . Changes in lung tissue may happen with large amounts of this drug. These changes may not last forever, and your lung tissue may go back to normal. Sometimes these changes may not be seen for many years. You may get a cough or have trouble catching your breath.  Allergic Reactions Serious allergic reactions including anaphylaxis are rare. While you are getting this drug in your vein (IV), tell your nurse right away if you have any of these symptoms of an allergic reaction: . Trouble catching your breath . Feeling like your tongue or throat are swelling . Feeling your heart beat quickly or in a not normal way (palpitations) . Feeling dizzy or lightheaded . Flushing, itching, rash, and/or hives  Treating Side Effects . Drink 6-8 cups of fluids each day unless your doctor has told you to limit your fluid intake due to some other health problem. A cup is 8 ounces of fluid. If you throw up or have loose bowel movements you should drink more fluids so that you do not become dehydrated (lack water in the body due to losing too much fluid). . Ask your doctor or nurse about medicine that is available to help stop or lessen nausea or throwing up. . Mouth care is very important. Your mouth care should consist of routine, gentle cleaning of your teeth or dentures and rinsing your mouth with a mixture of 1/2 teaspoon of salt in 8 ounces of water or  teaspoon of baking soda in 8 ounces of water. This should be done at least after each meal and at bedtime. . If you have mouth sores, avoid mouthwash that has alcohol. Also avoid alcohol and smoking because they can bother your mouth and throat. . Talk with your nurse about getting a wig before you lose your hair. Also, call the [facility] at 800-ACS-2345 to find out information about the " Look Good.Marland KitchenMarland KitchenFeel  Better" program close to where you live. It is a free program where women undergoing chemotherapy learn about wigs, turbans and scarves as well as makeup techniques and skin and nail care.  Important Information . Whenever you tell a doctor or nurse your health history, always tell them that you have received cyclophosphamide in the past. . If you take this drug by mouth swallow the medicine whole. Do not chew, break or crush it. . You can take the medicine with or without food. If you have nausea, take it with food. Do not take the pills at bedtime.  Food and Drug Interactions There are no known interactions of cyclophosphamide with food. This drug may interact with other medicines. Tell your doctor and pharmacist about all the medicines and dietary supplements (vitamins, minerals, herbs and others) that you are taking at this time. The safety and use of dietary supplements and alternative diets are often not known. Using these might affect your cancer or interfere with your treatment.  Until more is known, you should not use dietary supplements or alternative diets without your cancer doctor's help.  When to Call the Doctor Call your doctor or nurse right away if you have any of these symptoms: . Fever of 100.5 F (38 C) or higher . Chills . Bleeding or bruising that is not normal . Blurred vision or other changes in eyesight . Pain when passing urine; blood in urine . Pain in your lower back or side . Wheezing or trouble breathing . Swelling of legs, ankles, or feet . Feeling dizzy or lightheaded . Feeling confused or agitated . Signs of liver problems: dark urine, pale bowel movements, bad stomach pain, feeling very tired and weak, unusual itching, or yellowing of the eyes or skin . Unusual thirst or passing urine often . Nausea that stops you from eating or drinking . Throwing up more than 3 times a day Call your doctor or nurse as soon as possible if any of these symptoms happen: .  Pain in your mouth or throat that makes it hard to eat or drink . Nausea not relieved by prescribed medicines  Sexual Problems and Reproductive Concerns . Infertility warning: Sexual problems and reproduction concerns may happen. In both men and women, this drug may affect your ability to have children. This cannot be determined before your treatment. Talk with your doctor or nurse if you plan to have children. Ask for information on sperm or egg banking. . In men, this drug may interfere with your ability to make sperm, but it should not change your ability to have sexual relations. . In women, menstrual bleeding may become irregular or stop while you are getting this drug. Do not assume that you cannot become pregnant if you do not have a menstrual period. . Women may go through signs of menopause (change of life) like vaginal dryness or itching. Vaginal lubricants can be used to lessen vaginal dryness, itching, and pain during sexual relations. . Genetic counseling is available for you to talk about the effects of this drug therapy on future pregnancies. Also, a genetic counselor can look at the possible risk of problems in the unborn baby due to this medicine if an exposure happens during pregnancy. . Pregnancy warning: This drug may have harmful effects on the unborn child, so effective methods of birth control should be used during your cancer treatment. . Breast feeding warning: Women should not breast feed during treatment because this drug could enter the breast milk and badly harm a breast feeding baby   Docetaxel (Generic Name) Other Names: Taxotere  About This Drug Docetaxel is used to treat cancer. This drug is given in the vein (IV).  Possible Side Effects (More Common) . Bone marrow depression. This is a decrease in the number of white blood cells, red blood cells, and platelets. This may raise your risk of infection, make you tired and weak (fatigue), and raise your risk of  bleeding. . Swelling of your legs, ankles, and/or feet. Steroids are often given to lessen this swelling. . Hair loss: Hair loss is often complete scalp hair loss and can involve loss of eyebrows, eyelashes, and pubic hair. You may notice this a few days or weeks after treatment has started. There have been cases of permanent hair loss reported. . Loose bowel movements (diarrhea) that may last for a few days. . Nausea and throwing up (vomiting). These symptoms may happen within a few hours after your treatment and may last up to 24  hours. Medicines are available to stop or lessen these side effects. . Soreness of the mouth and throat. You may have red areas, white patches, or sores that hurt. . Effects on the nerves are called peripheral neuropathy. You may feel numbness, tingling, or pain in your hands and feet. It may be hard for you to button your clothes, open jars, or walk as usual. The effect on the nerves may get worse with more doses of the drug. These effects get better in some people after the drug is stopped but it does not get better in all people. . Skin and nail changes. You may develop a rash or have skin redness and swelling followed by peeling. Nail problems may occur such as changes in nail color, nails becoming thin or brittle, or loss of the nail. Steroids are often given to lessen these side effects. . Weakness that interferes with your daily activities. . This drug contains alcohol and may affect your central nervous system. The central nervous system is made up of your brain and spinal cord. You may feel drunk during and after your treatment and it can impair your ability to drive or use machinery for one to two hours after infusion.  Possible Side Effects (Less Common) . Skin and tissue irritation may involve redness, pain, warmth, or swelling at the IV site. This happens if the drug leaks out of the vein and into nearby tissue. . Changes in your liver function. Your doctor will  check your liver function as needed. . Muscle and joint pain. . Effects on the heart: This drug can weaken the heart and lower heart function. Your heart function will be checked as needed. You may have trouble catching your breath, mainly during activities. You may also have trouble breathing while lying down, and have swelling in your ankles. . This drug may cause an increased risk of developing a second cancer. . Skin and tissue irritation may involve redness, pain, warmth, or swelling at the IV site. This happens if the drug leaks out of the vein and into nearby tissue. Marland Kitchen Blurred vision or other changes in eyesight. . A rare risk of death is increased in people with liver problems. Do not take this drug if you have liver disease and talk to your doctor.  Allergic Reactions Serious allergic reactions, including anaphylaxis are rare. While you are getting this drug in your vein (IV), tell your nurse right away if you have any of these symptoms of an allergic reaction: . Trouble catching your breath . Feeling like your tongue or throat are swelling . Feeling your heart beat quickly or in a not normal way (palpitations) . Feeling dizzy or lightheaded . Flushing, itching, rash, and/or hives  Infusion Reactions While you are getting this drug in your vein (IV), you may have a reaction. Your nurse will check you closely for these signs: fever or shaking chills, flushing, facial swelling, feeling dizzy, headache, trouble breathing, rash, itching, chest tightness, or chest pain. Less serious reactions to this drug may also happen. You will be given medicines to help stop or lessen these symptoms. Your vital signs will be checked during the infusion. Tell your doctor or nurse right away if you have any of these symptoms at any time during the infusion and/or for the first 24 hours after getting this drug: . Fever, chills, or shaking chills . Feeling dizzy or lightheaded . Headache . Nausea or  throwing up  Treating Side Effects . Drink 6-8 cups  of fluids every day unless your doctor has told you to limit your fluid intake due to some other health problem. A cup is 8 ounces of fluid. If you throw up or have loose bowel movements, you should drink more fluids so that you do not become dehydrated (lack water in the body due to losing too much fluid). . Talk with your nurse about getting a wig before you lose your hair. Also, call the [facility] at 800-ACS-2345 to find out information about the " Look Good,Feel Better" program close to where you live. It is a free program where women undergoing chemotherapy can learn about wigs, turbans and scarves as well as makeup techniques and skin and nail care. . Do not put anything on a rash unless your doctor or nurse says you may. Keep the area around the rash clean and dry. Ask your doctor for medicine if your rash bothers you. . Mouth care is very important. Your mouth care should consist of routine, gentle cleaning of your teeth or dentures and rinsing your mouth with a mixture of 1/2 teaspoon of salt in 8 ounces of water or  teaspoon of baking soda in 8 ounces of water. This should be done at least after each meal and at bedtime. . If you have mouth sores, avoid mouthwash that has alcohol. Avoid alcohol and smoking because they can bother your mouth and throat. . Ask your doctor or nurse about medicine to stop or lessen loose bowel movements, nausea, throwing up, and joint and muscle pain. . If you have numbness and tingling in your hands and feet, be careful when cooking, walking, and handling sharp objects and hot liquids.  Food and Drug Interactions There are no known interactions of docetaxel with food. This drug may interact with other medicines. Tell your doctor and pharmacist about all the medicines and dietary supplements (vitamins, minerals, herbs and others) that you are taking at this time. The safety and use of dietary  supplements and alternative diets are often not known. Using these might affect your cancer or interfere with your treatment. Until more is known, you should not use dietary supplements or alternative diets without your cancer doctor's help.  When to Call the Doctor Call your doctor or nurse right away if you have any of these symptoms: . Fever of 100.5 F (38 C) or above . Chills . Easy bruising or bleeding . Wheezing or trouble breathing . Rash or itching . Feeling dizzy or lightheaded . Loose bowel movements (diarrhea) more than 4 times a day or diarrhea with weakness or feeling lightheaded . Nausea that stops you from eating or drinking . Throwing up more than 3 times a day . Signs of liver problems: dark urine, pale bowel movements, bad stomach pain, feeling very tired and weak, unusual itching, or yellowing of the eyes or skin . Eye irritation, blurred vision or other changes in eyesight Call your doctor or nurse as soon as possible if any of these symptoms happen: . Decreased urine . Pain in your mouth or throat that makes it hard to eat or drink . Nausea that is not relieved by prescribed medicines . Rash that is not relieved by prescribed medicines . Numbness, tingling, decreased feeling or weakness in fingers, toes, arms, or legs . Trouble walking or changes in the way you walk, feeling clumsy when buttoning clothes, opening jars, or other routine hand motions . Swelling of legs, ankles, or feet . Weight gain of 5 pounds  in one week (fluid retention) . Fatigue that interferes with your daily activities . Headache that does not go away . Extreme weakness that interferes with normal activities . While you are getting this drug, please tell your nurse right away if you have any pain, redness, or swelling at the site of the IV infusion . Symptoms of being drunk, confusion, or being very sleepy  Sexual Problems and Reproductive Concerns . Pregnancy warning: This drug may have  harmful effects on the unborn child, so effective methods of birth control should be used during your cancer treatment. Genetic counseling is available for you to talk about the effects of this drug therapy on future pregnancies. Also, a genetic counselor can look at the possible risk of problems in the unborn baby due to this medicine if an exposure happens during pregnancy. . Breast feeding warning: It is not known if this drug passes into breast milk. For this reason, women should talk to their doctor about the risks and benefits of breast feeding during treatment with this drug because this drug may enter the breast milk and badly harm a breast feeding baby.    SELF IMAGE NEEDS AND REFERRALS MADE: Information on look good feel better given.   EDUCATIONAL MATERIALS GIVEN AND REVIEWED: Chemotherapy and you book given, nutrition book given, information on cytoxan, taxotere, and neulasta.   SELF CARE ACTIVITIES WHILE ON CHEMOTHERAPY:  Hydration Increase your fluid intake 48 hours prior to treatment and drink at least 8 to 12 cups (64 ounces) of water/decaff beverages per day after treatment. You can still have your cup of coffee or soda but these beverages do not count as part of your 8 to 12 cups that you need to drink daily. No alcohol intake.  Medications Continue taking your normal prescription medication as prescribed.  If you start any new herbal or new supplements please let us know first to make sure it is safe.  Mouth Care Have teeth cleaned professionally before starting treatment. Keep dentures and partial plates clean. Use soft toothbrush and do not use mouthwashes that contain alcohol. Biotene is a good mouthwash that is available at most pharmacies or may be ordered by calling [phone]. Use warm salt water gargles (1 teaspoon salt per 1 quart warm water) before and after meals and at bedtime. Or you may rinse with 2 tablespoons of three-percent hydrogen peroxide mixed in  eight ounces of water. If you are still having problems with your mouth or sores in your mouth please call the clinic. If you need dental work, please let the doctor know before you go for your appointment so that we can coordinate the best possible time for you in regards to your chemo regimen. You need to also let your dentist know that you are actively taking chemo. We may need to do labs prior to your dental appointment.   Skin Care Always use sunscreen that has not expired and with SPF (Sun Protection Factor) of 50 or higher. Wear hats to protect your head from the sun. Remember to use sunscreen on your hands, ears, face, & feet.  Use good moisturizing lotions such as udder cream, eucerin, or even Vaseline. Some chemotherapies can cause dry skin, color changes in your skin and nails.    . Avoid long, hot showers or baths. . Use gentle, fragrance-free soaps and laundry detergent. . Use moisturizers, preferably creams or ointments rather than lotions because the thicker consistency is better at preventing skin dehydration. Apply the cream  or ointment within 15 minutes of showering. Reapply moisturizer at night, and moisturize your hands every time after you wash them.  Hair Loss (if your doctor says your hair will fall out)  . If your doctor says that your hair is likely to fall out, decide before you begin chemo whether you want to wear a wig. You may want to shop before treatment to match your hair color. . Hats, turbans, and scarves can also camouflage hair loss, although some people prefer to leave their heads uncovered. If you go bare-headed outdoors, be sure to use sunscreen on your scalp. . Cut your hair short. It eases the inconvenience of shedding lots of hair, but it also can reduce the emotional impact of watching your hair fall out. . Don't perm or color your hair during chemotherapy. Those chemical treatments are already damaging to hair and can enhance hair loss. Once your chemo  treatments are done and your hair has grown back, it's OK to resume dyeing or perming hair. With chemotherapy, hair loss is almost always temporary. But when it grows back, it may be a different color or texture. In older adults who still had hair color before chemotherapy, the new growth may be completely gray.  Often, new hair is very fine and soft.  Infection Prevention Please wash your hands for at least 30 seconds using warm soapy water. Handwashing is the #1 way to prevent the spread of germs. Stay away from sick people or people who are getting over a cold. If you develop respiratory systems such as green/yellow mucus production or productive cough or persistent cough let us know and we will see if you need an antibiotic. It is a good idea to keep a pair of gloves on when going into grocery stores/[employer] to decrease your risk of coming into contact with germs on the carts, etc. Carry alcohol hand gel with you at all times and use it frequently if out in public. If your temperature reaches 100.5 or higher please call the clinic and let us know.  If it is after hours or on the weekend please go to the ER if your temperature is over 100.5.  Please have your own personal thermometer at home to use.    Sex and bodily fluids If you are going to have sex, a condom must be used to protect the person that isn't taking chemotherapy. Chemo can decrease your libido (sex drive). For a few days after chemotherapy, chemotherapy can be excreted through your bodily fluids.  When using the toilet please close the lid and flush the toilet twice.  Do this for a few day after you have had chemotherapy.     Effects of chemotherapy on your sex life Some changes are simple and won't last long. They won't affect your sex life permanently. Sometimes you may feel: . too tired . not strong enough to be very active . sick or sore  . not in the mood . anxious or low Your anxiety might not seem related to sex. For  example, you may be worried about the cancer and how your treatment is going. Or you may be worried about money, or about how you family are coping with your illness. These things can cause stress, which can affect your interest in sex. It's important to talk to your partner about how you feel. Remember - the changes to your sex life don't usually last long. There's usually no medical reason to stop having sex during chemo.  The drugs won't have any long term physical effects on your performance or enjoyment of sex. Cancer can't be passed on to your partner during sex  Contraception It's important to use reliable contraception during treatment. Avoid getting pregnant while you or your partner are having chemotherapy. This is because the drugs may harm the baby. Sometimes chemotherapy drugs can leave a man or woman infertile.  This means you would not be able to have children in the future. You might want to talk to someone about permanent infertility. It can be very difficult to learn that you may no longer be able to have children. Some people find counselling helpful. There might be ways to preserve your fertility, although this is easier for men than for women. You may want to speak to a fertility expert. You can talk about sperm banking or harvesting your eggs. You can also ask about other fertility options, such as donor eggs. If you have or have had breast cancer, your doctor might advise you not to take the contraceptive pill. This is because the hormones in it might affect the cancer.  It is not known for sure whether or not chemotherapy drugs can be passed on through semen or secretions from the vagina. Because of this some doctors advise people to use a barrier method if you have sex during treatment. This applies to vaginal, anal or oral sex. Generally, doctors advise a barrier method only for the time you are actually having the treatment and for about a week after your treatment. Advice like  this can be worrying, but this does not mean that you have to avoid being intimate with your partner. You can still have close contact with your partner and continue to enjoy sex.  Animals If you have cats or birds we just ask that you not change the litter or change the cage.  Please have someone else do this for you while you are on chemotherapy.   Food Safety During and After Cancer Treatment Food safety is important for people both during and after cancer treatment. Cancer and cancer treatments, such as chemotherapy, radiation therapy, and stem cell/bone marrow transplantation, often weaken the immune system. This makes it harder for your body to protect itself from foodborne illness, also called food poisoning. Foodborne illness is caused by eating food that contains harmful bacteria, parasites, or viruses.  Foods to avoid Some foods have a higher risk of becoming tainted with bacteria. These include: Marland Kitchen Unwashed fresh fruit and vegetables, especially leafy vegetables that can hide dirt and other contaminants . Raw sprouts, such as alfalfa sprouts . Raw or undercooked beef, especially ground beef, or other raw or undercooked meat and poultry . Fatty, fried, or spicy foods immediately before or after treatment.  These can sit heavy on your stomach and make you feel nauseous. . Raw or undercooked shellfish, such as oysters. . Sushi and sashimi, which often contain raw fish.  . Unpasteurized beverages, such as unpasteurized fruit juices, raw milk, raw yogurt, or cider . Undercooked eggs, such as soft boiled, over easy, and poached; raw, unpasteurized eggs; or foods made with raw egg, such as homemade raw cookie dough and homemade mayonnaise Simple steps for food safety Shop smart. . Do not buy food stored or displayed in an unclean area. . Do not buy bruised or damaged fruits or vegetables. . Do not buy cans that have cracks, dents, or bulges. . Pick up foods that can spoil at the end of your  shopping  trip and store them in a cooler on the way home. Prepare and clean up foods carefully. . Rinse all fresh fruits and vegetables under running water, and dry them with a clean towel or paper towel. . Clean the top of cans before opening them. . After preparing food, wash your hands for 20 seconds with hot water and soap. Pay special attention to areas between fingers and under nails. . Clean your utensils and dishes with hot water and soap. Marland Kitchen Disinfect your kitchen and cutting boards using 1 teaspoon of liquid, unscented bleach mixed into 1 quart of water.   Dispose of old food. . Eat canned and packaged food before its expiration date (the "use by" or "best before" date). . Consume refrigerated leftovers within 3 to 4 days. After that time, throw out the food. Even if the food does not smell or look spoiled, it still may be unsafe. Some bacteria, such as Listeria, can grow even on foods stored in the refrigerator if they are kept for too long. Take precautions when eating out. . At restaurants, avoid buffets and salad bars where food sits out for a long time and comes in contact with many people. Food can become contaminated when someone with a virus, often a norovirus, or another "bug" handles it. . Put any leftover food in a "to-go" container yourself, rather than having the server do it. And, refrigerate leftovers as soon as you get home. . Choose restaurants that are clean and that are willing to prepare your food as you order it cooked.   MEDICATIONS:   Dexamethasone 4mg  tablet.  Take 2 tablets (8 mg total) by mouth 2 (two) times daily. Start the day before Taxotere. Then again the day after chemo for 3 days.   Zofran/Ondansetron 8mg  tablet. Take 1 tablet every 8 hours as needed for nausea/vomiting. (#1 nausea med to take, this can constipate)  Compazine/Prochlorperazine 10mg  tablet. Take 1 tablet every 6 hours as needed for nausea/vomiting. (#2 nausea med to take, this can make  you sleepy)   EMLA cream. Apply a quarter size amount to port site 1 hour prior to chemo. Do not rub in. Cover with plastic wrap.   Over-the-Counter Meds:  Miralax 1 capful in 8 oz of fluid daily. May increase to two times a day if needed. This is a stool softener. If this doesn't work proceed you can add:  Senokot S-start with 1 tablet two times a day and increase to 4 tablets two times a day if needed. (total of 8 tablets in a 24 hour period). This is a stimulant laxative.   Call us if this does not help your bowels move.   Imodium 2mg  capsule. Take 2 capsules after the 1st loose stool and then 1 capsule every 2 hours until you go a total of 12 hours without having a loose stool. Call the [facility] if loose stools continue. If diarrhea occurs @ bedtime, take 2 capsules @ bedtime. Then take 2 capsules every 4 hours until morning. Call [facility].      Constipation Sheet *Miralax in 8 oz of fluid daily.  May increase to two times a day if needed.  This is a stool softener.  If this not enough to keep your bowel regular:  You can add:  *Senokot S, start with one tablet twice a day and can increase to 4 tablets twice a day if needed.  This is a stimulant laxative.   Sometimes when you take pain medication  you need BOTH a medicine to keep your stool soft and a medicine to help your bowel push it out!  Please call if the above does not work for you.   Do not go more than 2 days without a bowel movement.  It is very important that you do not become constipated.  It will make you feel sick to your stomach (nausea) and can cause abdominal pain and vomiting.      Diarrhea Sheet  If you are having loose stools/diarrhea, please purchase Imodium and begin taking as outlined:  At the first sign of poorly formed or loose stools you should begin taking Imodium(loperamide) 2 mg capsules.  Take two caplets (4mg ) followed by one caplet (2mg ) every 2 hours until you have had no diarrhea  for 12 hours.  During the night take two caplets (4mg ) at bedtime and continue every 4 hours during the night until the morning.  Stop taking Imodium only after there is no sign of diarrhea for 12 hours.    Always call the [facility] if you are having loose stools/diarrhea that you can't get under control.  Loose stools/disrrhea leads to dehydration (loss of water) in your body.  We have other options of trying to get the loose stools/diarrhea to stopped but you must let us know!      Nausea Sheet  Zofran/Ondansetron 8mg  tablet. Take 1 tablet every 8 hours as needed for nausea/vomiting. (#1 nausea med to take, this can constipate)  Compazine/Prochlorperazine 10mg  tablet. Take 1 tablet every 6 hours as needed for nausea/vomiting. (#2 nausea med to take, this can make you sleepy)  You can take these medications together or separately.  We would first like for you to try the Ondansetron by itself and then take the Prochloperizine if needed. But you are allowed to take both medications at the same time if your nausea is that severe.  If you are having persistent nausea (nausea that does not stop) please take these medications on a staggered schedule so that the nausea medication stays in your body.  Please call the [facility] and let us know the amount of nausea that you are experiencing.  If you begin to vomit, you need to call the [facility] and if it is the weekend and you have vomited more than one time and cant get it to stop-go to the Emergency Room.  Persistent nausea/vomiting can lead to dehydration (loss of fluid in your body) and will make you feel terrible.   Ice chips, sips of clear liquids, foods that are @ room temperature, crackers, and toast tend to be better tolerated.     (Please refer to/review other teaching materials that have been provided to you in this blue folder - What to Know During Chemo, What to know After Chemo, Dr. Donald Pore Advice, Constipation Sheet, Diarrhea  Sheet, Nausea Sheet)      SYMPTOMS TO REPORT AS SOON AS POSSIBLE AFTER TREATMENT:  FEVER GREATER THAN 100.5 F  CHILLS WITH OR WITHOUT FEVER  NAUSEA AND VOMITING THAT IS NOT CONTROLLED WITH YOUR NAUSEA MEDICATION  UNUSUAL SHORTNESS OF BREATH  UNUSUAL BRUISING OR BLEEDING  TENDERNESS IN MOUTH AND THROAT WITH OR WITHOUT PRESENCE OF ULCERS  URINARY PROBLEMS  BOWEL PROBLEMS  UNUSUAL RASH    Wear comfortable clothing and clothing appropriate for easy access to any Portacath or PICC line. Let us know if there is anything that we can do to make your therapy better!     What to  do if you need assistance after hours or on the weekends: CALL [phone].  HOLD on the line, do not hang up.  You will hear multiple messages but at the end you will be connected with a nurse triage line.  They will contact the doctor if necessary.  Most of the time they will be able to assist you.   Do not call the hospital operator.     I have been informed and understand all of the instructions given to me and have received a copy. I have been instructed to call the clinic [phone]  or my family physician as soon as possible for continued medical care, if indicated. I do not have any more questions at this time but understand that I may call the [facility] at [phone] during office hours should I have questions or need assistance in obtaining follow-up care.

## 2016-05-18 ENCOUNTER — Telehealth (HOSPITAL_COMMUNITY): Payer: Self-pay | Admitting: Oncology

## 2016-05-18 DIAGNOSIS — E782 Mixed hyperlipidemia: Secondary | ICD-10-CM | POA: Diagnosis not present

## 2016-05-18 DIAGNOSIS — E039 Hypothyroidism, unspecified: Secondary | ICD-10-CM | POA: Diagnosis not present

## 2016-05-18 DIAGNOSIS — C50919 Malignant neoplasm of unspecified site of unspecified female breast: Secondary | ICD-10-CM | POA: Diagnosis not present

## 2016-05-18 DIAGNOSIS — K219 Gastro-esophageal reflux disease without esophagitis: Secondary | ICD-10-CM | POA: Diagnosis not present

## 2016-05-18 DIAGNOSIS — E109 Type 1 diabetes mellitus without complications: Secondary | ICD-10-CM | POA: Diagnosis not present

## 2016-05-18 DIAGNOSIS — E1165 Type 2 diabetes mellitus with hyperglycemia: Secondary | ICD-10-CM | POA: Diagnosis not present

## 2016-05-18 DIAGNOSIS — M1000 Idiopathic gout, unspecified site: Secondary | ICD-10-CM | POA: Diagnosis not present

## 2016-05-18 DIAGNOSIS — R251 Tremor, unspecified: Secondary | ICD-10-CM | POA: Diagnosis not present

## 2016-05-18 NOTE — H&P (Signed)
H&P 05/18/16 Margaret Signs, MD  Surgery    [] Hide copied text  NTS SOAP Note  Vital Mcdaniel:  Vitals as of: 3/71/0626: Systolic 948: Diastolic 82: Heart Rate 107: Temp 97.48F (Temporal): Height 46ft 5in: Weight 201Lbs 0 Ounces: BMI 33.45   BMI : 33.45 kg/m2  Subjective: This 66 year old female presents for of left breast cancer with mets to axilla.  Was found on workup for left breast mass.  [facility].  Patient is schizophrenic, so POA is present with patient.  History limited.  Is aware of cancer diagnosis.  No immediate family history of breast cancer. Patient underwent left modified radical mastectomy and now is referred for Port-A-Cath insertion to undergo chemotherapy.  Review of Symptoms:  Constitutional:fatigue tremors Eyes:negative Nose/Mouth/Throat:negative Cardiovascular:negative Respiratory:dyspnea, cough Gastrointestinnegative Genitourinary:frequency Musculoskeletal:negative Skin:negative as above Hematolgic/Lymphatic:negative Allergic/Immunologic:negative   Past Medical History:Reviewed  Past Medical History  Medical Problems: IDDM, HTN, high cholesterol, schizophrenia Allergies: keflex, sulfa, latex Medications: baby asa, allopurinol, thorizine, levothyroxine, remeron, prilosec, zocor, restoril, zanaflex, invokana, victoza, levemir, novolog   Social History:Reviewed  Social History  Preferred Language: English Race:  White Ethnicity: Not Hispanic / Latino Age: 2 year Marital Status:  M Alcohol: no   Smoking Status: Never smoker reviewed on 04/05/2016 Functional Status reviewed on 04/05/2016 ------------------------------------------------ Bathing: Normal Cooking: Normal Dressing: Normal Driving: Normal Eating: Normal Managing Meds: Normal Oral Care: Normal Shopping: Normal Toileting: Normal Transferring: Normal Walking: Normal Cognitive Status reviewed on  04/05/2016 ------------------------------------------------ Attention: Normal Decision Making: Normal Language: Normal Memory: Normal Motor: Normal Perception: Normal Problem Solving: Normal Visual and Spatial: Normal   Family History:Reviewed  Family Health History Mother, Living; Vaginal cancer; Heart failure;  Father, Living; Heart disease;     Objective Information: General:Well appearing, well nourished in no distress. Head:Atraumatic; no masses; no abnormalities Neck:Supple without lymphadenopathy.  Heart:RRR, no murmur or gallop.  Normal S1, S2.  No S3, S4.  Lungs:CTA bilaterally, no wheezes, rhonchi, rales.  Breathing unlabored. Breast: Left breast with healed surgical scar. There is a seroma present. Mammogram and path reports reviewed. Assessment:Left breast cancer with mets to axilla  Diagnoses: 174.9  C50.912 Primary malignant neoplasm of female breast (Malignant neoplasm of unspecified site of left female breast)     Plan:   scheduled for Port-A-Cath insertion, drainage of seroma on 05/26/2016. The risks and benefits of the procedure including bleeding, infection, pneumothorax were explained to the patient's family and patient, who gave informed consent.

## 2016-05-18 NOTE — Progress Notes (Signed)
Chemotherapy teaching completed.  Consent signed.  Extensive teaching packet given.   

## 2016-05-18 NOTE — Telephone Encounter (Signed)
FAXED PRIOR AUTH REQ TO [facility].

## 2016-05-19 ENCOUNTER — Other Ambulatory Visit (HOSPITAL_COMMUNITY): Payer: PPO

## 2016-05-19 ENCOUNTER — Ambulatory Visit (HOSPITAL_COMMUNITY): Payer: PPO

## 2016-05-19 NOTE — Patient Instructions (Signed)
Margaret Mcdaniel  05/19/2016     @PREFPERIOPPHARMACY @   Your procedure is scheduled on  05/26/2016   Report to [facility] at  840  A.M.  Call this number if you have problems the morning of surgery:  [phone]   Remember:  Do not eat food or drink liquids after midnight.  Take these medicines the morning of surgery with A SIP OF WATER  Allopurinol, thorazine, decadron, hydrocodone, synthroid, ativan, prilosec, zofran, zanaflex, compazine. Take 1/2 of your ususal night time insulin dosage the night before your procedure. DO NOT take any medication for diabetes the morning of surgery.   Do not wear jewelry, make-up or nail polish.  Do not wear lotions, powders, or perfumes, or deoderant.  Do not shave 48 hours prior to surgery.  Men may shave face and neck.  Do not bring valuables to the hospital.  [facility] is not responsible for any belongings or valuables.  Contacts, dentures or bridgework may not be worn into surgery.  Leave your suitcase in the car.  After surgery it may be brought to your room.  For patients admitted to the hospital, discharge time will be determined by your treatment team.  Patients discharged the day of surgery will not be allowed to drive home.   Name and phone number of your driver:   family Special instructions:  None  Please read over the following fact sheets that you were given. Anesthesia Post-op Instructions and Care and Recovery After Surgery       Implanted Port Insertion Implanted port insertion is a procedure to put in a port and catheter. The port is a device with an injectable disk that can be accessed by your health care provider. The port is connected to a vein in the chest or neck by a small flexible tube (catheter). There are different types of ports. The implanted port may be used as a long-term IV access for:  Medicines, such as chemotherapy.  Fluids.  Liquid nutrition, such as total parenteral nutrition  (TPN).  Blood samples. Having a port means that your health care provider will not need to use the veins in your arms for these procedures. Tell a health care provider about:  Any allergies you have.  All medicines you are taking, especially blood thinners, as well as any vitamins, herbs, eye drops, creams, over-the-counter medicines, and steroids.  Any problems you or family members have had with anesthetic medicines.  Any blood disorders you have.  Any surgeries you have had.  Any medical conditions you have, including diabetes or kidney problems.  Whether you are pregnant or may be pregnant. What are the risks? Generally, this is a safe procedure. However, problems may occur, including:  Allergic reactions to medicines or dyes.  Damage to other structures or organs.  Infection.  Damage to the blood vessel, bruising, or bleeding at the puncture site.  Blood clot.  Breakdown of the skin over the port.  A collection of air in the chest that can cause one of the lungs to collapse (pneumothorax). This is rare. What happens before the procedure? Staying hydrated  Follow instructions from your health care provider about hydration, which may include:  Up to 2 hours before the procedure - you may continue to drink clear liquids, such as water, clear fruit juice, black coffee, and plain tea. Eating and drinking restrictions   Follow instructions from your health care provider about eating and drinking, which may include:  8 hours before the procedure - stop eating heavy meals or foods such as meat, fried foods, or fatty foods.  6 hours before the procedure - stop eating light meals or foods, such as toast or cereal.  6 hours before the procedure - stop drinking milk or drinks that contain milk.  2 hours before the procedure - stop drinking clear liquids. Medicines   Ask your health care provider about:  Changing or stopping your regular medicines. This is especially  important if you are taking diabetes medicines or blood thinners.  Taking medicines such as aspirin and ibuprofen. These medicines can thin your blood. Do not take these medicines before your procedure if your health care provider instructs you not to.  You may be given antibiotic medicine to help prevent infection. General instructions   Plan to have someone take you home from the hospital or clinic.  If you will be going home right after the procedure, plan to have someone with you for 24 hours.  You may have blood tests.  You may be asked to shower with a germ-killing soap. What happens during the procedure?  To lower your risk of infection:  Your health care team will wash or sanitize their hands.  Your skin will be washed with soap.  Hair may be removed from the surgical area.  An IV tube will be inserted into one of your veins.  You will be given one or more of the following:  A medicine to help you relax (sedative).  A medicine to numb the area (local anesthetic).  Two small cuts (incisions) will be made to insert the port.  One incision will be made in your neck to get access to the vein where the catheter will lie.  The other incision will be made in the upper chest. This is where the port will lie.  The procedure may be done using continuous X-ray (fluoroscopy) or other imaging tools for guidance.  The port and catheter will be placed. There may be a small, raised area where the port is.  The port will be flushed with a salt solution (saline), and blood will be drawn to make sure that it is working correctly.  The incisions will be closed.  Bandages (dressings) may be placed over the incisions. The procedure may vary among health care providers and hospitals. What happens after the procedure?  Your blood pressure, heart rate, breathing rate, and blood oxygen level will be monitored until the medicines you were given have worn off.  Do not drive for 24  hours if you were given a sedative.  You will be given a manufacturer's information card for the type of port that you have. Keep this with you.  Your port will need to be flushed and checked as told by your health care provider, usually every few weeks.  A chest X-ray will be done to:  Check the placement of the port.  Make sure there is no injury to your lung. Summary  Implanted port insertion is a procedure to put in a port and catheter.  The implanted port is used as a long-term IV access.  The port will need to be flushed and checked as told by your health care provider, usually every few weeks.  Keep your manufacturer's information card with you at all times. This information is not intended to replace advice given to you by your health care provider. Make sure you discuss any questions you have with your health care provider.  Document Released: 12/13/2012 Document Revised: 01/14/2016 Document Reviewed: 01/14/2016 Elsevier Interactive Patient Education  2017 [place] Rem[place], Care After Refer to this sheet in the next few weeks. These instructions provide you with information about caring for yourself after your procedure. Your health care provider may also give you more specific instructions. Your treatment has been planned according to current medical practices, but problems sometimes occur. Call your health care provider if you have any problems or questions after your procedure. What can I expect after the procedure? After the procedure, it is common to have:  Soreness or pain near your incision.  Some swelling or bruising near your incision. Follow these instructions at home: Medicines   Take over-the-counter and prescription medicines only as told by your health care provider.  If you were prescribed an antibiotic medicine, take it as told by your health care provider. Do not stop taking the antibiotic even if you start to feel  better. Bathing   Do not take baths, swim, or use a hot tub until your health care provider approves. Ask your health care provider if you can take showers. You may only be allowed to take sponge baths for bathing. Incision care   Follow instructions from your health care provider about how to take care of your incision. Make sure you:  Wash your hands with soap and water before you change your bandage (dressing). If soap and water are not available, use hand sanitizer.  Change your dressing as told by your health care provider.  Keep your dressing dry.  Leave stitches (sutures), skin glue, or adhesive strips in place. These skin closures may need to stay in place for 2 weeks or longer. If adhesive strip edges start to loosen and curl up, you may trim the loose edges. Do not remove adhesive strips completely unless your health care provider tells you to do that.  Check your incision area every day for signs of infection. Check for:  More redness, swelling, or pain.  More fluid or blood.  Warmth.  Pus or a bad smell. Driving   If you received a sedative, do not drive for 24 hours after the procedure.  If you did not receive a sedative, ask your health care provider when it is safe to drive. Activity   Return to your normal activities as told by your health care provider. Ask your health care provider what activities are safe for you.  Until your health care provider says it is safe:  Do not lift anything that is heavier than 10 lb (4.5 kg).  Do not do activities that involve lifting your arms over your head. General instructions   Do not use any tobacco products, such as cigarettes, chewing tobacco, and e-cigarettes. Tobacco can delay healing. If you need help quitting, ask your health care provider.  Keep all follow-up visits as told by your health care provider. This is important. Contact a health care provider if:  You have more redness, swelling, or pain around your  incision.  You have more fluid or blood coming from your incision.  Your incision feels warm to the touch.  You have pus or a bad smell coming from your incision.  You have a fever.  You have pain that is not relieved by your pain medicine. Get help right away if:  You have chest pain.  You have difficulty breathing. This information is not intended to replace advice given to you by  your health care provider. Make sure you discuss any questions you have with your health care provider. Document Released: 02/03/2015 Document Revised: 07/31/2015 Document Reviewed: 11/27/2014 Elsevier Interactive Patient Education  2017 [place] An implanted port is a type of central line that is placed under the skin. Central lines are used to provide IV access when treatment or nutrition needs to be given through a person's veins. Implanted ports are used for long-term IV access. An implanted port may be placed because:  You need IV medicine that would be irritating to the small veins in your hands or arms.  You need long-term IV medicines, such as antibiotics.  You need IV nutrition for a long period.  You need frequent blood draws for lab tests.  You need dialysis. Implanted ports are usually placed in the chest area, but they can also be placed in the upper arm, the abdomen, or the leg. An implanted port has two main parts:  Reservoir. The reservoir is round and will appear as a small, raised area under your skin. The reservoir is the part where a needle is inserted to give medicines or draw blood.  Catheter. The catheter is a thin, flexible tube that extends from the reservoir. The catheter is placed into a large vein. Medicine that is inserted into the reservoir goes into the catheter and then into the vein. How will I care for my incision site? Do not get the incision site wet. Bathe or shower as directed by your health care provider. How is my port  accessed? Special steps must be taken to access the port:  Before the port is accessed, a numbing cream can be placed on the skin. This helps numb the skin over the port site.  Your health care provider uses a sterile technique to access the port.  Your health care provider must put on a mask and sterile gloves.  The skin over your port is cleaned carefully with an antiseptic and allowed to dry.  The port is gently pinched between sterile gloves, and a needle is inserted into the port.  Only "non-coring" port needles should be used to access the port. Once the port is accessed, a blood return should be checked. This helps ensure that the port is in the vein and is not clogged.  If your port needs to remain accessed for a constant infusion, a clear (transparent) bandage will be placed over the needle site. The bandage and needle will need to be changed every week, or as directed by your health care provider.  Keep the bandage covering the needle clean and dry. Do not get it wet. Follow your health care provider's instructions on how to take a shower or bath while the port is accessed.  If your port does not need to stay accessed, no bandage is needed over the port. What is flushing? Flushing helps keep the port from getting clogged. Follow your health care provider's instructions on how and when to flush the port. Ports are usually flushed with saline solution or a medicine called heparin. The need for flushing will depend on how the port is used.  If the port is used for intermittent medicines or blood draws, the port will need to be flushed:  After medicines have been given.  After blood has been drawn.  As part of routine maintenance.  If a constant infusion is running, the port may not need to be flushed. How long will my port stay  implanted? The port can stay in for as long as your health care provider thinks it is needed. When it is time for the port to come out, surgery will be  done to remove it. The procedure is similar to the one performed when the port was put in. When should I seek immediate medical care? When you have an implanted port, you should seek immediate medical care if:  You notice a bad smell coming from the incision site.  You have swelling, redness, or drainage at the incision site.  You have more swelling or pain at the port site or the surrounding area.  You have a fever that is not controlled with medicine. This information is not intended to replace advice given to you by your health care provider. Make sure you discuss any questions you have with your health care provider. Document Released: 02/22/2005 Document Revised: 07/31/2015 Document Reviewed: 10/30/2012 Elsevier Interactive Patient Education  2017 [employer] PATIENT INSTRUCTIONS POST-ANESTHESIA  IMMEDIATELY FOLLOWING SURGERY:  Do not drive or operate machinery for the first twenty four hours after surgery.  Do not make any important decisions for twenty four hours after surgery or while taking narcotic pain medications or sedatives.  If you develop intractable nausea and vomiting or a severe headache please notify your doctor immediately.  FOLLOW-UP:  Please make an appointment with your surgeon as instructed. You do not need to follow up with anesthesia unless specifically instructed to do so.  WOUND CARE INSTRUCTIONS (if applicable):  Keep a dry clean dressing on the anesthesia/puncture wound site if there is drainage.  Once the wound has quit draining you may leave it open to air.  Generally you should leave the bandage intact for twenty four hours unless there is drainage.  If the epidural site drains for more than 36-48 hours please call the anesthesia department.  QUESTIONS?:  Please feel free to call your physician or the hospital operator if you have any questions, and they will be happy to assist you.

## 2016-05-24 ENCOUNTER — Encounter (HOSPITAL_COMMUNITY)
Admission: RE | Admit: 2016-05-24 | Discharge: 2016-05-24 | Disposition: A | Discharge status: Home / Self Care | Payer: PPO | Source: Ambulatory Visit | Attending: General Surgery | Admitting: General Surgery

## 2016-05-24 ENCOUNTER — Encounter (HOSPITAL_COMMUNITY): Payer: Self-pay

## 2016-05-24 DIAGNOSIS — C773 Secondary and unspecified malignant neoplasm of axilla and upper limb lymph nodes: Secondary | ICD-10-CM | POA: Diagnosis not present

## 2016-05-24 DIAGNOSIS — Z91040 Latex allergy status: Secondary | ICD-10-CM | POA: Diagnosis not present

## 2016-05-24 DIAGNOSIS — Z794 Long term (current) use of insulin: Secondary | ICD-10-CM | POA: Diagnosis not present

## 2016-05-24 DIAGNOSIS — Z1159 Encounter for screening for other viral diseases: Secondary | ICD-10-CM | POA: Diagnosis not present

## 2016-05-24 DIAGNOSIS — Z8049 Family history of malignant neoplasm of other genital organs: Secondary | ICD-10-CM | POA: Diagnosis not present

## 2016-05-24 DIAGNOSIS — E782 Mixed hyperlipidemia: Secondary | ICD-10-CM | POA: Diagnosis not present

## 2016-05-24 DIAGNOSIS — Z79899 Other long term (current) drug therapy: Secondary | ICD-10-CM | POA: Diagnosis not present

## 2016-05-24 DIAGNOSIS — R05 Cough: Secondary | ICD-10-CM | POA: Diagnosis not present

## 2016-05-24 DIAGNOSIS — Z7982 Long term (current) use of aspirin: Secondary | ICD-10-CM | POA: Diagnosis not present

## 2016-05-24 DIAGNOSIS — F209 Schizophrenia, unspecified: Secondary | ICD-10-CM | POA: Diagnosis not present

## 2016-05-24 DIAGNOSIS — J449 Chronic obstructive pulmonary disease, unspecified: Secondary | ICD-10-CM | POA: Diagnosis not present

## 2016-05-24 DIAGNOSIS — Z882 Allergy status to sulfonamides status: Secondary | ICD-10-CM | POA: Diagnosis not present

## 2016-05-24 DIAGNOSIS — E039 Hypothyroidism, unspecified: Secondary | ICD-10-CM | POA: Diagnosis not present

## 2016-05-24 DIAGNOSIS — I10 Essential (primary) hypertension: Secondary | ICD-10-CM | POA: Diagnosis not present

## 2016-05-24 DIAGNOSIS — Z881 Allergy status to other antibiotic agents status: Secondary | ICD-10-CM | POA: Diagnosis not present

## 2016-05-24 DIAGNOSIS — G4730 Sleep apnea, unspecified: Secondary | ICD-10-CM | POA: Diagnosis not present

## 2016-05-24 DIAGNOSIS — Z9981 Dependence on supplemental oxygen: Secondary | ICD-10-CM | POA: Diagnosis not present

## 2016-05-24 DIAGNOSIS — Z9012 Acquired absence of left breast and nipple: Secondary | ICD-10-CM | POA: Diagnosis not present

## 2016-05-24 DIAGNOSIS — E1165 Type 2 diabetes mellitus with hyperglycemia: Secondary | ICD-10-CM | POA: Diagnosis not present

## 2016-05-24 DIAGNOSIS — K219 Gastro-esophageal reflux disease without esophagitis: Secondary | ICD-10-CM | POA: Diagnosis not present

## 2016-05-24 DIAGNOSIS — C50912 Malignant neoplasm of unspecified site of left female breast: Secondary | ICD-10-CM | POA: Diagnosis not present

## 2016-05-24 DIAGNOSIS — F419 Anxiety disorder, unspecified: Secondary | ICD-10-CM | POA: Diagnosis not present

## 2016-05-24 DIAGNOSIS — R0600 Dyspnea, unspecified: Secondary | ICD-10-CM | POA: Diagnosis not present

## 2016-05-24 DIAGNOSIS — E119 Type 2 diabetes mellitus without complications: Secondary | ICD-10-CM | POA: Diagnosis not present

## 2016-05-24 DIAGNOSIS — E7800 Pure hypercholesterolemia, unspecified: Secondary | ICD-10-CM | POA: Diagnosis not present

## 2016-05-24 DIAGNOSIS — L7634 Postprocedural seroma of skin and subcutaneous tissue following other procedure: Secondary | ICD-10-CM | POA: Diagnosis not present

## 2016-05-24 DIAGNOSIS — Z8249 Family history of ischemic heart disease and other diseases of the circulatory system: Secondary | ICD-10-CM | POA: Diagnosis not present

## 2016-05-24 DIAGNOSIS — Y838 Other surgical procedures as the cause of abnormal reaction of the patient, or of later complication, without mention of misadventure at the time of the procedure: Secondary | ICD-10-CM | POA: Diagnosis not present

## 2016-05-24 HISTORY — DX: Other specified postprocedural states: R11.2

## 2016-05-24 HISTORY — DX: Gastro-esophageal reflux disease without esophagitis: K21.9

## 2016-05-24 HISTORY — DX: Other specified postprocedural states: Z98.890

## 2016-05-24 HISTORY — DX: Personal history of urinary calculi: Z87.442

## 2016-05-26 ENCOUNTER — Encounter (HOSPITAL_COMMUNITY): Payer: Self-pay | Admitting: *Deleted

## 2016-05-26 ENCOUNTER — Encounter (HOSPITAL_COMMUNITY)
Admission: RE | Disposition: A | Discharge status: Home / Self Care | Payer: Self-pay | Source: Ambulatory Visit | Attending: General Surgery

## 2016-05-26 ENCOUNTER — Ambulatory Visit (HOSPITAL_COMMUNITY): Payer: PPO | Admitting: Anesthesiology

## 2016-05-26 ENCOUNTER — Ambulatory Visit (HOSPITAL_COMMUNITY)
Admission: RE | Admit: 2016-05-26 | Discharge: 2016-05-26 | Disposition: A | Discharge status: Home / Self Care | Payer: PPO | Source: Ambulatory Visit | Attending: General Surgery | Admitting: General Surgery

## 2016-05-26 ENCOUNTER — Ambulatory Visit (HOSPITAL_COMMUNITY): Payer: PPO

## 2016-05-26 DIAGNOSIS — G4730 Sleep apnea, unspecified: Secondary | ICD-10-CM | POA: Insufficient documentation

## 2016-05-26 DIAGNOSIS — C773 Secondary and unspecified malignant neoplasm of axilla and upper limb lymph nodes: Secondary | ICD-10-CM | POA: Diagnosis not present

## 2016-05-26 DIAGNOSIS — Z9981 Dependence on supplemental oxygen: Secondary | ICD-10-CM | POA: Insufficient documentation

## 2016-05-26 DIAGNOSIS — Y838 Other surgical procedures as the cause of abnormal reaction of the patient, or of later complication, without mention of misadventure at the time of the procedure: Secondary | ICD-10-CM | POA: Insufficient documentation

## 2016-05-26 DIAGNOSIS — Z7982 Long term (current) use of aspirin: Secondary | ICD-10-CM | POA: Insufficient documentation

## 2016-05-26 DIAGNOSIS — Z794 Long term (current) use of insulin: Secondary | ICD-10-CM | POA: Insufficient documentation

## 2016-05-26 DIAGNOSIS — E119 Type 2 diabetes mellitus without complications: Secondary | ICD-10-CM | POA: Diagnosis not present

## 2016-05-26 DIAGNOSIS — Z79899 Other long term (current) drug therapy: Secondary | ICD-10-CM | POA: Insufficient documentation

## 2016-05-26 DIAGNOSIS — Z881 Allergy status to other antibiotic agents status: Secondary | ICD-10-CM | POA: Insufficient documentation

## 2016-05-26 DIAGNOSIS — Z95828 Presence of other vascular implants and grafts: Secondary | ICD-10-CM

## 2016-05-26 DIAGNOSIS — R05 Cough: Secondary | ICD-10-CM | POA: Insufficient documentation

## 2016-05-26 DIAGNOSIS — F419 Anxiety disorder, unspecified: Secondary | ICD-10-CM | POA: Insufficient documentation

## 2016-05-26 DIAGNOSIS — E7800 Pure hypercholesterolemia, unspecified: Secondary | ICD-10-CM | POA: Insufficient documentation

## 2016-05-26 DIAGNOSIS — C50912 Malignant neoplasm of unspecified site of left female breast: Secondary | ICD-10-CM | POA: Insufficient documentation

## 2016-05-26 DIAGNOSIS — Z5111 Encounter for antineoplastic chemotherapy: Secondary | ICD-10-CM | POA: Diagnosis not present

## 2016-05-26 DIAGNOSIS — R0600 Dyspnea, unspecified: Secondary | ICD-10-CM | POA: Insufficient documentation

## 2016-05-26 DIAGNOSIS — Z9012 Acquired absence of left breast and nipple: Secondary | ICD-10-CM | POA: Insufficient documentation

## 2016-05-26 DIAGNOSIS — I10 Essential (primary) hypertension: Secondary | ICD-10-CM | POA: Insufficient documentation

## 2016-05-26 DIAGNOSIS — Z8249 Family history of ischemic heart disease and other diseases of the circulatory system: Secondary | ICD-10-CM | POA: Insufficient documentation

## 2016-05-26 DIAGNOSIS — Z8049 Family history of malignant neoplasm of other genital organs: Secondary | ICD-10-CM | POA: Insufficient documentation

## 2016-05-26 DIAGNOSIS — Z91040 Latex allergy status: Secondary | ICD-10-CM | POA: Insufficient documentation

## 2016-05-26 DIAGNOSIS — E039 Hypothyroidism, unspecified: Secondary | ICD-10-CM | POA: Diagnosis not present

## 2016-05-26 DIAGNOSIS — L7634 Postprocedural seroma of skin and subcutaneous tissue following other procedure: Secondary | ICD-10-CM | POA: Insufficient documentation

## 2016-05-26 DIAGNOSIS — C50919 Malignant neoplasm of unspecified site of unspecified female breast: Secondary | ICD-10-CM | POA: Diagnosis not present

## 2016-05-26 DIAGNOSIS — Z882 Allergy status to sulfonamides status: Secondary | ICD-10-CM | POA: Insufficient documentation

## 2016-05-26 DIAGNOSIS — F209 Schizophrenia, unspecified: Secondary | ICD-10-CM | POA: Diagnosis not present

## 2016-05-26 DIAGNOSIS — J449 Chronic obstructive pulmonary disease, unspecified: Secondary | ICD-10-CM | POA: Insufficient documentation

## 2016-05-26 DIAGNOSIS — K219 Gastro-esophageal reflux disease without esophagitis: Secondary | ICD-10-CM | POA: Insufficient documentation

## 2016-05-26 DIAGNOSIS — Z452 Encounter for adjustment and management of vascular access device: Secondary | ICD-10-CM | POA: Diagnosis not present

## 2016-05-26 HISTORY — DX: Secondary and unspecified malignant neoplasm of axilla and upper limb lymph nodes: C77.3

## 2016-05-26 HISTORY — DX: Malignant neoplasm of unspecified site of left female breast: C50.912

## 2016-05-26 HISTORY — PX: PORTACATH PLACEMENT: SHX2246

## 2016-05-26 LAB — GLUCOSE, CAPILLARY
Glucose-Capillary: 339 mg/dL — ABNORMAL HIGH (ref 65–99)
Glucose-Capillary: 301 mg/dL — ABNORMAL HIGH (ref 65–99)

## 2016-05-26 IMAGING — CR DG CHEST 1V PORT
1 series · 2 of 2 positions shown · non-contrast
Comparison: [DATE] chest radiograph.

CLINICAL DATA: Port-A-Cath placement.  Breast cancer.

EXAM:
PORTABLE CHEST 1 VIEW

[Series 1: portable · 0.17mm/px · 2 of 2 slices shown]
[im 1/2]
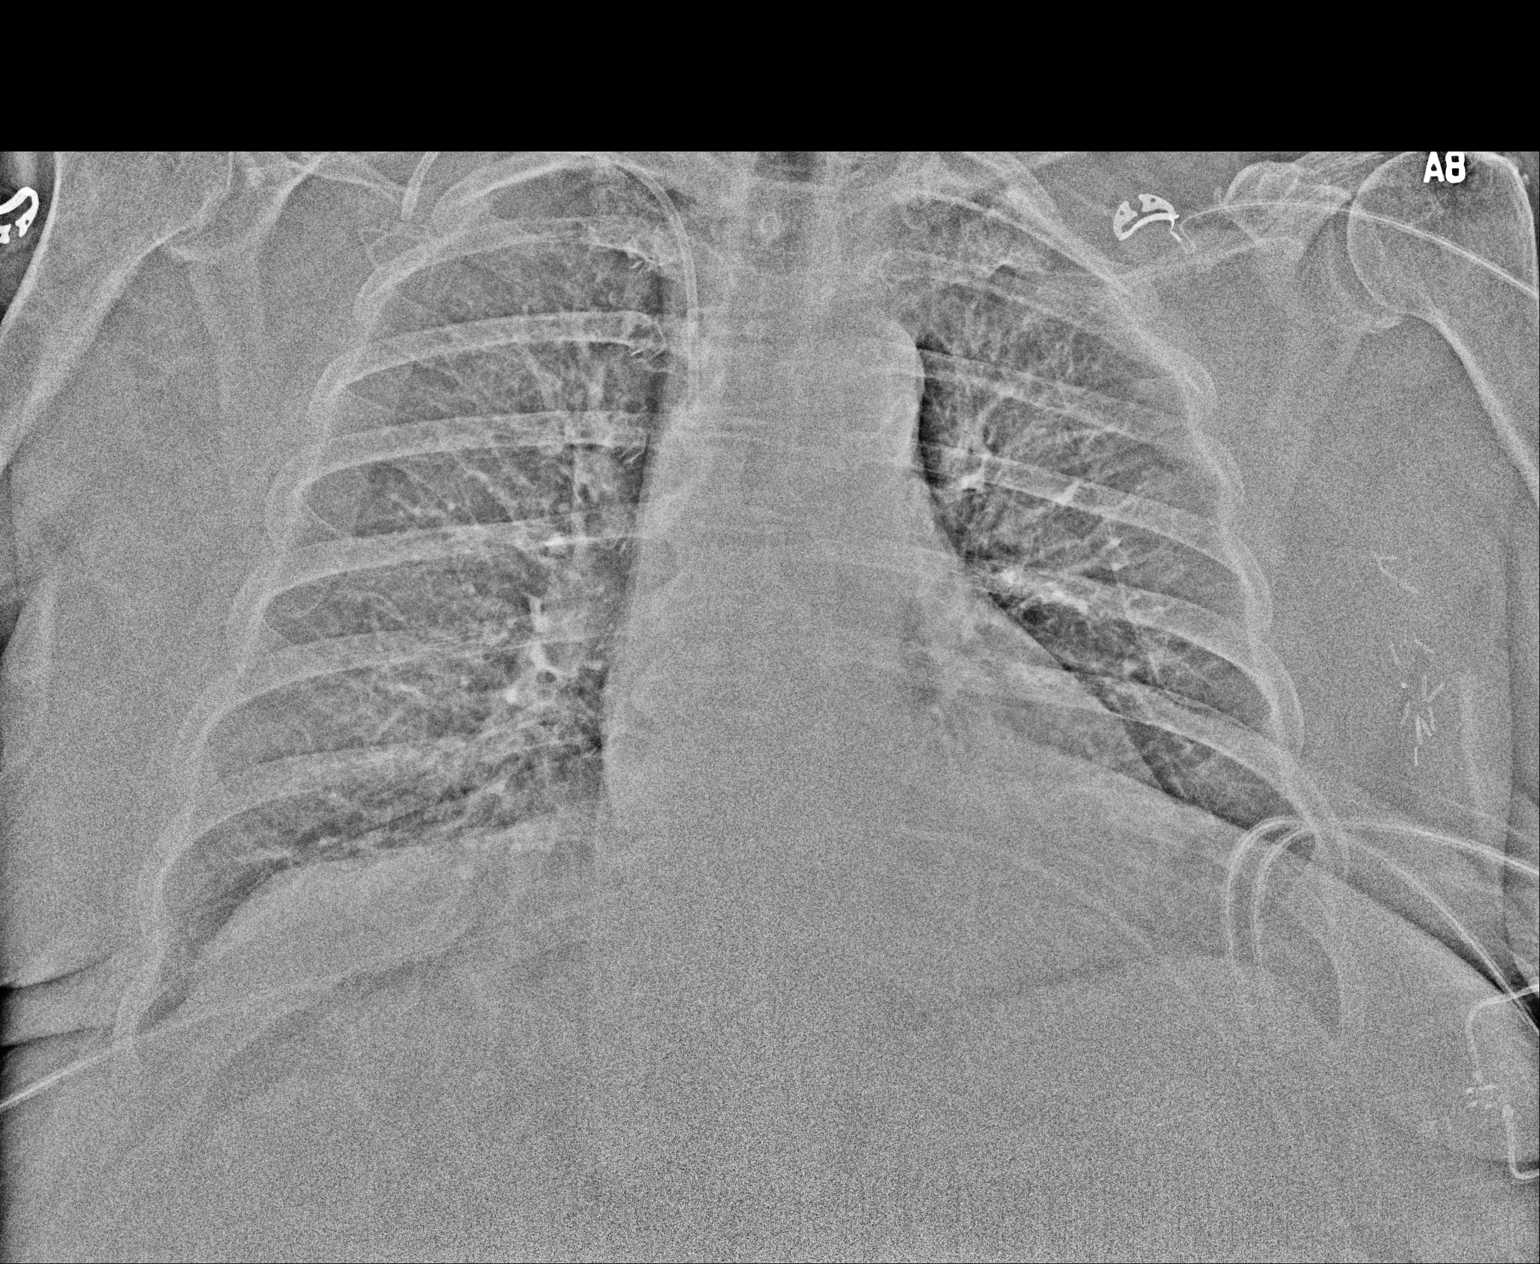
[im 2/2]
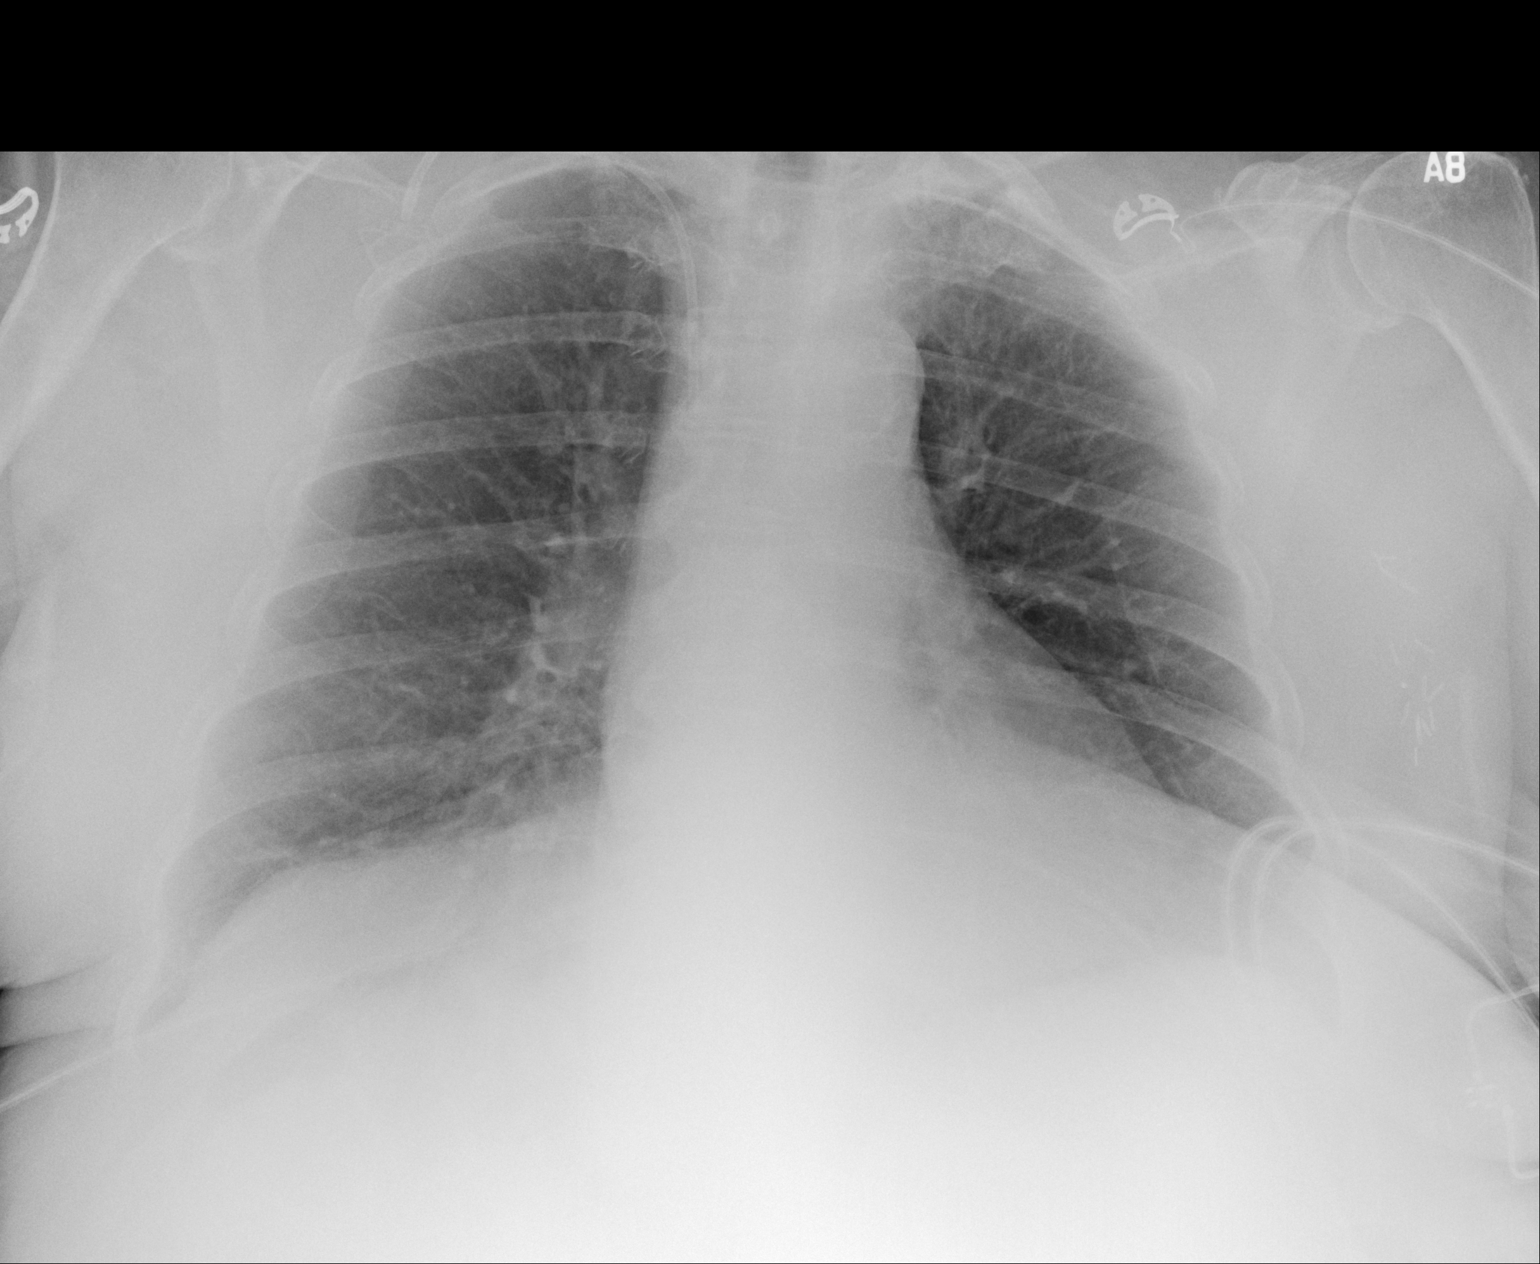

[2 of 2 positions shown; findings below may reference images not displayed]

FINDINGS: Surgical clips overlie the left axilla. Right subclavian MediPort
terminates in the upper third of the superior vena cava. Stable
cardiomediastinal silhouette with normal heart size and aortic
atherosclerosis. No pneumothorax. No pleural effusion. No overt
pulmonary edema. No acute consolidative airspace disease.
IMPRESSION: 1. No pneumothorax.
2. Right subclavian Port-A-Cath with tip in the upper third of the
SCC.
3. No active cardiopulmonary disease.
4. Aortic atherosclerosis.

## 2016-05-26 SURGERY — INSERTION, TUNNELED CENTRAL VENOUS DEVICE, WITH PORT
Anesthesia: Monitor Anesthesia Care

## 2016-05-26 MED ORDER — HEPARIN SOD (PORK) LOCK FLUSH 100 UNIT/ML IV SOLN
INTRAVENOUS | Status: DC | PRN
  Administered 2016-05-26: 500 [IU] via INTRAVENOUS

## 2016-05-26 MED ORDER — FENTANYL CITRATE (PF) 100 MCG/2ML IJ SOLN
INTRAMUSCULAR | Status: AC
  Filled 2016-05-26: qty 2

## 2016-05-26 MED ORDER — PROPOFOL 500 MG/50ML IV EMUL
INTRAVENOUS | Status: DC | PRN
  Administered 2016-05-26: 100 ug/kg/min via INTRAVENOUS

## 2016-05-26 MED ORDER — FENTANYL CITRATE (PF) 100 MCG/2ML IJ SOLN
25.0000 ug | Freq: Once | INTRAMUSCULAR | Status: AC
  Administered 2016-05-26: 25 ug via INTRAVENOUS

## 2016-05-26 MED ORDER — INSULIN ASPART 100 UNIT/ML ~~LOC~~ SOLN
6.0000 [IU] | Freq: Once | SUBCUTANEOUS | Status: AC
  Administered 2016-05-26: 6 [IU] via SUBCUTANEOUS
  Filled 2016-05-26: qty 0.06

## 2016-05-26 MED ORDER — HEPARIN SOD (PORK) LOCK FLUSH 100 UNIT/ML IV SOLN
INTRAVENOUS | Status: AC
  Filled 2016-05-26: qty 5

## 2016-05-26 MED ORDER — LIDOCAINE HCL (PF) 1 % IJ SOLN
INTRAMUSCULAR | Status: AC
Start: 2016-05-26 — End: ?
  Filled 2016-05-26: qty 30

## 2016-05-26 MED ORDER — MIDAZOLAM HCL 2 MG/2ML IJ SOLN
1.0000 mg | INTRAMUSCULAR | Status: AC
  Administered 2016-05-26: 2 mg via INTRAVENOUS

## 2016-05-26 MED ORDER — FENTANYL CITRATE (PF) 100 MCG/2ML IJ SOLN
INTRAMUSCULAR | Status: DC | PRN
  Administered 2016-05-26 (×2): 25 ug via INTRAVENOUS

## 2016-05-26 MED ORDER — CHLORHEXIDINE GLUCONATE CLOTH 2 % EX PADS: 6.0000 | MEDICATED_PAD | Freq: Once | CUTANEOUS | Status: DC

## 2016-05-26 MED ORDER — VANCOMYCIN HCL IN DEXTROSE 1-5 GM/200ML-% IV SOLN
1000.0000 mg | INTRAVENOUS | Status: AC
  Administered 2016-05-26: 1000 mg via INTRAVENOUS
  Filled 2016-05-26: qty 200

## 2016-05-26 MED ORDER — KETOROLAC TROMETHAMINE 30 MG/ML IJ SOLN
INTRAMUSCULAR | Status: AC
  Filled 2016-05-26: qty 1

## 2016-05-26 MED ORDER — FENTANYL CITRATE (PF) 100 MCG/2ML IJ SOLN
25.0000 ug | INTRAMUSCULAR | Status: DC | PRN
  Administered 2016-05-26: 50 ug via INTRAVENOUS

## 2016-05-26 MED ORDER — LACTATED RINGERS IV SOLN
INTRAVENOUS | Status: DC
  Administered 2016-05-26: 10:00:00 via INTRAVENOUS

## 2016-05-26 MED ORDER — LIDOCAINE HCL (PF) 1 % IJ SOLN
INTRAMUSCULAR | Status: AC
  Filled 2016-05-26: qty 5

## 2016-05-26 MED ORDER — LIDOCAINE HCL (PF) 1 % IJ SOLN
INTRAMUSCULAR | Status: DC | PRN
  Administered 2016-05-26: 8 mL

## 2016-05-26 MED ORDER — LIDOCAINE HCL (CARDIAC) 10 MG/ML IV SOLN
INTRAVENOUS | Status: DC | PRN
  Administered 2016-05-26: 30 mg via INTRAVENOUS

## 2016-05-26 MED ORDER — ROCURONIUM BROMIDE 50 MG/5ML IV SOLN
INTRAVENOUS | Status: AC
  Filled 2016-05-26: qty 1

## 2016-05-26 MED ORDER — SODIUM CHLORIDE 0.9 % IV SOLN
INTRAVENOUS | Status: DC | PRN
  Administered 2016-05-26: 50 mL via INTRAMUSCULAR

## 2016-05-26 MED ORDER — MIDAZOLAM HCL 2 MG/2ML IJ SOLN
INTRAMUSCULAR | Status: AC
  Filled 2016-05-26: qty 2

## 2016-05-26 MED ORDER — PROPOFOL 10 MG/ML IV BOLUS
INTRAVENOUS | Status: DC | PRN
  Administered 2016-05-26: 20 mg via INTRAVENOUS

## 2016-05-26 MED ORDER — KETOROLAC TROMETHAMINE 30 MG/ML IJ SOLN
30.0000 mg | Freq: Once | INTRAMUSCULAR | Status: AC
  Administered 2016-05-26: 30 mg via INTRAVENOUS

## 2016-05-26 SURGICAL SUPPLY — 35 items
APPLIER CLIP 9.375 SM OPEN (CLIP)
BAG DECANTER FOR FLEXI CONT (MISCELLANEOUS) ×3 IMPLANT
BAG HAMPER (MISCELLANEOUS) ×3 IMPLANT
CATH HICKMAN DUAL 12.0 (CATHETERS) IMPLANT
CHLORAPREP W/TINT 10.5 ML (MISCELLANEOUS) ×3 IMPLANT
CLIP APPLIE 9.375 SM OPEN (CLIP) IMPLANT
CLOTH BEACON ORANGE TIMEOUT ST (SAFETY) ×3 IMPLANT
COVER LIGHT HANDLE STERIS (MISCELLANEOUS) ×6 IMPLANT
DECANTER SPIKE VIAL GLASS SM (MISCELLANEOUS) ×3 IMPLANT
DERMABOND ADVANCED (GAUZE/BANDAGES/DRESSINGS) ×2
DERMABOND ADVANCED .7 DNX12 (GAUZE/BANDAGES/DRESSINGS) ×1 IMPLANT
DRAPE C-ARM FOLDED MOBILE STRL (DRAPES) ×3 IMPLANT
ELECT REM PT RETURN 9FT ADLT (ELECTROSURGICAL) ×3
ELECTRODE REM PT RTRN 9FT ADLT (ELECTROSURGICAL) ×1 IMPLANT
GLOVE BIOGEL PI IND STRL 7.0 (GLOVE) ×1 IMPLANT
GLOVE BIOGEL PI INDICATOR 7.0 (GLOVE) ×2
GLOVE SURG SS PI 7.5 STRL IVOR (GLOVE) ×3 IMPLANT
GOWN STRL REUS W/TWL LRG LVL3 (GOWN DISPOSABLE) ×6 IMPLANT
IV NS 500ML (IV SOLUTION) ×2
IV NS 500ML BAXH (IV SOLUTION) ×1 IMPLANT
KIT PORT POWER 8FR ISP MRI (Port) ×3 IMPLANT
KIT ROOM TURNOVER APOR (KITS) ×3 IMPLANT
MANIFOLD NEPTUNE II (INSTRUMENTS) ×3 IMPLANT
NEEDLE HYPO 25X1 1.5 SAFETY (NEEDLE) ×3 IMPLANT
PACK MINOR (CUSTOM PROCEDURE TRAY) ×3 IMPLANT
PAD ARMBOARD 7.5X6 YLW CONV (MISCELLANEOUS) ×3 IMPLANT
SET BASIN LINEN APH (SET/KITS/TRAYS/PACK) ×3 IMPLANT
SET INTRODUCER 12FR PACEMAKER (SHEATH) IMPLANT
SHEATH COOK PEEL AWAY SET 8F (SHEATH) IMPLANT
SUT PROLENE 3 0 PS 2 (SUTURE) IMPLANT
SUT VIC AB 3-0 SH 27 (SUTURE) ×2
SUT VIC AB 3-0 SH 27X BRD (SUTURE) ×1 IMPLANT
SUT VIC AB 4-0 PS2 27 (SUTURE) ×3 IMPLANT
SYR 20CC LL (SYRINGE) ×3 IMPLANT
SYR CONTROL 10ML LL (SYRINGE) ×3 IMPLANT

## 2016-05-26 NOTE — Discharge Instructions (Signed)
Implanted Port Home Guide °An implanted port is a type of central line that is placed under the skin. Central lines are used to provide IV access when treatment or nutrition needs to be given through a person’s veins. Implanted ports are used for long-term IV access. An implanted port may be placed because: °· You need IV medicine that would be irritating to the small veins in your hands or arms. °· You need long-term IV medicines, such as antibiotics. °· You need IV nutrition for a long period. °· You need frequent blood draws for lab tests. °· You need dialysis. ° °Implanted ports are usually placed in the chest area, but they can also be placed in the upper arm, the abdomen, or the leg. An implanted port has two main parts: °· Reservoir. The reservoir is round and will appear as a small, raised area under your skin. The reservoir is the part where a needle is inserted to give medicines or draw blood. °· Catheter. The catheter is a thin, flexible tube that extends from the reservoir. The catheter is placed into a large vein. Medicine that is inserted into the reservoir goes into the catheter and then into the vein. ° °How will I care for my incision site? °Do not get the incision site wet. Bathe or shower as directed by your health care provider. °How is my port accessed? °Special steps must be taken to access the port: °· Before the port is accessed, a numbing cream can be placed on the skin. This helps numb the skin over the port site. °· Your health care provider uses a sterile technique to access the port. °? Your health care provider must put on a mask and sterile gloves. °? The skin over your port is cleaned carefully with an antiseptic and allowed to dry. °? The port is gently pinched between sterile gloves, and a needle is inserted into the port. °· Only "non-coring" port needles should be used to access the port. Once the port is accessed, a blood return should be checked. This helps ensure that the port  is in the vein and is not clogged. °· If your port needs to remain accessed for a constant infusion, a clear (transparent) bandage will be placed over the needle site. The bandage and needle will need to be changed every week, or as directed by your health care provider. °· Keep the bandage covering the needle clean and dry. Do not get it wet. Follow your health care provider’s instructions on how to take a shower or bath while the port is accessed. °· If your port does not need to stay accessed, no bandage is needed over the port. ° °What is flushing? °Flushing helps keep the port from getting clogged. Follow your health care provider’s instructions on how and when to flush the port. Ports are usually flushed with saline solution or a medicine called heparin. The need for flushing will depend on how the port is used. °· If the port is used for intermittent medicines or blood draws, the port will need to be flushed: °? After medicines have been given. °? After blood has been drawn. °? As part of routine maintenance. °· If a constant infusion is running, the port may not need to be flushed. ° °How long will my port stay implanted? °The port can stay in for as long as your health care provider thinks it is needed. When it is time for the port to come out, surgery will be   done to remove it. The procedure is similar to the one performed when the port was put in. °When should I seek immediate medical care? °When you have an implanted port, you should seek immediate medical care if: °· You notice a bad smell coming from the incision site. °· You have swelling, redness, or drainage at the incision site. °· You have more swelling or pain at the port site or the surrounding area. °· You have a fever that is not controlled with medicine. ° °This information is not intended to replace advice given to you by your health care provider. Make sure you discuss any questions you have with your health care provider. °Document  Released: 02/22/2005 Document Revised: 07/31/2015 Document Reviewed: 10/30/2012 °Elsevier Interactive Patient Education © 2017 [employer] °Implanted Port Insertion, Care After °This sheet gives you information about how to care for yourself after your procedure. Your health care provider may also give you more specific instructions. If you have problems or questions, contact your health care provider. °What can I expect after the procedure? °After your procedure, it is common to have: °· Discomfort at the port insertion site. °· Bruising on the skin over the port. This should improve over 3-4 days. ° °Follow these instructions at home: °Port care °· After your port is placed, you will get a manufacturer's information card. The card has information about your port. Keep this card with you at all times. °· Take care of the port as told by your health care provider. Ask your health care provider if you or a family member can get training for taking care of the port at home. A home health care nurse may also take care of the port. °· Make sure to remember what type of port you have. °Incision care °· Follow instructions from your health care provider about how to take care of your port insertion site. Make sure you: °? Wash your hands with soap and water before you change your bandage (dressing). If soap and water are not available, use hand sanitizer. °? Change your dressing as told by your health care provider. °? Leave stitches (sutures), skin glue, or adhesive strips in place. These skin closures may need to stay in place for 2 weeks or longer. If adhesive strip edges start to loosen and curl up, you may trim the loose edges. Do not remove adhesive strips completely unless your health care provider tells you to do that. °· Check your port insertion site every day for signs of infection. Check for: °? More redness, swelling, or pain. °? More fluid or blood. °? Warmth. °? Pus or a bad smell. °General  instructions °· Do not take baths, swim, or use a hot tub until your health care provider approves. °· Do not lift anything that is heavier than 10 lb (4.5 kg) for a week, or as told by your health care provider. °· Ask your health care provider when it is okay to: °? Return to work or school. °? Resume usual physical activities or sports. °· Do not drive for 24 hours if you were given a medicine to help you relax (sedative). °· Take over-the-counter and prescription medicines only as told by your health care provider. °· Wear a medical alert bracelet in case of an emergency. This will tell any health care providers that you have a port. °· Keep all follow-up visits as told by your health care provider. This is important. °Contact a health care provider if: °· You cannot   flush your port with saline as directed, or you cannot draw blood from the port. °· You have a fever or chills. °· You have more redness, swelling, or pain around your port insertion site. °· You have more fluid or blood coming from your port insertion site. °· Your port insertion site feels warm to the touch. °· You have pus or a bad smell coming from the port insertion site. °Get help right away if: °· You have chest pain or shortness of breath. °· You have bleeding from your port that you cannot control. °Summary °· Take care of the port as told by your health care provider. °· Change your dressing as told by your health care provider. °· Keep all follow-up visits as told by your health care provider. °This information is not intended to replace advice given to you by your health care provider. Make sure you discuss any questions you have with your health care provider. °Document Released: 12/13/2012 Document Revised: 01/14/2016 Document Reviewed: 01/14/2016 °Elsevier Interactive Patient Education © 2017 [employer] ° °

## 2016-05-26 NOTE — Anesthesia Postprocedure Evaluation (Signed)
Anesthesia Post Note  Patient: Margaret Mcdaniel  Procedure(s) Performed: Procedure(s) (LRB): INSERTION PORT-A-CATH RIGHT SUBCLAVIAN AND  DRAINAGE OF SEROMA LEFT CHEST (N/A)  Patient location during evaluation: PACU Anesthesia Type: MAC Level of consciousness: awake Pain management: satisfactory to patient Vital Signs Assessment: post-procedure vital signs reviewed and stable Respiratory status: spontaneous breathing and patient connected to nasal cannula oxygen Cardiovascular status: stable Anesthetic complications: no     Last Vitals:  Vitals:   05/26/16 1106 05/26/16 1115  BP: 129/90   Pulse: 84 72  Resp:  15  Temp:      Last Pain:  Vitals:   05/26/16 0917  TempSrc: Oral  PainSc: 8                  Minie Roadcap

## 2016-05-26 NOTE — Anesthesia Procedure Notes (Signed)
Procedure Name: MAC Date/Time: 05/26/2016 10:35 AM Performed by: Vista Deck Pre-anesthesia Checklist: Patient identified, Emergency Drugs available, Suction available, Timeout performed and Patient being monitored Patient Re-evaluated:Patient Re-evaluated prior to inductionOxygen Delivery Method: Non-rebreather mask

## 2016-05-26 NOTE — Progress Notes (Signed)
CXR done

## 2016-05-26 NOTE — Anesthesia Preprocedure Evaluation (Signed)
Anesthesia Evaluation  Patient identified by MRN, date of birth, ID band Patient awake    Reviewed: Allergy & Precautions, NPO status , Patient's Chart, lab work & pertinent test results  History of Anesthesia Complications (+) PONV and history of anesthetic complications  Airway Mallampati: III  TM Distance: <3 FB Neck ROM: Full  Mouth opening: Limited Mouth Opening  Dental  (+) Teeth Intact   Pulmonary sleep apnea and Continuous Positive Airway Pressure Ventilation , COPD,  COPD inhaler and oxygen dependent, former smoker,    breath sounds clear to auscultation       Cardiovascular negative cardio ROS   Rhythm:Regular Rate:Normal     Neuro/Psych PSYCHIATRIC DISORDERS Anxiety    GI/Hepatic negative GI ROS, GERD  Controlled and Medicated,  Endo/Other  diabetes, Type 2Hypothyroidism   Renal/GU      Musculoskeletal   Abdominal   Peds  Hematology   Anesthesia Other Findings   Reproductive/Obstetrics                             Anesthesia Physical Anesthesia Plan  ASA: III  Anesthesia Plan: MAC   Post-op Pain Management:    Induction: Intravenous  Airway Management Planned: Simple Face Mask  Additional Equipment:   Intra-op Plan:   Post-operative Plan:   Informed Consent: I have reviewed the patients History and Physical, chart, labs and discussed the procedure including the risks, benefits and alternatives for the proposed anesthesia with the patient or authorized representative who has indicated his/her understanding and acceptance.     Plan Discussed with:   Anesthesia Plan Comments:         Anesthesia Quick Evaluation

## 2016-05-26 NOTE — Anesthesia Procedure Notes (Signed)
Procedure Name: MAC Date/Time: 05/26/2016 10:16 AM Performed by: Vista Deck Pre-anesthesia Checklist: Patient identified, Emergency Drugs available, Suction available, Timeout performed and Patient being monitored Patient Re-evaluated:Patient Re-evaluated prior to inductionOxygen Delivery Method: Nasal Cannula

## 2016-05-26 NOTE — Op Note (Signed)
Patient:  Margaret Mcdaniel  DOB:  [birth year]  MRN:  [id number]   Preop Diagnosis:  Left breast carcinoma, need for chemotherapy, postoperative wound seroma  Postop Diagnosis:  Same  Procedure:  Port-A-Cath insertion, needle aspiration of wound seroma  Surgeon:  Aviva Signs, M.D.  Anes:  Mac  Indications:  Patient is a 66 year old white female status post left modified radical mastectomy for left breast carcinoma who now presents for Port-A-Cath insertion. She is about to undergo chemotherapy. In addition, she has a seroma in her left modified radical mastectomy site. This be aspirated the time of surgery. The risks and benefits of the procedures including bleeding, infection, and the possibility of a pneumothorax were fully explained to the patient, who gave informed consent.  Procedure note:  The patient was placed in the Trendelenburg position after the upper chest was prepped and draped using the usual sterile technique with DuraPrep. Surgical site confirmation was performed. One percent Xylocaine was used for local anesthesia.  The patient was placed in Trendelenburg position. An incision was made below the right clavicle. A subcutaneous pocket was formed. The needles advanced into the right subclavian vein using the Seldinger technique without difficulty. A guidewire was then advanced into the right atrium under fluoroscopic guidance. An introducer and peel-away sheath were placed over the guidewire. The catheter was then inserted through the peel-away sheath and the peel-away sheath was removed. The catheter was then attached to the port and the port placed in subcutaneous pocket. Adequate positioning was confirmed by fluoroscopy. Good backflow of blood was noted on aspiration of the port. The port was flushed with heparin flush. Subcutaneous layer was reapproximated using a 3-0 Vicryl interrupted suture. The skin was closed using a 4-0 Vicryl subcuticular suture. Dermabond was then  applied.  Next, needle aspiration of a seroma noted along the lateral aspect of the modified radical mastectomy site on the left was performed. 40 mL of a seroma was aspirated. A dressing was then applied.  All tape and needle counts were correct at the end of the procedures. The patient was awakened and transferred to PACU in stable condition. A chest x-ray will be performed at that time.  Complications:  None  EBL:  Minimal  Specimen:  None

## 2016-05-26 NOTE — Transfer of Care (Signed)
Immediate Anesthesia Transfer of Care Note  Patient: Margaret Mcdaniel  Procedure(s) Performed: Procedure(s): INSERTION PORT-A-CATH RIGHT SUBCLAVIAN AND  DRAINAGE OF SEROMA LEFT CHEST (N/A)  Patient Location: PACU  Anesthesia Type:MAC  Level of Consciousness: awake and patient cooperative  Airway & Oxygen Therapy: Patient Spontanous Breathing and Patient connected to nasal cannula oxygen  Post-op Assessment: Report given to RN and Post -op Vital signs reviewed and stable  Post vital signs: Reviewed and stable  Last Vitals:  Vitals:   05/26/16 1005 05/26/16 1010  BP: (!) 141/88 (!) 146/101  Resp: 18 17  Temp:      Last Pain:  Vitals:   05/26/16 0917  TempSrc: Oral  PainSc: 8       Patients Stated Pain Goal: 4 (74/71/85 5015)  Complications: No apparent anesthesia complications

## 2016-05-26 NOTE — Interval H&P Note (Signed)
History and Physical Interval Note:  05/26/2016 9:35 AM  [name]  has presented today for surgery, with the diagnosis of left breast cancer  The various methods of treatment have been discussed with the patient and family. After consideration of risks, benefits and other options for treatment, the patient has consented to  Procedure(s): INSERTION PORT-A-CATH (N/A) as a surgical intervention .  The patient's history has been reviewed, patient examined, no change in status, stable for surgery.  I have reviewed the patient's chart and labs.  Questions were answered to the patient's satisfaction.     Aviva Signs

## 2016-05-28 ENCOUNTER — Encounter (HOSPITAL_COMMUNITY): Payer: Self-pay | Admitting: General Surgery

## 2016-05-31 ENCOUNTER — Encounter (HOSPITAL_COMMUNITY): Payer: Self-pay

## 2016-05-31 ENCOUNTER — Encounter (HOSPITAL_BASED_OUTPATIENT_CLINIC_OR_DEPARTMENT_OTHER): Payer: PPO

## 2016-05-31 ENCOUNTER — Inpatient Hospital Stay (HOSPITAL_COMMUNITY): Payer: Self-pay

## 2016-05-31 VITALS — BP 155/90 | HR 84 | Temp 98.0°F | Resp 16 | Wt 196.3 lb

## 2016-05-31 DIAGNOSIS — E119 Type 2 diabetes mellitus without complications: Secondary | ICD-10-CM | POA: Diagnosis not present

## 2016-05-31 DIAGNOSIS — C50912 Malignant neoplasm of unspecified site of left female breast: Secondary | ICD-10-CM

## 2016-05-31 DIAGNOSIS — R739 Hyperglycemia, unspecified: Secondary | ICD-10-CM

## 2016-05-31 DIAGNOSIS — C773 Secondary and unspecified malignant neoplasm of axilla and upper limb lymph nodes: Secondary | ICD-10-CM

## 2016-05-31 DIAGNOSIS — Z5111 Encounter for antineoplastic chemotherapy: Secondary | ICD-10-CM | POA: Diagnosis not present

## 2016-05-31 LAB — COMPREHENSIVE METABOLIC PANEL
ALBUMIN: 3.8 g/dL (ref 3.5–5.0)
ALT: 26 U/L (ref 14–54)
AST: 34 U/L (ref 15–41)
Alkaline Phosphatase: 70 U/L (ref 38–126)
Anion gap: 16 — ABNORMAL HIGH (ref 5–15)
BUN: 27 mg/dL — AB (ref 6–20)
CHLORIDE: 97 mmol/L — AB (ref 101–111)
CO2: 19 mmol/L — ABNORMAL LOW (ref 22–32)
Calcium: 9.3 mg/dL (ref 8.9–10.3)
Creatinine, Ser: 1.32 mg/dL — ABNORMAL HIGH (ref 0.44–1.00)
GFR calc Af Amer: 47 mL/min — ABNORMAL LOW (ref >60)
GFR, EST NON AFRICAN AMERICAN: 41 mL/min — AB (ref >60)
GLUCOSE: 511 mg/dL — AB (ref 65–99)
Potassium: 4.2 mmol/L (ref 3.5–5.1)
Sodium: 132 mmol/L — ABNORMAL LOW (ref 135–145)
Total Bilirubin: 0.5 mg/dL (ref 0.3–1.2)
Total Protein: 7 g/dL (ref 6.5–8.1)

## 2016-05-31 LAB — CBC WITH DIFFERENTIAL/PLATELET
BASOS ABS: 0 10*3/uL (ref 0.0–0.1)
Basophils Relative: 0 %
Eosinophils Absolute: 0 10*3/uL (ref 0.0–0.7)
Eosinophils Relative: 0 %
HCT: 38.9 % (ref 36.0–46.0)
Hemoglobin: 12.7 g/dL (ref 12.0–15.0)
Lymphocytes Relative: 17 %
Lymphs Abs: 2.6 10*3/uL (ref 0.7–4.0)
MCH: 28.2 pg (ref 26.0–34.0)
MCHC: 32.6 g/dL (ref 30.0–36.0)
MCV: 86.3 fL (ref 78.0–100.0)
Monocytes Absolute: 0.8 10*3/uL (ref 0.1–1.0)
Monocytes Relative: 6 %
NEUTROS ABS: 11.5 10*3/uL — AB (ref 1.7–7.7)
Neutrophils Relative %: 77 %
PLATELETS: 238 10*3/uL (ref 150–400)
RBC: 4.51 MIL/uL (ref 3.87–5.11)
RDW: 14.8 % (ref 11.5–15.5)
WBC: 14.9 10*3/uL — AB (ref 4.0–10.5)

## 2016-05-31 MED ORDER — CYCLOPHOSPHAMIDE CHEMO INJECTION 1 GM
600.0000 mg/m2 | Freq: Once | INTRAMUSCULAR | Status: AC
  Administered 2016-05-31: 1200 mg via INTRAVENOUS
  Filled 2016-05-31: qty 50

## 2016-05-31 MED ORDER — PALONOSETRON HCL INJECTION 0.25 MG/5ML
INTRAVENOUS | Status: AC
  Filled 2016-05-31: qty 5

## 2016-05-31 MED ORDER — SODIUM CHLORIDE 0.9 % IV SOLN
Freq: Once | INTRAVENOUS | Status: AC
  Administered 2016-05-31: 11:00:00 via INTRAVENOUS

## 2016-05-31 MED ORDER — INSULIN ASPART 100 UNIT/ML ~~LOC~~ SOLN
16.0000 [IU] | Freq: Once | SUBCUTANEOUS | Status: AC
  Administered 2016-05-31: 16 [IU] via SUBCUTANEOUS
  Filled 2016-05-31: qty 0.16

## 2016-05-31 MED ORDER — DEXAMETHASONE SODIUM PHOSPHATE 10 MG/ML IJ SOLN
INTRAMUSCULAR | Status: AC
  Filled 2016-05-31: qty 1

## 2016-05-31 MED ORDER — DOCETAXEL CHEMO INJECTION 160 MG/16ML
75.0000 mg/m2 | Freq: Once | INTRAVENOUS | Status: AC
  Administered 2016-05-31: 150 mg via INTRAVENOUS
  Filled 2016-05-31: qty 15

## 2016-05-31 MED ORDER — SODIUM CHLORIDE 0.9 % IV SOLN: 10.0000 mg | Freq: Once | INTRAVENOUS | Status: DC

## 2016-05-31 MED ORDER — DEXAMETHASONE SODIUM PHOSPHATE 10 MG/ML IJ SOLN
10.0000 mg | Freq: Once | INTRAMUSCULAR | Status: AC
  Administered 2016-05-31: 10 mg via INTRAVENOUS

## 2016-05-31 MED ORDER — HEPARIN SOD (PORK) LOCK FLUSH 100 UNIT/ML IV SOLN
500.0000 [IU] | Freq: Once | INTRAVENOUS | Status: AC | PRN
  Administered 2016-05-31: 500 [IU]
  Filled 2016-05-31: qty 5

## 2016-05-31 MED ORDER — PALONOSETRON HCL INJECTION 0.25 MG/5ML
0.2500 mg | Freq: Once | INTRAVENOUS | Status: AC
  Administered 2016-05-31: 0.25 mg via INTRAVENOUS

## 2016-05-31 MED ORDER — SODIUM CHLORIDE 0.9% FLUSH
10.0000 mL | INTRAVENOUS | Status: DC | PRN
  Administered 2016-05-31: 10 mL
  Filled 2016-05-31: qty 10

## 2016-05-31 NOTE — Progress Notes (Signed)
CRITICAL VALUE ALERT Critical value received:  Glucose- 511 Date of notification:  05/31/16 Time of notification: 7001 Critical value read back:  Yes.   Nurse who received alert:  M.Aubre Quincy, LPN MD notified (1st page):  T.Kefalas

## 2016-05-31 NOTE — Patient Instructions (Signed)
[facility] Discharge Instructions for Patients Receiving Chemotherapy   Beginning January 23rd 2017 lab work for the Cancer Center will be done in the  Main lab at [facility] on 1st floor. If you have a lab appointment with the Cancer Center please come in thru the  Main Entrance and check in at the main information desk   Today you received the following chemotherapy agents   To help prevent nausea and vomiting after your treatment, we encourage you to take your nausea medication     If you develop nausea and vomiting, or diarrhea that is not controlled by your medication, call the clinic.  The clinic phone number is [phone]. Office hours are Monday-Friday 8:30am-5:00pm.  BELOW ARE SYMPTOMS THAT SHOULD BE REPORTED IMMEDIATELY:  *FEVER GREATER THAN 101.0 F  *CHILLS WITH OR WITHOUT FEVER  NAUSEA AND VOMITING THAT IS NOT CONTROLLED WITH YOUR NAUSEA MEDICATION  *UNUSUAL SHORTNESS OF BREATH  *UNUSUAL BRUISING OR BLEEDING  TENDERNESS IN MOUTH AND THROAT WITH OR WITHOUT PRESENCE OF ULCERS  *URINARY PROBLEMS  *BOWEL PROBLEMS  UNUSUAL RASH Items with * indicate a potential emergency and should be followed up as soon as possible. If you have an emergency after office hours please contact your primary care physician or go to the nearest emergency department.  Please call the clinic during office hours if you have any questions or concerns.   You may also contact the Patient Navigator at [phone] should you have any questions or need assistance in obtaining follow up care.      Resources For Cancer Patients and their Caregivers ? American Cancer Society: Can assist with transportation, wigs, general needs, runs Look Good Feel Better.        1-888-227-6333 ? Cancer Care: Provides financial assistance, online support groups, medication/co-pay assistance.  1-800-813-HOPE (4673) ? [facility] Assists [place] cancer  patients and their families through emotional , educational and financial support.  336-427-4357 ? [place] DSS Where to apply for food stamps, Medicaid and utility assistance. 336-342-1394 ? RCATS: Transportation to medical appointments. 336-347-2287 ? Social Security Administration: May apply for disability if have a Stage IV cancer. 336-342-7796 1-800-772-1213 ? [place] Aging, Disability and Transit Services: Assists with nutrition, care and transit needs. 336-349-2343         

## 2016-05-31 NOTE — Progress Notes (Signed)
Blood sugar 511, MD notified and order given for insulin.Labs reviewed with MD, proceed with treatment.  1st chemotherapy given today per orders. Patient tolerated it well without difficulty. Vitals stable and discharged home from clinic ambulatory with son. Follow up as scheduled.

## 2016-06-01 ENCOUNTER — Telehealth (HOSPITAL_COMMUNITY): Payer: Self-pay

## 2016-06-01 NOTE — Telephone Encounter (Signed)
24 hour follow up-blood sugar is better today per Daughter in law. No problems noted. Will come in for injection tomorrow.

## 2016-06-02 ENCOUNTER — Encounter (HOSPITAL_BASED_OUTPATIENT_CLINIC_OR_DEPARTMENT_OTHER): Payer: PPO

## 2016-06-02 ENCOUNTER — Encounter (HOSPITAL_COMMUNITY): Payer: Self-pay

## 2016-06-02 VITALS — BP 124/58 | HR 106 | Temp 98.4°F | Resp 16

## 2016-06-02 DIAGNOSIS — C50912 Malignant neoplasm of unspecified site of left female breast: Secondary | ICD-10-CM

## 2016-06-02 DIAGNOSIS — Z5189 Encounter for other specified aftercare: Secondary | ICD-10-CM | POA: Diagnosis not present

## 2016-06-02 DIAGNOSIS — E782 Mixed hyperlipidemia: Secondary | ICD-10-CM | POA: Diagnosis not present

## 2016-06-02 DIAGNOSIS — E039 Hypothyroidism, unspecified: Secondary | ICD-10-CM | POA: Diagnosis not present

## 2016-06-02 DIAGNOSIS — C50919 Malignant neoplasm of unspecified site of unspecified female breast: Secondary | ICD-10-CM | POA: Diagnosis not present

## 2016-06-02 DIAGNOSIS — G250 Essential tremor: Secondary | ICD-10-CM | POA: Diagnosis not present

## 2016-06-02 DIAGNOSIS — C773 Secondary and unspecified malignant neoplasm of axilla and upper limb lymph nodes: Secondary | ICD-10-CM | POA: Diagnosis not present

## 2016-06-02 DIAGNOSIS — K219 Gastro-esophageal reflux disease without esophagitis: Secondary | ICD-10-CM | POA: Diagnosis not present

## 2016-06-02 DIAGNOSIS — E1165 Type 2 diabetes mellitus with hyperglycemia: Secondary | ICD-10-CM | POA: Diagnosis not present

## 2016-06-02 MED ORDER — PEGFILGRASTIM INJECTION 6 MG/0.6ML ~~LOC~~
PREFILLED_SYRINGE | SUBCUTANEOUS | Status: AC
  Filled 2016-06-02: qty 0.6

## 2016-06-02 MED ORDER — PEGFILGRASTIM INJECTION 6 MG/0.6ML ~~LOC~~
6.0000 mg | PREFILLED_SYRINGE | Freq: Once | SUBCUTANEOUS | Status: AC
  Administered 2016-06-02: 6 mg via SUBCUTANEOUS

## 2016-06-02 NOTE — Progress Notes (Signed)
[name] presents today for injection per MD orders. Neulasta 6mg  administered SQ in right Abdomen. Administration without incident. Patient tolerated well.

## 2016-06-02 NOTE — Patient Instructions (Signed)
[place] at [facility] Discharge Instructions  RECOMMENDATIONS MADE BY THE CONSULTANT AND ANY TEST RESULTS WILL BE SENT TO YOUR REFERRING PHYSICIAN.  Neulasta  Thank you for choosing [place] at [facility] to provide your oncology and hematology care.  To afford each patient quality time with our provider, please arrive at least 15 minutes before your scheduled appointment time.    If you have a lab appointment with the [facility] please come in thru the  Main Entrance and check in at the main information desk  You need to re-schedule your appointment should you arrive 10 or more minutes late.  We strive to give you quality time with our providers, and arriving late affects you and other patients whose appointments are after yours.  Also, if you no show three or more times for appointments you may be dismissed from the clinic at the providers discretion.     Again, thank you for choosing [facility].  Our hope is that these requests will decrease the amount of time that you wait before being seen by our physicians.       _____________________________________________________________  Should you have questions after your visit to [facility], please contact our office at [phone] between the hours of 8:30 a.m. and 4:30 p.m.  Voicemails left after 4:30 p.m. will not be returned until the following business day.  For prescription refill requests, have your pharmacy contact our office.       Resources For Cancer Patients and their Caregivers ? American Cancer Society: Can assist with transportation, wigs, general needs, runs Look Good Feel Better.        318 064 1912 ? Cancer Care: Provides financial assistance, online support groups, medication/co-pay assistance.  1-800-813-HOPE 5414492532) ? [facility] Assists [place] cancer patients and their families through emotional ,  educational and financial support.  770 816 3716 ? [place] DSS Where to apply for food stamps, Medicaid and utility assistance. (272) 487-5194 ? RCATS: Transportation to medical appointments. 346-411-4501 ? Social Security Administration: May apply for disability if have a Stage IV cancer. 520-597-7476 (337) 346-8260 ? [employer], Disability and Transit Services: Assists with nutrition, care and transit needs. [facility] Support Programs: @10RELATIVEDAYS @ > Cancer Support Group  2nd Tuesday of the month 1pm-2pm, Journey Room  > Creative Journey  3rd Tuesday of the month 1130am-1pm, Journey Room  > Look Good Feel Better  1st Wednesday of the month 10am-12 noon, Journey Room (Call [place] to register [phone])

## 2016-06-07 ENCOUNTER — Encounter (HOSPITAL_COMMUNITY): Payer: Self-pay

## 2016-06-07 ENCOUNTER — Emergency Department (HOSPITAL_COMMUNITY): Payer: PPO

## 2016-06-07 ENCOUNTER — Inpatient Hospital Stay (HOSPITAL_COMMUNITY)
Admission: EM | Admit: 2016-06-07 | Discharge: 2016-06-14 | DRG: 872 | Disposition: A | Discharge status: Home / Self Care | Payer: PPO | Attending: Internal Medicine | Admitting: Internal Medicine

## 2016-06-07 ENCOUNTER — Encounter (HOSPITAL_COMMUNITY): Payer: PPO | Attending: Oncology | Admitting: Oncology

## 2016-06-07 ENCOUNTER — Encounter (HOSPITAL_COMMUNITY): Payer: PPO

## 2016-06-07 ENCOUNTER — Other Ambulatory Visit (HOSPITAL_COMMUNITY): Payer: Self-pay

## 2016-06-07 VITALS — BP 120/78 | HR 125 | Temp 99.0°F | Resp 18 | Wt 193.5 lb

## 2016-06-07 DIAGNOSIS — Z794 Long term (current) use of insulin: Secondary | ICD-10-CM | POA: Insufficient documentation

## 2016-06-07 DIAGNOSIS — Z9221 Personal history of antineoplastic chemotherapy: Secondary | ICD-10-CM

## 2016-06-07 DIAGNOSIS — J449 Chronic obstructive pulmonary disease, unspecified: Secondary | ICD-10-CM | POA: Insufficient documentation

## 2016-06-07 DIAGNOSIS — G4730 Sleep apnea, unspecified: Secondary | ICD-10-CM | POA: Insufficient documentation

## 2016-06-07 DIAGNOSIS — E038 Other specified hypothyroidism: Secondary | ICD-10-CM

## 2016-06-07 DIAGNOSIS — Z882 Allergy status to sulfonamides status: Secondary | ICD-10-CM | POA: Diagnosis not present

## 2016-06-07 DIAGNOSIS — Z9049 Acquired absence of other specified parts of digestive tract: Secondary | ICD-10-CM | POA: Diagnosis not present

## 2016-06-07 DIAGNOSIS — E119 Type 2 diabetes mellitus without complications: Secondary | ICD-10-CM | POA: Insufficient documentation

## 2016-06-07 DIAGNOSIS — C779 Secondary and unspecified malignant neoplasm of lymph node, unspecified: Secondary | ICD-10-CM | POA: Diagnosis not present

## 2016-06-07 DIAGNOSIS — N12 Tubulo-interstitial nephritis, not specified as acute or chronic: Secondary | ICD-10-CM | POA: Diagnosis present

## 2016-06-07 DIAGNOSIS — K5900 Constipation, unspecified: Secondary | ICD-10-CM | POA: Diagnosis present

## 2016-06-07 DIAGNOSIS — R509 Fever, unspecified: Secondary | ICD-10-CM | POA: Diagnosis not present

## 2016-06-07 DIAGNOSIS — E1122 Type 2 diabetes mellitus with diabetic chronic kidney disease: Secondary | ICD-10-CM | POA: Diagnosis not present

## 2016-06-07 DIAGNOSIS — A419 Sepsis, unspecified organism: Secondary | ICD-10-CM | POA: Diagnosis not present

## 2016-06-07 DIAGNOSIS — E785 Hyperlipidemia, unspecified: Secondary | ICD-10-CM | POA: Diagnosis present

## 2016-06-07 DIAGNOSIS — Z881 Allergy status to other antibiotic agents status: Secondary | ICD-10-CM | POA: Diagnosis not present

## 2016-06-07 DIAGNOSIS — C773 Secondary and unspecified malignant neoplasm of axilla and upper limb lymph nodes: Secondary | ICD-10-CM

## 2016-06-07 DIAGNOSIS — Z9889 Other specified postprocedural states: Secondary | ICD-10-CM | POA: Insufficient documentation

## 2016-06-07 DIAGNOSIS — C801 Malignant (primary) neoplasm, unspecified: Secondary | ICD-10-CM | POA: Diagnosis not present

## 2016-06-07 DIAGNOSIS — Z79899 Other long term (current) drug therapy: Secondary | ICD-10-CM | POA: Diagnosis not present

## 2016-06-07 DIAGNOSIS — E1165 Type 2 diabetes mellitus with hyperglycemia: Secondary | ICD-10-CM | POA: Diagnosis not present

## 2016-06-07 DIAGNOSIS — Z87891 Personal history of nicotine dependence: Secondary | ICD-10-CM | POA: Diagnosis not present

## 2016-06-07 DIAGNOSIS — E039 Hypothyroidism, unspecified: Secondary | ICD-10-CM | POA: Diagnosis present

## 2016-06-07 DIAGNOSIS — R197 Diarrhea, unspecified: Secondary | ICD-10-CM | POA: Diagnosis not present

## 2016-06-07 DIAGNOSIS — L8990 Pressure ulcer of unspecified site, unspecified stage: Secondary | ICD-10-CM | POA: Diagnosis not present

## 2016-06-07 DIAGNOSIS — Z888 Allergy status to other drugs, medicaments and biological substances status: Secondary | ICD-10-CM | POA: Insufficient documentation

## 2016-06-07 DIAGNOSIS — C50912 Malignant neoplasm of unspecified site of left female breast: Secondary | ICD-10-CM | POA: Diagnosis present

## 2016-06-07 DIAGNOSIS — G20 Parkinson's disease: Secondary | ICD-10-CM | POA: Diagnosis not present

## 2016-06-07 DIAGNOSIS — F419 Anxiety disorder, unspecified: Secondary | ICD-10-CM | POA: Insufficient documentation

## 2016-06-07 DIAGNOSIS — N183 Chronic kidney disease, stage 3 unspecified: Secondary | ICD-10-CM

## 2016-06-07 DIAGNOSIS — K219 Gastro-esophageal reflux disease without esophagitis: Secondary | ICD-10-CM | POA: Diagnosis not present

## 2016-06-07 DIAGNOSIS — G8929 Other chronic pain: Secondary | ICD-10-CM | POA: Diagnosis not present

## 2016-06-07 DIAGNOSIS — Z170 Estrogen receptor positive status [ER+]: Secondary | ICD-10-CM | POA: Diagnosis not present

## 2016-06-07 DIAGNOSIS — Z7982 Long term (current) use of aspirin: Secondary | ICD-10-CM

## 2016-06-07 DIAGNOSIS — Z9012 Acquired absence of left breast and nipple: Secondary | ICD-10-CM | POA: Diagnosis not present

## 2016-06-07 DIAGNOSIS — Z23 Encounter for immunization: Secondary | ICD-10-CM | POA: Diagnosis not present

## 2016-06-07 DIAGNOSIS — Z91040 Latex allergy status: Secondary | ICD-10-CM

## 2016-06-07 DIAGNOSIS — F329 Major depressive disorder, single episode, unspecified: Secondary | ICD-10-CM | POA: Diagnosis not present

## 2016-06-07 DIAGNOSIS — Z87442 Personal history of urinary calculi: Secondary | ICD-10-CM

## 2016-06-07 LAB — CBC WITH DIFFERENTIAL/PLATELET
BLASTS: 0 %
Band Neutrophils: 0 %
Basophils Absolute: 0.2 10*3/uL — ABNORMAL HIGH (ref 0.0–0.1)
Basophils Absolute: 0 10*3/uL (ref 0.0–0.1)
Basophils Relative: 2 %
Basophils Relative: 0 %
EOS PCT: 1 %
Eosinophils Absolute: 0.1 10*3/uL (ref 0.0–0.7)
Eosinophils Absolute: 0 10*3/uL (ref 0.0–0.7)
Eosinophils Relative: 0 %
HEMATOCRIT: 39.4 % (ref 36.0–46.0)
HEMATOCRIT: 38.6 % (ref 36.0–46.0)
HEMOGLOBIN: 13 g/dL (ref 12.0–15.0)
Hemoglobin: 12.9 g/dL (ref 12.0–15.0)
LYMPHS ABS: 7.9 10*3/uL — AB (ref 0.7–4.0)
Lymphocytes Relative: 48 %
Lymphocytes Relative: 48 %
Lymphs Abs: 4.1 10*3/uL — ABNORMAL HIGH (ref 0.7–4.0)
MCH: 27.9 pg (ref 26.0–34.0)
MCH: 28.3 pg (ref 26.0–34.0)
MCHC: 32.7 g/dL (ref 30.0–36.0)
MCHC: 33.7 g/dL (ref 30.0–36.0)
MCV: 85.1 fL (ref 78.0–100.0)
MCV: 83.9 fL (ref 78.0–100.0)
MONOS PCT: 24 %
MYELOCYTES: 0 %
Metamyelocytes Relative: 0 %
Monocytes Absolute: 2.1 10*3/uL — ABNORMAL HIGH (ref 0.1–1.0)
Monocytes Absolute: 2.5 10*3/uL — ABNORMAL HIGH (ref 0.1–1.0)
Monocytes Relative: 15 %
NEUTROS PCT: 25 %
NEUTROS PCT: 37 %
NRBC: 0 /100{WBCs}
Neutro Abs: 2.2 10*3/uL (ref 1.7–7.7)
Neutro Abs: 6.1 10*3/uL (ref 1.7–7.7)
Other: 0 %
PLATELETS: 185 10*3/uL (ref 150–400)
Platelets: 211 10*3/uL (ref 150–400)
Promyelocytes Absolute: 0 %
RBC: 4.63 MIL/uL (ref 3.87–5.11)
RBC: 4.6 MIL/uL (ref 3.87–5.11)
RDW: 14.6 % (ref 11.5–15.5)
RDW: 14.6 % (ref 11.5–15.5)
WBC: 8.7 10*3/uL (ref 4.0–10.5)
WBC: 16.5 10*3/uL — AB (ref 4.0–10.5)

## 2016-06-07 LAB — COMPREHENSIVE METABOLIC PANEL
ALBUMIN: 3.2 g/dL — AB (ref 3.5–5.0)
ALT: 27 U/L (ref 14–54)
ALT: 29 U/L (ref 14–54)
AST: 36 U/L (ref 15–41)
AST: 29 U/L (ref 15–41)
Albumin: 3.5 g/dL (ref 3.5–5.0)
Alkaline Phosphatase: 84 U/L (ref 38–126)
Alkaline Phosphatase: 87 U/L (ref 38–126)
Anion gap: 14 (ref 5–15)
Anion gap: 14 (ref 5–15)
BILIRUBIN TOTAL: 0.6 mg/dL (ref 0.3–1.2)
BILIRUBIN TOTAL: 0.6 mg/dL (ref 0.3–1.2)
BUN: 20 mg/dL (ref 6–20)
BUN: 22 mg/dL — ABNORMAL HIGH (ref 6–20)
CALCIUM: 8.6 mg/dL — AB (ref 8.9–10.3)
CHLORIDE: 98 mmol/L — AB (ref 101–111)
CO2: 18 mmol/L — AB (ref 22–32)
CO2: 20 mmol/L — ABNORMAL LOW (ref 22–32)
CREATININE: 1.26 mg/dL — AB (ref 0.44–1.00)
CREATININE: 1.33 mg/dL — AB (ref 0.44–1.00)
Calcium: 9.2 mg/dL (ref 8.9–10.3)
Chloride: 99 mmol/L — ABNORMAL LOW (ref 101–111)
GFR calc non Af Amer: 43 mL/min — ABNORMAL LOW (ref >60)
GFR, EST AFRICAN AMERICAN: 50 mL/min — AB (ref >60)
GFR, EST AFRICAN AMERICAN: 47 mL/min — AB (ref >60)
GFR, EST NON AFRICAN AMERICAN: 40 mL/min — AB (ref >60)
GLUCOSE: 490 mg/dL — AB (ref 65–99)
Glucose, Bld: 267 mg/dL — ABNORMAL HIGH (ref 65–99)
POTASSIUM: 3.7 mmol/L (ref 3.5–5.1)
Potassium: 3.9 mmol/L (ref 3.5–5.1)
Sodium: 131 mmol/L — ABNORMAL LOW (ref 135–145)
Sodium: 132 mmol/L — ABNORMAL LOW (ref 135–145)
TOTAL PROTEIN: 6.6 g/dL (ref 6.5–8.1)
Total Protein: 6.3 g/dL — ABNORMAL LOW (ref 6.5–8.1)

## 2016-06-07 LAB — URINALYSIS, ROUTINE W REFLEX MICROSCOPIC
Bilirubin Urine: NEGATIVE
KETONES UR: 5 mg/dL — AB
Nitrite: POSITIVE — AB
PH: 5 (ref 5.0–8.0)
PROTEIN: NEGATIVE mg/dL
Specific Gravity, Urine: 1.016 (ref 1.005–1.030)

## 2016-06-07 LAB — LACTIC ACID, PLASMA
Lactic Acid, Venous: 1.8 mmol/L (ref 0.5–1.9)
Lactic Acid, Venous: 1.7 mmol/L (ref 0.5–1.9)

## 2016-06-07 LAB — PROTIME-INR
INR: 1.05
Prothrombin Time: 13.7 seconds (ref 11.4–15.2)

## 2016-06-07 IMAGING — DX DG CHEST 2V
2 series · 2 of 2 positions shown · non-contrast
Comparison: [DATE] and [DATE]

CLINICAL DATA: Fever.  Cancer patient.

EXAM:
CHEST  2 VIEW

[chest pa]
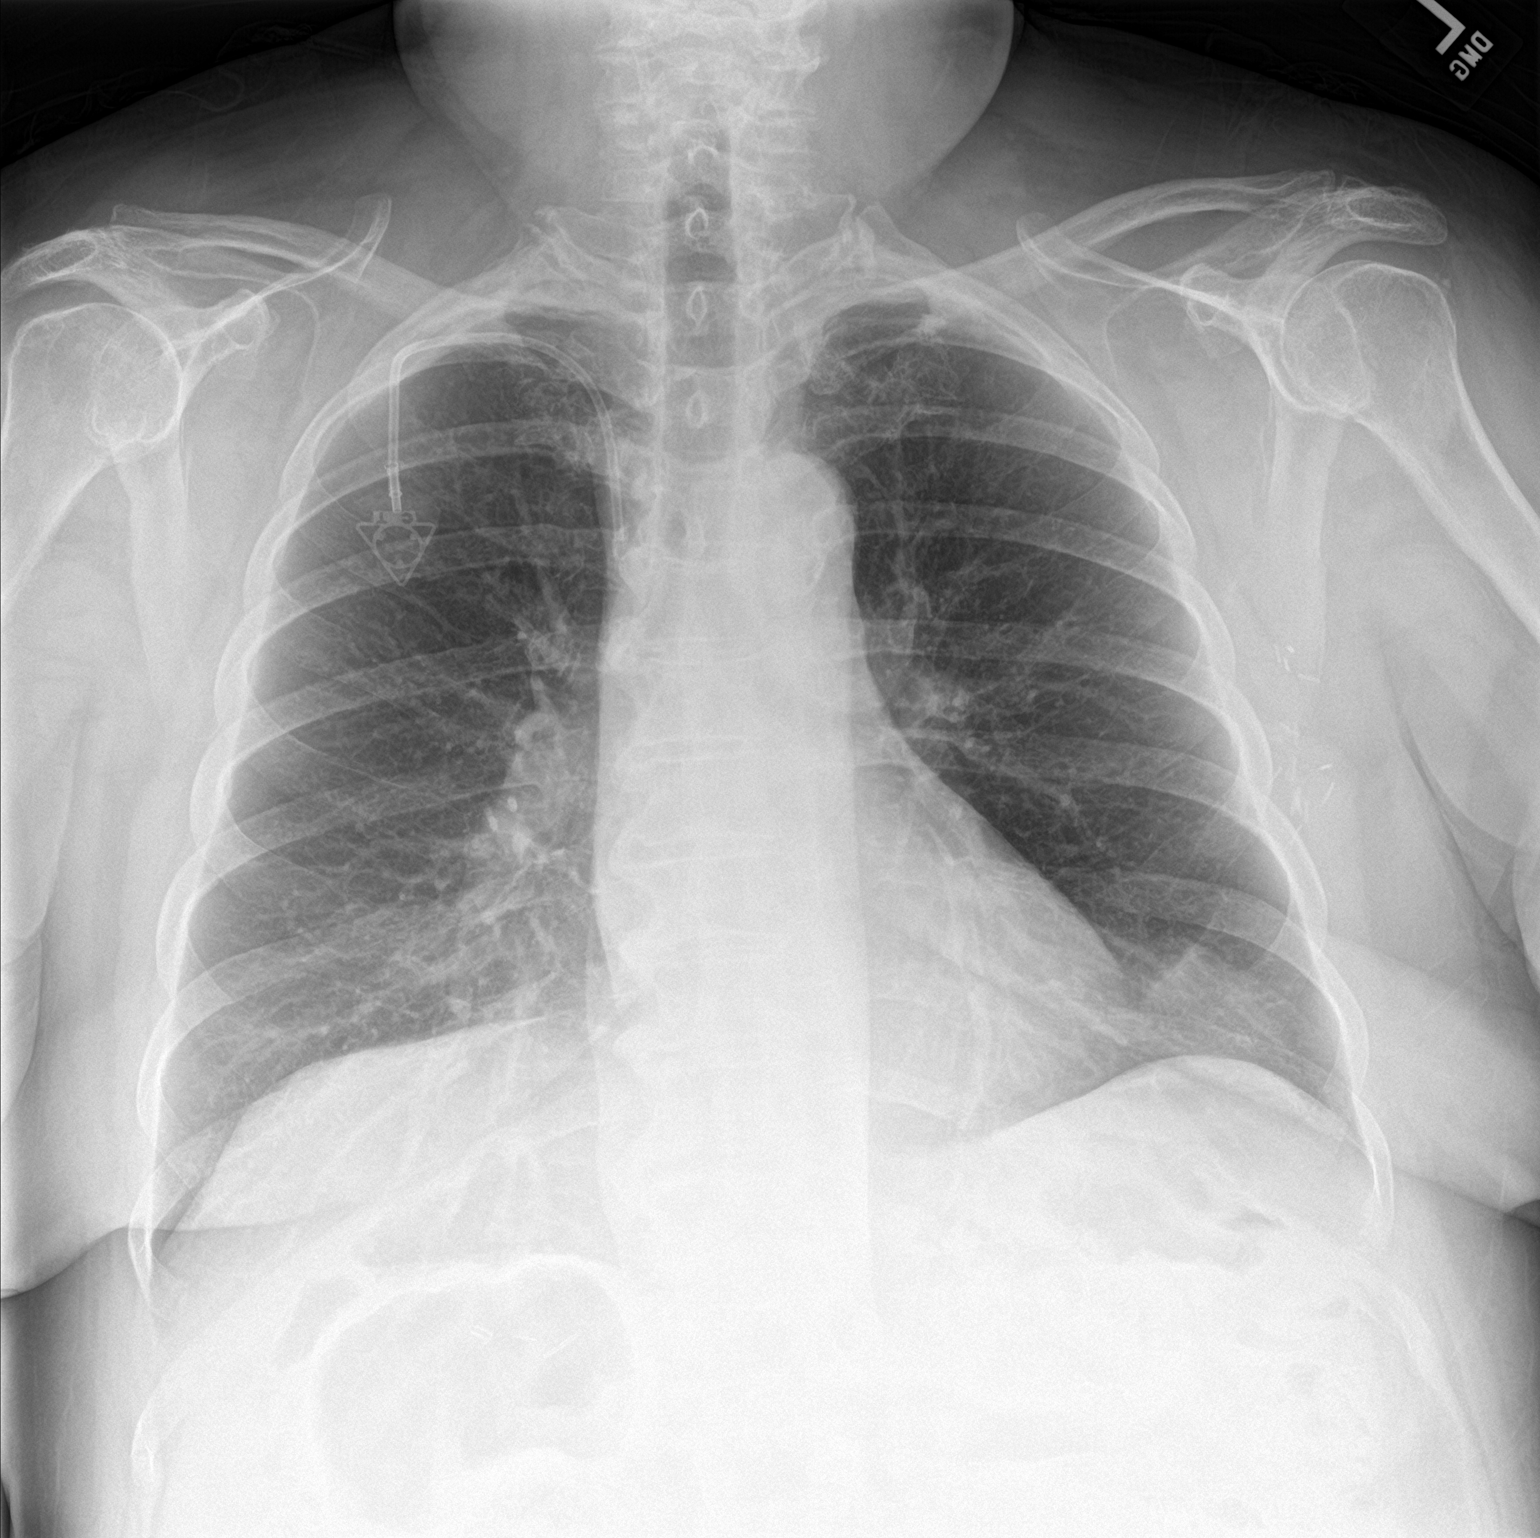

[chest lat]
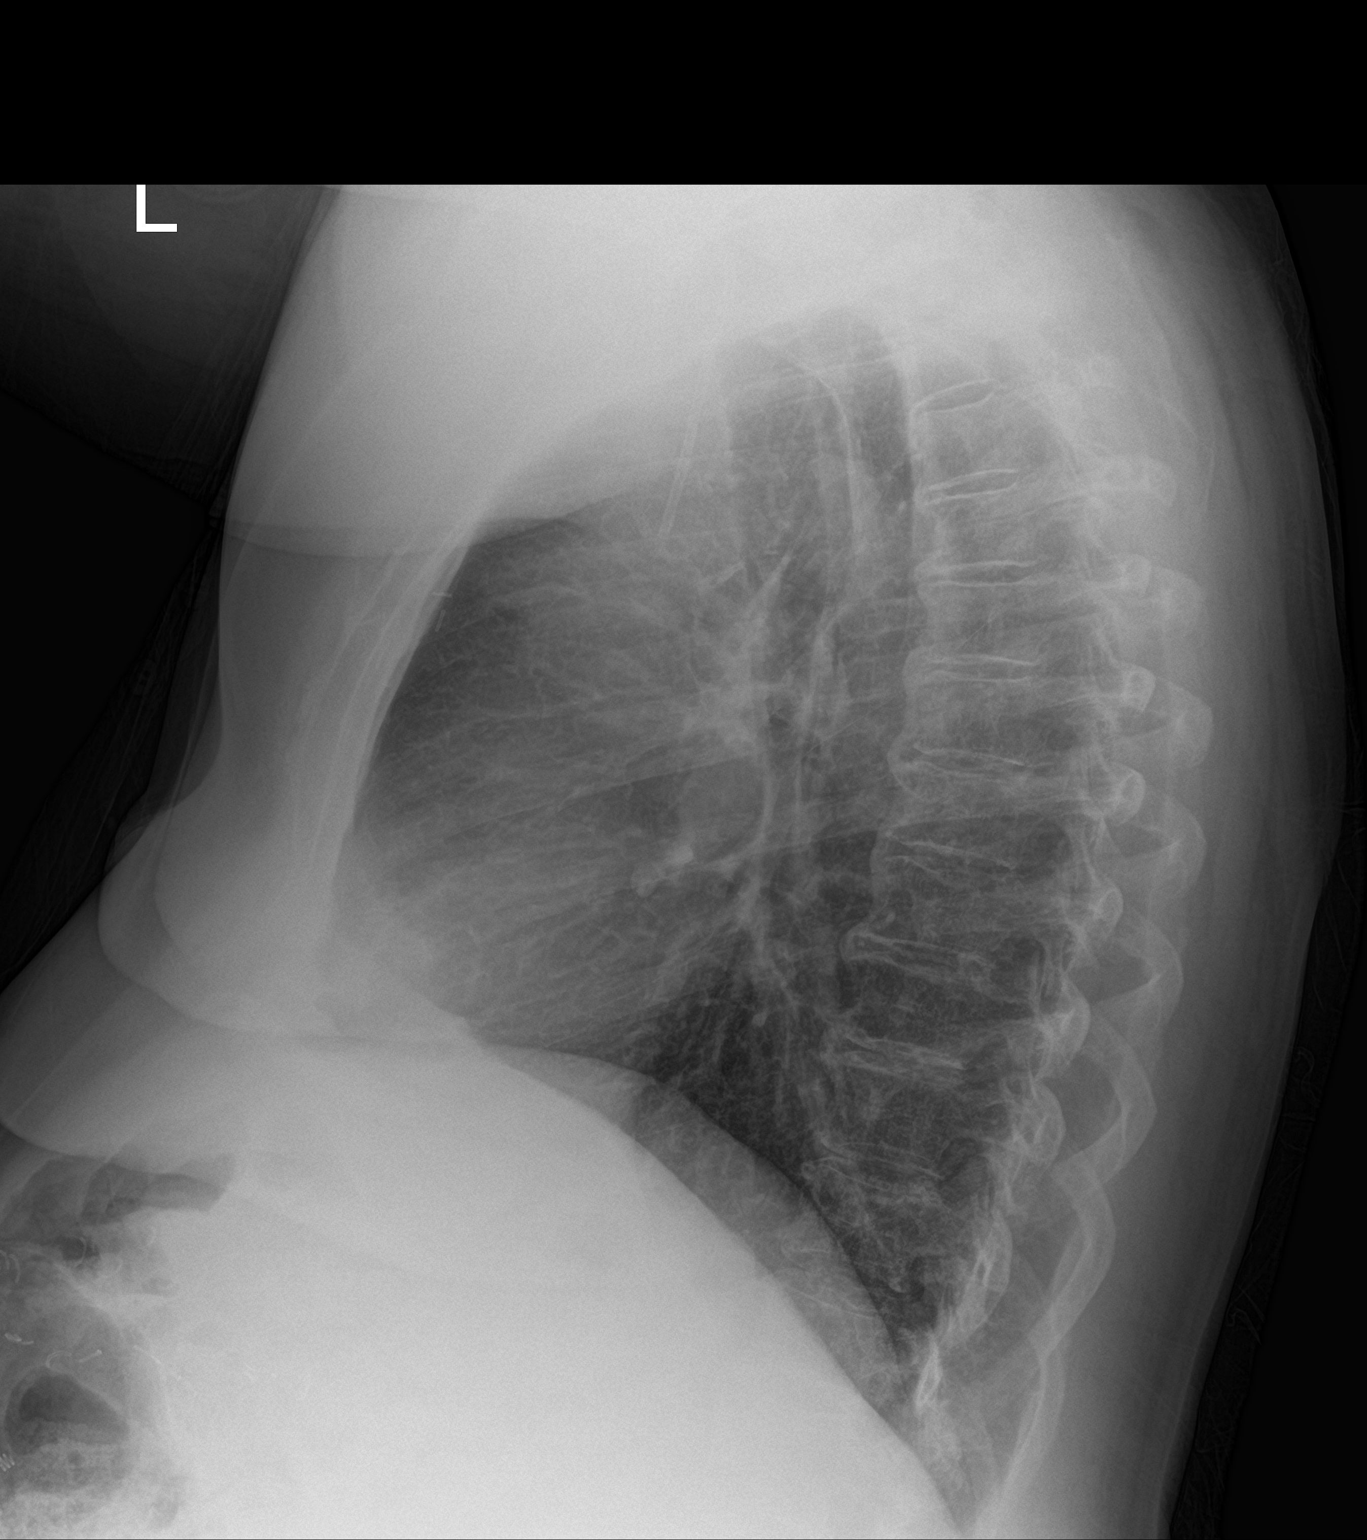

[2 of 2 positions shown; findings below may reference images not displayed]

FINDINGS: Power injectable Port-A-Cath tip:  Upper SVC.

Thoracic spondylosis. Left axillary clips in the setting of prior
left mastectomy.

Atherosclerotic calcification of the aortic arch.

No airspace opacity or pleural effusion. Slightly prominent
pericardial adipose tissue as on prior exams, particularly along the
cardiac apex.
IMPRESSION: 1. No pneumonia or thoracic cause for the patient's fever is
identified.
2.  Atherosclerotic calcification of the aortic arch.
3. Thoracic spondylosis.
4. Left mastectomy.

## 2016-06-07 MED ORDER — CIPROFLOXACIN IN D5W 400 MG/200ML IV SOLN
400.0000 mg | Freq: Once | INTRAVENOUS | Status: AC
  Administered 2016-06-07: 400 mg via INTRAVENOUS
  Filled 2016-06-07: qty 200

## 2016-06-07 MED ORDER — FENTANYL CITRATE (PF) 100 MCG/2ML IJ SOLN
50.0000 ug | Freq: Once | INTRAMUSCULAR | Status: AC
  Administered 2016-06-07: 50 ug via INTRAVENOUS
  Filled 2016-06-07: qty 2

## 2016-06-07 MED ORDER — SODIUM CHLORIDE 0.9 % IV BOLUS (SEPSIS)
500.0000 mL | Freq: Once | INTRAVENOUS | Status: AC
  Administered 2016-06-07: 500 mL via INTRAVENOUS

## 2016-06-07 NOTE — Patient Instructions (Signed)
[place] at [facility] Discharge Instructions  RECOMMENDATIONS MADE BY THE CONSULTANT AND ANY TEST RESULTS WILL BE SENT TO YOUR REFERRING PHYSICIAN.  You were seen today by Dr. Twana First Follow up as scheduled on 4/16 See Amy up front for appointments   Thank you for choosing [place] at [facility] to provide your oncology and hematology care.  To afford each patient quality time with our provider, please arrive at least 15 minutes before your scheduled appointment time.    If you have a lab appointment with the [facility] please come in thru the  Main Entrance and check in at the main information desk  You need to re-schedule your appointment should you arrive 10 or more minutes late.  We strive to give you quality time with our providers, and arriving late affects you and other patients whose appointments are after yours.  Also, if you no show three or more times for appointments you may be dismissed from the clinic at the providers discretion.     Again, thank you for choosing [facility].  Our hope is that these requests will decrease the amount of time that you wait before being seen by our physicians.       _____________________________________________________________  Should you have questions after your visit to [facility], please contact our office at [phone] between the hours of 8:30 a.m. and 4:30 p.m.  Voicemails left after 4:30 p.m. will not be returned until the following business day.  For prescription refill requests, have your pharmacy contact our office.       Resources For Cancer Patients and their Caregivers ? American Cancer Society: Can assist with transportation, wigs, general needs, runs Look Good Feel Better.        646-043-9980 ? Cancer Care: Provides financial assistance, online support groups, medication/co-pay assistance.  1-800-813-HOPE 201-079-0609) ? [facility] Assists [place] cancer patients and their families through emotional , educational and financial support.  602 431 4027 ? [place] DSS Where to apply for food stamps, Medicaid and utility assistance. 502-776-2424 ? RCATS: Transportation to medical appointments. 908-178-9339 ? Social Security Administration: May apply for disability if have a Stage IV cancer. (406) 113-1885 (779) 360-2401 ? [employer], Disability and Transit Services: Assists with nutrition, care and transit needs. [place] Support Programs: @10RELATIVEDAYS @ > Cancer Support Group  2nd Tuesday of the month 1pm-2pm, Journey Room  > Creative Journey  3rd Tuesday of the month 1130am-1pm, Journey Room  > Look Good Feel Better  1st Wednesday of the month 10am-12 noon, Journey Room (Call [place] to register [phone])

## 2016-06-07 NOTE — ED Notes (Signed)
Patient states that she wants to be admitted to the hospital.

## 2016-06-07 NOTE — ED Triage Notes (Signed)
Patient was seen in the cancer center today for a follow up for the treatment on the 26th.  Patient started running a fever of 101 when she returned home.  Took 3 - 200 mg ibuprofen around 1600 this afternoon.  Patient states that she is having diarrhea and lower back pain.

## 2016-06-07 NOTE — Progress Notes (Signed)
DIAGNOSIS: Cancer Staging Breast cancer metastasized to axillary lymph node, left (HCC) Staging form: Breast, AJCC 8th Edition - Clinical: Stage IIA (cT2, cN1, cM0, G2, ER: Positive, PR: Positive, HER2: Negative) - Signed by Twana First, MD on 05/07/2016 - Pathologic: No stage assigned - Unsigned   SUMMARY OF ONCOLOGIC HISTORY:   Breast cancer metastasized to axillary lymph node, left ([place])   04/14/2016 Initial Diagnosis    Breast cancer metastasized to axillary lymph node, left ([place])     04/14/2016 Surgery    Left modified radical mastectomy - INVASIVE GRADE 2 DUCTAL CARCINOMA, SPANNING 2.2 CM IN GREATEST DIMENSION. - ASSOCIATED INTERMEDIATE GRADE DUCTAL CARCINOMA IN SITU. - ANTERIOR MARGIN IS BROADLY POSITIVE FOR INVASIVE DUCTAL CARCINOMA IN AREA OF TUMOR (UPPER INNER QUADRANT). - OTHER MARGINS ARE NEGATIVE. - ONE OF SIX LYMPH NODES POSITIVE FOR METASTATIC DUCTAL CARCINOMA (1/6). - ER/PR positive, HER2 negative. - pT2 pN1a cM0      05/14/2016 Imaging    CT chest/abd/pelvis: IMPRESSION: 1. Left mastectomy changes with a seroma. Rounded regions of low attenuation in the axilla adjacent to surgical clips are favored to be postsurgical changes rather than mildly prominent lymph nodes. 2. The left adrenal gland is mildly thickened, likely due to hyperplasia. A tiny 6 mm nodule is likely a small adenoma. 3. Atherosclerotic changes in a non aneurysmal aorta. 4. Anterior hernia mesh is calcified. Fluid just deep to the hernia mesh is likely not acute and of doubtful acute significance.      05/26/2016 Procedure    Port-A-Cath insertion, needle aspiration of wound seroma by Dr. Arnoldo Morale       CURRENT THERAPY:  INTERVAL HISTORY: Margaret Mcdaniel 66 y.o. female returns for follow up. She is accompanied by family today. She denies side effects from cycle 1 of TC, other than increased fatigue. She reports some lower back and flank pain, which may be a UTI. The patient's family reports she  has had very high blood sugar recently, and they question if this could be the reason for the patient's discomfort. She is getting her insulin adjusted in the next few days. The patient reports some diarrhea.    is allergic to keflex [cephalexin]; sulfa antibiotics; and latex.    MEDICAL HISTORY: Past Medical History:  Diagnosis Date  . Anxiety   . Arthritis   . Breast cancer metastasized to axillary lymph node, left ([place]) 04/14/2016  . Cancer ([place])    left breast  . COPD (chronic obstructive pulmonary disease) ([place])   . Diabetes mellitus without complication ([place])   . GERD (gastroesophageal reflux disease)   . History of kidney stones   . Hypothyroidism   . Mental disorder   . Neuromuscular disorder ([place])   . PONV (postoperative nausea and vomiting)   . Sleep apnea    Uses CPAP    SURGICAL HISTORY: Past Surgical History:  Procedure Laterality Date  . ANKLE SURGERY Right    fusion  . APPENDECTOMY    . CHOLECYSTECTOMY    . EXPLORATORY LAPAROTOMY    . HEMORRHOID SURGERY    . MASTECTOMY MODIFIED RADICAL Left 04/14/2016   Procedure: LEFT MODIFIED RADICAL MASTECTOMY;  Surgeon: Aviva Signs, MD;  Location: [facility];  Service: General;  Laterality: Left;  . PORTACATH PLACEMENT N/A 05/26/2016   Procedure: INSERTION PORT-A-CATH RIGHT SUBCLAVIAN AND  DRAINAGE OF SEROMA LEFT CHEST;  Surgeon: Aviva Signs, MD;  Location: [facility];  Service: General;  Laterality: N/A;  . ROTATOR CUFF REPAIR    . TONSILLECTOMY    .  TUBAL LIGATION      SOCIAL HISTORY: Social History   Social History  . Marital status: Divorced    Spouse name: N/A  . Number of children: N/A  . Years of education: N/A   Occupational History  . Not on file.   Social History Main Topics  . Smoking status: Former Smoker    Packs/day: 1.00    Years: 50.00    Types: Cigarettes    Quit date: 04/12/2014  . Smokeless tobacco: Never Used  . Alcohol use No  . Drug use: No  . Sexual activity: Not on file   Other Topics  Concern  . Not on file   Social History Narrative  . No narrative on file    FAMILY HISTORY: History reviewed. No pertinent family history.  Review of Systems  Constitutional: Positive for malaise/fatigue (She reports increased fatigue since beginning chemotherapy.).  HENT: Negative.   Eyes: Negative.   Respiratory: Negative.  Negative for cough, shortness of breath and wheezing.   Cardiovascular: Negative.  Negative for chest pain.  Gastrointestinal: Positive for diarrhea. Negative for abdominal pain, nausea and vomiting.  Genitourinary: Positive for flank pain.  Musculoskeletal: Positive for back pain.       Denies pain to surgical site  Skin: Negative.   Neurological: Negative.   Endo/Heme/Allergies: Negative.   Psychiatric/Behavioral: Negative.   All other systems reviewed and are negative.   PHYSICAL EXAMINATION  ECOG PERFORMANCE STATUS: 1 - Symptomatic but completely ambulatory Vitals:   06/07/16 1017  BP: 120/78  Pulse: (!) 125  Resp: 18  Temp: 99 F (37.2 C)    Physical Exam  Constitutional: She is oriented to person, place, and time and well-developed, well-nourished, and in no distress.  HENT:  Head: Normocephalic and atraumatic.  Eyes: Conjunctivae and EOM are normal. Pupils are equal, round, and reactive to light.  Neck: Normal range of motion. Neck supple.  Cardiovascular: Normal rate, regular rhythm and normal heart sounds.   Pulmonary/Chest: Effort normal and breath sounds normal. No respiratory distress. She has no wheezes. She has no rales.  Abdominal: Soft. Bowel sounds are normal. She exhibits no distension and no mass. There is no tenderness. There is no rebound and no guarding.  Musculoskeletal: Normal range of motion.  Neurological: She is alert and oriented to person, place, and time. Gait normal.  Skin: Skin is warm and dry.  Nursing note and vitals reviewed.   LABORATORY DATA:  CBC    Component Value Date/Time   WBC 8.7 06/07/2016  0934   RBC 4.63 06/07/2016 0934   HGB 12.9 06/07/2016 0934   HCT 39.4 06/07/2016 0934   PLT 185 06/07/2016 0934   MCV 85.1 06/07/2016 0934   MCH 27.9 06/07/2016 0934   MCHC 32.7 06/07/2016 0934   RDW 14.6 06/07/2016 0934   LYMPHSABS 4.1 (H) 06/07/2016 0934   MONOABS 2.1 (H) 06/07/2016 0934   EOSABS 0.1 06/07/2016 0934   BASOSABS 0.2 (H) 06/07/2016 0934    CMP     Component Value Date/Time   NA 131 (L) 06/07/2016 0934   K 3.9 06/07/2016 0934   CL 99 (L) 06/07/2016 0934   CO2 18 (L) 06/07/2016 0934   GLUCOSE 490 (H) 06/07/2016 0934   BUN 20 06/07/2016 0934   CREATININE 1.26 (H) 06/07/2016 0934   CALCIUM 8.6 (L) 06/07/2016 0934   PROT 6.3 (L) 06/07/2016 0934   ALBUMIN 3.2 (L) 06/07/2016 0934   AST 36 06/07/2016 0934   ALT 27 06/07/2016 0934  ALKPHOS 84 06/07/2016 0934   BILITOT 0.6 06/07/2016 0934   GFRNONAA 43 (L) 06/07/2016 0934   GFRAA 50 (L) 06/07/2016 0934      ASSESSMENT and THERAPY PLAN:  Breast cancer metastasized to axillary lymph node, left (HCC) Staging form: Breast, AJCC 8th Edition - Clinical: Stage IIA (cT2, cN1, cM0, G2, ER: Positive, PR: Positive, HER2: Negative) - Signed by Twana First, MD on 05/07/2016 - Pathologic: No stage assigned - Unsigned  PLAN: Tolerating chemotherapy well. Continue with chemotherapy. Patient will return for cycle 2 TC on 06/21/16. Plan for 4 cycles of TC. Following adjuvant chemotherapy she will proceed with adjuvant radiation, which will then be followed by endocrine therapy for 5 years. RTC for follow up on 06/21/16.  All questions were answered. The patient knows to call the clinic with any problems, questions or concerns. We can certainly see the patient much sooner if necessary.  This note was electronically signed.    This document serves as a record of services personally performed by Twana First, MD. It was created on her behalf by Maryla Morrow, a trained medical scribe. The creation of this record is based on the  scribe's personal observations and the provider's statements to them. This document has been checked and approved by the attending provider.  Twana First, MD 06/07/2016

## 2016-06-07 NOTE — ED Provider Notes (Signed)
[facility] DEPT Provider Note   CSN: [id number] Arrival date & time: 06/07/16  1826  By signing my name below, I, Margit Banda, attest that this documentation has been prepared under the direction and in the presence of Daleen Bo, MD. Electronically Signed: Margit Banda, ED Scribe. 06/07/16. 8:58 PM.   History   Chief Complaint Chief Complaint  Patient presents with  . Fever    Chemo Patient    HPI Margaret Mcdaniel is a 66 y.o. female with a PMHx of Breast CA, DM and Parkinson's who presents to the Emergency Department complaining of a gradually worsening fever that started earlier today. Pt reports seeing her Oncologist this morning for a routine check up after receiving chemo infusion (05/31/16) and her temperature was 99.9 degrees. Later in the day pt started feeling worse, and her temperature was taken at home (101). Associated sx include lower back pain, and continuous diarrhea. Per relative, she took 3 ibuprofen ~4:30 pm for sx, with mild relief. Pt denies CP, cough, loss of appetite, nausea and vomiting.     The history is provided by the patient and a relative. No language interpreter was used.    Past Medical History:  Diagnosis Date  . Anxiety   . Arthritis   . Breast cancer metastasized to axillary lymph node, left ([place]) 04/14/2016  . Cancer ([place])    left breast  . COPD (chronic obstructive pulmonary disease) ([place])   . Diabetes mellitus without complication ([place])   . GERD (gastroesophageal reflux disease)   . History of kidney stones   . Hypothyroidism   . Mental disorder   . Neuromuscular disorder ([place])   . PONV (postoperative nausea and vomiting)   . Sleep apnea    Uses CPAP    Patient Active Problem List   Diagnosis Date Noted  . Malignant neoplasm of left female breast ([place])   . Breast cancer metastasized to axillary lymph node, left ([place]) 04/14/2016    Past Surgical History:  Procedure Laterality Date  . ANKLE SURGERY Right    fusion  .  APPENDECTOMY    . CHOLECYSTECTOMY    . EXPLORATORY LAPAROTOMY    . HEMORRHOID SURGERY    . MASTECTOMY MODIFIED RADICAL Left 04/14/2016   Procedure: LEFT MODIFIED RADICAL MASTECTOMY;  Surgeon: Aviva Signs, MD;  Location: [facility];  Service: General;  Laterality: Left;  . PORTACATH PLACEMENT N/A 05/26/2016   Procedure: INSERTION PORT-A-CATH RIGHT SUBCLAVIAN AND  DRAINAGE OF SEROMA LEFT CHEST;  Surgeon: Aviva Signs, MD;  Location: [facility];  Service: General;  Laterality: N/A;  . ROTATOR CUFF REPAIR    . TONSILLECTOMY    . TUBAL LIGATION      OB History    No data available       Home Medications    Prior to Admission medications   Medication Sig Start Date End Date Taking? Authorizing Provider  allopurinol (ZYLOPRIM) 100 MG tablet Take 100 mg by mouth daily.    Historical Provider, MD  aspirin EC 81 MG tablet Take 81 mg by mouth daily.    Historical Provider, MD  chlorproMAZINE (THORAZINE) 50 MG tablet Take 100 mg by mouth at bedtime.     Historical Provider, MD  CYCLOPHOSPHAMIDE IV Inject into the vein. Every 3 weeks    Historical Provider, MD  dexamethasone (DECADRON) 4 MG tablet Take 2 tablets (8 mg total) by mouth 2 (two) times daily. Start the day before Taxotere. Then again the day after chemo for 3 days. 05/17/16  Twana First, MD  DOCEtaxel (TAXOTERE IV) Inject into the vein. Every 3 weeks    Historical Provider, MD  HYDROcodone-acetaminophen (NORCO) 5-325 MG tablet Take 1 tablet by mouth every 6 (six) hours as needed for moderate pain. 04/16/16   Aviva Signs, MD  Insulin Degludec (TRESIBA FLEXTOUCH [place]) Inject 30 Units into the skin at bedtime.    Historical Provider, MD  insulin lispro protamine-lispro (HUMALOG MIX 75/25) (75-25) 100 UNIT/ML SUSP injection Inject 25 Units into the skin 2 (two) times daily with a meal.    Historical Provider, MD  levothyroxine (SYNTHROID, LEVOTHROID) 50 MCG tablet Take 50 mcg by mouth daily before breakfast.    Historical Provider, MD    lidocaine-prilocaine (EMLA) cream Apply to affected area once 05/17/16   Twana First, MD  LORazepam (ATIVAN) 0.5 MG tablet Take 1 tablet (0.5 mg total) by mouth every 6 (six) hours as needed (Nausea or vomiting). 05/17/16   Twana First, MD  mirtazapine (REMERON) 15 MG tablet Take 15 mg by mouth at bedtime.    Historical Provider, MD  omeprazole (PRILOSEC) 40 MG capsule Take 40 mg by mouth daily.    Historical Provider, MD  ondansetron (ZOFRAN) 8 MG tablet Take 1 tablet (8 mg total) by mouth 2 (two) times daily as needed for refractory nausea / vomiting. Start on day 3 after chemo. 05/17/16   Twana First, MD  Pegfilgrastim (NEULASTA ONPRO [place]) Inject into the skin. Every 21 days    Historical Provider, MD  polyethylene glycol powder (GLYCOLAX/MIRALAX) powder Take 17 g by mouth daily.  04/03/16   Historical Provider, MD  prochlorperazine (COMPAZINE) 10 MG tablet Take 1 tablet (10 mg total) by mouth every 6 (six) hours as needed (Nausea or vomiting). 05/17/16   Twana First, MD  simvastatin (ZOCOR) 40 MG tablet Take 40 mg by mouth at bedtime.    Historical Provider, MD  temazepam (RESTORIL) 15 MG capsule Take 15 mg by mouth at bedtime.    Historical Provider, MD  tiZANidine (ZANAFLEX) 4 MG tablet Take 8 mg by mouth 2 (two) times daily.    Historical Provider, MD    Family History History reviewed. No pertinent family history.  Social History Social History  Substance Use Topics  . Smoking status: Former Smoker    Packs/day: 1.00    Years: 50.00    Types: Cigarettes    Quit date: 04/12/2014  . Smokeless tobacco: Never Used  . Alcohol use No     Allergies   Keflex [cephalexin]; Sulfa antibiotics; and Latex   Review of Systems Review of Systems  All other systems reviewed and are negative.    Physical Exam Updated Vital Signs BP (!) 153/90 (BP Location: Right Wrist)   Pulse (!) 114   Temp 99.8 F (37.7 C) (Oral)   Resp 16   Ht 5\' 4"  (1.626 m)   Wt 193 lb 8 oz (87.8 kg)   SpO2 95%    BMI 33.21 kg/m   Physical Exam  Constitutional: She is oriented to person, place, and time. She appears well-developed and well-nourished.  HENT:  Head: Normocephalic and atraumatic.  Right Ear: External ear normal.  Left Ear: External ear normal.  Eyes: Conjunctivae and EOM are normal. Pupils are equal, round, and reactive to light.  Neck: Normal range of motion and phonation normal. Neck supple.  Cardiovascular: Normal rate, regular rhythm and normal heart sounds.   Pulmonary/Chest: Effort normal and breath sounds normal. She exhibits no bony tenderness.  Abdominal: Soft. There  is no tenderness.  Musculoskeletal: Normal range of motion.  Neurological: She is alert and oriented to person, place, and time. No cranial nerve deficit or sensory deficit. She exhibits normal muscle tone. Coordination normal.  Mild bilateral intention tremor left greater than right  Skin: Skin is warm, dry and intact.  Psychiatric: She has a normal mood and affect. Her behavior is normal. Judgment and thought content normal.  Nursing note and vitals reviewed.    ED Treatments / Results  DIAGNOSTIC STUDIES: Oxygen Saturation is 95% on RA, adequate by my interpretation.   COORDINATION OF CARE: 8:35 PM-Discussed next steps with pt. Pt verbalized understanding and is agreeable with the plan.    Labs (all labs ordered are listed, but only abnormal results are displayed) Labs Reviewed  COMPREHENSIVE METABOLIC PANEL - Abnormal; Notable for the following:       Result Value   Sodium 132 (*)    Chloride 98 (*)    CO2 20 (*)    Glucose, Bld 267 (*)    BUN 22 (*)    Creatinine, Ser 1.33 (*)    GFR calc non Af Amer 40 (*)    GFR calc Af Amer 47 (*)    All other components within normal limits  CBC WITH DIFFERENTIAL/PLATELET - Abnormal; Notable for the following:    WBC 16.5 (*)    All other components within normal limits  CULTURE, BLOOD (ROUTINE X 2)  CULTURE, BLOOD (ROUTINE X 2)  PROTIME-INR   LACTIC ACID, PLASMA  URINALYSIS, ROUTINE W REFLEX MICROSCOPIC  LACTIC ACID, PLASMA    EKG  EKG Interpretation None       Radiology Dg Chest 2 View  Result Date: 06/07/2016 CLINICAL DATA:  Fever.  Cancer patient. EXAM: CHEST  2 VIEW COMPARISON:  05/26/2016 and 05/14/2016 FINDINGS: Power injectable Port-A-Cath tip:  Upper SVC. Thoracic spondylosis. Left axillary clips in the setting of prior left mastectomy. Atherosclerotic calcification of the aortic arch. No airspace opacity or pleural effusion. Slightly prominent pericardial adipose tissue as on prior exams, particularly along the cardiac apex. IMPRESSION: 1. No pneumonia or thoracic cause for the patient's fever is identified. 2.  Atherosclerotic calcification of the aortic arch. 3. Thoracic spondylosis. 4. Left mastectomy. Electronically Signed   By: Van Clines M.D.   On: 06/07/2016 20:01    Procedures Procedures (including critical care time)  Medications Ordered in ED Medications  sodium chloride 0.9 % bolus 500 mL (not administered)     Initial Impression / Assessment and Plan / ED Course  I have reviewed the triage vital signs and the nursing notes.  Pertinent labs & imaging results that were available during my care of the patient were reviewed by me and considered in my medical decision making (see chart for details).  Clinical Course as of Apr 03 0002  Mon Jun 07, 2016  2358 High Glucose: (!) 267 [EW]  2359 High Creatinine: (!) 1.33 [EW]  2359 High WBC: (!) 16.5 [EW]    Clinical Course User Index [EW] Daleen Bo, MD    Medications  ciprofloxacin (CIPRO) IVPB 400 mg (400 mg Intravenous New Bag/Given 06/07/16 2306)  sodium chloride 0.9 % bolus 500 mL (0 mLs Intravenous Stopped 06/07/16 2200)  fentaNYL (SUBLIMAZE) injection 50 mcg (50 mcg Intravenous Given 06/07/16 2335)    Patient Vitals for the past 24 hrs:  BP Temp Temp src Pulse Resp SpO2 Height Weight  06/07/16 2330 (!) 154/82 - - 95 18 97 % - -  06/07/16 2300 (!) 143/94 - - 99 18 96 % - -  06/07/16 2245 - - - 100 15 95 % - -  06/07/16 2230 (!) 143/93 - - (!) 103 15 97 % - -  06/07/16 2145 - - - (!) 107 (!) 25 94 % - -  06/07/16 2130 - - - (!) 109 19 97 % - -  06/07/16 2030 (!) 150/99 - - (!) 102 13 97 % - -  06/07/16 1928 - - - - - - 5\' 4"  (1.626 m) 193 lb 8 oz (87.8 kg)  06/07/16 1925 (!) 153/90 99.8 F (37.7 C) Oral (!) 114 16 95 % - -    11:58 PM Reevaluation with update and discussion. After initial assessment and treatment, an updated evaluation reveals at this time she remains uncomfortable and feels like she is unable to go home. Konnor Vondrasek L   12:01 AM-Consult complete with hospitalist. Patient case explained and discussed.  He agrees to admit patient for further evaluation and treatment. Call ended at 00:05  Final Clinical Impressions(s) / ED Diagnoses   Final diagnoses:  Pyelonephritis    Evaluation consistent with pyelonephritis, patient being treated with chemotherapy, for breast cancer.  Patient is an insulin-dependent diabetic, without current DKA.  Mild hyperglycemia is present.  Nursing Notes Reviewed/ Care Coordinated Applicable Imaging Reviewed Interpretation of Laboratory Data incorporated into ED treatment  New Prescriptions New Prescriptions   No medications on file   I personally performed the services described in this documentation, which was scribed in my presence. The recorded information has been reviewed and is accurate.    Daleen Bo, MD 06/08/16 [phone]

## 2016-06-07 NOTE — ED Notes (Signed)
Notified Dr. Eulis Foster of possible sepsis patient.  Notified lab tech of patient's location at this time.

## 2016-06-08 ENCOUNTER — Observation Stay (HOSPITAL_COMMUNITY): Payer: PPO

## 2016-06-08 ENCOUNTER — Encounter (HOSPITAL_COMMUNITY): Payer: Self-pay

## 2016-06-08 DIAGNOSIS — K219 Gastro-esophageal reflux disease without esophagitis: Secondary | ICD-10-CM | POA: Diagnosis present

## 2016-06-08 DIAGNOSIS — F419 Anxiety disorder, unspecified: Secondary | ICD-10-CM | POA: Diagnosis present

## 2016-06-08 DIAGNOSIS — G20 Parkinson's disease: Secondary | ICD-10-CM | POA: Diagnosis present

## 2016-06-08 DIAGNOSIS — Z23 Encounter for immunization: Secondary | ICD-10-CM | POA: Diagnosis not present

## 2016-06-08 DIAGNOSIS — E1165 Type 2 diabetes mellitus with hyperglycemia: Secondary | ICD-10-CM | POA: Diagnosis not present

## 2016-06-08 DIAGNOSIS — Z794 Long term (current) use of insulin: Secondary | ICD-10-CM

## 2016-06-08 DIAGNOSIS — L8990 Pressure ulcer of unspecified site, unspecified stage: Secondary | ICD-10-CM | POA: Diagnosis not present

## 2016-06-08 DIAGNOSIS — Z882 Allergy status to sulfonamides status: Secondary | ICD-10-CM | POA: Diagnosis not present

## 2016-06-08 DIAGNOSIS — A419 Sepsis, unspecified organism: Secondary | ICD-10-CM | POA: Diagnosis not present

## 2016-06-08 DIAGNOSIS — G4730 Sleep apnea, unspecified: Secondary | ICD-10-CM | POA: Diagnosis present

## 2016-06-08 DIAGNOSIS — N12 Tubulo-interstitial nephritis, not specified as acute or chronic: Secondary | ICD-10-CM | POA: Diagnosis present

## 2016-06-08 DIAGNOSIS — C773 Secondary and unspecified malignant neoplasm of axilla and upper limb lymph nodes: Secondary | ICD-10-CM

## 2016-06-08 DIAGNOSIS — C779 Secondary and unspecified malignant neoplasm of lymph node, unspecified: Secondary | ICD-10-CM | POA: Diagnosis present

## 2016-06-08 DIAGNOSIS — N183 Chronic kidney disease, stage 3 unspecified: Secondary | ICD-10-CM

## 2016-06-08 DIAGNOSIS — C50912 Malignant neoplasm of unspecified site of left female breast: Secondary | ICD-10-CM | POA: Diagnosis not present

## 2016-06-08 DIAGNOSIS — E039 Hypothyroidism, unspecified: Secondary | ICD-10-CM | POA: Diagnosis present

## 2016-06-08 DIAGNOSIS — K5900 Constipation, unspecified: Secondary | ICD-10-CM | POA: Diagnosis present

## 2016-06-08 DIAGNOSIS — Z9221 Personal history of antineoplastic chemotherapy: Secondary | ICD-10-CM | POA: Diagnosis not present

## 2016-06-08 DIAGNOSIS — Z881 Allergy status to other antibiotic agents status: Secondary | ICD-10-CM | POA: Diagnosis not present

## 2016-06-08 DIAGNOSIS — J449 Chronic obstructive pulmonary disease, unspecified: Secondary | ICD-10-CM | POA: Diagnosis present

## 2016-06-08 DIAGNOSIS — E038 Other specified hypothyroidism: Secondary | ICD-10-CM | POA: Diagnosis not present

## 2016-06-08 DIAGNOSIS — E785 Hyperlipidemia, unspecified: Secondary | ICD-10-CM | POA: Diagnosis present

## 2016-06-08 DIAGNOSIS — M6281 Muscle weakness (generalized): Secondary | ICD-10-CM | POA: Diagnosis not present

## 2016-06-08 DIAGNOSIS — R197 Diarrhea, unspecified: Secondary | ICD-10-CM | POA: Diagnosis not present

## 2016-06-08 DIAGNOSIS — E1122 Type 2 diabetes mellitus with diabetic chronic kidney disease: Secondary | ICD-10-CM | POA: Diagnosis present

## 2016-06-08 DIAGNOSIS — F329 Major depressive disorder, single episode, unspecified: Secondary | ICD-10-CM | POA: Diagnosis present

## 2016-06-08 DIAGNOSIS — G8929 Other chronic pain: Secondary | ICD-10-CM | POA: Diagnosis present

## 2016-06-08 HISTORY — DX: Type 2 diabetes mellitus with hyperglycemia: E11.65

## 2016-06-08 HISTORY — DX: Chronic kidney disease, stage 3 unspecified: N18.30

## 2016-06-08 HISTORY — DX: Long term (current) use of insulin: Z79.4

## 2016-06-08 LAB — CBC
HCT: 35.6 % — ABNORMAL LOW (ref 36.0–46.0)
HEMOGLOBIN: 12 g/dL (ref 12.0–15.0)
MCH: 28.4 pg (ref 26.0–34.0)
MCHC: 33.7 g/dL (ref 30.0–36.0)
MCV: 84.4 fL (ref 78.0–100.0)
PLATELETS: 205 10*3/uL (ref 150–400)
RBC: 4.22 MIL/uL (ref 3.87–5.11)
RDW: 14.8 % (ref 11.5–15.5)
WBC: 20.2 10*3/uL — ABNORMAL HIGH (ref 4.0–10.5)

## 2016-06-08 LAB — BASIC METABOLIC PANEL
Anion gap: 8 (ref 5–15)
BUN: 18 mg/dL (ref 6–20)
CHLORIDE: 105 mmol/L (ref 101–111)
CO2: 24 mmol/L (ref 22–32)
CREATININE: 1.03 mg/dL — AB (ref 0.44–1.00)
Calcium: 8.1 mg/dL — ABNORMAL LOW (ref 8.9–10.3)
GFR calc non Af Amer: 55 mL/min — ABNORMAL LOW (ref >60)
Glucose, Bld: 269 mg/dL — ABNORMAL HIGH (ref 65–99)
Potassium: 3.3 mmol/L — ABNORMAL LOW (ref 3.5–5.1)
Sodium: 137 mmol/L (ref 135–145)

## 2016-06-08 LAB — C DIFFICILE QUICK SCREEN W PCR REFLEX
C DIFFICILE (CDIFF) INTERP: NOT DETECTED
C DIFFICILE (CDIFF) TOXIN: NEGATIVE
C DIFFICLE (CDIFF) ANTIGEN: NEGATIVE

## 2016-06-08 LAB — GLUCOSE, CAPILLARY
GLUCOSE-CAPILLARY: 314 mg/dL — AB (ref 65–99)
GLUCOSE-CAPILLARY: 157 mg/dL — AB (ref 65–99)
Glucose-Capillary: 260 mg/dL — ABNORMAL HIGH (ref 65–99)
Glucose-Capillary: 235 mg/dL — ABNORMAL HIGH (ref 65–99)
Glucose-Capillary: 276 mg/dL — ABNORMAL HIGH (ref 65–99)

## 2016-06-08 LAB — TSH: TSH: 3.81 u[IU]/mL (ref 0.350–4.500)

## 2016-06-08 IMAGING — CT CT ABD-PELV W/ CM
2 of 5 series · 15 of 46 positions shown, 17 images · IV contrast (Isovue)
Comparison: [DATE]

CLINICAL DATA: Low back pain for 4 days.  Diarrhea.

EXAM:
CT ABDOMEN AND PELVIS WITH CONTRAST
TECHNIQUE: Multidetector CT imaging of the abdomen and pelvis was performed
using the standard protocol following bolus administration of
intravenous contrast.
CONTRAST:  100mL [U4] IOPAMIDOL ([U4]) INJECTION 61%

[Series 2: axial st · axial · 0.82mm/px · z∈[+931,+1326]mm · 12 of 91 slices shown, 14 images]
[im 6/91  soft-tissue]
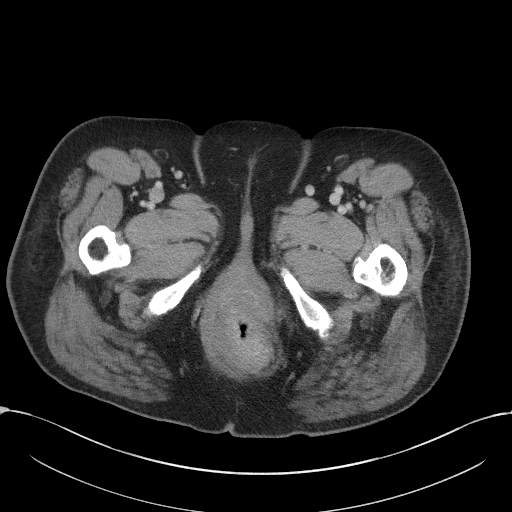
[im 6/91  bone]
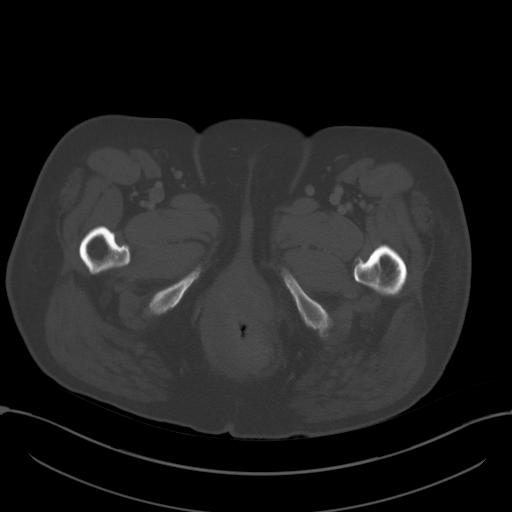
[im 16/91  soft-tissue]
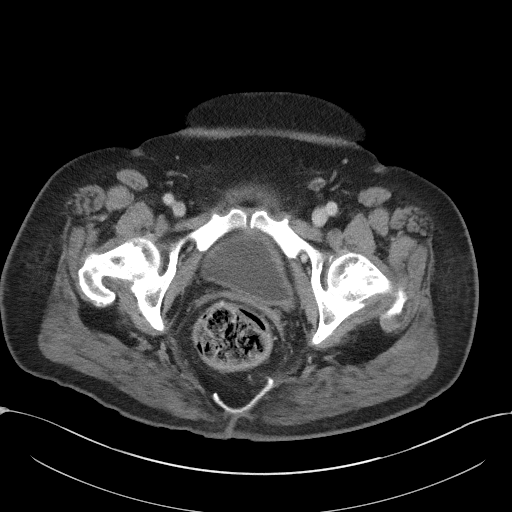
[im 22/91  soft-tissue]
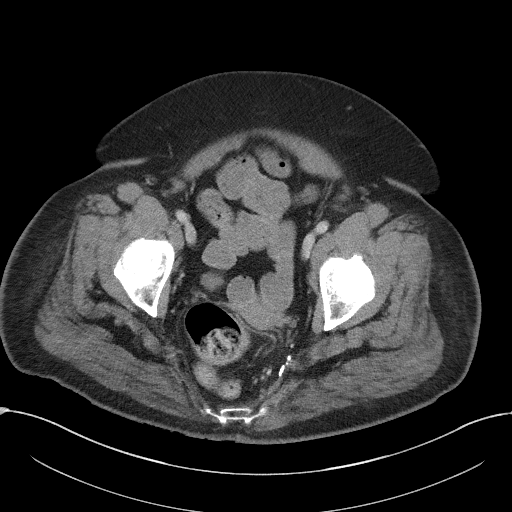
[im 27/91  soft-tissue]
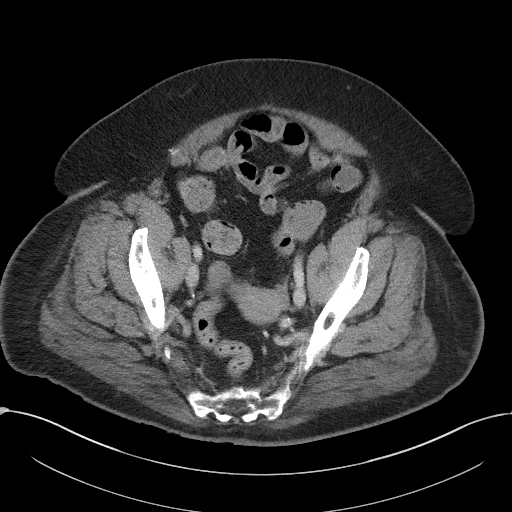
[im 38/91  soft-tissue]
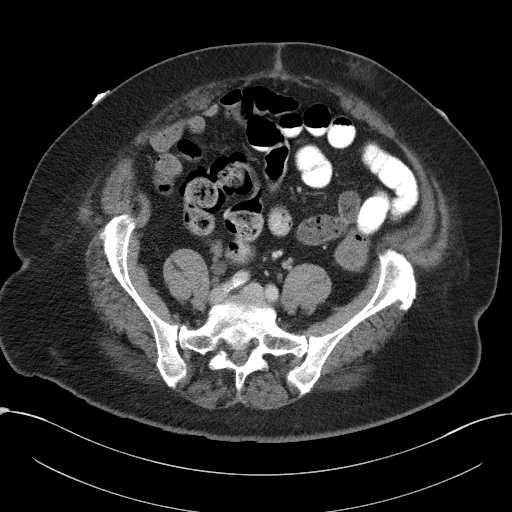
[im 43/91  soft-tissue]
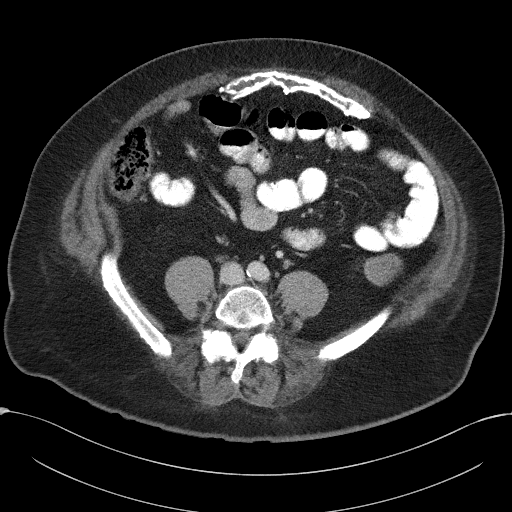
[im 48/91  soft-tissue]
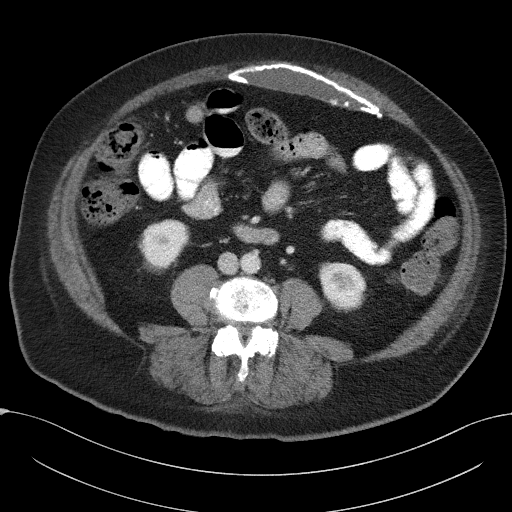
[im 59/91  soft-tissue]
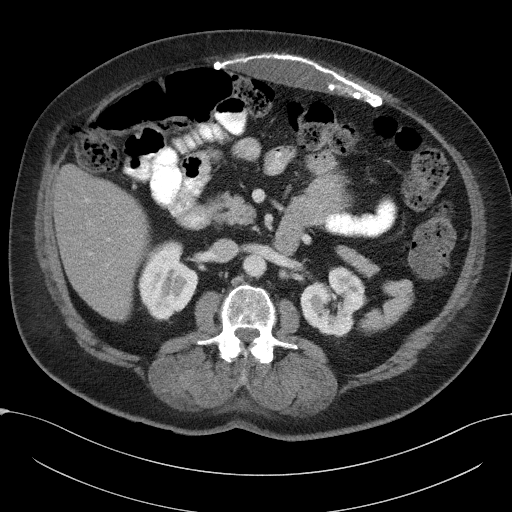
[im 64/91  soft-tissue]
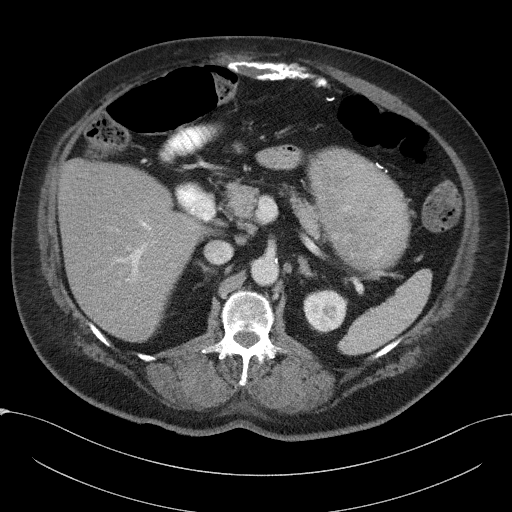
[im 64/91  bone]
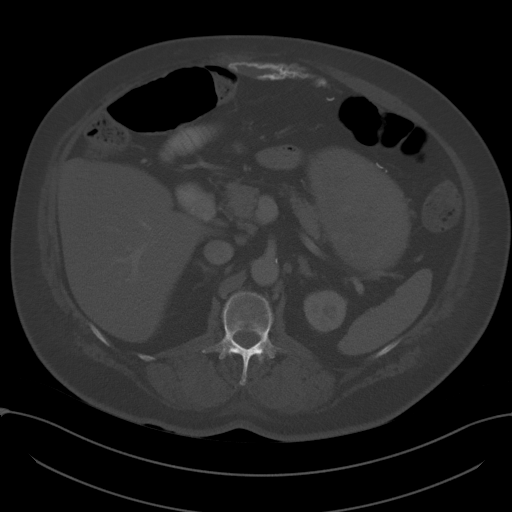
[im 69/91  soft-tissue]
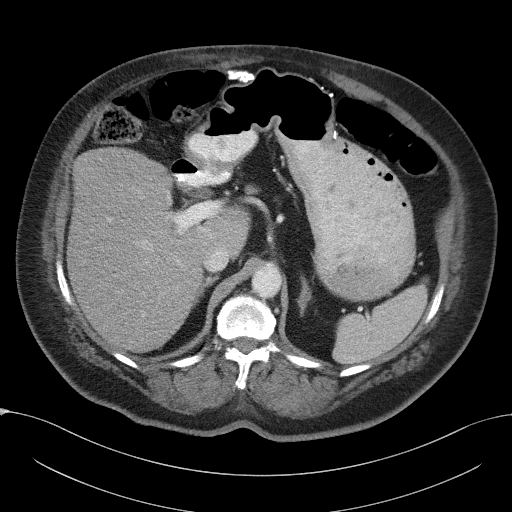
[im 80/91  soft-tissue]
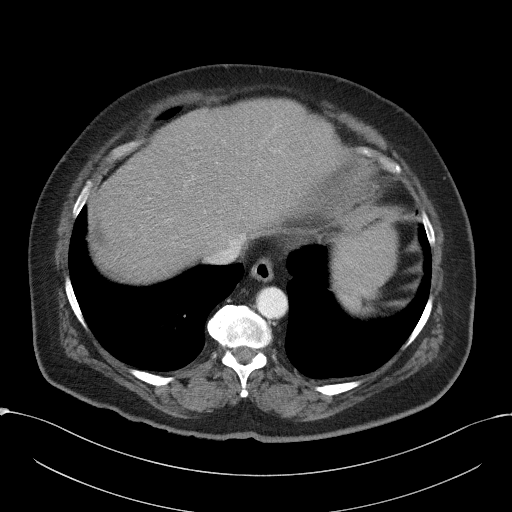
[im 85/91  soft-tissue]
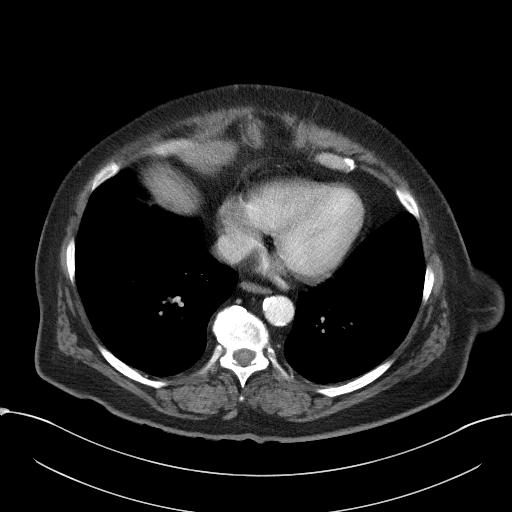

[Series 4: coronal st · coronal · 0.79mm/px · 3 of 114 slices shown]
[im 38/114  soft-tissue]
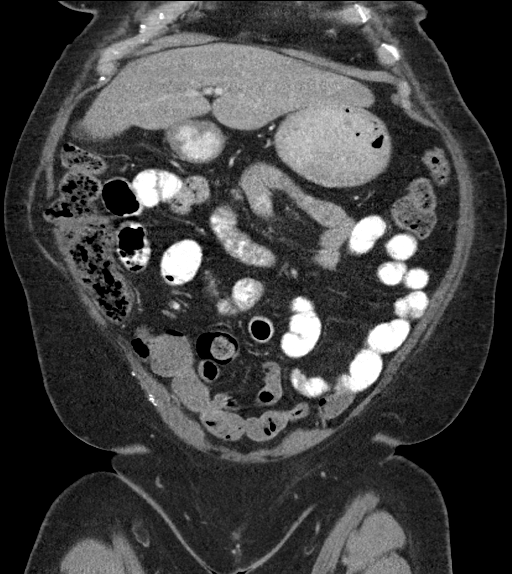
[im 51/114  soft-tissue]
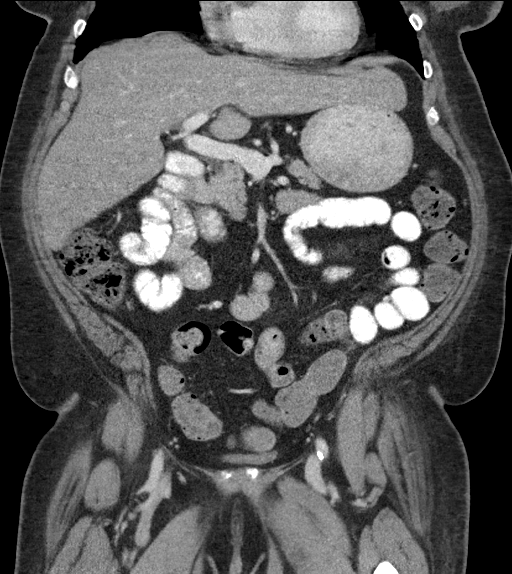
[im 63/114  soft-tissue]
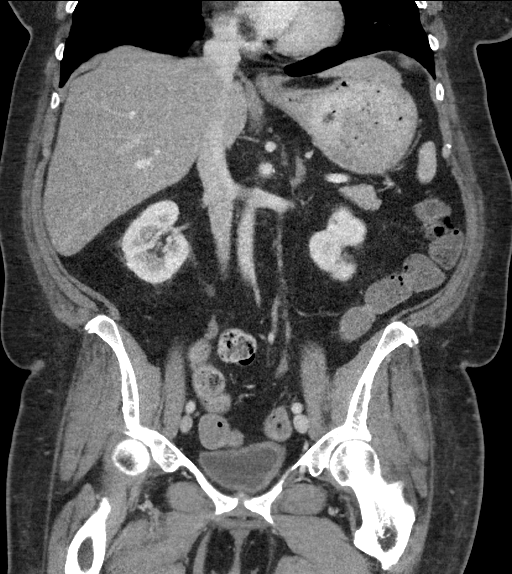

[15 of 46 positions shown; findings below may reference images not displayed]

FINDINGS: Lower chest: Descending thoracic aortic atherosclerotic
calcification.

Hepatobiliary: Somewhat low-density liver, possible steatosis.
Cholecystectomy. No specific focal liver lesion is identified.

Pancreas: Unremarkable

Spleen: Unremarkable

Adrenals/Urinary Tract: Mild scarring in the left kidney lower pole
anteriorly. Several nonobstructive left renal calculi measuring up
to 3 mm in diameter in the mid kidney. Slightly dilated right mid
ureteral segment without obvious cause.

Adrenal glands unremarkable.

Stomach/Bowel: Postoperative findings along the anterior margin of
the stomach. Small right Spigelian hernia with a knuckle of
ascending colon extending into the hernia on image 35/2, without
findings complicating feature. There is formed stool in the sigmoid
colon rectum although there is also a small amount of fluid in the
rectal vault distally near the anorectal junction.

Vascular/Lymphatic: Peripancreatic node 1 cm in short axis on image
[DATE]. Small periaortic lymph nodes are not enlarged. Aortoiliac
atherosclerotic vascular disease.

Reproductive: Unremarkable

Other: No supplemental non-categorized findings.

Musculoskeletal: Ventral density suggesting possible hernia mesh
along the anterior abdominal wall with fluid collection along its
margin as on image 37/2, not appreciably changed from the earlier
exam. Absent coccyx with a flat mesh in the expected location of the
coccyx as on image 68/5, with pelvic floor laxity and potentially
mild rectal prolapse.

L5 is partially sacralized. Facet arthropathy and intervertebral
spurring in the lumbar spine along with suspected degenerative disc
disease at L3-4 and L4-5 potentially causing mild levels of
impingement.
IMPRESSION: 1. Possible rectal prolapse with pelvic floor laxity. This is at
least partially related to the absence of the coccyx and the
posterior mesh implant in the vicinity of the coccyx. In the rectal
vault there is both formed stool and some fluid, along with a
potentially patulous anorectal junction.
2. Essentially stable appearance of fluid collection along a
calcified anterior abdominal wall mesh.
3. Spigelian hernia on the right contains a small knuckle of colon,
without current evidence of complicating feature.
4. Suspected hepatic steatosis.
5.  Aortoiliac atherosclerotic vascular disease.
6. Potential mild levels of impingement in the lumbar spine due to
spondylosis and degenerative disc disease.
7. Nonobstructive left nephrolithiasis. Mild scarring in the left
kidney lower pole anteriorly.

## 2016-06-08 MED ORDER — IOPAMIDOL (ISOVUE-300) INJECTION 61%
INTRAVENOUS | Status: AC
  Filled 2016-06-08: qty 30

## 2016-06-08 MED ORDER — CIPROFLOXACIN IN D5W 400 MG/200ML IV SOLN
400.0000 mg | Freq: Two times a day (BID) | INTRAVENOUS | Status: DC
  Administered 2016-06-08: 400 mg via INTRAVENOUS
  Filled 2016-06-08: qty 200

## 2016-06-08 MED ORDER — POLYETHYLENE GLYCOL 3350 17 GM/SCOOP PO POWD
17.0000 g | Freq: Every day | ORAL | Status: DC
  Filled 2016-06-08: qty 255

## 2016-06-08 MED ORDER — HEPARIN SODIUM (PORCINE) 5000 UNIT/ML IJ SOLN
5000.0000 [IU] | Freq: Three times a day (TID) | INTRAMUSCULAR | Status: DC
  Administered 2016-06-08 – 2016-06-14 (×19): 5000 [IU] via SUBCUTANEOUS
  Filled 2016-06-08 (×19): qty 1

## 2016-06-08 MED ORDER — HYDROCODONE-ACETAMINOPHEN 5-325 MG PO TABS
1.0000 | ORAL_TABLET | Freq: Four times a day (QID) | ORAL | Status: DC | PRN
  Administered 2016-06-08 – 2016-06-13 (×8): 1 via ORAL
  Filled 2016-06-08 (×9): qty 1

## 2016-06-08 MED ORDER — TEMAZEPAM 15 MG PO CAPS
15.0000 mg | ORAL_CAPSULE | Freq: Every day | ORAL | Status: DC
  Administered 2016-06-08 – 2016-06-13 (×7): 15 mg via ORAL
  Filled 2016-06-08 (×7): qty 1

## 2016-06-08 MED ORDER — ACETAMINOPHEN 650 MG RE SUPP: 650.0000 mg | Freq: Four times a day (QID) | RECTAL | Status: DC | PRN

## 2016-06-08 MED ORDER — LEVOTHYROXINE SODIUM 50 MCG PO TABS
50.0000 ug | ORAL_TABLET | Freq: Every day | ORAL | Status: DC
  Administered 2016-06-08 – 2016-06-14 (×7): 50 ug via ORAL
  Filled 2016-06-08 (×7): qty 1

## 2016-06-08 MED ORDER — SIMVASTATIN 20 MG PO TABS
40.0000 mg | ORAL_TABLET | Freq: Every day | ORAL | Status: DC
  Administered 2016-06-08 – 2016-06-13 (×7): 40 mg via ORAL
  Filled 2016-06-08 (×7): qty 2

## 2016-06-08 MED ORDER — INSULIN ASPART PROT & ASPART (70-30 MIX) 100 UNIT/ML ~~LOC~~ SUSP
18.0000 [IU] | Freq: Two times a day (BID) | SUBCUTANEOUS | Status: DC
  Administered 2016-06-08 – 2016-06-11 (×8): 18 [IU] via SUBCUTANEOUS
  Filled 2016-06-08: qty 10

## 2016-06-08 MED ORDER — CHLORPROMAZINE HCL 100 MG PO TABS
100.0000 mg | ORAL_TABLET | Freq: Every day | ORAL | Status: DC
  Administered 2016-06-08 – 2016-06-13 (×7): 100 mg via ORAL
  Filled 2016-06-08 (×7): qty 1

## 2016-06-08 MED ORDER — ACETAMINOPHEN 325 MG PO TABS
650.0000 mg | ORAL_TABLET | Freq: Four times a day (QID) | ORAL | Status: DC | PRN
Start: 2016-06-08 — End: 2016-06-14
  Administered 2016-06-10 – 2016-06-12 (×2): 650 mg via ORAL
  Filled 2016-06-08 (×3): qty 2

## 2016-06-08 MED ORDER — PNEUMOCOCCAL VAC POLYVALENT 25 MCG/0.5ML IJ INJ
0.5000 mL | INJECTION | INTRAMUSCULAR | Status: AC
  Administered 2016-06-09: 0.5 mL via INTRAMUSCULAR
  Filled 2016-06-08: qty 0.5

## 2016-06-08 MED ORDER — INSULIN GLARGINE 100 UNIT/ML ~~LOC~~ SOLN
30.0000 [IU] | Freq: Every day | SUBCUTANEOUS | Status: DC
  Administered 2016-06-08 – 2016-06-10 (×4): 30 [IU] via SUBCUTANEOUS
  Filled 2016-06-08 (×7): qty 0.3

## 2016-06-08 MED ORDER — DEXAMETHASONE 4 MG PO TABS: 8.0000 mg | ORAL_TABLET | Freq: Two times a day (BID) | ORAL | Status: DC

## 2016-06-08 MED ORDER — DEXTROSE 5 % IV SOLN
2.0000 g | Freq: Three times a day (TID) | INTRAVENOUS | Status: DC
  Administered 2016-06-08 – 2016-06-14 (×18): 2 g via INTRAVENOUS
  Filled 2016-06-08 (×21): qty 2

## 2016-06-08 MED ORDER — PANTOPRAZOLE SODIUM 40 MG PO TBEC
40.0000 mg | DELAYED_RELEASE_TABLET | Freq: Every day | ORAL | Status: DC
  Administered 2016-06-08 – 2016-06-14 (×7): 40 mg via ORAL
  Filled 2016-06-08 (×7): qty 1

## 2016-06-08 MED ORDER — ONDANSETRON HCL 4 MG PO TABS: 4.0000 mg | ORAL_TABLET | Freq: Four times a day (QID) | ORAL | Status: DC | PRN

## 2016-06-08 MED ORDER — INSULIN ASPART 100 UNIT/ML ~~LOC~~ SOLN
0.0000 [IU] | Freq: Every day | SUBCUTANEOUS | Status: DC
Start: 2016-06-08 — End: 2016-06-14
  Administered 2016-06-08: 3 [IU] via SUBCUTANEOUS
  Administered 2016-06-10: 2 [IU] via SUBCUTANEOUS

## 2016-06-08 MED ORDER — SODIUM CHLORIDE 0.9 % IV SOLN
INTRAVENOUS | Status: DC
  Administered 2016-06-08 – 2016-06-11 (×5): via INTRAVENOUS

## 2016-06-08 MED ORDER — ALLOPURINOL 100 MG PO TABS
100.0000 mg | ORAL_TABLET | Freq: Every day | ORAL | Status: DC
  Administered 2016-06-08 – 2016-06-14 (×7): 100 mg via ORAL
  Filled 2016-06-08 (×7): qty 1

## 2016-06-08 MED ORDER — LORAZEPAM 0.5 MG PO TABS: 0.5000 mg | ORAL_TABLET | Freq: Four times a day (QID) | ORAL | Status: DC | PRN

## 2016-06-08 MED ORDER — SODIUM CHLORIDE 0.9% FLUSH
3.0000 mL | Freq: Two times a day (BID) | INTRAVENOUS | Status: DC
  Administered 2016-06-08 – 2016-06-14 (×9): 3 mL via INTRAVENOUS

## 2016-06-08 MED ORDER — MIRTAZAPINE 15 MG PO TABS
15.0000 mg | ORAL_TABLET | Freq: Every day | ORAL | Status: DC
  Administered 2016-06-08 – 2016-06-13 (×7): 15 mg via ORAL
  Filled 2016-06-08 (×7): qty 1

## 2016-06-08 MED ORDER — IOPAMIDOL (ISOVUE-300) INJECTION 61%
100.0000 mL | Freq: Once | INTRAVENOUS | Status: AC | PRN
  Administered 2016-06-08: 100 mL via INTRAVENOUS

## 2016-06-08 MED ORDER — ASPIRIN EC 81 MG PO TBEC
81.0000 mg | DELAYED_RELEASE_TABLET | Freq: Every day | ORAL | Status: DC
  Administered 2016-06-08 – 2016-06-14 (×7): 81 mg via ORAL
  Filled 2016-06-08 (×7): qty 1

## 2016-06-08 MED ORDER — ONDANSETRON HCL 4 MG/2ML IJ SOLN: 4.0000 mg | Freq: Four times a day (QID) | INTRAMUSCULAR | Status: DC | PRN

## 2016-06-08 MED ORDER — MORPHINE SULFATE (PF) 2 MG/ML IV SOLN
2.0000 mg | INTRAVENOUS | Status: DC | PRN
  Administered 2016-06-08 – 2016-06-13 (×19): 2 mg via INTRAVENOUS
  Filled 2016-06-08 (×19): qty 1

## 2016-06-08 MED ORDER — PROCHLORPERAZINE MALEATE 5 MG PO TABS: 10.0000 mg | ORAL_TABLET | Freq: Four times a day (QID) | ORAL | Status: DC | PRN

## 2016-06-08 MED ORDER — POLYETHYLENE GLYCOL 3350 17 G PO PACK
17.0000 g | PACK | Freq: Every day | ORAL | Status: DC
  Administered 2016-06-11 – 2016-06-14 (×4): 17 g via ORAL
  Filled 2016-06-08 (×5): qty 1

## 2016-06-08 MED ORDER — VANCOMYCIN HCL 10 G IV SOLR
1500.0000 mg | Freq: Once | INTRAVENOUS | Status: AC
  Administered 2016-06-08: 1500 mg via INTRAVENOUS
  Filled 2016-06-08: qty 1500

## 2016-06-08 MED ORDER — INSULIN ASPART 100 UNIT/ML ~~LOC~~ SOLN
0.0000 [IU] | Freq: Three times a day (TID) | SUBCUTANEOUS | Status: DC
  Administered 2016-06-08: 11 [IU] via SUBCUTANEOUS
  Administered 2016-06-08: 5 [IU] via SUBCUTANEOUS
  Administered 2016-06-08 – 2016-06-09 (×4): 8 [IU] via SUBCUTANEOUS
  Administered 2016-06-10: 3 [IU] via SUBCUTANEOUS
  Administered 2016-06-10: 8 [IU] via SUBCUTANEOUS
  Administered 2016-06-10: 11 [IU] via SUBCUTANEOUS
  Administered 2016-06-11: 5 [IU] via SUBCUTANEOUS
  Administered 2016-06-11 (×2): 8 [IU] via SUBCUTANEOUS
  Administered 2016-06-12: 3 [IU] via SUBCUTANEOUS
  Administered 2016-06-12 (×2): 5 [IU] via SUBCUTANEOUS
  Administered 2016-06-13: 3 [IU] via SUBCUTANEOUS
  Administered 2016-06-13: 5 [IU] via SUBCUTANEOUS
  Administered 2016-06-13 – 2016-06-14 (×2): 2 [IU] via SUBCUTANEOUS
  Administered 2016-06-14: 5 [IU] via SUBCUTANEOUS

## 2016-06-08 MED ORDER — VANCOMYCIN HCL IN DEXTROSE 1-5 GM/200ML-% IV SOLN
1000.0000 mg | Freq: Two times a day (BID) | INTRAVENOUS | Status: DC
  Administered 2016-06-09 – 2016-06-12 (×7): 1000 mg via INTRAVENOUS
  Filled 2016-06-08 (×7): qty 200

## 2016-06-08 NOTE — Care Management Obs Status (Signed)
[facility] NOTIFICATION   Patient Details  Name: Margaret Mcdaniel MRN: [id number] Date of Birth: 21-Jan-1951   Medicare Observation Status Notification Given:  Yes    Sherald Barge, RN 06/08/2016, 11:20 AM

## 2016-06-08 NOTE — Care Management Note (Signed)
Case Management Note  Patient Details  Name: Margaret Mcdaniel MRN: [id number] Date of Birth: 03-Mar-1951  Subjective/Objective:                  Pt admitted with peylonephritis. She is from home, lives alone. Has son and daughter who are very involved. She was previously DC'd with HH but they have since signed off. Per daughter they are not interested in [facility] again, PCP is working on finding aid care. Pt has no DME needs. Pt for return home at DC. No needs communicated.  Action/Plan: No needs anticipated.   Expected Discharge Date:      06/09/2016            Expected Discharge Plan:  Home/Self Care  In-House Referral:  NA  Discharge planning Services  CM Consult  Post Acute Care Choice:  NA Choice offered to:  NA  Status of Service:  Completed, signed off  Sherald Barge, RN 06/08/2016, 11:23 AM

## 2016-06-08 NOTE — Progress Notes (Signed)
Pharmacy Antibiotic Note  Margaret Mcdaniel is a 66 y.o. female admitted on 06/07/2016 with sepsis.  Pharmacy has been consulted for VANCOMYCIN dosing.  Plan: Vancomycin 1500mg  x 1 then 1000mg  q12hrs Monitor labs, progress, c/s  Height: 5\' 4"  (162.6 cm) Weight: 195 lb 12.3 oz (88.8 kg) IBW/kg (Calculated) : 54.7  Temp (24hrs), Avg:98.7 F (37.1 C), Min:97.6 F (36.4 C), Max:99.8 F (37.7 C)   Recent Labs Lab 06/07/16 0934 06/07/16 2010 06/07/16 2013 06/07/16 2240 06/08/16 0627  WBC 8.7 16.5*  --   --  20.2*  CREATININE 1.26* 1.33*  --   --  1.03*  LATICACIDVEN  --   --  1.8 1.7  --     Estimated Creatinine Clearance: 57.1 mL/min (A) (by C-G formula based on SCr of 1.03 mg/dL (H)).    Allergies  Allergen Reactions  . Keflex [Cephalexin] Anaphylaxis    Pt "codes" on this medication.  . Sulfa Antibiotics Hives  . Latex Rash   Antimicrobials this admission: Vancomycin 4/3 >>  Aztreonam 4/3 >>   Dose adjustments this admission:  Microbiology results:  BCx: pending  UCx: pending   Sputum:    MRSA PCR:   Thank you for allowing pharmacy to be a part of this patient's care.  Hart Robinsons A 06/08/2016 3:34 PM

## 2016-06-08 NOTE — Care Management Note (Signed)
Case Management Note  Patient Details  Name: Margaret Mcdaniel MRN: [id number] Date of Birth: 03/04/1951  Expected Discharge Date:      06/09/2016            Expected Discharge Plan:  Home/Self Care  In-House Referral:  NA  Discharge planning Services  CM Consult  Post Acute Care Choice:  Durable Medical Equipment Choice offered to:  Adult Children  DME Arranged:  3-N-1 DME Agency:  [facility].  Status of Service:  Completed, signed off  Additional Comments: Pt's DIL asking for 3-in-1. Order placed and Romualdo Bolk, of [facility], per family choice, aware of referral, will obtain pt info from chart and deliver to pt room prior to DC.  Sherald Barge, RN 06/08/2016, 12:52 PM

## 2016-06-08 NOTE — H&P (Signed)
History and Physical    Margaret Mcdaniel SWN:[id number] DOB: November 19, 1950 DOA: 06/07/2016  PCP: Wende Neighbors, MD  Patient coming from: home.    Chief Complaint: flank pain and fever.   HPI: Margaret Mcdaniel is an 66 y.o. female with hx of breast Cancer, undergoing cheomotherapy, hx of hypothyroidsim, anxiety, Parkinson's disease, DM on insulin, s/p Portacath, presented to the ER with fever of 101, malaise, some diarrhea, and lower back pain.  She has no nausea, vomiting, fever, or chills.  Evaluation in the ER included TNTC wbc's, and bacterial, a leukocytosis with WBC of 16K, normal Hb and platelet count, Hb of 13g per dL. CXR was clear.  Hospitalist was asked to admit her for UTI.   ED Course:  See above.  Rewiew of Systems:  Constitutional: No significant weight loss or weight gain Eyes: Negative for eye pain, redness and discharge, diplopia, visual changes, or flashes of light. ENMT: Negative for ear pain, hoarseness, nasal congestion, sinus pressure and sore throat. No headaches; tinnitus, drooling, or problem swallowing. Cardiovascular: Negative for chest pain, palpitations, diaphoresis, dyspnea and peripheral edema. ; No orthopnea, PND Respiratory: Negative for cough, hemoptysis, wheezing and stridor. No pleuritic chestpain. Gastrointestinal: Negative for diarrhea, constipation,  melena, blood in stool, hematemesis, jaundice and rectal bleeding.    Genitourinary: Negative for frequency, dysuria, incontinence, and hematuria; Musculoskeletal: Negative for  and neck pain. Negative for swelling and trauma.;  Skin: . Negative for pruritus, rash, abrasions, bruising and skin lesion.; ulcerations Neuro: Negative for headache, lightheadedness and neck stiffness. Negative for weakness, altered level of consciousness , altered mental status, extremity weakness, burning feet, involuntary movement, seizure and syncope.  Psych: negative for anxiety, depression, insomnia, tearfulness, panic attacks,  hallucinations, paranoia, suicidal or homicidal ideation   Past Medical History:  Diagnosis Date  . Anxiety   . Arthritis   . Breast cancer metastasized to axillary lymph node, left ([place]) 04/14/2016  . Cancer ([place])    left breast  . COPD (chronic obstructive pulmonary disease) ([place])   . Diabetes mellitus without complication ([place])   . GERD (gastroesophageal reflux disease)   . History of kidney stones   . Hypothyroidism   . Mental disorder   . Neuromuscular disorder ([place])   . PONV (postoperative nausea and vomiting)   . Sleep apnea    Uses CPAP    Past Surgical History:  Procedure Laterality Date  . ANKLE SURGERY Right    fusion  . APPENDECTOMY    . CHOLECYSTECTOMY    . EXPLORATORY LAPAROTOMY    . HEMORRHOID SURGERY    . MASTECTOMY MODIFIED RADICAL Left 04/14/2016   Procedure: LEFT MODIFIED RADICAL MASTECTOMY;  Surgeon: Aviva Signs, MD;  Location: [facility];  Service: General;  Laterality: Left;  . PORTACATH PLACEMENT N/A 05/26/2016   Procedure: INSERTION PORT-A-CATH RIGHT SUBCLAVIAN AND  DRAINAGE OF SEROMA LEFT CHEST;  Surgeon: Aviva Signs, MD;  Location: [facility];  Service: General;  Laterality: N/A;  . ROTATOR CUFF REPAIR    . TONSILLECTOMY    . TUBAL LIGATION       reports that she quit smoking about 2 years ago. Her smoking use included Cigarettes. She has a 50.00 pack-year smoking history. She has never used smokeless tobacco. She reports that she does not drink alcohol or use drugs.  Allergies  Allergen Reactions  . Keflex [Cephalexin] Anaphylaxis    Pt "codes" on this medication.  . Sulfa Antibiotics Hives  . Latex Rash    History reviewed. No pertinent  family history.   Prior to Admission medications   Medication Sig Start Date End Date Taking? Authorizing Provider  allopurinol (ZYLOPRIM) 100 MG tablet Take 100 mg by mouth daily.   Yes Historical Provider, MD  aspirin EC 81 MG tablet Take 81 mg by mouth daily.   Yes Historical Provider, MD  chlorproMAZINE  (THORAZINE) 50 MG tablet Take 100 mg by mouth at bedtime.    Yes Historical Provider, MD  CYCLOPHOSPHAMIDE IV Inject into the vein. Every 3 weeks   Yes Historical Provider, MD  dexamethasone (DECADRON) 4 MG tablet Take 2 tablets (8 mg total) by mouth 2 (two) times daily. Start the day before Taxotere. Then again the day after chemo for 3 days. 05/17/16  Yes Twana First, MD  DOCEtaxel (TAXOTERE IV) Inject into the vein. Every 3 weeks   Yes Historical Provider, MD  HYDROcodone-acetaminophen (NORCO) 5-325 MG tablet Take 1 tablet by mouth every 6 (six) hours as needed for moderate pain. 04/16/16  Yes Aviva Signs, MD  insulin degludec (TRESIBA FLEXTOUCH) 100 UNIT/ML SOPN FlexTouch Pen Inject 30 Units into the skin at bedtime.   Yes Historical Provider, MD  insulin lispro protamine-lispro (HUMALOG MIX 75/25) (75-25) 100 UNIT/ML SUSP injection Inject 25 Units into the skin 2 (two) times daily with a meal.   Yes Historical Provider, MD  levothyroxine (SYNTHROID, LEVOTHROID) 50 MCG tablet Take 50 mcg by mouth daily before breakfast.   Yes Historical Provider, MD  lidocaine-prilocaine (EMLA) cream Apply to affected area once Patient taking differently: Apply 1 application topically as directed. Apply to affected area once 05/17/16  Yes Twana First, MD  LORazepam (ATIVAN) 0.5 MG tablet Take 1 tablet (0.5 mg total) by mouth every 6 (six) hours as needed (Nausea or vomiting). 05/17/16  Yes Twana First, MD  mirtazapine (REMERON) 15 MG tablet Take 15 mg by mouth at bedtime.   Yes Historical Provider, MD  omeprazole (PRILOSEC) 40 MG capsule Take 40 mg by mouth daily.   Yes Historical Provider, MD  ondansetron (ZOFRAN) 8 MG tablet Take 1 tablet (8 mg total) by mouth 2 (two) times daily as needed for refractory nausea / vomiting. Start on day 3 after chemo. 05/17/16  Yes Twana First, MD  Pegfilgrastim (NEULASTA ONPRO [place]) Inject into the skin. Every 21 days   Yes Historical Provider, MD  polyethylene glycol powder  (GLYCOLAX/MIRALAX) powder Take 17 g by mouth daily.  04/03/16  Yes Historical Provider, MD  prochlorperazine (COMPAZINE) 10 MG tablet Take 1 tablet (10 mg total) by mouth every 6 (six) hours as needed (Nausea or vomiting). 05/17/16  Yes Twana First, MD  simvastatin (ZOCOR) 40 MG tablet Take 40 mg by mouth at bedtime.   Yes Historical Provider, MD  temazepam (RESTORIL) 15 MG capsule Take 15 mg by mouth at bedtime.   Yes Historical Provider, MD  tiZANidine (ZANAFLEX) 4 MG tablet Take 8 mg by mouth 2 (two) times daily.   Yes Historical Provider, MD    Physical Exam: Vitals:   06/07/16 2245 06/07/16 2300 06/07/16 2330 06/08/16 0000  BP:  (!) 143/94 (!) 154/82 136/85  Pulse: 100 99 95 (!) 108  Resp: 15 18 18 15   Temp:      TempSrc:      SpO2: 95% 96% 97% 96%  Weight:      Height:          Constitutional: NAD, calm, comfortable Vitals:   06/07/16 2245 06/07/16 2300 06/07/16 2330 06/08/16 0000  BP:  (!) 143/94 Marland Kitchen)  154/82 136/85  Pulse: 100 99 95 (!) 108  Resp: 15 18 18 15   Temp:      TempSrc:      SpO2: 95% 96% 97% 96%  Weight:      Height:       Eyes: PERRL, lids and conjunctivae normal ENMT: Mucous membranes are moist. Posterior pharynx clear of any exudate or lesions.Normal dentition.  Neck: normal, supple, no masses, no thyromegaly Respiratory: clear to auscultation bilaterally, no wheezing, no crackles. Normal respiratory effort. No accessory muscle use.  Cardiovascular: Regular rate and rhythm, no murmurs / rubs / gallops. No extremity edema. 2+ pedal pulses. No carotid bruits.  Abdomen: no tenderness, no masses palpated. No hepatosplenomegaly. Bowel sounds positive.  Musculoskeletal: no clubbing / cyanosis. No joint deformity upper and lower extremities. Good ROM, no contractures. Normal muscle tone.  Skin: no rashes, lesions, ulcers. No induration Neurologic: CN 2-12 grossly intact. Sensation intact, DTR normal. Strength 5/5 in all 4.  Psychiatric: Normal judgment and  insight. Alert and oriented x 3. Normal mood.     Labs on Admission: I have personally reviewed following labs and imaging studies  CBC:  Recent Labs Lab 06/07/16 0934 06/07/16 2010  WBC 8.7 16.5*  NEUTROABS 2.2 6.1  HGB 12.9 13.0  HCT 39.4 38.6  MCV 85.1 83.9  PLT 185 710   Basic Metabolic Panel:  Recent Labs Lab 06/07/16 0934 06/07/16 2010  NA 131* 132*  K 3.9 3.7  CL 99* 98*  CO2 18* 20*  GLUCOSE 490* 267*  BUN 20 22*  CREATININE 1.26* 1.33*  CALCIUM 8.6* 9.2   GFR: Estimated Creatinine Clearance: 44 mL/min (A) (by C-G formula based on SCr of 1.33 mg/dL (H)). Liver Function Tests:  Recent Labs Lab 06/07/16 0934 06/07/16 2010  AST 36 29  ALT 27 29  ALKPHOS 84 87  BILITOT 0.6 0.6  PROT 6.3* 6.6  ALBUMIN 3.2* 3.5   Recent Labs Lab 06/07/16 2010  INR 1.05   Urine analysis:    Component Value Date/Time   COLORURINE YELLOW 06/07/2016 1933   APPEARANCEUR CLOUDY (A) 06/07/2016 1933   LABSPEC 1.016 06/07/2016 1933   PHURINE 5.0 06/07/2016 1933   GLUCOSEU >=500 (A) 06/07/2016 1933   HGBUR MODERATE (A) 06/07/2016 1933   BILIRUBINUR NEGATIVE 06/07/2016 1933   KETONESUR 5 (A) 06/07/2016 1933   PROTEINUR NEGATIVE 06/07/2016 1933   UROBILINOGEN 0.2 08/02/2012 1250   NITRITE POSITIVE (A) 06/07/2016 1933   LEUKOCYTESUR LARGE (A) 06/07/2016 1933    Recent Results (from the past 240 hour(s))  Culture, blood (Routine x 2)     Status: None (Preliminary result)   Collection Time: 06/07/16  8:13 PM  Result Value Ref Range Status   Specimen Description BLOOD RIGHT FOREARM  Final   Special Requests   Final    BOTTLES DRAWN AEROBIC AND ANAEROBIC Blood Culture results may not be optimal due to an inadequate volume of blood received in culture bottles   Culture PENDING  Incomplete   Report Status PENDING  Incomplete  Culture, blood (Routine x 2)     Status: None (Preliminary result)   Collection Time: 06/07/16  8:13 PM  Result Value Ref Range Status    Specimen Description RIGHT ANTECUBITAL  Final   Special Requests   Final    BOTTLES DRAWN AEROBIC AND ANAEROBIC Blood Culture adequate volume   Culture PENDING  Incomplete   Report Status PENDING  Incomplete     Radiological Exams on Admission: Dg Chest 2  View  Result Date: 06/07/2016 CLINICAL DATA:  Fever.  Cancer patient. EXAM: CHEST  2 VIEW COMPARISON:  05/26/2016 and 05/14/2016 FINDINGS: Power injectable Port-A-Cath tip:  Upper SVC. Thoracic spondylosis. Left axillary clips in the setting of prior left mastectomy. Atherosclerotic calcification of the aortic arch. No airspace opacity or pleural effusion. Slightly prominent pericardial adipose tissue as on prior exams, particularly along the cardiac apex. IMPRESSION: 1. No pneumonia or thoracic cause for the patient's fever is identified. 2.  Atherosclerotic calcification of the aortic arch. 3. Thoracic spondylosis. 4. Left mastectomy. Electronically Signed   By: Van Clines M.D.   On: 06/07/2016 20:01   Assessment/Plan Principal Problem:   Pyelonephritis Active Problems:   Breast cancer metastasized to axillary lymph node, left ([facility])   Hypothyroidism   PLAN:   Pyelonephritis:  Will continue with IV Cipro.  She had blood and urine culture.  Breast CA:  Continue with steroid, s/p chemo therapy today.  Follow CBC.  Hypothyrodism:  Continue with thyroid supplement.  Check TSH.   DVT prophylaxis: sub Q heparin.  Code Status: FULL CODE.  Family Communication: None.  Disposition Plan: Home.  Consults called: None.  Admission status: OBS.    Jarriel Papillion MD FACP. [facility]  If 7PM-7AM, please contact night-coverage [email or web address] Password [email or web address]  06/08/2016, 12:21 AM

## 2016-06-08 NOTE — Progress Notes (Signed)
PROGRESS NOTE  KALIYAN OSBOURN DUK:[id number] DOB: 08/26/1950 DOA: 06/07/2016 PCP: Wende Neighbors, MD  Brief History:  66 year old female with a history of left sided breast cancer, parkinsonism, diabetes mellitus, anxiety and hypothyroidism presents with bilateral lower back pain of 3 days duration.  The patient also has developed fevers and chills up to 102.84F at home. She has also noticed gradually increasing CBGs over the last 3 days at home. She has been taking ibuprofen at home for her fevers and pain. She normally takes ibuprofen up to 10 tablets a week. The patient had some nausea without emesis. Regarding her breast cancer, she was diagnosed on 04/14/2016 with her last chemotherapy on 05/31/2016. In the emergency room, patient was noted have WBC 16.5 with urinalysis showing TNTC WBC. She was started on ciprofloxacin for presumptive pyelonephritis.  Assessment/Plan: Sepsis -Secondary to pyelonephritis -Continue IV fluids -Patient presented with fever, tachycardia, and leukocytosis -Lactic acid 1.8 -Continue IV antibiotics  Pyelonephritis -The patient has a listed reaction of anaphylaxis to cephalexin -With the patient's rising WBC and minimal improvement, d/c cipro (suspect resistance due to prior exposure) -start aztreonam  -start vancomycin to empirically cover for Enterococcus pending cultures data -CT abd/pelvis  Diarrhea -pt having 8-10 loose BMs/day since admission -check C diff  CKD stage 2 to 3 -Baseline creatinine 1.0-1.3 -am BMP  Diabetes Mellitus type 2 -elevated CBGs secondary to infectious process Hemoglobin A1c -Continue Lantus 30 units daily -Continue 70/30--18 units with meals -Changed to resistance sliding scale  Left breast cancer with metastasis to LN -Diagnosed 04/14/2016 -Last chemotherapy therapy 05/31/2016 -Follow up Dr. Talbert Cage outpt  Parkinson's Disease -patient requested outpt neurologist -referral made to movement disorder specialist  in [place]  Anxiety/depression -Continue home dose Remeron, lorazepam  Hyperlipidemia -Continue statin  Hypothyroidism -Continue Synthroid -TSH 3.810    Disposition Plan:   Home in 2-3 days  Family Communication:   Family at bedside  Consultants:    Code Status:  FULL / DNR  DVT Prophylaxis:  [facility] Heparin / B and E Lovenox   Procedures: As Listed in Progress Note Above  Antibiotics: cipro 4/2>>4/3 vanco 4/3>>> Aztreonam 4/3>>>    Subjective: Patient complains of nausea without emesis. She states that her lower back is still hurting. Denies any headache, neck pain, chest pain, shortness of breath, dysuria, hematuria. There is no medication or melena.  Objective: Vitals:   06/08/16 0136 06/08/16 0559 06/08/16 0925 06/08/16 1325  BP: (!) 141/90 133/81  (!) 99/55  Pulse: 95 94  (!) 119  Resp: 16 16  20   Temp: 98.6 F (37 C) 97.6 F (36.4 C)  98.7 F (37.1 C)  TempSrc: Oral Oral  Oral  SpO2: 95% 97% 96% 96%  Weight: 88.8 kg (195 lb 12.3 oz)     Height: 5\' 4"  (1.626 m)       Intake/Output Summary (Last 24 hours) at 06/08/16 1527 Last data filed at 06/08/16 1326  Gross per 24 hour  Intake           931.25 ml  Output             1252 ml  Net          -320.75 ml   Weight change:  Exam:   General:  Pt is alert, follows commands appropriately, not in acute distress  HEENT: No icterus, No thrush, No neck mass, [place]/AT  Cardiovascular: RRR, S1/S2, no rubs, no gallops  Respiratory: CTA bilaterally, no wheezing,  no crackles, no rhonchi  Abdomen: Soft/+BS, non tender, non distended, no guarding+ CVA tender  Extremities: No edema, No lymphangitis, No petechiae, No rashes, no synovitis   Data Reviewed: I have personally reviewed following labs and imaging studies Basic Metabolic Panel:  Recent Labs Lab 06/07/16 0934 06/07/16 2010 06/08/16 0627  NA 131* 132* 137  K 3.9 3.7 3.3*  CL 99* 98* 105  CO2 18* 20* 24  GLUCOSE 490* 267* 269*  BUN 20 22* 18    CREATININE 1.26* 1.33* 1.03*  CALCIUM 8.6* 9.2 8.1*   Liver Function Tests:  Recent Labs Lab 06/07/16 0934 06/07/16 2010  AST 36 29  ALT 27 29  ALKPHOS 84 87  BILITOT 0.6 0.6  PROT 6.3* 6.6  ALBUMIN 3.2* 3.5   No results for input(s): LIPASE, AMYLASE in the last 168 hours. No results for input(s): AMMONIA in the last 168 hours. Coagulation Profile:  Recent Labs Lab 06/07/16 2010  INR 1.05   CBC:  Recent Labs Lab 06/07/16 0934 06/07/16 2010 06/08/16 0627  WBC 8.7 16.5* 20.2*  NEUTROABS 2.2 6.1  --   HGB 12.9 13.0 12.0  HCT 39.4 38.6 35.6*  MCV 85.1 83.9 84.4  PLT 185 211 205   Cardiac Enzymes: No results for input(s): CKTOTAL, CKMB, CKMBINDEX, TROPONINI in the last 168 hours. BNP: Invalid input(s): POCBNP CBG:  Recent Labs Lab 06/08/16 0158 06/08/16 0747 06/08/16 1150  GLUCAP 260* 235* 314*   HbA1C: No results for input(s): HGBA1C in the last 72 hours. Urine analysis:    Component Value Date/Time   COLORURINE YELLOW 06/07/2016 1933   APPEARANCEUR CLOUDY (A) 06/07/2016 1933   LABSPEC 1.016 06/07/2016 1933   PHURINE 5.0 06/07/2016 1933   GLUCOSEU >=500 (A) 06/07/2016 1933   HGBUR MODERATE (A) 06/07/2016 1933   BILIRUBINUR NEGATIVE 06/07/2016 1933   KETONESUR 5 (A) 06/07/2016 1933   PROTEINUR NEGATIVE 06/07/2016 1933   UROBILINOGEN 0.2 08/02/2012 1250   NITRITE POSITIVE (A) 06/07/2016 1933   LEUKOCYTESUR LARGE (A) 06/07/2016 1933   Sepsis Labs: @LABRCNTIP (procalcitonin:4,lacticidven:4) ) Recent Results (from the past 240 hour(s))  Culture, blood (Routine x 2)     Status: None (Preliminary result)   Collection Time: 06/07/16  8:13 PM  Result Value Ref Range Status   Specimen Description BLOOD RIGHT FOREARM  Final   Special Requests   Final    BOTTLES DRAWN AEROBIC AND ANAEROBIC Blood Culture results may not be optimal due to an inadequate volume of blood received in culture bottles   Culture NO GROWTH < 12 HOURS  Final   Report Status  PENDING  Incomplete  Culture, blood (Routine x 2)     Status: None (Preliminary result)   Collection Time: 06/07/16  8:13 PM  Result Value Ref Range Status   Specimen Description RIGHT ANTECUBITAL  Final   Special Requests   Final    BOTTLES DRAWN AEROBIC AND ANAEROBIC Blood Culture adequate volume   Culture NO GROWTH < 12 HOURS  Final   Report Status PENDING  Incomplete     Scheduled Meds: . allopurinol  100 mg Oral Daily  . aspirin EC  81 mg Oral Daily  . aztreonam  2 g Intravenous Q8H  . chlorproMAZINE  100 mg Oral QHS  . heparin  5,000 Units Subcutaneous Q8H  . insulin aspart  0-15 Units Subcutaneous TID WC  . insulin aspart  0-5 Units Subcutaneous QHS  . insulin aspart protamine- aspart  18 Units Subcutaneous BID WC  . insulin  glargine  30 Units Subcutaneous QHS  . levothyroxine  50 mcg Oral QAC breakfast  . mirtazapine  15 mg Oral QHS  . pantoprazole  40 mg Oral Daily  . [START ON 06/09/2016] pneumococcal 23 valent vaccine  0.5 mL Intramuscular Tomorrow-1000  . polyethylene glycol  17 g Oral Daily  . simvastatin  40 mg Oral QHS  . sodium chloride flush  3 mL Intravenous Q12H  . temazepam  15 mg Oral QHS   Continuous Infusions: . sodium chloride 75 mL/hr at 06/08/16 0239    Procedures/Studies: Dg Chest 2 View  Result Date: 06/07/2016 CLINICAL DATA:  Fever.  Cancer patient. EXAM: CHEST  2 VIEW COMPARISON:  05/26/2016 and 05/14/2016 FINDINGS: Power injectable Port-A-Cath tip:  Upper SVC. Thoracic spondylosis. Left axillary clips in the setting of prior left mastectomy. Atherosclerotic calcification of the aortic arch. No airspace opacity or pleural effusion. Slightly prominent pericardial adipose tissue as on prior exams, particularly along the cardiac apex. IMPRESSION: 1. No pneumonia or thoracic cause for the patient's fever is identified. 2.  Atherosclerotic calcification of the aortic arch. 3. Thoracic spondylosis. 4. Left mastectomy. Electronically Signed   By: Van Clines M.D.   On: 06/07/2016 20:01   Ct Chest W Contrast  Result Date: 05/14/2016 CLINICAL DATA:  Breast cancer staging status post mastectomy. Positive axillary lymph nodes. EXAM: CT CHEST, ABDOMEN, AND PELVIS WITH CONTRAST TECHNIQUE: Multidetector CT imaging of the chest, abdomen and pelvis was performed following the standard protocol during bolus administration of intravenous contrast. CONTRAST:  141mL ISOVUE-300 IOPAMIDOL (ISOVUE-300) INJECTION 61% COMPARISON:  CT scan of the chest December 27, 2011. FINDINGS: CT CHEST FINDINGS Cardiovascular: The thoracic aorta and central pulmonary arteries are normal in caliber. The heart is normal in size. No effusions. Mediastinum/Nodes: The patient is status post left mastectomy with a postoperative seroma measuring 7.1 x 3.2 cm. There is associated skin thickening which may be postsurgical in nature. Axillary clips are identified. A few normal lymph nodes are seen. Two rounded regions of increased attenuation adjacent to axillary clips are low in attenuation and most likely represent postsurgical changes rather than mildly prominent lymph nodes. The right axillary and bilateral retropectoral nodes are normal in appearance. The internal mammary lymph node chains are also normal in appearance. No adenopathy seen in the chest. The esophagus and thyroid are normal in appearance. Lungs/Pleura: Lungs are clear. No pleural effusion or pneumothorax. Musculoskeletal: No chest wall mass or suspicious bone lesions identified. CT ABDOMEN PELVIS FINDINGS Hepatobiliary: No focal liver abnormality is seen. Status post cholecystectomy. No biliary dilatation. Pancreas: Unremarkable. No pancreatic ductal dilatation or surrounding inflammatory changes. Spleen: Normal in size without focal abnormality. Adrenals/Urinary Tract: The right adrenal gland is normal. The left adrenal gland is mildly thickened without a discrete mass. There may be a tiny nodule superiorly on series 2, image  58 measuring 6 mm. There is a nonobstructive stone in the left kidney. There is probably a tiny cyst in the left kidney, too small to characterize. No hydronephrosis, ureterectasis, or ureteral stones. The left side of the bladder is mildly thick walled compared to the remainder. No focal mass. Stomach/Bowel: Surgical clips adjacent to the stomach are identified. The gastric wall is mildly prominent, probably due to poor distention. The small bowel is normal. The colon is unremarkable. The appendix is not seen consistent previous appendectomy. Vascular/Lymphatic: Atherosclerotic changes are seen in the non aneurysmal aorta. No adenopathy is identified within the abdomen. Reproductive: There is a left adnexal cyst  measuring 2.2 cm. The uterus and right ovary are normal. Other: The patient appears to be status post mesh hernia repair which is calcified. There is fluid just posterior to the mesh. No free air or other evidence of ascites. Musculoskeletal: No acute or significant osseous findings. IMPRESSION: 1. Left mastectomy changes with a seroma. Rounded regions of low attenuation in the axilla adjacent to surgical clips are favored to be postsurgical changes rather than mildly prominent lymph nodes. 2. The left adrenal gland is mildly thickened, likely due to hyperplasia. A tiny 6 mm nodule is likely a small adenoma. 3. Atherosclerotic changes in a non aneurysmal aorta. 4. Anterior hernia mesh is calcified. Fluid just deep to the hernia mesh is likely not acute and of doubtful acute significance. Electronically Signed   By: Dorise Bullion III M.D   On: 05/14/2016 10:13   Ct Abdomen Pelvis W Contrast  Result Date: 05/14/2016 CLINICAL DATA:  Breast cancer staging status post mastectomy. Positive axillary lymph nodes. EXAM: CT CHEST, ABDOMEN, AND PELVIS WITH CONTRAST TECHNIQUE: Multidetector CT imaging of the chest, abdomen and pelvis was performed following the standard protocol during bolus administration of  intravenous contrast. CONTRAST:  177mL ISOVUE-300 IOPAMIDOL (ISOVUE-300) INJECTION 61% COMPARISON:  CT scan of the chest December 27, 2011. FINDINGS: CT CHEST FINDINGS Cardiovascular: The thoracic aorta and central pulmonary arteries are normal in caliber. The heart is normal in size. No effusions. Mediastinum/Nodes: The patient is status post left mastectomy with a postoperative seroma measuring 7.1 x 3.2 cm. There is associated skin thickening which may be postsurgical in nature. Axillary clips are identified. A few normal lymph nodes are seen. Two rounded regions of increased attenuation adjacent to axillary clips are low in attenuation and most likely represent postsurgical changes rather than mildly prominent lymph nodes. The right axillary and bilateral retropectoral nodes are normal in appearance. The internal mammary lymph node chains are also normal in appearance. No adenopathy seen in the chest. The esophagus and thyroid are normal in appearance. Lungs/Pleura: Lungs are clear. No pleural effusion or pneumothorax. Musculoskeletal: No chest wall mass or suspicious bone lesions identified. CT ABDOMEN PELVIS FINDINGS Hepatobiliary: No focal liver abnormality is seen. Status post cholecystectomy. No biliary dilatation. Pancreas: Unremarkable. No pancreatic ductal dilatation or surrounding inflammatory changes. Spleen: Normal in size without focal abnormality. Adrenals/Urinary Tract: The right adrenal gland is normal. The left adrenal gland is mildly thickened without a discrete mass. There may be a tiny nodule superiorly on series 2, image 58 measuring 6 mm. There is a nonobstructive stone in the left kidney. There is probably a tiny cyst in the left kidney, too small to characterize. No hydronephrosis, ureterectasis, or ureteral stones. The left side of the bladder is mildly thick walled compared to the remainder. No focal mass. Stomach/Bowel: Surgical clips adjacent to the stomach are identified. The gastric  wall is mildly prominent, probably due to poor distention. The small bowel is normal. The colon is unremarkable. The appendix is not seen consistent previous appendectomy. Vascular/Lymphatic: Atherosclerotic changes are seen in the non aneurysmal aorta. No adenopathy is identified within the abdomen. Reproductive: There is a left adnexal cyst measuring 2.2 cm. The uterus and right ovary are normal. Other: The patient appears to be status post mesh hernia repair which is calcified. There is fluid just posterior to the mesh. No free air or other evidence of ascites. Musculoskeletal: No acute or significant osseous findings. IMPRESSION: 1. Left mastectomy changes with a seroma. Rounded regions of low attenuation in  the axilla adjacent to surgical clips are favored to be postsurgical changes rather than mildly prominent lymph nodes. 2. The left adrenal gland is mildly thickened, likely due to hyperplasia. A tiny 6 mm nodule is likely a small adenoma. 3. Atherosclerotic changes in a non aneurysmal aorta. 4. Anterior hernia mesh is calcified. Fluid just deep to the hernia mesh is likely not acute and of doubtful acute significance. Electronically Signed   By: Dorise Bullion III M.D   On: 05/14/2016 10:13   Dg Chest Port 1 View  Result Date: 05/26/2016 CLINICAL DATA:  Port-A-Cath placement.  Breast cancer. EXAM: PORTABLE CHEST 1 VIEW COMPARISON:  04/12/2016 chest radiograph. FINDINGS: Surgical clips overlie the left axilla. Right subclavian MediPort terminates in the upper third of the superior vena cava. Stable cardiomediastinal silhouette with normal heart size and aortic atherosclerosis. No pneumothorax. No pleural effusion. No overt pulmonary edema. No acute consolidative airspace disease. IMPRESSION: 1. No pneumothorax. 2. Right subclavian Port-A-Cath with tip in the upper third of the SCC. 3. No active cardiopulmonary disease. 4. Aortic atherosclerosis. Electronically Signed   By: Ilona Sorrel M.D.   On:  05/26/2016 11:26   Dg C-arm 1-60 Min-no Report  Result Date: 05/26/2016 Fluoroscopy was utilized by the requesting physician.  No radiographic interpretation.    Obi Scrima, DO  [facility] Pager [phone]  If 7PM-7AM, please contact night-coverage [email or web address] Password [email or web address] 06/08/2016, 3:27 PM   LOS: 0 days

## 2016-06-09 LAB — GLUCOSE, CAPILLARY
GLUCOSE-CAPILLARY: 262 mg/dL — AB (ref 65–99)
GLUCOSE-CAPILLARY: 295 mg/dL — AB (ref 65–99)
GLUCOSE-CAPILLARY: 170 mg/dL — AB (ref 65–99)
Glucose-Capillary: 251 mg/dL — ABNORMAL HIGH (ref 65–99)

## 2016-06-09 LAB — CBC WITH DIFFERENTIAL/PLATELET
BAND NEUTROPHILS: 14 %
BLASTS: 0 %
Basophils Absolute: 0 10*3/uL (ref 0.0–0.1)
Basophils Relative: 0 %
Eosinophils Absolute: 0 10*3/uL (ref 0.0–0.7)
Eosinophils Relative: 0 %
HCT: 35 % — ABNORMAL LOW (ref 36.0–46.0)
Hemoglobin: 11.5 g/dL — ABNORMAL LOW (ref 12.0–15.0)
LYMPHS PCT: 24 %
Lymphs Abs: 8 10*3/uL — ABNORMAL HIGH (ref 0.7–4.0)
MCH: 28 pg (ref 26.0–34.0)
MCHC: 32.9 g/dL (ref 30.0–36.0)
MCV: 85.2 fL (ref 78.0–100.0)
MONOS PCT: 8 %
Metamyelocytes Relative: 5 %
Monocytes Absolute: 2.7 10*3/uL — ABNORMAL HIGH (ref 0.1–1.0)
Myelocytes: 4 %
NEUTROS ABS: 22.7 10*3/uL — AB (ref 1.7–7.7)
Neutrophils Relative %: 44 %
OTHER: 0 %
PLATELETS: 207 10*3/uL (ref 150–400)
Promyelocytes Absolute: 1 %
RBC: 4.11 MIL/uL (ref 3.87–5.11)
RDW: 15.4 % (ref 11.5–15.5)
WBC: 33.4 10*3/uL — AB (ref 4.0–10.5)
nRBC: 0 /100 WBC

## 2016-06-09 LAB — URINE CULTURE

## 2016-06-09 LAB — BASIC METABOLIC PANEL
Anion gap: 9 (ref 5–15)
BUN: 10 mg/dL (ref 6–20)
CHLORIDE: 104 mmol/L (ref 101–111)
CO2: 23 mmol/L (ref 22–32)
Calcium: 8.2 mg/dL — ABNORMAL LOW (ref 8.9–10.3)
Creatinine, Ser: 0.9 mg/dL (ref 0.44–1.00)
Glucose, Bld: 303 mg/dL — ABNORMAL HIGH (ref 65–99)
POTASSIUM: 3.6 mmol/L (ref 3.5–5.1)
SODIUM: 136 mmol/L (ref 135–145)

## 2016-06-09 NOTE — Progress Notes (Signed)
PROGRESS NOTE  Margaret Mcdaniel GOT:[id number] DOB: 05-29-1950 DOA: 06/07/2016 PCP: Wende Neighbors, MD  Brief History:  66 year old female with a history of left sided breast cancer, parkinsonism, diabetes mellitus, anxiety and hypothyroidism presents with bilateral lower back pain of 3 days duration.  The patient also has developed fevers and chills up to 102.57F at home. She has also noticed gradually increasing CBGs over the last 3 days at home. She has been taking ibuprofen at home for her fevers and pain. She normally takes ibuprofen up to 10 tablets a week. The patient had some nausea without emesis. Regarding her breast cancer, she was diagnosed on 04/14/2016 with her last chemotherapy on 05/31/2016. In the emergency room, patient was noted have WBC 16.5 with urinalysis showing TNTC WBC. She was started on ciprofloxacin for presumptive pyelonephritis.  Assessment/Plan: Sepsis -Secondary to pyelonephritis -Continue IV fluids -Patient presented with fever, tachycardia, and leukocytosis -Lactic acid 1.8 -Continue IV antibiotics -Blood cultures have shown no growth to date  Pyelonephritis -The patient has a listed reaction of anaphylaxis to cephalexin -With the patient's rising WBC and minimal improvement, d/c cipro (suspect resistance due to prior exposure) -started aztreonam  -started vancomycin to empirically cover for Enterococcus pending cultures data -CT abd/pelvis done shows constipation, possible rectal prolapse, essentially stable appearance of fluid collection along a calcified anterior abdominal wall mesh, spigelian hernia containing small knuckle of colon without complicating features -Reviewed with general surgery, and no indication that CT findings would cause her current presentation of sepsis. We will continue with intravenous antibiotics for now and observe. If she fails to improve, can consider further workup.  Diarrhea -pt having 8-10 loose BMs/day since  admission -C. difficile negative. CT scan shows constipation. Continue bowel regimen.  CKD stage 2 to 3 -Baseline creatinine 1.0-1.3 -Creatinine stable at 0.9.  Diabetes Mellitus type 2 -elevated CBGs secondary to infectious process Hemoglobin A1c -Continue Lantus 30 units daily -Continue 70/30--18 units with meals -Changed to resistance sliding scale -Blood sugars are stable  Left breast cancer with metastasis to LN -Diagnosed 04/14/2016 -Last chemotherapy therapy 05/31/2016 -Follow up Dr. Talbert Cage outpt  Parkinson's Disease -patient requested outpt neurologist -referral made to movement disorder specialist in [place]  Anxiety/depression -Continue home dose Remeron, lorazepam  Hyperlipidemia -Continue statin  Hypothyroidism -Continue Synthroid -TSH 3.810    Disposition Plan:   Home in 2-3 days  Family Communication:  Discussed with daughter over the phone  Consultants:    Code Status:  FULL   DVT Prophylaxis:  [place] Heparin    Procedures: As Listed in Progress Note Above  Antibiotics: cipro 4/2>>4/3 vanco 4/3>>> Aztreonam 4/3>>>    Subjective: Continues to have lower back pain. Reports having frequent stools. Overall does not feel well.  Objective: Vitals:   06/08/16 2200 06/09/16 0134 06/09/16 0455 06/09/16 1452  BP: 128/79  118/73 129/75  Pulse: (!) 101  (!) 105 94  Resp: 20  20 20   Temp: 100.1 F (37.8 C) 98.7 F (37.1 C) 97.9 F (36.6 C) 99.1 F (37.3 C)  TempSrc: Oral Oral Oral Oral  SpO2: 96%  95% 96%  Weight:      Height:        Intake/Output Summary (Last 24 hours) at 06/09/16 1833 Last data filed at 06/09/16 1819  Gross per 24 hour  Intake          1171.25 ml  Output  2350 ml  Net         -1178.75 ml   Weight change:  Exam:   General:  Pt is alert, follows commands appropriately, not in acute distress  HEENT: No icterus, No thrush, No neck mass, [place]/AT  Cardiovascular: RRR, S1/S2, no rubs, no gallops  Respiratory:  CTA bilaterally, no wheezing, no crackles, no rhonchi  Abdomen: Soft/+BS, non tender, non distended, no guarding+ CVA tender  Extremities: No edema, No lymphangitis, No petechiae, No rashes, no synovitis   Data Reviewed: I have personally reviewed following labs and imaging studies Basic Metabolic Panel:  Recent Labs Lab 06/07/16 0934 06/07/16 2010 06/08/16 0627 06/09/16 0603  NA 131* 132* 137 136  K 3.9 3.7 3.3* 3.6  CL 99* 98* 105 104  CO2 18* 20* 24 23  GLUCOSE 490* 267* 269* 303*  BUN 20 22* 18 10  CREATININE 1.26* 1.33* 1.03* 0.90  CALCIUM 8.6* 9.2 8.1* 8.2*   Liver Function Tests:  Recent Labs Lab 06/07/16 0934 06/07/16 2010  AST 36 29  ALT 27 29  ALKPHOS 84 87  BILITOT 0.6 0.6  PROT 6.3* 6.6  ALBUMIN 3.2* 3.5   No results for input(s): LIPASE, AMYLASE in the last 168 hours. No results for input(s): AMMONIA in the last 168 hours. Coagulation Profile:  Recent Labs Lab 06/07/16 2010  INR 1.05   CBC:  Recent Labs Lab 06/07/16 0934 06/07/16 2010 06/08/16 0627 06/09/16 0603  WBC 8.7 16.5* 20.2* 33.4*  NEUTROABS 2.2 6.1  --  22.7*  HGB 12.9 13.0 12.0 11.5*  HCT 39.4 38.6 35.6* 35.0*  MCV 85.1 83.9 84.4 85.2  PLT 185 211 205 207   Cardiac Enzymes: No results for input(s): CKTOTAL, CKMB, CKMBINDEX, TROPONINI in the last 168 hours. BNP: Invalid input(s): POCBNP CBG:  Recent Labs Lab 06/08/16 1702 06/08/16 2205 06/09/16 0748 06/09/16 1130 06/09/16 1635  GLUCAP 276* 157* 262* 295* 251*   HbA1C: No results for input(s): HGBA1C in the last 72 hours. Urine analysis:    Component Value Date/Time   COLORURINE YELLOW 06/07/2016 1933   APPEARANCEUR CLOUDY (A) 06/07/2016 1933   LABSPEC 1.016 06/07/2016 1933   PHURINE 5.0 06/07/2016 1933   GLUCOSEU >=500 (A) 06/07/2016 1933   HGBUR MODERATE (A) 06/07/2016 1933   BILIRUBINUR NEGATIVE 06/07/2016 1933   KETONESUR 5 (A) 06/07/2016 1933   PROTEINUR NEGATIVE 06/07/2016 1933   UROBILINOGEN 0.2  08/02/2012 1250   NITRITE POSITIVE (A) 06/07/2016 1933   LEUKOCYTESUR LARGE (A) 06/07/2016 1933   Sepsis Labs: @LABRCNTIP (procalcitonin:4,lacticidven:4) ) Recent Results (from the past 240 hour(s))  Urine culture     Status: Abnormal   Collection Time: 06/07/16  7:33 PM  Result Value Ref Range Status   Specimen Description URINE, CLEAN CATCH  Final   Special Requests NONE  Final   Culture MULTIPLE SPECIES PRESENT, SUGGEST RECOLLECTION (A)  Final   Report Status 06/09/2016 FINAL  Final  Culture, blood (Routine x 2)     Status: None (Preliminary result)   Collection Time: 06/07/16  8:13 PM  Result Value Ref Range Status   Specimen Description BLOOD RIGHT FOREARM  Final   Special Requests   Final    BOTTLES DRAWN AEROBIC AND ANAEROBIC Blood Culture results may not be optimal due to an inadequate volume of blood received in culture bottles   Culture NO GROWTH 2 DAYS  Final   Report Status PENDING  Incomplete  Culture, blood (Routine x 2)     Status: None (Preliminary result)  Collection Time: 06/07/16  8:13 PM  Result Value Ref Range Status   Specimen Description RIGHT ANTECUBITAL  Final   Special Requests   Final    BOTTLES DRAWN AEROBIC AND ANAEROBIC Blood Culture adequate volume   Culture NO GROWTH 2 DAYS  Final   Report Status PENDING  Incomplete  C difficile quick scan w PCR reflex     Status: None   Collection Time: 06/08/16  6:10 PM  Result Value Ref Range Status   C Diff antigen NEGATIVE NEGATIVE Final   C Diff toxin NEGATIVE NEGATIVE Final   C Diff interpretation No C. difficile detected.  Final     Scheduled Meds: . allopurinol  100 mg Oral Daily  . aspirin EC  81 mg Oral Daily  . aztreonam  2 g Intravenous Q8H  . chlorproMAZINE  100 mg Oral QHS  . heparin  5,000 Units Subcutaneous Q8H  . insulin aspart  0-15 Units Subcutaneous TID WC  . insulin aspart  0-5 Units Subcutaneous QHS  . insulin aspart protamine- aspart  18 Units Subcutaneous BID WC  . insulin  glargine  30 Units Subcutaneous QHS  . levothyroxine  50 mcg Oral QAC breakfast  . mirtazapine  15 mg Oral QHS  . pantoprazole  40 mg Oral Daily  . polyethylene glycol  17 g Oral Daily  . simvastatin  40 mg Oral QHS  . sodium chloride flush  3 mL Intravenous Q12H  . temazepam  15 mg Oral QHS  . vancomycin  1,000 mg Intravenous Q12H   Continuous Infusions: . sodium chloride 75 mL/hr at 06/09/16 1751    Procedures/Studies: Dg Chest 2 View  Result Date: 06/07/2016 CLINICAL DATA:  Fever.  Cancer patient. EXAM: CHEST  2 VIEW COMPARISON:  05/26/2016 and 05/14/2016 FINDINGS: Power injectable Port-A-Cath tip:  Upper SVC. Thoracic spondylosis. Left axillary clips in the setting of prior left mastectomy. Atherosclerotic calcification of the aortic arch. No airspace opacity or pleural effusion. Slightly prominent pericardial adipose tissue as on prior exams, particularly along the cardiac apex. IMPRESSION: 1. No pneumonia or thoracic cause for the patient's fever is identified. 2.  Atherosclerotic calcification of the aortic arch. 3. Thoracic spondylosis. 4. Left mastectomy. Electronically Signed   By: Van Clines M.D.   On: 06/07/2016 20:01   Ct Chest W Contrast  Result Date: 05/14/2016 CLINICAL DATA:  Breast cancer staging status post mastectomy. Positive axillary lymph nodes. EXAM: CT CHEST, ABDOMEN, AND PELVIS WITH CONTRAST TECHNIQUE: Multidetector CT imaging of the chest, abdomen and pelvis was performed following the standard protocol during bolus administration of intravenous contrast. CONTRAST:  161mL ISOVUE-300 IOPAMIDOL (ISOVUE-300) INJECTION 61% COMPARISON:  CT scan of the chest December 27, 2011. FINDINGS: CT CHEST FINDINGS Cardiovascular: The thoracic aorta and central pulmonary arteries are normal in caliber. The heart is normal in size. No effusions. Mediastinum/Nodes: The patient is status post left mastectomy with a postoperative seroma measuring 7.1 x 3.2 cm. There is associated skin  thickening which may be postsurgical in nature. Axillary clips are identified. A few normal lymph nodes are seen. Two rounded regions of increased attenuation adjacent to axillary clips are low in attenuation and most likely represent postsurgical changes rather than mildly prominent lymph nodes. The right axillary and bilateral retropectoral nodes are normal in appearance. The internal mammary lymph node chains are also normal in appearance. No adenopathy seen in the chest. The esophagus and thyroid are normal in appearance. Lungs/Pleura: Lungs are clear. No pleural effusion or pneumothorax. Musculoskeletal:  No chest wall mass or suspicious bone lesions identified. CT ABDOMEN PELVIS FINDINGS Hepatobiliary: No focal liver abnormality is seen. Status post cholecystectomy. No biliary dilatation. Pancreas: Unremarkable. No pancreatic ductal dilatation or surrounding inflammatory changes. Spleen: Normal in size without focal abnormality. Adrenals/Urinary Tract: The right adrenal gland is normal. The left adrenal gland is mildly thickened without a discrete mass. There may be a tiny nodule superiorly on series 2, image 58 measuring 6 mm. There is a nonobstructive stone in the left kidney. There is probably a tiny cyst in the left kidney, too small to characterize. No hydronephrosis, ureterectasis, or ureteral stones. The left side of the bladder is mildly thick walled compared to the remainder. No focal mass. Stomach/Bowel: Surgical clips adjacent to the stomach are identified. The gastric wall is mildly prominent, probably due to poor distention. The small bowel is normal. The colon is unremarkable. The appendix is not seen consistent previous appendectomy. Vascular/Lymphatic: Atherosclerotic changes are seen in the non aneurysmal aorta. No adenopathy is identified within the abdomen. Reproductive: There is a left adnexal cyst measuring 2.2 cm. The uterus and right ovary are normal. Other: The patient appears to be  status post mesh hernia repair which is calcified. There is fluid just posterior to the mesh. No free air or other evidence of ascites. Musculoskeletal: No acute or significant osseous findings. IMPRESSION: 1. Left mastectomy changes with a seroma. Rounded regions of low attenuation in the axilla adjacent to surgical clips are favored to be postsurgical changes rather than mildly prominent lymph nodes. 2. The left adrenal gland is mildly thickened, likely due to hyperplasia. A tiny 6 mm nodule is likely a small adenoma. 3. Atherosclerotic changes in a non aneurysmal aorta. 4. Anterior hernia mesh is calcified. Fluid just deep to the hernia mesh is likely not acute and of doubtful acute significance. Electronically Signed   By: Dorise Bullion III M.D   On: 05/14/2016 10:13   Ct Abdomen Pelvis W Contrast  Result Date: 06/09/2016 CLINICAL DATA:  Low back pain for 4 days.  Diarrhea. EXAM: CT ABDOMEN AND PELVIS WITH CONTRAST TECHNIQUE: Multidetector CT imaging of the abdomen and pelvis was performed using the standard protocol following bolus administration of intravenous contrast. CONTRAST:  147mL ISOVUE-300 IOPAMIDOL (ISOVUE-300) INJECTION 61% COMPARISON:  05/14/2016 FINDINGS: Lower chest: Descending thoracic aortic atherosclerotic calcification. Hepatobiliary: Somewhat low-density liver, possible steatosis. Cholecystectomy. No specific focal liver lesion is identified. Pancreas: Unremarkable Spleen: Unremarkable Adrenals/Urinary Tract: Mild scarring in the left kidney lower pole anteriorly. Several nonobstructive left renal calculi measuring up to 3 mm in diameter in the mid kidney. Slightly dilated right mid ureteral segment without obvious cause. Adrenal glands unremarkable. Stomach/Bowel: Postoperative findings along the anterior margin of the stomach. Small right Spigelian hernia with a knuckle of ascending colon extending into the hernia on image 35/2, without findings complicating feature. There is formed  stool in the sigmoid colon rectum although there is also a small amount of fluid in the rectal vault distally near the anorectal junction. Vascular/Lymphatic: Peripancreatic node 1 cm in short axis on image 24/2. Small periaortic lymph nodes are not enlarged. Aortoiliac atherosclerotic vascular disease. Reproductive: Unremarkable Other: No supplemental non-categorized findings. Musculoskeletal: Ventral density suggesting possible hernia mesh along the anterior abdominal wall with fluid collection along its margin as on image 37/2, not appreciably changed from the earlier exam. Absent coccyx with a flat mesh in the expected location of the coccyx as on image 68/5, with pelvic floor laxity and potentially mild rectal prolapse.  L5 is partially sacralized. Facet arthropathy and intervertebral spurring in the lumbar spine along with suspected degenerative disc disease at L3-4 and L4-5 potentially causing mild levels of impingement. IMPRESSION: 1. Possible rectal prolapse with pelvic floor laxity. This is at least partially related to the absence of the coccyx and the posterior mesh implant in the vicinity of the coccyx. In the rectal vault there is both formed stool and some fluid, along with a potentially patulous anorectal junction. 2. Essentially stable appearance of fluid collection along a calcified anterior abdominal wall mesh. 3. Spigelian hernia on the right contains a small knuckle of colon, without current evidence of complicating feature. 4. Suspected hepatic steatosis. 5.  Aortoiliac atherosclerotic vascular disease. 6. Potential mild levels of impingement in the lumbar spine due to spondylosis and degenerative disc disease. 7. Nonobstructive left nephrolithiasis. Mild scarring in the left kidney lower pole anteriorly. Electronically Signed   By: Van Clines M.D.   On: 06/09/2016 07:33   Ct Abdomen Pelvis W Contrast  Result Date: 05/14/2016 CLINICAL DATA:  Breast cancer staging status post  mastectomy. Positive axillary lymph nodes. EXAM: CT CHEST, ABDOMEN, AND PELVIS WITH CONTRAST TECHNIQUE: Multidetector CT imaging of the chest, abdomen and pelvis was performed following the standard protocol during bolus administration of intravenous contrast. CONTRAST:  177mL ISOVUE-300 IOPAMIDOL (ISOVUE-300) INJECTION 61% COMPARISON:  CT scan of the chest December 27, 2011. FINDINGS: CT CHEST FINDINGS Cardiovascular: The thoracic aorta and central pulmonary arteries are normal in caliber. The heart is normal in size. No effusions. Mediastinum/Nodes: The patient is status post left mastectomy with a postoperative seroma measuring 7.1 x 3.2 cm. There is associated skin thickening which may be postsurgical in nature. Axillary clips are identified. A few normal lymph nodes are seen. Two rounded regions of increased attenuation adjacent to axillary clips are low in attenuation and most likely represent postsurgical changes rather than mildly prominent lymph nodes. The right axillary and bilateral retropectoral nodes are normal in appearance. The internal mammary lymph node chains are also normal in appearance. No adenopathy seen in the chest. The esophagus and thyroid are normal in appearance. Lungs/Pleura: Lungs are clear. No pleural effusion or pneumothorax. Musculoskeletal: No chest wall mass or suspicious bone lesions identified. CT ABDOMEN PELVIS FINDINGS Hepatobiliary: No focal liver abnormality is seen. Status post cholecystectomy. No biliary dilatation. Pancreas: Unremarkable. No pancreatic ductal dilatation or surrounding inflammatory changes. Spleen: Normal in size without focal abnormality. Adrenals/Urinary Tract: The right adrenal gland is normal. The left adrenal gland is mildly thickened without a discrete mass. There may be a tiny nodule superiorly on series 2, image 58 measuring 6 mm. There is a nonobstructive stone in the left kidney. There is probably a tiny cyst in the left kidney, too small to  characterize. No hydronephrosis, ureterectasis, or ureteral stones. The left side of the bladder is mildly thick walled compared to the remainder. No focal mass. Stomach/Bowel: Surgical clips adjacent to the stomach are identified. The gastric wall is mildly prominent, probably due to poor distention. The small bowel is normal. The colon is unremarkable. The appendix is not seen consistent previous appendectomy. Vascular/Lymphatic: Atherosclerotic changes are seen in the non aneurysmal aorta. No adenopathy is identified within the abdomen. Reproductive: There is a left adnexal cyst measuring 2.2 cm. The uterus and right ovary are normal. Other: The patient appears to be status post mesh hernia repair which is calcified. There is fluid just posterior to the mesh. No free air or other evidence of ascites. Musculoskeletal:  No acute or significant osseous findings. IMPRESSION: 1. Left mastectomy changes with a seroma. Rounded regions of low attenuation in the axilla adjacent to surgical clips are favored to be postsurgical changes rather than mildly prominent lymph nodes. 2. The left adrenal gland is mildly thickened, likely due to hyperplasia. A tiny 6 mm nodule is likely a small adenoma. 3. Atherosclerotic changes in a non aneurysmal aorta. 4. Anterior hernia mesh is calcified. Fluid just deep to the hernia mesh is likely not acute and of doubtful acute significance. Electronically Signed   By: Dorise Bullion III M.D   On: 05/14/2016 10:13   Dg Chest Port 1 View  Result Date: 05/26/2016 CLINICAL DATA:  Port-A-Cath placement.  Breast cancer. EXAM: PORTABLE CHEST 1 VIEW COMPARISON:  04/12/2016 chest radiograph. FINDINGS: Surgical clips overlie the left axilla. Right subclavian MediPort terminates in the upper third of the superior vena cava. Stable cardiomediastinal silhouette with normal heart size and aortic atherosclerosis. No pneumothorax. No pleural effusion. No overt pulmonary edema. No acute consolidative  airspace disease. IMPRESSION: 1. No pneumothorax. 2. Right subclavian Port-A-Cath with tip in the upper third of the SCC. 3. No active cardiopulmonary disease. 4. Aortic atherosclerosis. Electronically Signed   By: Ilona Sorrel M.D.   On: 05/26/2016 11:26   Dg C-arm 1-60 Min-no Report  Result Date: 05/26/2016 Fluoroscopy was utilized by the requesting physician.  No radiographic interpretation.    Eustace Hur,   [facility] Pager [phone]  If 7PM-7AM, please contact night-coverage [email or web address] Password [email or web address] 06/09/2016, 6:33 PM   LOS: 1 day

## 2016-06-09 NOTE — Progress Notes (Signed)
Inpatient Diabetes Program Recommendations  AACE/ADA: New Consensus Statement on Inpatient Glycemic Control (2015)  Target Ranges:  Prepandial:   less than 140 mg/dL      Peak postprandial:   less than 180 mg/dL (1-2 hours)      Critically ill patients:  140 - 180 mg/dL   Results for MESHA, SCHAMBERGER (MRN [id number]) as of 06/09/2016 09:57  Ref. Range 06/08/2016 07:47 06/08/2016 11:50 06/08/2016 17:02 06/08/2016 22:05 06/09/2016 07:48  Glucose-Capillary Latest Ref Range: 65 - 99 mg/dL 235 (H) 314 (H) 276 (H) 157 (H) 262 (H)   Review of Glycemic Control  Current orders for Inpatient glycemic control: Lantus 30 units QHS, 70/30 18 units BID, Novolog 0-15 units TID with meals, Novolog 0-5 units QHS  Inpatient Diabetes Program Recommendations: Insulin - Basal: Please consider increasing Lantus to 35 units QHS. Please consider increasing 70/30 to 22 units BID.  Thanks, Barnie Alderman, RN, MSN, CDE Diabetes Coordinator Inpatient Diabetes Program [phone] (Team Pager from 8am to 5pm)

## 2016-06-10 LAB — GLUCOSE, CAPILLARY
GLUCOSE-CAPILLARY: 315 mg/dL — AB (ref 65–99)
Glucose-Capillary: 189 mg/dL — ABNORMAL HIGH (ref 65–99)
Glucose-Capillary: 284 mg/dL — ABNORMAL HIGH (ref 65–99)
Glucose-Capillary: 210 mg/dL — ABNORMAL HIGH (ref 65–99)

## 2016-06-10 LAB — BASIC METABOLIC PANEL
Anion gap: 7 (ref 5–15)
BUN: 9 mg/dL (ref 6–20)
CHLORIDE: 108 mmol/L (ref 101–111)
CO2: 25 mmol/L (ref 22–32)
CREATININE: 0.83 mg/dL (ref 0.44–1.00)
Calcium: 8.2 mg/dL — ABNORMAL LOW (ref 8.9–10.3)
GFR calc non Af Amer: >60 mL/min (ref >60)
Glucose, Bld: 187 mg/dL — ABNORMAL HIGH (ref 65–99)
POTASSIUM: 3.9 mmol/L (ref 3.5–5.1)
Sodium: 140 mmol/L (ref 135–145)

## 2016-06-10 LAB — CBC
HEMATOCRIT: 35.5 % — AB (ref 36.0–46.0)
HEMOGLOBIN: 11.6 g/dL — AB (ref 12.0–15.0)
MCH: 28.2 pg (ref 26.0–34.0)
MCHC: 32.7 g/dL (ref 30.0–36.0)
MCV: 86.4 fL (ref 78.0–100.0)
PLATELETS: 185 10*3/uL (ref 150–400)
RBC: 4.11 MIL/uL (ref 3.87–5.11)
RDW: 15.6 % — ABNORMAL HIGH (ref 11.5–15.5)
WBC: 31.4 10*3/uL — AB (ref 4.0–10.5)

## 2016-06-10 NOTE — Progress Notes (Signed)
PROGRESS NOTE  Margaret Mcdaniel JZP:[id number] DOB: Jul 31, 1950 DOA: 06/07/2016 PCP: Wende Neighbors, MD  Brief History:  66 year old female with a history of left sided breast cancer, parkinsonism, diabetes mellitus, anxiety and hypothyroidism presents with bilateral lower back pain of 3 days duration.  The patient also has developed fevers and chills up to 102.70F at home. She has also noticed gradually increasing CBGs over the last 3 days at home. She has been taking ibuprofen at home for her fevers and pain. She normally takes ibuprofen up to 10 tablets a week. The patient had some nausea without emesis. Regarding her breast cancer, she was diagnosed on 04/14/2016 with her last chemotherapy on 05/31/2016. In the emergency room, patient was noted have WBC 16.5 with urinalysis showing TNTC WBC. She was started on ciprofloxacin for presumptive pyelonephritis.  Assessment/Plan: Sepsis -Secondary to pyelonephritis -Continue IV fluids -Patient presented with fever, tachycardia, and leukocytosis -Lactic acid 1.8 -Continue IV antibiotics -Blood cultures have shown no growth to date - she is no longer having high fevers and leukocytosis may have plateaued  Pyelonephritis -The patient has a listed reaction of anaphylaxis to cephalexin -With the patient's rising WBC and minimal improvement, d/c cipro (suspect resistance due to prior exposure) -started aztreonam  -started vancomycin to empirically cover for Enterococcus pending cultures data -CT abd/pelvis done shows constipation, possible rectal prolapse, essentially stable appearance of fluid collection along a calcified anterior abdominal wall mesh, spigelian hernia containing small knuckle of colon without complicating features -Reviewed with general surgery, and no indication that CT findings would cause her current presentation of sepsis. We will continue with intravenous antibiotics for now and observe. If she fails to improve, can consider  further workup.  Diarrhea -pt having 8-10 loose BMs/day since admission -C. difficile negative. CT scan shows constipation. Continue bowel regimen.  CKD stage 2 to 3 -Baseline creatinine 1.0-1.3 -Creatinine stable at 0.9.  Diabetes Mellitus type 2 -elevated CBGs secondary to infectious process Hemoglobin A1c -Continue Lantus 30 units daily -Continue 70/30--18 units with meals -Changed to resistance sliding scale -Blood sugars are stable  Left breast cancer with metastasis to LN -Diagnosed 04/14/2016 -Last chemotherapy therapy 05/31/2016 -Follow up Dr. Talbert Cage outpt  Parkinson's Disease -patient requested outpt neurologist -referral made to movement disorder specialist in [place]  Anxiety/depression -Continue home dose Remeron, lorazepam  Hyperlipidemia -Continue statin  Hypothyroidism -Continue Synthroid -TSH 3.810    Disposition Plan:   Home in 2-3 days  Family Communication:  Discussed with daughter over the phone  Consultants:    Code Status:  FULL   DVT Prophylaxis:  [place] Heparin    Procedures: As Listed in Progress Note Above  Antibiotics: cipro 4/2>>4/3 vanco 4/3>>> Aztreonam 4/3>>>    Subjective: No abdominal pain. Still has loose stools. Still has back pain which she feels is different from her chronic back pain. Feels that current pain is "in her kidneys"  Objective: Vitals:   06/09/16 1452 06/09/16 2117 06/10/16 0620 06/10/16 0956  BP: 129/75 (!) 112/55 120/68   Pulse: 94 85 86   Resp: 20 18 15    Temp: 99.1 F (37.3 C) 98.9 F (37.2 C) 97.6 F (36.4 C)   TempSrc: Oral Oral Oral   SpO2: 96% 95% 95% 94%  Weight:      Height:        Intake/Output Summary (Last 24 hours) at 06/10/16 1413 Last data filed at 06/10/16 1328  Gross per 24 hour  Intake  3527.5 ml  Output             2250 ml  Net           1277.5 ml   Weight change:  Exam:   General:  Pt is alert, follows commands appropriately, not in acute distress  HEENT: No  icterus, No thrush, No neck mass, [place]/AT  Cardiovascular: RRR, S1/S2, no rubs, no gallops  Respiratory: CTA bilaterally, no wheezing, no crackles, no rhonchi  Abdomen: Soft/+BS, non tender, non distended, no guarding+ CVA tender  Extremities: No edema, No lymphangitis, No petechiae, No rashes, no synovitis   Data Reviewed: I have personally reviewed following labs and imaging studies Basic Metabolic Panel:  Recent Labs Lab 06/07/16 0934 06/07/16 2010 06/08/16 0627 06/09/16 0603 06/10/16 0607  NA 131* 132* 137 136 140  K 3.9 3.7 3.3* 3.6 3.9  CL 99* 98* 105 104 108  CO2 18* 20* 24 23 25   GLUCOSE 490* 267* 269* 303* 187*  BUN 20 22* 18 10 9   CREATININE 1.26* 1.33* 1.03* 0.90 0.83  CALCIUM 8.6* 9.2 8.1* 8.2* 8.2*   Liver Function Tests:  Recent Labs Lab 06/07/16 0934 06/07/16 2010  AST 36 29  ALT 27 29  ALKPHOS 84 87  BILITOT 0.6 0.6  PROT 6.3* 6.6  ALBUMIN 3.2* 3.5   No results for input(s): LIPASE, AMYLASE in the last 168 hours. No results for input(s): AMMONIA in the last 168 hours. Coagulation Profile:  Recent Labs Lab 06/07/16 2010  INR 1.05   CBC:  Recent Labs Lab 06/07/16 0934 06/07/16 2010 06/08/16 0627 06/09/16 0603 06/10/16 0607  WBC 8.7 16.5* 20.2* 33.4* 31.4*  NEUTROABS 2.2 6.1  --  22.7*  --   HGB 12.9 13.0 12.0 11.5* 11.6*  HCT 39.4 38.6 35.6* 35.0* 35.5*  MCV 85.1 83.9 84.4 85.2 86.4  PLT 185 211 205 207 185   Cardiac Enzymes: No results for input(s): CKTOTAL, CKMB, CKMBINDEX, TROPONINI in the last 168 hours. BNP: Invalid input(s): POCBNP CBG:  Recent Labs Lab 06/09/16 1130 06/09/16 1635 06/09/16 2104 06/10/16 0728 06/10/16 1132  GLUCAP 295* 251* 170* 189* 284*   HbA1C: No results for input(s): HGBA1C in the last 72 hours. Urine analysis:    Component Value Date/Time   COLORURINE YELLOW 06/07/2016 1933   APPEARANCEUR CLOUDY (A) 06/07/2016 1933   LABSPEC 1.016 06/07/2016 1933   PHURINE 5.0 06/07/2016 1933    GLUCOSEU >=500 (A) 06/07/2016 1933   HGBUR MODERATE (A) 06/07/2016 1933   BILIRUBINUR NEGATIVE 06/07/2016 1933   KETONESUR 5 (A) 06/07/2016 1933   PROTEINUR NEGATIVE 06/07/2016 1933   UROBILINOGEN 0.2 08/02/2012 1250   NITRITE POSITIVE (A) 06/07/2016 1933   LEUKOCYTESUR LARGE (A) 06/07/2016 1933   Sepsis Labs: @LABRCNTIP (procalcitonin:4,lacticidven:4) ) Recent Results (from the past 240 hour(s))  Urine culture     Status: Abnormal   Collection Time: 06/07/16  7:33 PM  Result Value Ref Range Status   Specimen Description URINE, CLEAN CATCH  Final   Special Requests NONE  Final   Culture MULTIPLE SPECIES PRESENT, SUGGEST RECOLLECTION (A)  Final   Report Status 06/09/2016 FINAL  Final  Culture, blood (Routine x 2)     Status: None (Preliminary result)   Collection Time: 06/07/16  8:13 PM  Result Value Ref Range Status   Specimen Description BLOOD RIGHT FOREARM  Final   Special Requests   Final    BOTTLES DRAWN AEROBIC AND ANAEROBIC Blood Culture results may not be optimal due to an  inadequate volume of blood received in culture bottles   Culture NO GROWTH 3 DAYS  Final   Report Status PENDING  Incomplete  Culture, blood (Routine x 2)     Status: None (Preliminary result)   Collection Time: 06/07/16  8:13 PM  Result Value Ref Range Status   Specimen Description RIGHT ANTECUBITAL  Final   Special Requests   Final    BOTTLES DRAWN AEROBIC AND ANAEROBIC Blood Culture adequate volume   Culture NO GROWTH 3 DAYS  Final   Report Status PENDING  Incomplete  C difficile quick scan w PCR reflex     Status: None   Collection Time: 06/08/16  6:10 PM  Result Value Ref Range Status   C Diff antigen NEGATIVE NEGATIVE Final   C Diff toxin NEGATIVE NEGATIVE Final   C Diff interpretation No C. difficile detected.  Final     Scheduled Meds: . allopurinol  100 mg Oral Daily  . aspirin EC  81 mg Oral Daily  . aztreonam  2 g Intravenous Q8H  . chlorproMAZINE  100 mg Oral QHS  . heparin  5,000  Units Subcutaneous Q8H  . insulin aspart  0-15 Units Subcutaneous TID WC  . insulin aspart  0-5 Units Subcutaneous QHS  . insulin aspart protamine- aspart  18 Units Subcutaneous BID WC  . insulin glargine  30 Units Subcutaneous QHS  . levothyroxine  50 mcg Oral QAC breakfast  . mirtazapine  15 mg Oral QHS  . pantoprazole  40 mg Oral Daily  . polyethylene glycol  17 g Oral Daily  . simvastatin  40 mg Oral QHS  . sodium chloride flush  3 mL Intravenous Q12H  . temazepam  15 mg Oral QHS  . vancomycin  1,000 mg Intravenous Q12H   Continuous Infusions: . sodium chloride 100 mL/hr at 06/09/16 1845    Procedures/Studies: Dg Chest 2 View  Result Date: 06/07/2016 CLINICAL DATA:  Fever.  Cancer patient. EXAM: CHEST  2 VIEW COMPARISON:  05/26/2016 and 05/14/2016 FINDINGS: Power injectable Port-A-Cath tip:  Upper SVC. Thoracic spondylosis. Left axillary clips in the setting of prior left mastectomy. Atherosclerotic calcification of the aortic arch. No airspace opacity or pleural effusion. Slightly prominent pericardial adipose tissue as on prior exams, particularly along the cardiac apex. IMPRESSION: 1. No pneumonia or thoracic cause for the patient's fever is identified. 2.  Atherosclerotic calcification of the aortic arch. 3. Thoracic spondylosis. 4. Left mastectomy. Electronically Signed   By: Van Clines M.D.   On: 06/07/2016 20:01   Ct Chest W Contrast  Result Date: 05/14/2016 CLINICAL DATA:  Breast cancer staging status post mastectomy. Positive axillary lymph nodes. EXAM: CT CHEST, ABDOMEN, AND PELVIS WITH CONTRAST TECHNIQUE: Multidetector CT imaging of the chest, abdomen and pelvis was performed following the standard protocol during bolus administration of intravenous contrast. CONTRAST:  13mL ISOVUE-300 IOPAMIDOL (ISOVUE-300) INJECTION 61% COMPARISON:  CT scan of the chest December 27, 2011. FINDINGS: CT CHEST FINDINGS Cardiovascular: The thoracic aorta and central pulmonary arteries are  normal in caliber. The heart is normal in size. No effusions. Mediastinum/Nodes: The patient is status post left mastectomy with a postoperative seroma measuring 7.1 x 3.2 cm. There is associated skin thickening which may be postsurgical in nature. Axillary clips are identified. A few normal lymph nodes are seen. Two rounded regions of increased attenuation adjacent to axillary clips are low in attenuation and most likely represent postsurgical changes rather than mildly prominent lymph nodes. The right axillary and bilateral retropectoral  nodes are normal in appearance. The internal mammary lymph node chains are also normal in appearance. No adenopathy seen in the chest. The esophagus and thyroid are normal in appearance. Lungs/Pleura: Lungs are clear. No pleural effusion or pneumothorax. Musculoskeletal: No chest wall mass or suspicious bone lesions identified. CT ABDOMEN PELVIS FINDINGS Hepatobiliary: No focal liver abnormality is seen. Status post cholecystectomy. No biliary dilatation. Pancreas: Unremarkable. No pancreatic ductal dilatation or surrounding inflammatory changes. Spleen: Normal in size without focal abnormality. Adrenals/Urinary Tract: The right adrenal gland is normal. The left adrenal gland is mildly thickened without a discrete mass. There may be a tiny nodule superiorly on series 2, image 58 measuring 6 mm. There is a nonobstructive stone in the left kidney. There is probably a tiny cyst in the left kidney, too small to characterize. No hydronephrosis, ureterectasis, or ureteral stones. The left side of the bladder is mildly thick walled compared to the remainder. No focal mass. Stomach/Bowel: Surgical clips adjacent to the stomach are identified. The gastric wall is mildly prominent, probably due to poor distention. The small bowel is normal. The colon is unremarkable. The appendix is not seen consistent previous appendectomy. Vascular/Lymphatic: Atherosclerotic changes are seen in the non  aneurysmal aorta. No adenopathy is identified within the abdomen. Reproductive: There is a left adnexal cyst measuring 2.2 cm. The uterus and right ovary are normal. Other: The patient appears to be status post mesh hernia repair which is calcified. There is fluid just posterior to the mesh. No free air or other evidence of ascites. Musculoskeletal: No acute or significant osseous findings. IMPRESSION: 1. Left mastectomy changes with a seroma. Rounded regions of low attenuation in the axilla adjacent to surgical clips are favored to be postsurgical changes rather than mildly prominent lymph nodes. 2. The left adrenal gland is mildly thickened, likely due to hyperplasia. A tiny 6 mm nodule is likely a small adenoma. 3. Atherosclerotic changes in a non aneurysmal aorta. 4. Anterior hernia mesh is calcified. Fluid just deep to the hernia mesh is likely not acute and of doubtful acute significance. Electronically Signed   By: Dorise Bullion III M.D   On: 05/14/2016 10:13   Ct Abdomen Pelvis W Contrast  Result Date: 06/09/2016 CLINICAL DATA:  Low back pain for 4 days.  Diarrhea. EXAM: CT ABDOMEN AND PELVIS WITH CONTRAST TECHNIQUE: Multidetector CT imaging of the abdomen and pelvis was performed using the standard protocol following bolus administration of intravenous contrast. CONTRAST:  168mL ISOVUE-300 IOPAMIDOL (ISOVUE-300) INJECTION 61% COMPARISON:  05/14/2016 FINDINGS: Lower chest: Descending thoracic aortic atherosclerotic calcification. Hepatobiliary: Somewhat low-density liver, possible steatosis. Cholecystectomy. No specific focal liver lesion is identified. Pancreas: Unremarkable Spleen: Unremarkable Adrenals/Urinary Tract: Mild scarring in the left kidney lower pole anteriorly. Several nonobstructive left renal calculi measuring up to 3 mm in diameter in the mid kidney. Slightly dilated right mid ureteral segment without obvious cause. Adrenal glands unremarkable. Stomach/Bowel: Postoperative findings  along the anterior margin of the stomach. Small right Spigelian hernia with a knuckle of ascending colon extending into the hernia on image 35/2, without findings complicating feature. There is formed stool in the sigmoid colon rectum although there is also a small amount of fluid in the rectal vault distally near the anorectal junction. Vascular/Lymphatic: Peripancreatic node 1 cm in short axis on image 24/2. Small periaortic lymph nodes are not enlarged. Aortoiliac atherosclerotic vascular disease. Reproductive: Unremarkable Other: No supplemental non-categorized findings. Musculoskeletal: Ventral density suggesting possible hernia mesh along the anterior abdominal wall with fluid collection  along its margin as on image 37/2, not appreciably changed from the earlier exam. Absent coccyx with a flat mesh in the expected location of the coccyx as on image 68/5, with pelvic floor laxity and potentially mild rectal prolapse. L5 is partially sacralized. Facet arthropathy and intervertebral spurring in the lumbar spine along with suspected degenerative disc disease at L3-4 and L4-5 potentially causing mild levels of impingement. IMPRESSION: 1. Possible rectal prolapse with pelvic floor laxity. This is at least partially related to the absence of the coccyx and the posterior mesh implant in the vicinity of the coccyx. In the rectal vault there is both formed stool and some fluid, along with a potentially patulous anorectal junction. 2. Essentially stable appearance of fluid collection along a calcified anterior abdominal wall mesh. 3. Spigelian hernia on the right contains a small knuckle of colon, without current evidence of complicating feature. 4. Suspected hepatic steatosis. 5.  Aortoiliac atherosclerotic vascular disease. 6. Potential mild levels of impingement in the lumbar spine due to spondylosis and degenerative disc disease. 7. Nonobstructive left nephrolithiasis. Mild scarring in the left kidney lower pole  anteriorly. Electronically Signed   By: Van Clines M.D.   On: 06/09/2016 07:33   Ct Abdomen Pelvis W Contrast  Result Date: 05/14/2016 CLINICAL DATA:  Breast cancer staging status post mastectomy. Positive axillary lymph nodes. EXAM: CT CHEST, ABDOMEN, AND PELVIS WITH CONTRAST TECHNIQUE: Multidetector CT imaging of the chest, abdomen and pelvis was performed following the standard protocol during bolus administration of intravenous contrast. CONTRAST:  169mL ISOVUE-300 IOPAMIDOL (ISOVUE-300) INJECTION 61% COMPARISON:  CT scan of the chest December 27, 2011. FINDINGS: CT CHEST FINDINGS Cardiovascular: The thoracic aorta and central pulmonary arteries are normal in caliber. The heart is normal in size. No effusions. Mediastinum/Nodes: The patient is status post left mastectomy with a postoperative seroma measuring 7.1 x 3.2 cm. There is associated skin thickening which may be postsurgical in nature. Axillary clips are identified. A few normal lymph nodes are seen. Two rounded regions of increased attenuation adjacent to axillary clips are low in attenuation and most likely represent postsurgical changes rather than mildly prominent lymph nodes. The right axillary and bilateral retropectoral nodes are normal in appearance. The internal mammary lymph node chains are also normal in appearance. No adenopathy seen in the chest. The esophagus and thyroid are normal in appearance. Lungs/Pleura: Lungs are clear. No pleural effusion or pneumothorax. Musculoskeletal: No chest wall mass or suspicious bone lesions identified. CT ABDOMEN PELVIS FINDINGS Hepatobiliary: No focal liver abnormality is seen. Status post cholecystectomy. No biliary dilatation. Pancreas: Unremarkable. No pancreatic ductal dilatation or surrounding inflammatory changes. Spleen: Normal in size without focal abnormality. Adrenals/Urinary Tract: The right adrenal gland is normal. The left adrenal gland is mildly thickened without a discrete mass.  There may be a tiny nodule superiorly on series 2, image 58 measuring 6 mm. There is a nonobstructive stone in the left kidney. There is probably a tiny cyst in the left kidney, too small to characterize. No hydronephrosis, ureterectasis, or ureteral stones. The left side of the bladder is mildly thick walled compared to the remainder. No focal mass. Stomach/Bowel: Surgical clips adjacent to the stomach are identified. The gastric wall is mildly prominent, probably due to poor distention. The small bowel is normal. The colon is unremarkable. The appendix is not seen consistent previous appendectomy. Vascular/Lymphatic: Atherosclerotic changes are seen in the non aneurysmal aorta. No adenopathy is identified within the abdomen. Reproductive: There is a left adnexal cyst measuring  2.2 cm. The uterus and right ovary are normal. Other: The patient appears to be status post mesh hernia repair which is calcified. There is fluid just posterior to the mesh. No free air or other evidence of ascites. Musculoskeletal: No acute or significant osseous findings. IMPRESSION: 1. Left mastectomy changes with a seroma. Rounded regions of low attenuation in the axilla adjacent to surgical clips are favored to be postsurgical changes rather than mildly prominent lymph nodes. 2. The left adrenal gland is mildly thickened, likely due to hyperplasia. A tiny 6 mm nodule is likely a small adenoma. 3. Atherosclerotic changes in a non aneurysmal aorta. 4. Anterior hernia mesh is calcified. Fluid just deep to the hernia mesh is likely not acute and of doubtful acute significance. Electronically Signed   By: Dorise Bullion III M.D   On: 05/14/2016 10:13   Dg Chest Port 1 View  Result Date: 05/26/2016 CLINICAL DATA:  Port-A-Cath placement.  Breast cancer. EXAM: PORTABLE CHEST 1 VIEW COMPARISON:  04/12/2016 chest radiograph. FINDINGS: Surgical clips overlie the left axilla. Right subclavian MediPort terminates in the upper third of the  superior vena cava. Stable cardiomediastinal silhouette with normal heart size and aortic atherosclerosis. No pneumothorax. No pleural effusion. No overt pulmonary edema. No acute consolidative airspace disease. IMPRESSION: 1. No pneumothorax. 2. Right subclavian Port-A-Cath with tip in the upper third of the SCC. 3. No active cardiopulmonary disease. 4. Aortic atherosclerosis. Electronically Signed   By: Ilona Sorrel M.D.   On: 05/26/2016 11:26   Dg C-arm 1-60 Min-no Report  Result Date: 05/26/2016 Fluoroscopy was utilized by the requesting physician.  No radiographic interpretation.    Chesnee Floren,   [facility] Pager [phone]  If 7PM-7AM, please contact night-coverage [email or web address] Password [email or web address] 06/10/2016, 2:13 PM   LOS: 2 days

## 2016-06-10 NOTE — Progress Notes (Signed)
Inpatient Diabetes Program Recommendations  AACE/ADA: New Consensus Statement on Inpatient Glycemic Control (2015)  Target Ranges:  Prepandial:   less than 140 mg/dL      Peak postprandial:   less than 180 mg/dL (1-2 hours)      Critically ill patients:  140 - 180 mg/dL   Results for ADER, FRITZE (MRN [id number]) as of 06/10/2016 09:20  Ref. Range 06/09/2016 07:48 06/09/2016 11:30 06/09/2016 16:35 06/09/2016 21:04 06/10/2016 07:28  Glucose-Capillary Latest Ref Range: 65 - 99 mg/dL 262 (H)  Novolog 8 units  70/30 18 units 295 (H)  Novolog 8 units 251 (H)  Novolog 8 units  70/30 18 units 170 (H)  Lantus 30 units 189 (H)  Novolog 3 units  70/30 18 units   Review of Glycemic Control  Current orders for Inpatient glycemic control: Lantus 30 units QHS, 70/30 18 units BID, Novolog 0-15 units TID with meals, Novolog 0-5 units QHS  Inpatient Diabetes Program Recommendations: Insulin - Basal: Please consider increasing Lantus to 33 units QHS. Please consider increasing 70/30 to 22 units BID. Insulin-Correction: Over the past 24 hours, patient has received a total of Novolog 27 units for correction.  Thanks, Barnie Alderman, RN, MSN, CDE Diabetes Coordinator Inpatient Diabetes Program [phone] (Team Pager from 8am to 5pm)

## 2016-06-11 LAB — GLUCOSE, CAPILLARY
GLUCOSE-CAPILLARY: 286 mg/dL — AB (ref 65–99)
GLUCOSE-CAPILLARY: 282 mg/dL — AB (ref 65–99)
GLUCOSE-CAPILLARY: 184 mg/dL — AB (ref 65–99)
Glucose-Capillary: 245 mg/dL — ABNORMAL HIGH (ref 65–99)

## 2016-06-11 LAB — BASIC METABOLIC PANEL
Anion gap: 7 (ref 5–15)
BUN: 10 mg/dL (ref 6–20)
CALCIUM: 8.2 mg/dL — AB (ref 8.9–10.3)
CHLORIDE: 107 mmol/L (ref 101–111)
CO2: 25 mmol/L (ref 22–32)
CREATININE: 0.82 mg/dL (ref 0.44–1.00)
GFR calc Af Amer: >60 mL/min (ref >60)
GFR calc non Af Amer: >60 mL/min (ref >60)
GLUCOSE: 209 mg/dL — AB (ref 65–99)
Potassium: 3.9 mmol/L (ref 3.5–5.1)
Sodium: 139 mmol/L (ref 135–145)

## 2016-06-11 LAB — CBC
HCT: 35.7 % — ABNORMAL LOW (ref 36.0–46.0)
HEMOGLOBIN: 11.4 g/dL — AB (ref 12.0–15.0)
MCH: 27.7 pg (ref 26.0–34.0)
MCHC: 31.9 g/dL (ref 30.0–36.0)
MCV: 86.9 fL (ref 78.0–100.0)
Platelets: 183 10*3/uL (ref 150–400)
RBC: 4.11 MIL/uL (ref 3.87–5.11)
RDW: 15.9 % — ABNORMAL HIGH (ref 11.5–15.5)
WBC: 27.8 10*3/uL — ABNORMAL HIGH (ref 4.0–10.5)

## 2016-06-11 MED ORDER — INSULIN ASPART PROT & ASPART (70-30 MIX) 100 UNIT/ML ~~LOC~~ SUSP
23.0000 [IU] | Freq: Two times a day (BID) | SUBCUTANEOUS | Status: DC
  Administered 2016-06-12 – 2016-06-14 (×5): 23 [IU] via SUBCUTANEOUS

## 2016-06-11 MED ORDER — INSULIN GLARGINE 100 UNIT/ML ~~LOC~~ SOLN
33.0000 [IU] | Freq: Every day | SUBCUTANEOUS | Status: DC
  Administered 2016-06-11 – 2016-06-13 (×3): 33 [IU] via SUBCUTANEOUS
  Filled 2016-06-11 (×4): qty 0.33

## 2016-06-11 NOTE — Progress Notes (Signed)
Inpatient Diabetes Program Recommendations  AACE/ADA: New Consensus Statement on Inpatient Glycemic Control (2015)  Target Ranges:  Prepandial:   less than 140 mg/dL      Peak postprandial:   less than 180 mg/dL (1-2 hours)      Critically ill patients:  140 - 180 mg/dL  Results for Margaret, Mcdaniel (MRN [id number]) as of 06/11/2016 13:18  Ref. Range 06/10/2016 11:32 06/10/2016 16:10 06/10/2016 20:58 06/11/2016 08:02 06/11/2016 11:18  Glucose-Capillary Latest Ref Range: 65 - 99 mg/dL 284 (H)  Novolog 8 units 315 (H)  Novolog 11 units   70/30 18 units 210 (H)  Novolog 2 units  Lantus 30 units 286 (H)  Novolog 8 units  70/30 18 units 282 (H)  Novolog 8 units   Results for Margaret, Mcdaniel (MRN [id number]) as of 06/10/2016 09:20  Ref. Range 06/09/2016 07:48 06/09/2016 11:30 06/09/2016 16:35 06/09/2016 21:04 06/10/2016 07:28  Glucose-Capillary Latest Ref Range: 65 - 99 mg/dL 262 (H)  Novolog 8 units  70/30 18 units 295 (H)  Novolog 8 units 251 (H)  Novolog 8 units  70/30 18 units 170 (H)  Lantus 30 units 189 (H)  Novolog 3 units  70/30 18 units   Review of Glycemic Control  Current orders for Inpatient glycemic control: Lantus 30 units QHS, 70/30 18 units BID, Novolog 0-15 units TID with meals, Novolog 0-5 units QHS  Inpatient Diabetes Program Recommendations: Insulin - Basal:Please consider increasing Lantus to 33 units QHS. Please consider increasing 70/30 to 23 units BID. Insulin-Correction: Over the past 24 hours, patient has received a total of Novolog 37 units for correction.  Thanks, Barnie Alderman, RN, MSN, CDE Diabetes Coordinator Inpatient Diabetes Program [phone] (Team Pager from 8am to 5pm)

## 2016-06-11 NOTE — Care Management Important Message (Signed)
Important Message  Patient Details  Name: Margaret Mcdaniel MRN: [id number] Date of Birth: 19-Mar-1950   Medicare Important Message Given:  Yes    Sherald Barge, RN 06/11/2016, 10:59 AM

## 2016-06-11 NOTE — Progress Notes (Signed)
PROGRESS NOTE  Margaret Mcdaniel:[id number] DOB: [birth year] DOA: 06/07/2016 PCP: Wende Neighbors, MD  Brief History:  66 year old female with a history of left sided breast cancer, parkinsonism, diabetes mellitus, anxiety and hypothyroidism presents with bilateral lower back pain of 3 days duration.  The patient also has developed fevers and chills up to 102.56F at home. She has also noticed gradually increasing CBGs over the last 3 days at home. She has been taking ibuprofen at home for her fevers and pain. She normally takes ibuprofen up to 10 tablets a week. The patient had some nausea without emesis. Regarding her breast cancer, she was diagnosed on 04/14/2016 with her last chemotherapy on 05/31/2016. In the emergency room, patient was noted have WBC 16.5 with urinalysis showing TNTC WBC. She was started on ciprofloxacin for presumptive pyelonephritis.  Assessment/Plan: Sepsis -Secondary to pyelonephritis -Continue IV fluids -Patient presented with fever, tachycardia, and leukocytosis -Lactic acid 1.8 -Continue IV antibiotics -Blood cultures have shown no growth to date - she is no longer having high fevers and leukocytosis slowly improving  Pyelonephritis -The patient has a listed reaction of anaphylaxis to cephalexin -With the patient's rising WBC and minimal improvement, d/c cipro (suspect resistance due to prior exposure) -started aztreonam  -started vancomycin to empirically cover for Enterococcus pending cultures data -CT abd/pelvis done shows constipation, possible rectal prolapse, essentially stable appearance of fluid collection along a calcified anterior abdominal wall mesh, spigelian hernia containing small knuckle of colon without complicating features -Reviewed with general surgery, and no indication that CT findings would cause her current presentation of sepsis. We will continue with intravenous antibiotics for now and observe. If she fails to improve, can consider  further workup. -appears to be slowly clinically improving.  Diarrhea -improving -C. difficile negative. CT scan shows constipation. Continue bowel regimen.  CKD stage 2 to 3 -Baseline creatinine 1.0-1.3 -Creatinine stable at 0.9.  Diabetes Mellitus type 2 -elevated CBGs secondary to infectious process -Continue Lantus 33 units daily -Continue 70/30--23 units with meals -Changed to resistance sliding scale -Blood sugars are stable  Left breast cancer with metastasis to LN -Diagnosed 04/14/2016 -Last chemotherapy therapy 05/31/2016 -Follow up Dr. Talbert Cage outpt  Parkinson's Disease -patient requested outpt neurologist -referral made to movement disorder specialist in [place]  Anxiety/depression -Continue home dose Remeron, lorazepam  Hyperlipidemia -Continue statin  Hypothyroidism -Continue Synthroid -TSH 3.810    Disposition Plan:   Home in 2-3 days  Family Communication:  Discussed with daughter over the phone  Consultants:    Code Status:  FULL   DVT Prophylaxis:  [place] Heparin    Procedures: As Listed in Progress Note Above  Antibiotics: cipro 4/2>>4/3 vanco 4/3>>> Aztreonam 4/3>>>    Subjective: Overall she is feeling a little better today. She feels the diarrhea is improving.  Objective: Vitals:   06/10/16 1400 06/10/16 2100 06/11/16 0657 06/11/16 1300  BP: (!) 152/89 (!) 136/92 116/61 (!) 142/75  Pulse: 92 70 85 79  Resp: 19 20 16 18   Temp: 99.1 F (37.3 C) 98.9 F (37.2 C) 98 F (36.7 C) 99.3 F (37.4 C)  TempSrc: Oral Oral Oral Oral  SpO2: 98% 98% 94% 98%  Weight:      Height:        Intake/Output Summary (Last 24 hours) at 06/11/16 1903 Last data filed at 06/11/16 1700  Gross per 24 hour  Intake              480  ml  Output             1740 ml  Net            -1260 ml   Weight change:  Exam:   General:  Pt is alert, follows commands appropriately, not in acute distress  HEENT: No icterus, No thrush, No neck mass,  [place]/AT  Cardiovascular: RRR, S1/S2, no rubs, no gallops  Respiratory: CTA bilaterally, no wheezing, no crackles, no rhonchi  Abdomen: Soft/+BS, non tender, non distended, no guarding+ CVA tender  Extremities: No edema, No lymphangitis, No petechiae, No rashes, no synovitis   Data Reviewed: I have personally reviewed following labs and imaging studies Basic Metabolic Panel:  Recent Labs Lab 06/07/16 2010 06/08/16 0627 06/09/16 0603 06/10/16 0607 06/11/16 0507  NA 132* 137 136 140 139  K 3.7 3.3* 3.6 3.9 3.9  CL 98* 105 104 108 107  CO2 20* 24 23 25 25   GLUCOSE 267* 269* 303* 187* 209*  BUN 22* 18 10 9 10   CREATININE 1.33* 1.03* 0.90 0.83 0.82  CALCIUM 9.2 8.1* 8.2* 8.2* 8.2*   Liver Function Tests:  Recent Labs Lab 06/07/16 0934 06/07/16 2010  AST 36 29  ALT 27 29  ALKPHOS 84 87  BILITOT 0.6 0.6  PROT 6.3* 6.6  ALBUMIN 3.2* 3.5   No results for input(s): LIPASE, AMYLASE in the last 168 hours. No results for input(s): AMMONIA in the last 168 hours. Coagulation Profile:  Recent Labs Lab 06/07/16 2010  INR 1.05   CBC:  Recent Labs Lab 06/07/16 0934 06/07/16 2010 06/08/16 0627 06/09/16 0603 06/10/16 0607 06/11/16 0507  WBC 8.7 16.5* 20.2* 33.4* 31.4* 27.8*  NEUTROABS 2.2 6.1  --  22.7*  --   --   HGB 12.9 13.0 12.0 11.5* 11.6* 11.4*  HCT 39.4 38.6 35.6* 35.0* 35.5* 35.7*  MCV 85.1 83.9 84.4 85.2 86.4 86.9  PLT 185 211 205 207 185 183   Cardiac Enzymes: No results for input(s): CKTOTAL, CKMB, CKMBINDEX, TROPONINI in the last 168 hours. BNP: Invalid input(s): POCBNP CBG:  Recent Labs Lab 06/10/16 1610 06/10/16 2058 06/11/16 0802 06/11/16 1118 06/11/16 1652  GLUCAP 315* 210* 286* 282* 245*   HbA1C: No results for input(s): HGBA1C in the last 72 hours. Urine analysis:    Component Value Date/Time   COLORURINE YELLOW 06/07/2016 1933   APPEARANCEUR CLOUDY (A) 06/07/2016 1933   LABSPEC 1.016 06/07/2016 1933   PHURINE 5.0 06/07/2016 1933    GLUCOSEU >=500 (A) 06/07/2016 1933   HGBUR MODERATE (A) 06/07/2016 1933   BILIRUBINUR NEGATIVE 06/07/2016 1933   KETONESUR 5 (A) 06/07/2016 1933   PROTEINUR NEGATIVE 06/07/2016 1933   UROBILINOGEN 0.2 08/02/2012 1250   NITRITE POSITIVE (A) 06/07/2016 1933   LEUKOCYTESUR LARGE (A) 06/07/2016 1933   Sepsis Labs: @LABRCNTIP (procalcitonin:4,lacticidven:4) ) Recent Results (from the past 240 hour(s))  Urine culture     Status: Abnormal   Collection Time: 06/07/16  7:33 PM  Result Value Ref Range Status   Specimen Description URINE, CLEAN CATCH  Final   Special Requests NONE  Final   Culture MULTIPLE SPECIES PRESENT, SUGGEST RECOLLECTION (A)  Final   Report Status 06/09/2016 FINAL  Final  Culture, blood (Routine x 2)     Status: None (Preliminary result)   Collection Time: 06/07/16  8:13 PM  Result Value Ref Range Status   Specimen Description BLOOD RIGHT FOREARM  Final   Special Requests   Final    BOTTLES DRAWN AEROBIC AND ANAEROBIC  Blood Culture results may not be optimal due to an inadequate volume of blood received in culture bottles   Culture NO GROWTH 4 DAYS  Final   Report Status PENDING  Incomplete  Culture, blood (Routine x 2)     Status: None (Preliminary result)   Collection Time: 06/07/16  8:13 PM  Result Value Ref Range Status   Specimen Description RIGHT ANTECUBITAL  Final   Special Requests   Final    BOTTLES DRAWN AEROBIC AND ANAEROBIC Blood Culture adequate volume   Culture NO GROWTH 4 DAYS  Final   Report Status PENDING  Incomplete  C difficile quick scan w PCR reflex     Status: None   Collection Time: 06/08/16  6:10 PM  Result Value Ref Range Status   C Diff antigen NEGATIVE NEGATIVE Final   C Diff toxin NEGATIVE NEGATIVE Final   C Diff interpretation No C. difficile detected.  Final     Scheduled Meds: . allopurinol  100 mg Oral Daily  . aspirin EC  81 mg Oral Daily  . aztreonam  2 g Intravenous Q8H  . chlorproMAZINE  100 mg Oral QHS  . heparin   5,000 Units Subcutaneous Q8H  . insulin aspart  0-15 Units Subcutaneous TID WC  . insulin aspart  0-5 Units Subcutaneous QHS  . [START ON 06/12/2016] insulin aspart protamine- aspart  23 Units Subcutaneous BID WC  . insulin glargine  33 Units Subcutaneous QHS  . levothyroxine  50 mcg Oral QAC breakfast  . mirtazapine  15 mg Oral QHS  . pantoprazole  40 mg Oral Daily  . polyethylene glycol  17 g Oral Daily  . simvastatin  40 mg Oral QHS  . sodium chloride flush  3 mL Intravenous Q12H  . temazepam  15 mg Oral QHS  . vancomycin  1,000 mg Intravenous Q12H   Continuous Infusions:   Procedures/Studies: Dg Chest 2 View  Result Date: 06/07/2016 CLINICAL DATA:  Fever.  Cancer patient. EXAM: CHEST  2 VIEW COMPARISON:  05/26/2016 and 05/14/2016 FINDINGS: Power injectable Port-A-Cath tip:  Upper SVC. Thoracic spondylosis. Left axillary clips in the setting of prior left mastectomy. Atherosclerotic calcification of the aortic arch. No airspace opacity or pleural effusion. Slightly prominent pericardial adipose tissue as on prior exams, particularly along the cardiac apex. IMPRESSION: 1. No pneumonia or thoracic cause for the patient's fever is identified. 2.  Atherosclerotic calcification of the aortic arch. 3. Thoracic spondylosis. 4. Left mastectomy. Electronically Signed   By: Van Clines M.D.   On: 06/07/2016 20:01   Ct Chest W Contrast  Result Date: 05/14/2016 CLINICAL DATA:  Breast cancer staging status post mastectomy. Positive axillary lymph nodes. EXAM: CT CHEST, ABDOMEN, AND PELVIS WITH CONTRAST TECHNIQUE: Multidetector CT imaging of the chest, abdomen and pelvis was performed following the standard protocol during bolus administration of intravenous contrast. CONTRAST:  122mL ISOVUE-300 IOPAMIDOL (ISOVUE-300) INJECTION 61% COMPARISON:  CT scan of the chest December 27, 2011. FINDINGS: CT CHEST FINDINGS Cardiovascular: The thoracic aorta and central pulmonary arteries are normal in caliber.  The heart is normal in size. No effusions. Mediastinum/Nodes: The patient is status post left mastectomy with a postoperative seroma measuring 7.1 x 3.2 cm. There is associated skin thickening which may be postsurgical in nature. Axillary clips are identified. A few normal lymph nodes are seen. Two rounded regions of increased attenuation adjacent to axillary clips are low in attenuation and most likely represent postsurgical changes rather than mildly prominent lymph nodes. The right  axillary and bilateral retropectoral nodes are normal in appearance. The internal mammary lymph node chains are also normal in appearance. No adenopathy seen in the chest. The esophagus and thyroid are normal in appearance. Lungs/Pleura: Lungs are clear. No pleural effusion or pneumothorax. Musculoskeletal: No chest wall mass or suspicious bone lesions identified. CT ABDOMEN PELVIS FINDINGS Hepatobiliary: No focal liver abnormality is seen. Status post cholecystectomy. No biliary dilatation. Pancreas: Unremarkable. No pancreatic ductal dilatation or surrounding inflammatory changes. Spleen: Normal in size without focal abnormality. Adrenals/Urinary Tract: The right adrenal gland is normal. The left adrenal gland is mildly thickened without a discrete mass. There may be a tiny nodule superiorly on series 2, image 58 measuring 6 mm. There is a nonobstructive stone in the left kidney. There is probably a tiny cyst in the left kidney, too small to characterize. No hydronephrosis, ureterectasis, or ureteral stones. The left side of the bladder is mildly thick walled compared to the remainder. No focal mass. Stomach/Bowel: Surgical clips adjacent to the stomach are identified. The gastric wall is mildly prominent, probably due to poor distention. The small bowel is normal. The colon is unremarkable. The appendix is not seen consistent previous appendectomy. Vascular/Lymphatic: Atherosclerotic changes are seen in the non aneurysmal aorta. No  adenopathy is identified within the abdomen. Reproductive: There is a left adnexal cyst measuring 2.2 cm. The uterus and right ovary are normal. Other: The patient appears to be status post mesh hernia repair which is calcified. There is fluid just posterior to the mesh. No free air or other evidence of ascites. Musculoskeletal: No acute or significant osseous findings. IMPRESSION: 1. Left mastectomy changes with a seroma. Rounded regions of low attenuation in the axilla adjacent to surgical clips are favored to be postsurgical changes rather than mildly prominent lymph nodes. 2. The left adrenal gland is mildly thickened, likely due to hyperplasia. A tiny 6 mm nodule is likely a small adenoma. 3. Atherosclerotic changes in a non aneurysmal aorta. 4. Anterior hernia mesh is calcified. Fluid just deep to the hernia mesh is likely not acute and of doubtful acute significance. Electronically Signed   By: Dorise Bullion III M.D   On: 05/14/2016 10:13   Ct Abdomen Pelvis W Contrast  Result Date: 06/09/2016 CLINICAL DATA:  Low back pain for 4 days.  Diarrhea. EXAM: CT ABDOMEN AND PELVIS WITH CONTRAST TECHNIQUE: Multidetector CT imaging of the abdomen and pelvis was performed using the standard protocol following bolus administration of intravenous contrast. CONTRAST:  124mL ISOVUE-300 IOPAMIDOL (ISOVUE-300) INJECTION 61% COMPARISON:  05/14/2016 FINDINGS: Lower chest: Descending thoracic aortic atherosclerotic calcification. Hepatobiliary: Somewhat low-density liver, possible steatosis. Cholecystectomy. No specific focal liver lesion is identified. Pancreas: Unremarkable Spleen: Unremarkable Adrenals/Urinary Tract: Mild scarring in the left kidney lower pole anteriorly. Several nonobstructive left renal calculi measuring up to 3 mm in diameter in the mid kidney. Slightly dilated right mid ureteral segment without obvious cause. Adrenal glands unremarkable. Stomach/Bowel: Postoperative findings along the anterior margin  of the stomach. Small right Spigelian hernia with a knuckle of ascending colon extending into the hernia on image 35/2, without findings complicating feature. There is formed stool in the sigmoid colon rectum although there is also a small amount of fluid in the rectal vault distally near the anorectal junction. Vascular/Lymphatic: Peripancreatic node 1 cm in short axis on image 24/2. Small periaortic lymph nodes are not enlarged. Aortoiliac atherosclerotic vascular disease. Reproductive: Unremarkable Other: No supplemental non-categorized findings. Musculoskeletal: Ventral density suggesting possible hernia mesh along the anterior abdominal  wall with fluid collection along its margin as on image 37/2, not appreciably changed from the earlier exam. Absent coccyx with a flat mesh in the expected location of the coccyx as on image 68/5, with pelvic floor laxity and potentially mild rectal prolapse. L5 is partially sacralized. Facet arthropathy and intervertebral spurring in the lumbar spine along with suspected degenerative disc disease at L3-4 and L4-5 potentially causing mild levels of impingement. IMPRESSION: 1. Possible rectal prolapse with pelvic floor laxity. This is at least partially related to the absence of the coccyx and the posterior mesh implant in the vicinity of the coccyx. In the rectal vault there is both formed stool and some fluid, along with a potentially patulous anorectal junction. 2. Essentially stable appearance of fluid collection along a calcified anterior abdominal wall mesh. 3. Spigelian hernia on the right contains a small knuckle of colon, without current evidence of complicating feature. 4. Suspected hepatic steatosis. 5.  Aortoiliac atherosclerotic vascular disease. 6. Potential mild levels of impingement in the lumbar spine due to spondylosis and degenerative disc disease. 7. Nonobstructive left nephrolithiasis. Mild scarring in the left kidney lower pole anteriorly. Electronically  Signed   By: Van Clines M.D.   On: 06/09/2016 07:33   Ct Abdomen Pelvis W Contrast  Result Date: 05/14/2016 CLINICAL DATA:  Breast cancer staging status post mastectomy. Positive axillary lymph nodes. EXAM: CT CHEST, ABDOMEN, AND PELVIS WITH CONTRAST TECHNIQUE: Multidetector CT imaging of the chest, abdomen and pelvis was performed following the standard protocol during bolus administration of intravenous contrast. CONTRAST:  147mL ISOVUE-300 IOPAMIDOL (ISOVUE-300) INJECTION 61% COMPARISON:  CT scan of the chest December 27, 2011. FINDINGS: CT CHEST FINDINGS Cardiovascular: The thoracic aorta and central pulmonary arteries are normal in caliber. The heart is normal in size. No effusions. Mediastinum/Nodes: The patient is status post left mastectomy with a postoperative seroma measuring 7.1 x 3.2 cm. There is associated skin thickening which may be postsurgical in nature. Axillary clips are identified. A few normal lymph nodes are seen. Two rounded regions of increased attenuation adjacent to axillary clips are low in attenuation and most likely represent postsurgical changes rather than mildly prominent lymph nodes. The right axillary and bilateral retropectoral nodes are normal in appearance. The internal mammary lymph node chains are also normal in appearance. No adenopathy seen in the chest. The esophagus and thyroid are normal in appearance. Lungs/Pleura: Lungs are clear. No pleural effusion or pneumothorax. Musculoskeletal: No chest wall mass or suspicious bone lesions identified. CT ABDOMEN PELVIS FINDINGS Hepatobiliary: No focal liver abnormality is seen. Status post cholecystectomy. No biliary dilatation. Pancreas: Unremarkable. No pancreatic ductal dilatation or surrounding inflammatory changes. Spleen: Normal in size without focal abnormality. Adrenals/Urinary Tract: The right adrenal gland is normal. The left adrenal gland is mildly thickened without a discrete mass. There may be a tiny nodule  superiorly on series 2, image 58 measuring 6 mm. There is a nonobstructive stone in the left kidney. There is probably a tiny cyst in the left kidney, too small to characterize. No hydronephrosis, ureterectasis, or ureteral stones. The left side of the bladder is mildly thick walled compared to the remainder. No focal mass. Stomach/Bowel: Surgical clips adjacent to the stomach are identified. The gastric wall is mildly prominent, probably due to poor distention. The small bowel is normal. The colon is unremarkable. The appendix is not seen consistent previous appendectomy. Vascular/Lymphatic: Atherosclerotic changes are seen in the non aneurysmal aorta. No adenopathy is identified within the abdomen. Reproductive: There is a  left adnexal cyst measuring 2.2 cm. The uterus and right ovary are normal. Other: The patient appears to be status post mesh hernia repair which is calcified. There is fluid just posterior to the mesh. No free air or other evidence of ascites. Musculoskeletal: No acute or significant osseous findings. IMPRESSION: 1. Left mastectomy changes with a seroma. Rounded regions of low attenuation in the axilla adjacent to surgical clips are favored to be postsurgical changes rather than mildly prominent lymph nodes. 2. The left adrenal gland is mildly thickened, likely due to hyperplasia. A tiny 6 mm nodule is likely a small adenoma. 3. Atherosclerotic changes in a non aneurysmal aorta. 4. Anterior hernia mesh is calcified. Fluid just deep to the hernia mesh is likely not acute and of doubtful acute significance. Electronically Signed   By: Dorise Bullion III M.D   On: 05/14/2016 10:13   Dg Chest Port 1 View  Result Date: 05/26/2016 CLINICAL DATA:  Port-A-Cath placement.  Breast cancer. EXAM: PORTABLE CHEST 1 VIEW COMPARISON:  04/12/2016 chest radiograph. FINDINGS: Surgical clips overlie the left axilla. Right subclavian MediPort terminates in the upper third of the superior vena cava. Stable  cardiomediastinal silhouette with normal heart size and aortic atherosclerosis. No pneumothorax. No pleural effusion. No overt pulmonary edema. No acute consolidative airspace disease. IMPRESSION: 1. No pneumothorax. 2. Right subclavian Port-A-Cath with tip in the upper third of the SCC. 3. No active cardiopulmonary disease. 4. Aortic atherosclerosis. Electronically Signed   By: Ilona Sorrel M.D.   On: 05/26/2016 11:26   Dg C-arm 1-60 Min-no Report  Result Date: 05/26/2016 Fluoroscopy was utilized by the requesting physician.  No radiographic interpretation.    Shantel Wesely,   [facility] Pager [phone]  If 7PM-7AM, please contact night-coverage [email or web address] Password [email or web address] 06/11/2016, 7:03 PM   LOS: 3 days

## 2016-06-12 LAB — CULTURE, BLOOD (ROUTINE X 2)
CULTURE: NO GROWTH
Culture: NO GROWTH
SPECIAL REQUESTS: ADEQUATE

## 2016-06-12 LAB — GLUCOSE, CAPILLARY
GLUCOSE-CAPILLARY: 215 mg/dL — AB (ref 65–99)
Glucose-Capillary: 155 mg/dL — ABNORMAL HIGH (ref 65–99)
Glucose-Capillary: 224 mg/dL — ABNORMAL HIGH (ref 65–99)
Glucose-Capillary: 161 mg/dL — ABNORMAL HIGH (ref 65–99)

## 2016-06-12 LAB — CBC
HEMATOCRIT: 36 % (ref 36.0–46.0)
Hemoglobin: 11.8 g/dL — ABNORMAL LOW (ref 12.0–15.0)
MCH: 28.2 pg (ref 26.0–34.0)
MCHC: 32.8 g/dL (ref 30.0–36.0)
MCV: 85.9 fL (ref 78.0–100.0)
PLATELETS: 160 10*3/uL (ref 150–400)
RBC: 4.19 MIL/uL (ref 3.87–5.11)
RDW: 15.8 % — ABNORMAL HIGH (ref 11.5–15.5)
WBC: 22.1 10*3/uL — AB (ref 4.0–10.5)

## 2016-06-12 LAB — BASIC METABOLIC PANEL
ANION GAP: 7 (ref 5–15)
BUN: 9 mg/dL (ref 6–20)
CHLORIDE: 107 mmol/L (ref 101–111)
CO2: 27 mmol/L (ref 22–32)
Calcium: 8.6 mg/dL — ABNORMAL LOW (ref 8.9–10.3)
Creatinine, Ser: 0.83 mg/dL (ref 0.44–1.00)
GFR calc Af Amer: >60 mL/min (ref >60)
GLUCOSE: 162 mg/dL — AB (ref 65–99)
POTASSIUM: 3.7 mmol/L (ref 3.5–5.1)
Sodium: 141 mmol/L (ref 135–145)

## 2016-06-12 LAB — VANCOMYCIN, TROUGH: Vancomycin Tr: 14 ug/mL — ABNORMAL LOW (ref 15–20)

## 2016-06-12 MED ORDER — VANCOMYCIN HCL 10 G IV SOLR
1250.0000 mg | Freq: Two times a day (BID) | INTRAVENOUS | Status: DC
  Administered 2016-06-12 – 2016-06-14 (×4): 1250 mg via INTRAVENOUS
  Filled 2016-06-12 (×6): qty 1250

## 2016-06-12 NOTE — Progress Notes (Signed)
Pharmacy Antibiotic Note  Margaret Mcdaniel is a 66 y.o. female admitted on 06/07/2016 with sepsis.  Pharmacy has been consulted for VANCOMYCIN dosing. Patient slowly improving. Afebrile, and WBC trending down.  Vancomycin trough just below goal of 15-19mcg/ml. Estimated pharmacokinetic parameters: rate constant (kel): 0.069 hr-1; half-life: 10.05 Hours; and Vd from levels: 62.30  Liters  (0.7 L/kg)  Plan: Increase Vancomycin 1250mg  q12hrs. Goal trough 15-18mcg/ml Continue Aztreonam 2gm IV q8h Monitor labs, progress, c/s  Height: 5\' 4"  (162.6 cm) Weight: 195 lb 12.3 oz (88.8 kg) IBW/kg (Calculated) : 54.7  Temp (24hrs), Avg:98.8 F (37.1 C), Min:98.5 F (36.9 C), Max:99.3 F (37.4 C)   Recent Labs Lab 06/07/16 2013 06/07/16 2240 06/08/16 0627 06/09/16 0603 06/10/16 0607 06/11/16 0507 06/12/16 0638  WBC  --   --  20.2* 33.4* 31.4* 27.8* 22.1*  CREATININE  --   --  1.03* 0.90 0.83 0.82 0.83  LATICACIDVEN 1.8 1.7  --   --   --   --   --   VANCOTROUGH  --   --   --   --   --   --  14*    Estimated Creatinine Clearance: 70.9 mL/min (by C-G formula based on SCr of 0.83 mg/dL).    Allergies  Allergen Reactions  . Keflex [Cephalexin] Anaphylaxis    Pt "codes" on this medication.  . Sulfa Antibiotics Hives  . Latex Rash   Antimicrobials this admission: Vancomycin 4/3 >>  Aztreonam 4/3 >>  Cipro 4/2>> 4/3  Dose adjustments this admission:  Microbiology results:  4/2 BCx: ngtd  4/2 UCx: multiple species, NTR  Sputum:   04/14/16 MRSA PCR: negative  Thank you for allowing pharmacy to be a part of this patient's care.  Isac Sarna, BS Pharm D, [place] Clinical Pharmacist Pager [phone] 06/12/2016 8:33 AM

## 2016-06-12 NOTE — Progress Notes (Signed)
PROGRESS NOTE  ARNOLD DEPINTO BSJ:[id number] DOB: 03-15-1950 DOA: 06/07/2016 PCP: Wende Neighbors, MD  Brief History:  66 year old female with a history of left sided breast cancer, parkinsonism, diabetes mellitus, anxiety and hypothyroidism presents with bilateral lower back pain of 3 days duration.  The patient also has developed fevers and chills up to 102.32F at home. She has also noticed gradually increasing CBGs over the last 3 days at home. She has been taking ibuprofen at home for her fevers and pain. She normally takes ibuprofen up to 10 tablets a week. The patient had some nausea without emesis. Regarding her breast cancer, she was diagnosed on 04/14/2016 with her last chemotherapy on 05/31/2016. In the emergency room, patient was noted have WBC 16.5 with urinalysis showing TNTC WBC. She was started on ciprofloxacin for presumptive pyelonephritis.  Assessment/Plan: Sepsis -Secondary to pyelonephritis -Continue IV fluids -Patient presented with fever, tachycardia, and leukocytosis -Lactic acid 1.8 -Continue IV antibiotics -Blood cultures have shown no growth to date - she is no longer having high fevers and leukocytosis slowly improving  Pyelonephritis -The patient has a listed reaction of anaphylaxis to cephalexin -With the patient's rising WBC and minimal improvement, d/c cipro (suspect resistance due to prior exposure) -started aztreonam  -started vancomycin to empirically cover for Enterococcus pending cultures data -CT abd/pelvis done shows constipation, possible rectal prolapse, essentially stable appearance of fluid collection along a calcified anterior abdominal wall mesh, spigelian hernia containing small knuckle of colon without complicating features -Reviewed with general surgery, and no indication that CT findings would cause her current presentation of sepsis. We will continue with intravenous antibiotics for now and observe. If she fails to improve, can consider  further workup. -appears to be slowly clinically improving.  Diarrhea -improving -C. difficile negative. CT scan shows constipation. Continue bowel regimen.  CKD stage 2 to 3 -Baseline creatinine 1.0-1.3 -Creatinine stable at 0.9.  Diabetes Mellitus type 2 -elevated CBGs secondary to infectious process -Continue Lantus 33 units daily -Continue 70/30--23 units with meals -Changed to resistance sliding scale -Blood sugars are stable  Left breast cancer with metastasis to LN -Diagnosed 04/14/2016 -Last chemotherapy therapy 05/31/2016 -Follow up Dr. Talbert Cage outpt  Parkinson's Disease -patient requested outpt neurologist -referral made to movement disorder specialist in [place]  Anxiety/depression -Continue home dose Remeron, lorazepam  Hyperlipidemia -Continue statin  Hypothyroidism -Continue Synthroid -TSH 3.810    Disposition Plan:   Home in 2-3 days  Family Communication:  Discussed with daughter over the phone  Consultants:    Code Status:  FULL   DVT Prophylaxis:  [place] Heparin    Procedures: As Listed in Progress Note Above  Antibiotics: cipro 4/2>>4/3 vanco 4/3>>> Aztreonam 4/3>>>    Subjective: Diarrhea improving. Back pain is slowly getting better.   Objective: Vitals:   06/11/16 1300 06/11/16 2136 06/12/16 0519 06/12/16 1300  BP: (!) 142/75 (!) 143/75 (!) 147/60 (!) 143/77  Pulse: 79 65 90 83  Resp: 18 16 18 20   Temp: 99.3 F (37.4 C) 98.6 F (37 C) 98.5 F (36.9 C) 98.4 F (36.9 C)  TempSrc: Oral Oral Oral Oral  SpO2: 98% 98% 97% 95%  Weight:      Height:        Intake/Output Summary (Last 24 hours) at 06/12/16 1810 Last data filed at 06/12/16 1733  Gross per 24 hour  Intake              660 ml  Output  500 ml  Net              160 ml   Weight change:  Exam:   General:  Pt is alert, follows commands appropriately, not in acute distress  HEENT: No icterus, No thrush, No neck mass, [place]/AT  Cardiovascular: RRR, S1/S2,  no rubs, no gallops  Respiratory: CTA bilaterally, no wheezing, no crackles, no rhonchi  Abdomen: Soft/+BS, non tender, non distended, no guarding+ CVA tender  Extremities: No edema, No lymphangitis, No petechiae, No rashes, no synovitis   Data Reviewed: I have personally reviewed following labs and imaging studies Basic Metabolic Panel:  Recent Labs Lab 06/08/16 0627 06/09/16 0603 06/10/16 0607 06/11/16 0507 06/12/16 0638  NA 137 136 140 139 141  K 3.3* 3.6 3.9 3.9 3.7  CL 105 104 108 107 107  CO2 24 23 25 25 27   GLUCOSE 269* 303* 187* 209* 162*  BUN 18 10 9 10 9   CREATININE 1.03* 0.90 0.83 0.82 0.83  CALCIUM 8.1* 8.2* 8.2* 8.2* 8.6*   Liver Function Tests:  Recent Labs Lab 06/07/16 0934 06/07/16 2010  AST 36 29  ALT 27 29  ALKPHOS 84 87  BILITOT 0.6 0.6  PROT 6.3* 6.6  ALBUMIN 3.2* 3.5   No results for input(s): LIPASE, AMYLASE in the last 168 hours. No results for input(s): AMMONIA in the last 168 hours. Coagulation Profile:  Recent Labs Lab 06/07/16 2010  INR 1.05   CBC:  Recent Labs Lab 06/07/16 0934 06/07/16 2010 06/08/16 0627 06/09/16 0603 06/10/16 0607 06/11/16 0507 06/12/16 0638  WBC 8.7 16.5* 20.2* 33.4* 31.4* 27.8* 22.1*  NEUTROABS 2.2 6.1  --  22.7*  --   --   --   HGB 12.9 13.0 12.0 11.5* 11.6* 11.4* 11.8*  HCT 39.4 38.6 35.6* 35.0* 35.5* 35.7* 36.0  MCV 85.1 83.9 84.4 85.2 86.4 86.9 85.9  PLT 185 211 205 207 185 183 160   Cardiac Enzymes: No results for input(s): CKTOTAL, CKMB, CKMBINDEX, TROPONINI in the last 168 hours. BNP: Invalid input(s): POCBNP CBG:  Recent Labs Lab 06/11/16 1652 06/11/16 2133 06/12/16 0728 06/12/16 1123 06/12/16 1611  GLUCAP 245* 184* 155* 215* 224*   HbA1C: No results for input(s): HGBA1C in the last 72 hours. Urine analysis:    Component Value Date/Time   COLORURINE YELLOW 06/07/2016 1933   APPEARANCEUR CLOUDY (A) 06/07/2016 1933   LABSPEC 1.016 06/07/2016 1933   PHURINE 5.0 06/07/2016  1933   GLUCOSEU >=500 (A) 06/07/2016 1933   HGBUR MODERATE (A) 06/07/2016 1933   BILIRUBINUR NEGATIVE 06/07/2016 1933   KETONESUR 5 (A) 06/07/2016 1933   PROTEINUR NEGATIVE 06/07/2016 1933   UROBILINOGEN 0.2 08/02/2012 1250   NITRITE POSITIVE (A) 06/07/2016 1933   LEUKOCYTESUR LARGE (A) 06/07/2016 1933   Sepsis Labs: @LABRCNTIP (procalcitonin:4,lacticidven:4) ) Recent Results (from the past 240 hour(s))  Urine culture     Status: Abnormal   Collection Time: 06/07/16  7:33 PM  Result Value Ref Range Status   Specimen Description URINE, CLEAN CATCH  Final   Special Requests NONE  Final   Culture MULTIPLE SPECIES PRESENT, SUGGEST RECOLLECTION (A)  Final   Report Status 06/09/2016 FINAL  Final  Culture, blood (Routine x 2)     Status: None   Collection Time: 06/07/16  8:13 PM  Result Value Ref Range Status   Specimen Description BLOOD RIGHT FOREARM  Final   Special Requests   Final    BOTTLES DRAWN AEROBIC AND ANAEROBIC Blood Culture results may not  be optimal due to an inadequate volume of blood received in culture bottles   Culture NO GROWTH 5 DAYS  Final   Report Status 06/12/2016 FINAL  Final  Culture, blood (Routine x 2)     Status: None   Collection Time: 06/07/16  8:13 PM  Result Value Ref Range Status   Specimen Description RIGHT ANTECUBITAL  Final   Special Requests   Final    BOTTLES DRAWN AEROBIC AND ANAEROBIC Blood Culture adequate volume   Culture NO GROWTH 5 DAYS  Final   Report Status 06/12/2016 FINAL  Final  C difficile quick scan w PCR reflex     Status: None   Collection Time: 06/08/16  6:10 PM  Result Value Ref Range Status   C Diff antigen NEGATIVE NEGATIVE Final   C Diff toxin NEGATIVE NEGATIVE Final   C Diff interpretation No C. difficile detected.  Final     Scheduled Meds: . allopurinol  100 mg Oral Daily  . aspirin EC  81 mg Oral Daily  . aztreonam  2 g Intravenous Q8H  . chlorproMAZINE  100 mg Oral QHS  . heparin  5,000 Units Subcutaneous Q8H  .  insulin aspart  0-15 Units Subcutaneous TID WC  . insulin aspart  0-5 Units Subcutaneous QHS  . insulin aspart protamine- aspart  23 Units Subcutaneous BID WC  . insulin glargine  33 Units Subcutaneous QHS  . levothyroxine  50 mcg Oral QAC breakfast  . mirtazapine  15 mg Oral QHS  . pantoprazole  40 mg Oral Daily  . polyethylene glycol  17 g Oral Daily  . simvastatin  40 mg Oral QHS  . sodium chloride flush  3 mL Intravenous Q12H  . temazepam  15 mg Oral QHS  . vancomycin  1,250 mg Intravenous Q12H   Continuous Infusions:   Procedures/Studies: Dg Chest 2 View  Result Date: 06/07/2016 CLINICAL DATA:  Fever.  Cancer patient. EXAM: CHEST  2 VIEW COMPARISON:  05/26/2016 and 05/14/2016 FINDINGS: Power injectable Port-A-Cath tip:  Upper SVC. Thoracic spondylosis. Left axillary clips in the setting of prior left mastectomy. Atherosclerotic calcification of the aortic arch. No airspace opacity or pleural effusion. Slightly prominent pericardial adipose tissue as on prior exams, particularly along the cardiac apex. IMPRESSION: 1. No pneumonia or thoracic cause for the patient's fever is identified. 2.  Atherosclerotic calcification of the aortic arch. 3. Thoracic spondylosis. 4. Left mastectomy. Electronically Signed   By: Van Clines M.D.   On: 06/07/2016 20:01   Ct Chest W Contrast  Result Date: 05/14/2016 CLINICAL DATA:  Breast cancer staging status post mastectomy. Positive axillary lymph nodes. EXAM: CT CHEST, ABDOMEN, AND PELVIS WITH CONTRAST TECHNIQUE: Multidetector CT imaging of the chest, abdomen and pelvis was performed following the standard protocol during bolus administration of intravenous contrast. CONTRAST:  196mL ISOVUE-300 IOPAMIDOL (ISOVUE-300) INJECTION 61% COMPARISON:  CT scan of the chest December 27, 2011. FINDINGS: CT CHEST FINDINGS Cardiovascular: The thoracic aorta and central pulmonary arteries are normal in caliber. The heart is normal in size. No effusions.  Mediastinum/Nodes: The patient is status post left mastectomy with a postoperative seroma measuring 7.1 x 3.2 cm. There is associated skin thickening which may be postsurgical in nature. Axillary clips are identified. A few normal lymph nodes are seen. Two rounded regions of increased attenuation adjacent to axillary clips are low in attenuation and most likely represent postsurgical changes rather than mildly prominent lymph nodes. The right axillary and bilateral retropectoral nodes are normal in  appearance. The internal mammary lymph node chains are also normal in appearance. No adenopathy seen in the chest. The esophagus and thyroid are normal in appearance. Lungs/Pleura: Lungs are clear. No pleural effusion or pneumothorax. Musculoskeletal: No chest wall mass or suspicious bone lesions identified. CT ABDOMEN PELVIS FINDINGS Hepatobiliary: No focal liver abnormality is seen. Status post cholecystectomy. No biliary dilatation. Pancreas: Unremarkable. No pancreatic ductal dilatation or surrounding inflammatory changes. Spleen: Normal in size without focal abnormality. Adrenals/Urinary Tract: The right adrenal gland is normal. The left adrenal gland is mildly thickened without a discrete mass. There may be a tiny nodule superiorly on series 2, image 58 measuring 6 mm. There is a nonobstructive stone in the left kidney. There is probably a tiny cyst in the left kidney, too small to characterize. No hydronephrosis, ureterectasis, or ureteral stones. The left side of the bladder is mildly thick walled compared to the remainder. No focal mass. Stomach/Bowel: Surgical clips adjacent to the stomach are identified. The gastric wall is mildly prominent, probably due to poor distention. The small bowel is normal. The colon is unremarkable. The appendix is not seen consistent previous appendectomy. Vascular/Lymphatic: Atherosclerotic changes are seen in the non aneurysmal aorta. No adenopathy is identified within the  abdomen. Reproductive: There is a left adnexal cyst measuring 2.2 cm. The uterus and right ovary are normal. Other: The patient appears to be status post mesh hernia repair which is calcified. There is fluid just posterior to the mesh. No free air or other evidence of ascites. Musculoskeletal: No acute or significant osseous findings. IMPRESSION: 1. Left mastectomy changes with a seroma. Rounded regions of low attenuation in the axilla adjacent to surgical clips are favored to be postsurgical changes rather than mildly prominent lymph nodes. 2. The left adrenal gland is mildly thickened, likely due to hyperplasia. A tiny 6 mm nodule is likely a small adenoma. 3. Atherosclerotic changes in a non aneurysmal aorta. 4. Anterior hernia mesh is calcified. Fluid just deep to the hernia mesh is likely not acute and of doubtful acute significance. Electronically Signed   By: Dorise Bullion III M.D   On: 05/14/2016 10:13   Ct Abdomen Pelvis W Contrast  Result Date: 06/09/2016 CLINICAL DATA:  Low back pain for 4 days.  Diarrhea. EXAM: CT ABDOMEN AND PELVIS WITH CONTRAST TECHNIQUE: Multidetector CT imaging of the abdomen and pelvis was performed using the standard protocol following bolus administration of intravenous contrast. CONTRAST:  142mL ISOVUE-300 IOPAMIDOL (ISOVUE-300) INJECTION 61% COMPARISON:  05/14/2016 FINDINGS: Lower chest: Descending thoracic aortic atherosclerotic calcification. Hepatobiliary: Somewhat low-density liver, possible steatosis. Cholecystectomy. No specific focal liver lesion is identified. Pancreas: Unremarkable Spleen: Unremarkable Adrenals/Urinary Tract: Mild scarring in the left kidney lower pole anteriorly. Several nonobstructive left renal calculi measuring up to 3 mm in diameter in the mid kidney. Slightly dilated right mid ureteral segment without obvious cause. Adrenal glands unremarkable. Stomach/Bowel: Postoperative findings along the anterior margin of the stomach. Small right  Spigelian hernia with a knuckle of ascending colon extending into the hernia on image 35/2, without findings complicating feature. There is formed stool in the sigmoid colon rectum although there is also a small amount of fluid in the rectal vault distally near the anorectal junction. Vascular/Lymphatic: Peripancreatic node 1 cm in short axis on image 24/2. Small periaortic lymph nodes are not enlarged. Aortoiliac atherosclerotic vascular disease. Reproductive: Unremarkable Other: No supplemental non-categorized findings. Musculoskeletal: Ventral density suggesting possible hernia mesh along the anterior abdominal wall with fluid collection along its margin as  on image 37/2, not appreciably changed from the earlier exam. Absent coccyx with a flat mesh in the expected location of the coccyx as on image 68/5, with pelvic floor laxity and potentially mild rectal prolapse. L5 is partially sacralized. Facet arthropathy and intervertebral spurring in the lumbar spine along with suspected degenerative disc disease at L3-4 and L4-5 potentially causing mild levels of impingement. IMPRESSION: 1. Possible rectal prolapse with pelvic floor laxity. This is at least partially related to the absence of the coccyx and the posterior mesh implant in the vicinity of the coccyx. In the rectal vault there is both formed stool and some fluid, along with a potentially patulous anorectal junction. 2. Essentially stable appearance of fluid collection along a calcified anterior abdominal wall mesh. 3. Spigelian hernia on the right contains a small knuckle of colon, without current evidence of complicating feature. 4. Suspected hepatic steatosis. 5.  Aortoiliac atherosclerotic vascular disease. 6. Potential mild levels of impingement in the lumbar spine due to spondylosis and degenerative disc disease. 7. Nonobstructive left nephrolithiasis. Mild scarring in the left kidney lower pole anteriorly. Electronically Signed   By: Van Clines  M.D.   On: 06/09/2016 07:33   Ct Abdomen Pelvis W Contrast  Result Date: 05/14/2016 CLINICAL DATA:  Breast cancer staging status post mastectomy. Positive axillary lymph nodes. EXAM: CT CHEST, ABDOMEN, AND PELVIS WITH CONTRAST TECHNIQUE: Multidetector CT imaging of the chest, abdomen and pelvis was performed following the standard protocol during bolus administration of intravenous contrast. CONTRAST:  128mL ISOVUE-300 IOPAMIDOL (ISOVUE-300) INJECTION 61% COMPARISON:  CT scan of the chest December 27, 2011. FINDINGS: CT CHEST FINDINGS Cardiovascular: The thoracic aorta and central pulmonary arteries are normal in caliber. The heart is normal in size. No effusions. Mediastinum/Nodes: The patient is status post left mastectomy with a postoperative seroma measuring 7.1 x 3.2 cm. There is associated skin thickening which may be postsurgical in nature. Axillary clips are identified. A few normal lymph nodes are seen. Two rounded regions of increased attenuation adjacent to axillary clips are low in attenuation and most likely represent postsurgical changes rather than mildly prominent lymph nodes. The right axillary and bilateral retropectoral nodes are normal in appearance. The internal mammary lymph node chains are also normal in appearance. No adenopathy seen in the chest. The esophagus and thyroid are normal in appearance. Lungs/Pleura: Lungs are clear. No pleural effusion or pneumothorax. Musculoskeletal: No chest wall mass or suspicious bone lesions identified. CT ABDOMEN PELVIS FINDINGS Hepatobiliary: No focal liver abnormality is seen. Status post cholecystectomy. No biliary dilatation. Pancreas: Unremarkable. No pancreatic ductal dilatation or surrounding inflammatory changes. Spleen: Normal in size without focal abnormality. Adrenals/Urinary Tract: The right adrenal gland is normal. The left adrenal gland is mildly thickened without a discrete mass. There may be a tiny nodule superiorly on series 2, image 58  measuring 6 mm. There is a nonobstructive stone in the left kidney. There is probably a tiny cyst in the left kidney, too small to characterize. No hydronephrosis, ureterectasis, or ureteral stones. The left side of the bladder is mildly thick walled compared to the remainder. No focal mass. Stomach/Bowel: Surgical clips adjacent to the stomach are identified. The gastric wall is mildly prominent, probably due to poor distention. The small bowel is normal. The colon is unremarkable. The appendix is not seen consistent previous appendectomy. Vascular/Lymphatic: Atherosclerotic changes are seen in the non aneurysmal aorta. No adenopathy is identified within the abdomen. Reproductive: There is a left adnexal cyst measuring 2.2 cm. The uterus  and right ovary are normal. Other: The patient appears to be status post mesh hernia repair which is calcified. There is fluid just posterior to the mesh. No free air or other evidence of ascites. Musculoskeletal: No acute or significant osseous findings. IMPRESSION: 1. Left mastectomy changes with a seroma. Rounded regions of low attenuation in the axilla adjacent to surgical clips are favored to be postsurgical changes rather than mildly prominent lymph nodes. 2. The left adrenal gland is mildly thickened, likely due to hyperplasia. A tiny 6 mm nodule is likely a small adenoma. 3. Atherosclerotic changes in a non aneurysmal aorta. 4. Anterior hernia mesh is calcified. Fluid just deep to the hernia mesh is likely not acute and of doubtful acute significance. Electronically Signed   By: Dorise Bullion III M.D   On: 05/14/2016 10:13   Dg Chest Port 1 View  Result Date: 05/26/2016 CLINICAL DATA:  Port-A-Cath placement.  Breast cancer. EXAM: PORTABLE CHEST 1 VIEW COMPARISON:  04/12/2016 chest radiograph. FINDINGS: Surgical clips overlie the left axilla. Right subclavian MediPort terminates in the upper third of the superior vena cava. Stable cardiomediastinal silhouette with  normal heart size and aortic atherosclerosis. No pneumothorax. No pleural effusion. No overt pulmonary edema. No acute consolidative airspace disease. IMPRESSION: 1. No pneumothorax. 2. Right subclavian Port-A-Cath with tip in the upper third of the SCC. 3. No active cardiopulmonary disease. 4. Aortic atherosclerosis. Electronically Signed   By: Ilona Sorrel M.D.   On: 05/26/2016 11:26   Dg C-arm 1-60 Min-no Report  Result Date: 05/26/2016 Fluoroscopy was utilized by the requesting physician.  No radiographic interpretation.    Taj Nevins,   [facility] Pager [phone]  If 7PM-7AM, please contact night-coverage [email or web address] Password [email or web address] 06/12/2016, 6:10 PM   LOS: 4 days

## 2016-06-13 DIAGNOSIS — L8990 Pressure ulcer of unspecified site, unspecified stage: Secondary | ICD-10-CM | POA: Insufficient documentation

## 2016-06-13 LAB — CBC
HCT: 36.3 % (ref 36.0–46.0)
Hemoglobin: 11.7 g/dL — ABNORMAL LOW (ref 12.0–15.0)
MCH: 28 pg (ref 26.0–34.0)
MCHC: 32.2 g/dL (ref 30.0–36.0)
MCV: 86.8 fL (ref 78.0–100.0)
Platelets: 151 10*3/uL (ref 150–400)
RBC: 4.18 MIL/uL (ref 3.87–5.11)
RDW: 16.2 % — ABNORMAL HIGH (ref 11.5–15.5)
WBC: 17.7 10*3/uL — ABNORMAL HIGH (ref 4.0–10.5)

## 2016-06-13 LAB — GLUCOSE, CAPILLARY
Glucose-Capillary: 148 mg/dL — ABNORMAL HIGH (ref 65–99)
Glucose-Capillary: 229 mg/dL — ABNORMAL HIGH (ref 65–99)
Glucose-Capillary: 165 mg/dL — ABNORMAL HIGH (ref 65–99)
Glucose-Capillary: 154 mg/dL — ABNORMAL HIGH (ref 65–99)

## 2016-06-13 NOTE — Progress Notes (Signed)
PROGRESS NOTE  Margaret Mcdaniel FYB:[id number] DOB: 05-02-1950 DOA: 06/07/2016 PCP: Wende Neighbors, MD  Brief History:  66 year old female with a history of left sided breast cancer, parkinsonism, diabetes mellitus, anxiety and hypothyroidism presents with bilateral lower back pain of 3 days duration.  The patient also has developed fevers and chills up to 102.10F at home. She has also noticed gradually increasing CBGs over the last 3 days at home. She has been taking ibuprofen at home for her fevers and pain. She normally takes ibuprofen up to 10 tablets a week. The patient had some nausea without emesis. Regarding her breast cancer, she was diagnosed on 04/14/2016 with her last chemotherapy on 05/31/2016. In the emergency room, patient was noted have WBC 16.5 with urinalysis showing TNTC WBC. She was started on ciprofloxacin for presumptive pyelonephritis.  Assessment/Plan: Sepsis -Secondary to pyelonephritis -Continue IV fluids -Patient presented with fever, tachycardia, and leukocytosis -Lactic acid 1.8 -Continue IV antibiotics -Blood cultures have shown no growth to date - she is no longer having high fevers and leukocytosis slowly improving - anticipate discharge in next 24 hours  Pyelonephritis -The patient has a listed reaction of anaphylaxis to cephalexin -With the patient's rising WBC and minimal improvement, cipro was discontinued (suspect resistance due to prior exposure) -started aztreonam  -started vancomycin to empirically cover for Enterococcus pending cultures data -CT abd/pelvis done shows constipation, possible rectal prolapse, essentially stable appearance of fluid collection along a calcified anterior abdominal wall mesh, spigelian hernia containing small knuckle of colon without complicating features -Reviewed with general surgery, and no indication that CT findings would cause her current presentation of sepsis. We will continue with intravenous antibiotics for now  and observe. If she fails to improve, can consider further workup. -appears to be slowly clinically improving.  Diarrhea -improving -C. difficile negative. CT scan shows constipation. Continue bowel regimen.  CKD stage 2 to 3 -Baseline creatinine 1.0-1.3 -Creatinine stable at 0.9.  Diabetes Mellitus type 2 -elevated CBGs secondary to infectious process -Continue Lantus 33 units daily -Continue 70/30--23 units with meals -Changed to resistance sliding scale -Blood sugars are stable  Left breast cancer with metastasis to LN -Diagnosed 04/14/2016 -Last chemotherapy therapy 05/31/2016 -Follow up Dr. Talbert Cage outpt  Parkinson's Disease -patient requested outpt neurologist -referral made to movement disorder specialist in [place]  Anxiety/depression -Continue home dose Remeron, lorazepam  Hyperlipidemia -Continue statin  Hypothyroidism -Continue Synthroid -TSH 3.810    Disposition Plan:   Home in 2-3 days  Family Communication:  Discussed with daughter over the phone  Consultants:    Code Status:  FULL   DVT Prophylaxis:  [place] Heparin    Procedures: As Listed in Progress Note Above  Antibiotics: cipro 4/2>>4/3 vanco 4/3>>> Aztreonam 4/3>>>    Subjective: Feeling better. Although back pain is still present, overall she is feeling better   Objective: Vitals:   06/12/16 0519 06/12/16 1300 06/12/16 2100 06/13/16 0423  BP: (!) 147/60 (!) 143/77 133/71 128/63  Pulse: 90 83 76 77  Resp: 18 20 18 18   Temp: 98.5 F (36.9 C) 98.4 F (36.9 C) 98.9 F (37.2 C) 98.4 F (36.9 C)  TempSrc: Oral Oral Oral Oral  SpO2: 97% 95% 96% 96%  Weight:      Height:        Intake/Output Summary (Last 24 hours) at 06/13/16 1446 Last data filed at 06/13/16 0900  Gross per 24 hour  Intake  840 ml  Output                0 ml  Net              840 ml   Weight change:  Exam:   General:  Pt is alert, follows commands appropriately, not in acute distress  HEENT: No  icterus, No thrush, No neck mass, /AT  Cardiovascular: RRR, S1/S2, no rubs, no gallops  Respiratory: CTA bilaterally, no wheezing, no crackles, no rhonchi  Abdomen: Soft/+BS, non tender, non distended, no guarding+ CVA tender  Extremities: No edema, No lymphangitis, No petechiae, No rashes, no synovitis   Data Reviewed: I have personally reviewed following labs and imaging studies Basic Metabolic Panel:  Recent Labs Lab 06/08/16 0627 06/09/16 0603 06/10/16 0607 06/11/16 0507 06/12/16 0638  NA 137 136 140 139 141  K 3.3* 3.6 3.9 3.9 3.7  CL 105 104 108 107 107  CO2 24 23 25 25 27   GLUCOSE 269* 303* 187* 209* 162*  BUN 18 10 9 10 9   CREATININE 1.03* 0.90 0.83 0.82 0.83  CALCIUM 8.1* 8.2* 8.2* 8.2* 8.6*   Liver Function Tests:  Recent Labs Lab 06/07/16 0934 06/07/16 2010  AST 36 29  ALT 27 29  ALKPHOS 84 87  BILITOT 0.6 0.6  PROT 6.3* 6.6  ALBUMIN 3.2* 3.5   No results for input(s): LIPASE, AMYLASE in the last 168 hours. No results for input(s): AMMONIA in the last 168 hours. Coagulation Profile:  Recent Labs Lab 06/07/16 2010  INR 1.05   CBC:  Recent Labs Lab 06/07/16 0934 06/07/16 2010  06/09/16 0603 06/10/16 0607 06/11/16 0507 06/12/16 0638 06/13/16 0606  WBC 8.7 16.5*  < > 33.4* 31.4* 27.8* 22.1* 17.7*  NEUTROABS 2.2 6.1  --  22.7*  --   --   --   --   HGB 12.9 13.0  < > 11.5* 11.6* 11.4* 11.8* 11.7*  HCT 39.4 38.6  < > 35.0* 35.5* 35.7* 36.0 36.3  MCV 85.1 83.9  < > 85.2 86.4 86.9 85.9 86.8  PLT 185 211  < > 207 185 183 160 151  < > = values in this interval not displayed. Cardiac Enzymes: No results for input(s): CKTOTAL, CKMB, CKMBINDEX, TROPONINI in the last 168 hours. BNP: Invalid input(s): POCBNP CBG:  Recent Labs Lab 06/12/16 1123 06/12/16 1611 06/12/16 2104 06/13/16 0743 06/13/16 1139  GLUCAP 215* 224* 161* 148* 229*   HbA1C: No results for input(s): HGBA1C in the last 72 hours. Urine analysis:    Component Value  Date/Time   COLORURINE YELLOW 06/07/2016 1933   APPEARANCEUR CLOUDY (A) 06/07/2016 1933   LABSPEC 1.016 06/07/2016 1933   PHURINE 5.0 06/07/2016 1933   GLUCOSEU >=500 (A) 06/07/2016 1933   HGBUR MODERATE (A) 06/07/2016 1933   BILIRUBINUR NEGATIVE 06/07/2016 1933   KETONESUR 5 (A) 06/07/2016 1933   PROTEINUR NEGATIVE 06/07/2016 1933   UROBILINOGEN 0.2 08/02/2012 1250   NITRITE POSITIVE (A) 06/07/2016 1933   LEUKOCYTESUR LARGE (A) 06/07/2016 1933   Sepsis Labs: @LABRCNTIP (procalcitonin:4,lacticidven:4) ) Recent Results (from the past 240 hour(s))  Urine culture     Status: Abnormal   Collection Time: 06/07/16  7:33 PM  Result Value Ref Range Status   Specimen Description URINE, CLEAN CATCH  Final   Special Requests NONE  Final   Culture MULTIPLE SPECIES PRESENT, SUGGEST RECOLLECTION (A)  Final   Report Status 06/09/2016 FINAL  Final  Culture, blood (Routine x 2)  Status: None   Collection Time: 06/07/16  8:13 PM  Result Value Ref Range Status   Specimen Description BLOOD RIGHT FOREARM  Final   Special Requests   Final    BOTTLES DRAWN AEROBIC AND ANAEROBIC Blood Culture results may not be optimal due to an inadequate volume of blood received in culture bottles   Culture NO GROWTH 5 DAYS  Final   Report Status 06/12/2016 FINAL  Final  Culture, blood (Routine x 2)     Status: None   Collection Time: 06/07/16  8:13 PM  Result Value Ref Range Status   Specimen Description RIGHT ANTECUBITAL  Final   Special Requests   Final    BOTTLES DRAWN AEROBIC AND ANAEROBIC Blood Culture adequate volume   Culture NO GROWTH 5 DAYS  Final   Report Status 06/12/2016 FINAL  Final  C difficile quick scan w PCR reflex     Status: None   Collection Time: 06/08/16  6:10 PM  Result Value Ref Range Status   C Diff antigen NEGATIVE NEGATIVE Final   C Diff toxin NEGATIVE NEGATIVE Final   C Diff interpretation No C. difficile detected.  Final     Scheduled Meds: . allopurinol  100 mg Oral Daily    . aspirin EC  81 mg Oral Daily  . aztreonam  2 g Intravenous Q8H  . chlorproMAZINE  100 mg Oral QHS  . heparin  5,000 Units Subcutaneous Q8H  . insulin aspart  0-15 Units Subcutaneous TID WC  . insulin aspart  0-5 Units Subcutaneous QHS  . insulin aspart protamine- aspart  23 Units Subcutaneous BID WC  . insulin glargine  33 Units Subcutaneous QHS  . levothyroxine  50 mcg Oral QAC breakfast  . mirtazapine  15 mg Oral QHS  . pantoprazole  40 mg Oral Daily  . polyethylene glycol  17 g Oral Daily  . simvastatin  40 mg Oral QHS  . sodium chloride flush  3 mL Intravenous Q12H  . temazepam  15 mg Oral QHS  . vancomycin  1,250 mg Intravenous Q12H   Continuous Infusions:   Procedures/Studies: Dg Chest 2 View  Result Date: 06/07/2016 CLINICAL DATA:  Fever.  Cancer patient. EXAM: CHEST  2 VIEW COMPARISON:  05/26/2016 and 05/14/2016 FINDINGS: Power injectable Port-A-Cath tip:  Upper SVC. Thoracic spondylosis. Left axillary clips in the setting of prior left mastectomy. Atherosclerotic calcification of the aortic arch. No airspace opacity or pleural effusion. Slightly prominent pericardial adipose tissue as on prior exams, particularly along the cardiac apex. IMPRESSION: 1. No pneumonia or thoracic cause for the patient's fever is identified. 2.  Atherosclerotic calcification of the aortic arch. 3. Thoracic spondylosis. 4. Left mastectomy. Electronically Signed   By: Van Clines M.D.   On: 06/07/2016 20:01   Ct Abdomen Pelvis W Contrast  Result Date: 06/09/2016 CLINICAL DATA:  Low back pain for 4 days.  Diarrhea. EXAM: CT ABDOMEN AND PELVIS WITH CONTRAST TECHNIQUE: Multidetector CT imaging of the abdomen and pelvis was performed using the standard protocol following bolus administration of intravenous contrast. CONTRAST:  167mL ISOVUE-300 IOPAMIDOL (ISOVUE-300) INJECTION 61% COMPARISON:  05/14/2016 FINDINGS: Lower chest: Descending thoracic aortic atherosclerotic calcification. Hepatobiliary:  Somewhat low-density liver, possible steatosis. Cholecystectomy. No specific focal liver lesion is identified. Pancreas: Unremarkable Spleen: Unremarkable Adrenals/Urinary Tract: Mild scarring in the left kidney lower pole anteriorly. Several nonobstructive left renal calculi measuring up to 3 mm in diameter in the mid kidney. Slightly dilated right mid ureteral segment without obvious cause. Adrenal  glands unremarkable. Stomach/Bowel: Postoperative findings along the anterior margin of the stomach. Small right Spigelian hernia with a knuckle of ascending colon extending into the hernia on image 35/2, without findings complicating feature. There is formed stool in the sigmoid colon rectum although there is also a small amount of fluid in the rectal vault distally near the anorectal junction. Vascular/Lymphatic: Peripancreatic node 1 cm in short axis on image 24/2. Small periaortic lymph nodes are not enlarged. Aortoiliac atherosclerotic vascular disease. Reproductive: Unremarkable Other: No supplemental non-categorized findings. Musculoskeletal: Ventral density suggesting possible hernia mesh along the anterior abdominal wall with fluid collection along its margin as on image 37/2, not appreciably changed from the earlier exam. Absent coccyx with a flat mesh in the expected location of the coccyx as on image 68/5, with pelvic floor laxity and potentially mild rectal prolapse. L5 is partially sacralized. Facet arthropathy and intervertebral spurring in the lumbar spine along with suspected degenerative disc disease at L3-4 and L4-5 potentially causing mild levels of impingement. IMPRESSION: 1. Possible rectal prolapse with pelvic floor laxity. This is at least partially related to the absence of the coccyx and the posterior mesh implant in the vicinity of the coccyx. In the rectal vault there is both formed stool and some fluid, along with a potentially patulous anorectal junction. 2. Essentially stable appearance of  fluid collection along a calcified anterior abdominal wall mesh. 3. Spigelian hernia on the right contains a small knuckle of colon, without current evidence of complicating feature. 4. Suspected hepatic steatosis. 5.  Aortoiliac atherosclerotic vascular disease. 6. Potential mild levels of impingement in the lumbar spine due to spondylosis and degenerative disc disease. 7. Nonobstructive left nephrolithiasis. Mild scarring in the left kidney lower pole anteriorly. Electronically Signed   By: Van Clines M.D.   On: 06/09/2016 07:33   Dg Chest Port 1 View  Result Date: 05/26/2016 CLINICAL DATA:  Port-A-Cath placement.  Breast cancer. EXAM: PORTABLE CHEST 1 VIEW COMPARISON:  04/12/2016 chest radiograph. FINDINGS: Surgical clips overlie the left axilla. Right subclavian MediPort terminates in the upper third of the superior vena cava. Stable cardiomediastinal silhouette with normal heart size and aortic atherosclerosis. No pneumothorax. No pleural effusion. No overt pulmonary edema. No acute consolidative airspace disease. IMPRESSION: 1. No pneumothorax. 2. Right subclavian Port-A-Cath with tip in the upper third of the SCC. 3. No active cardiopulmonary disease. 4. Aortic atherosclerosis. Electronically Signed   By: Ilona Sorrel M.D.   On: 05/26/2016 11:26   Dg C-arm 1-60 Min-no Report  Result Date: 05/26/2016 Fluoroscopy was utilized by the requesting physician.  No radiographic interpretation.    Lauryn Lizardi,   [facility] Pager [phone]  If 7PM-7AM, please contact night-coverage [email or web address] Password [email or web address] 06/13/2016, 2:46 PM   LOS: 5 days

## 2016-06-14 LAB — CBC
HCT: 36.8 % (ref 36.0–46.0)
HEMOGLOBIN: 12 g/dL (ref 12.0–15.0)
MCH: 28.3 pg (ref 26.0–34.0)
MCHC: 32.6 g/dL (ref 30.0–36.0)
MCV: 86.8 fL (ref 78.0–100.0)
PLATELETS: 133 10*3/uL — AB (ref 150–400)
RBC: 4.24 MIL/uL (ref 3.87–5.11)
RDW: 16.1 % — ABNORMAL HIGH (ref 11.5–15.5)
WBC: 15.4 10*3/uL — AB (ref 4.0–10.5)

## 2016-06-14 LAB — GLUCOSE, CAPILLARY
Glucose-Capillary: 143 mg/dL — ABNORMAL HIGH (ref 65–99)
Glucose-Capillary: 226 mg/dL — ABNORMAL HIGH (ref 65–99)

## 2016-06-14 MED ORDER — LEVOFLOXACIN 750 MG PO TABS: 750.0000 mg | ORAL_TABLET | Freq: Every day | ORAL | 0 refills | Status: DC

## 2016-06-14 NOTE — Discharge Summary (Signed)
Physician Discharge Summary  MI BALLA ZOX:[id number] DOB: 05-31-1950 DOA: 06/07/2016  PCP: Wende Neighbors, MD  Admit date: 06/07/2016 Discharge date: 06/14/2016  Admitted From: home Disposition:  home  Recommendations for Outpatient Follow-up:  1. Follow up with PCP in 1-2 weeks 2. Please obtain BMP/CBC in one week 3. Follow-up with oncology as previously scheduled  Discharge Condition: stable CODE STATUS: full code Diet recommendation: Heart Healthy / Carb Modified   Brief/Interim Summary: 66 year old female with a history of left sided breast cancer, parkinsonism, diabetes mellitus, anxiety and hypothyroidism presents with bilateral lower back pain of 3 days duration.  The patient also has developed fevers and chills up to 102.67F at home. She has also noticed gradually increasing CBGs over the last 3 days at home. She has been taking ibuprofen at home for her fevers and pain. She normally takes ibuprofen up to 10 tablets a week. The patient had some nausea without emesis. Regarding her breast cancer, she was diagnosed on 04/14/2016 with her last chemotherapy on 05/31/2016. In the emergency room, patient was noted have WBC 16.5 with urinalysis showing TNTC WBC. She was started on ciprofloxacin for presumptive pyelonephritis.  Discharge Diagnoses:  Principal Problem:   Pyelonephritis Active Problems:   Breast cancer metastasized to axillary lymph node, left (HCC)   Hypothyroidism   Sepsis (HCC)   CKD (chronic kidney disease) stage 3, GFR 30-59 ml/min   Uncontrolled type 2 diabetes mellitus with hyperglycemia, with long-term current use of insulin (HCC)   Pressure injury of skin  Sepsis -Secondary to pyelonephritis -Patient presented with fever, tachycardia, and leukocytosis -Lactic acid 1.8 -Treated with IV antibiotics and IV fluids -Blood cultures have shown no growth to date - she is no longer having high fevers and leukocytosis slowly improving  Pyelonephritis -The patient  has a listed reaction of anaphylaxis to cephalexin -Patient was initially treated with ciprofloxacin, but leukocytosis began to worsen -She was then switched to estradiol and vancomycin -CT abd/pelvis done showed constipation, possible rectal prolapse, essentially stable appearance of fluid collection along a calcified anterior abdominal wall mesh, spigelian hernia containing small knuckle of colon without complicating features -Reviewed with general surgery, and no indication that CT findings would cause her current presentation of sepsis. She was continued on broad-spectrum antibiotics with slow improvement. Leukocytosis has significantly improved. She is no longer having fevers. Due to antibiotic allergies, by mouth agents are limited. Will change to oral Levaquin. -Back pain has been slowly improving  Diarrhea -improving -C. difficile negative. CT scan shows constipation. Continue bowel regimen. -CT also showed possible rectal prolapse. This was discussed with general surgery and after reviewing the scan, it appears that attempts have been made to repair this in the past. It was not felt that any acute intervention was needed at this time.  CKD stage 2 to 3 -Baseline creatinine 1.0-1.3 -Creatinine stable at 0.9.  Diabetes Mellitus type 2 -elevated CBGs secondary to infectious process -CBGs are doing better. -Resume home regimen on discharge  Left breast cancer with metastasis to LN -Diagnosed 04/14/2016 -Last chemotherapy therapy 05/31/2016 -Follow up Dr. Talbert Cage outpt  Parkinson's Disease -patient requested outpt neurologist -referral made to movement disorder specialist in [place]  Anxiety/depression -Continue home dose Remeron, lorazepam  Hyperlipidemia -Continue statin  Hypothyroidism -Continue Synthroid -TSH 3.810  Discharge Instructions  Discharge Instructions    Ambulatory referral to Neurology    Complete by:  As directed    Refer to [facility] for  parkinson's disease   Diet - low sodium  heart healthy    Complete by:  As directed    Increase activity slowly    Complete by:  As directed      Allergies as of 06/14/2016      Reactions   Keflex [cephalexin] Anaphylaxis   Pt "codes" on this medication.   Sulfa Antibiotics Hives   Latex Rash      Medication List    TAKE these medications   allopurinol 100 MG tablet Commonly known as:  ZYLOPRIM Take 100 mg by mouth daily.   aspirin EC 81 MG tablet Take 81 mg by mouth daily.   chlorproMAZINE 50 MG tablet Commonly known as:  THORAZINE Take 100 mg by mouth at bedtime.   CYCLOPHOSPHAMIDE IV Inject into the vein. Every 3 weeks   dexamethasone 4 MG tablet Commonly known as:  DECADRON Take 2 tablets (8 mg total) by mouth 2 (two) times daily. Start the day before Taxotere. Then again the day after chemo for 3 days.   HUMALOG MIX 75/25 (75-25) 100 UNIT/ML Susp injection Generic drug:  insulin lispro protamine-lispro Inject 25 Units into the skin 2 (two) times daily with a meal.   HYDROcodone-acetaminophen 5-325 MG tablet Commonly known as:  NORCO Take 1 tablet by mouth every 6 (six) hours as needed for moderate pain.   levofloxacin 750 MG tablet Commonly known as:  LEVAQUIN Take 1 tablet (750 mg total) by mouth daily.   levothyroxine 50 MCG tablet Commonly known as:  SYNTHROID, LEVOTHROID Take 50 mcg by mouth daily before breakfast.   lidocaine-prilocaine cream Commonly known as:  EMLA Apply to affected area once What changed:  how much to take  how to take this  when to take this  additional instructions   LORazepam 0.5 MG tablet Commonly known as:  ATIVAN Take 1 tablet (0.5 mg total) by mouth every 6 (six) hours as needed (Nausea or vomiting).   mirtazapine 15 MG tablet Commonly known as:  REMERON Take 15 mg by mouth at bedtime.   NEULASTA ONPRO [place] Inject into the skin. Every 21 days   omeprazole 40 MG capsule Commonly known as:  PRILOSEC Take 40 mg  by mouth daily.   ondansetron 8 MG tablet Commonly known as:  ZOFRAN Take 1 tablet (8 mg total) by mouth 2 (two) times daily as needed for refractory nausea / vomiting. Start on day 3 after chemo.   polyethylene glycol powder powder Commonly known as:  GLYCOLAX/MIRALAX Take 17 g by mouth daily.   prochlorperazine 10 MG tablet Commonly known as:  COMPAZINE Take 1 tablet (10 mg total) by mouth every 6 (six) hours as needed (Nausea or vomiting).   RESTORIL 15 MG capsule Generic drug:  temazepam Take 15 mg by mouth at bedtime.   simvastatin 40 MG tablet Commonly known as:  ZOCOR Take 40 mg by mouth at bedtime.   TAXOTERE IV Inject into the vein. Every 3 weeks   tiZANidine 4 MG tablet Commonly known as:  ZANAFLEX Take 8 mg by mouth 2 (two) times daily.   TRESIBA FLEXTOUCH 100 UNIT/ML Sopn FlexTouch Pen Generic drug:  insulin degludec Inject 30 Units into the skin at bedtime.            Durable Medical Equipment        Start     Ordered   06/08/16 1241  For home use only DME 3 n 1  Once     06/08/16 1240      Allergies  Allergen Reactions  .  Keflex [Cephalexin] Anaphylaxis    Pt "codes" on this medication.  . Sulfa Antibiotics Hives  . Latex Rash    Consultations:     Procedures/Studies: Dg Chest 2 View  Result Date: 06/07/2016 CLINICAL DATA:  Fever.  Cancer patient. EXAM: CHEST  2 VIEW COMPARISON:  05/26/2016 and 05/14/2016 FINDINGS: Power injectable Port-A-Cath tip:  Upper SVC. Thoracic spondylosis. Left axillary clips in the setting of prior left mastectomy. Atherosclerotic calcification of the aortic arch. No airspace opacity or pleural effusion. Slightly prominent pericardial adipose tissue as on prior exams, particularly along the cardiac apex. IMPRESSION: 1. No pneumonia or thoracic cause for the patient's fever is identified. 2.  Atherosclerotic calcification of the aortic arch. 3. Thoracic spondylosis. 4. Left mastectomy. Electronically Signed   By:  Van Clines M.D.   On: 06/07/2016 20:01   Ct Abdomen Pelvis W Contrast  Result Date: 06/09/2016 CLINICAL DATA:  Low back pain for 4 days.  Diarrhea. EXAM: CT ABDOMEN AND PELVIS WITH CONTRAST TECHNIQUE: Multidetector CT imaging of the abdomen and pelvis was performed using the standard protocol following bolus administration of intravenous contrast. CONTRAST:  18mL ISOVUE-300 IOPAMIDOL (ISOVUE-300) INJECTION 61% COMPARISON:  05/14/2016 FINDINGS: Lower chest: Descending thoracic aortic atherosclerotic calcification. Hepatobiliary: Somewhat low-density liver, possible steatosis. Cholecystectomy. No specific focal liver lesion is identified. Pancreas: Unremarkable Spleen: Unremarkable Adrenals/Urinary Tract: Mild scarring in the left kidney lower pole anteriorly. Several nonobstructive left renal calculi measuring up to 3 mm in diameter in the mid kidney. Slightly dilated right mid ureteral segment without obvious cause. Adrenal glands unremarkable. Stomach/Bowel: Postoperative findings along the anterior margin of the stomach. Small right Spigelian hernia with a knuckle of ascending colon extending into the hernia on image 35/2, without findings complicating feature. There is formed stool in the sigmoid colon rectum although there is also a small amount of fluid in the rectal vault distally near the anorectal junction. Vascular/Lymphatic: Peripancreatic node 1 cm in short axis on image 24/2. Small periaortic lymph nodes are not enlarged. Aortoiliac atherosclerotic vascular disease. Reproductive: Unremarkable Other: No supplemental non-categorized findings. Musculoskeletal: Ventral density suggesting possible hernia mesh along the anterior abdominal wall with fluid collection along its margin as on image 37/2, not appreciably changed from the earlier exam. Absent coccyx with a flat mesh in the expected location of the coccyx as on image 68/5, with pelvic floor laxity and potentially mild rectal prolapse. L5  is partially sacralized. Facet arthropathy and intervertebral spurring in the lumbar spine along with suspected degenerative disc disease at L3-4 and L4-5 potentially causing mild levels of impingement. IMPRESSION: 1. Possible rectal prolapse with pelvic floor laxity. This is at least partially related to the absence of the coccyx and the posterior mesh implant in the vicinity of the coccyx. In the rectal vault there is both formed stool and some fluid, along with a potentially patulous anorectal junction. 2. Essentially stable appearance of fluid collection along a calcified anterior abdominal wall mesh. 3. Spigelian hernia on the right contains a small knuckle of colon, without current evidence of complicating feature. 4. Suspected hepatic steatosis. 5.  Aortoiliac atherosclerotic vascular disease. 6. Potential mild levels of impingement in the lumbar spine due to spondylosis and degenerative disc disease. 7. Nonobstructive left nephrolithiasis. Mild scarring in the left kidney lower pole anteriorly. Electronically Signed   By: Van Clines M.D.   On: 06/09/2016 07:33   Dg Chest Port 1 View  Result Date: 05/26/2016 CLINICAL DATA:  Port-A-Cath placement.  Breast cancer. EXAM: PORTABLE CHEST 1  VIEW COMPARISON:  04/12/2016 chest radiograph. FINDINGS: Surgical clips overlie the left axilla. Right subclavian MediPort terminates in the upper third of the superior vena cava. Stable cardiomediastinal silhouette with normal heart size and aortic atherosclerosis. No pneumothorax. No pleural effusion. No overt pulmonary edema. No acute consolidative airspace disease. IMPRESSION: 1. No pneumothorax. 2. Right subclavian Port-A-Cath with tip in the upper third of the SCC. 3. No active cardiopulmonary disease. 4. Aortic atherosclerosis. Electronically Signed   By: Ilona Sorrel M.D.   On: 05/26/2016 11:26   Dg C-arm 1-60 Min-no Report  Result Date: 05/26/2016 Fluoroscopy was utilized by the requesting physician.  No  radiographic interpretation.      Subjective: Back pain is improving. Diarrhea is better. Overall she is feeling better.  Discharge Exam: Vitals:   06/13/16 2038 06/14/16 0330  BP: 140/64 127/62  Pulse: 65 85  Resp: 20 (!) 21  Temp: 99.3 F (37.4 C) 97.8 F (36.6 C)   Vitals:   06/13/16 0423 06/13/16 1300 06/13/16 2038 06/14/16 0330  BP: 128/63 (!) 157/96 140/64 127/62  Pulse: 77 100 65 85  Resp: 18 18 20  (!) 21  Temp: 98.4 F (36.9 C) 98.3 F (36.8 C) 99.3 F (37.4 C) 97.8 F (36.6 C)  TempSrc: Oral Oral Oral Oral  SpO2: 96% 97% 97% 95%  Weight:      Height:        General: Pt is alert, awake, not in acute distress Cardiovascular: RRR, S1/S2 +, no rubs, no gallops Respiratory: CTA bilaterally, no wheezing, no rhonchi Abdominal: Soft, NT, ND, bowel sounds + Extremities: no edema, no cyanosis    The results of significant diagnostics from this hospitalization (including imaging, microbiology, ancillary and laboratory) are listed below for reference.     Microbiology: Recent Results (from the past 240 hour(s))  Urine culture     Status: Abnormal   Collection Time: 06/07/16  7:33 PM  Result Value Ref Range Status   Specimen Description URINE, CLEAN CATCH  Final   Special Requests NONE  Final   Culture MULTIPLE SPECIES PRESENT, SUGGEST RECOLLECTION (A)  Final   Report Status 06/09/2016 FINAL  Final  Culture, blood (Routine x 2)     Status: None   Collection Time: 06/07/16  8:13 PM  Result Value Ref Range Status   Specimen Description BLOOD RIGHT FOREARM  Final   Special Requests   Final    BOTTLES DRAWN AEROBIC AND ANAEROBIC Blood Culture results may not be optimal due to an inadequate volume of blood received in culture bottles   Culture NO GROWTH 5 DAYS  Final   Report Status 06/12/2016 FINAL  Final  Culture, blood (Routine x 2)     Status: None   Collection Time: 06/07/16  8:13 PM  Result Value Ref Range Status   Specimen Description RIGHT ANTECUBITAL   Final   Special Requests   Final    BOTTLES DRAWN AEROBIC AND ANAEROBIC Blood Culture adequate volume   Culture NO GROWTH 5 DAYS  Final   Report Status 06/12/2016 FINAL  Final  C difficile quick scan w PCR reflex     Status: None   Collection Time: 06/08/16  6:10 PM  Result Value Ref Range Status   C Diff antigen NEGATIVE NEGATIVE Final   C Diff toxin NEGATIVE NEGATIVE Final   C Diff interpretation No C. difficile detected.  Final     Labs: BNP (last 3 results) No results for input(s): BNP in the last 8760 hours. Basic  Metabolic Panel:  Recent Labs Lab 06/08/16 0627 06/09/16 0603 06/10/16 0607 06/11/16 0507 06/12/16 0638  NA 137 136 140 139 141  K 3.3* 3.6 3.9 3.9 3.7  CL 105 104 108 107 107  CO2 24 23 25 25 27   GLUCOSE 269* 303* 187* 209* 162*  BUN 18 10 9 10 9   CREATININE 1.03* 0.90 0.83 0.82 0.83  CALCIUM 8.1* 8.2* 8.2* 8.2* 8.6*   Liver Function Tests:  Recent Labs Lab 06/07/16 2010  AST 29  ALT 29  ALKPHOS 87  BILITOT 0.6  PROT 6.6  ALBUMIN 3.5   No results for input(s): LIPASE, AMYLASE in the last 168 hours. No results for input(s): AMMONIA in the last 168 hours. CBC:  Recent Labs Lab 06/07/16 2010  06/09/16 0603 06/10/16 0626 06/11/16 0507 06/12/16 9485 06/13/16 0606 06/14/16 0502  WBC 16.5*  < > 33.4* 31.4* 27.8* 22.1* 17.7* 15.4*  NEUTROABS 6.1  --  22.7*  --   --   --   --   --   HGB 13.0  < > 11.5* 11.6* 11.4* 11.8* 11.7* 12.0  HCT 38.6  < > 35.0* 35.5* 35.7* 36.0 36.3 36.8  MCV 83.9  < > 85.2 86.4 86.9 85.9 86.8 86.8  PLT 211  < > 207 185 183 160 151 133*  < > = values in this interval not displayed. Cardiac Enzymes: No results for input(s): CKTOTAL, CKMB, CKMBINDEX, TROPONINI in the last 168 hours. BNP: Invalid input(s): POCBNP CBG:  Recent Labs Lab 06/13/16 1139 06/13/16 1641 06/13/16 2051 06/14/16 0742 06/14/16 1128  GLUCAP 229* 165* 154* 143* 226*   D-Dimer No results for input(s): DDIMER in the last 72 hours. Hgb  A1c No results for input(s): HGBA1C in the last 72 hours. Lipid Profile No results for input(s): CHOL, HDL, LDLCALC, TRIG, CHOLHDL, LDLDIRECT in the last 72 hours. Thyroid function studies No results for input(s): TSH, T4TOTAL, T3FREE, THYROIDAB in the last 72 hours.  Invalid input(s): FREET3 Anemia work up No results for input(s): VITAMINB12, FOLATE, FERRITIN, TIBC, IRON, RETICCTPCT in the last 72 hours. Urinalysis    Component Value Date/Time   COLORURINE YELLOW 06/07/2016 1933   APPEARANCEUR CLOUDY (A) 06/07/2016 1933   LABSPEC 1.016 06/07/2016 1933   PHURINE 5.0 06/07/2016 1933   GLUCOSEU >=500 (A) 06/07/2016 1933   HGBUR MODERATE (A) 06/07/2016 1933   BILIRUBINUR NEGATIVE 06/07/2016 1933   KETONESUR 5 (A) 06/07/2016 1933   PROTEINUR NEGATIVE 06/07/2016 1933   UROBILINOGEN 0.2 08/02/2012 1250   NITRITE POSITIVE (A) 06/07/2016 1933   LEUKOCYTESUR LARGE (A) 06/07/2016 1933   Sepsis Labs Invalid input(s): PROCALCITONIN,  WBC,  LACTICIDVEN Microbiology Recent Results (from the past 240 hour(s))  Urine culture     Status: Abnormal   Collection Time: 06/07/16  7:33 PM  Result Value Ref Range Status   Specimen Description URINE, CLEAN CATCH  Final   Special Requests NONE  Final   Culture MULTIPLE SPECIES PRESENT, SUGGEST RECOLLECTION (A)  Final   Report Status 06/09/2016 FINAL  Final  Culture, blood (Routine x 2)     Status: None   Collection Time: 06/07/16  8:13 PM  Result Value Ref Range Status   Specimen Description BLOOD RIGHT FOREARM  Final   Special Requests   Final    BOTTLES DRAWN AEROBIC AND ANAEROBIC Blood Culture results may not be optimal due to an inadequate volume of blood received in culture bottles   Culture NO GROWTH 5 DAYS  Final   Report  Status 06/12/2016 FINAL  Final  Culture, blood (Routine x 2)     Status: None   Collection Time: 06/07/16  8:13 PM  Result Value Ref Range Status   Specimen Description RIGHT ANTECUBITAL  Final   Special Requests    Final    BOTTLES DRAWN AEROBIC AND ANAEROBIC Blood Culture adequate volume   Culture NO GROWTH 5 DAYS  Final   Report Status 06/12/2016 FINAL  Final  C difficile quick scan w PCR reflex     Status: None   Collection Time: 06/08/16  6:10 PM  Result Value Ref Range Status   C Diff antigen NEGATIVE NEGATIVE Final   C Diff toxin NEGATIVE NEGATIVE Final   C Diff interpretation No C. difficile detected.  Final     Time coordinating discharge: Over 30 minutes  SIGNED:   Kathie Dike, MD  [facility] 06/14/2016, 5:47 PM Pager   If 7PM-7AM, please contact night-coverage [email or web address] Password [email or web address]

## 2016-06-14 NOTE — Care Management Important Message (Signed)
Important Message  Patient Details  Name: Margaret Mcdaniel MRN: [id number] Date of Birth: [birth year]   Medicare Important Message Given:  Yes    Sherald Barge, RN 06/14/2016, 1:53 PM

## 2016-06-14 NOTE — Care Management Note (Signed)
Case Management Note  Patient Details  Name: Margaret Mcdaniel MRN: [id number] Date of Birth: [birth year]  Expected Discharge Date:  06/14/16               Expected Discharge Plan:  Home/Self Care  In-House Referral:  NA  Discharge planning Services  CM Consult  Post Acute Care Choice:  Durable Medical Equipment Choice offered to:  Adult Children  DME Arranged:  3-N-1 DME Agency:  [place].  Status of Service:  Completed, signed off  Additional Comments: Pt discharging home with self care today. BSC has been delivered to room. Family not interested in [facility] services at this time. They request to make f/u appointments. RN made aware. DIL [name], will pick pt up.   Sherald Barge, RN 06/14/2016, 1:51 PM

## 2016-06-14 NOTE — Progress Notes (Deleted)
Physician Discharge Summary  Margaret Mcdaniel:[id number] DOB: October 25, 1950 DOA: 06/07/2016  PCP: Wende Neighbors, MD  Admit date: 06/07/2016 Discharge date: 06/14/2016  Admitted From: home Disposition:  home  Recommendations for Outpatient Follow-up:  1. Follow up with PCP in 1-2 weeks 2. Please obtain BMP/CBC in one week 3. Follow-up with oncology as previously scheduled  Discharge Condition: stable CODE STATUS: full code Diet recommendation: Heart Healthy / Carb Modified   Brief/Interim Summary: 66 year old female with a history of left sided breast cancer, parkinsonism, diabetes mellitus, anxiety and hypothyroidism presents with bilateral lower back pain of 3 days duration.  The patient also has developed fevers and chills up to 102.27F at home. She has also noticed gradually increasing CBGs over the last 3 days at home. She has been taking ibuprofen at home for her fevers and pain. She normally takes ibuprofen up to 10 tablets a week. The patient had some nausea without emesis. Regarding her breast cancer, she was diagnosed on 04/14/2016 with her last chemotherapy on 05/31/2016. In the emergency room, patient was noted have WBC 16.5 with urinalysis showing TNTC WBC. She was started on ciprofloxacin for presumptive pyelonephritis.  Discharge Diagnoses:  Principal Problem:   Pyelonephritis Active Problems:   Breast cancer metastasized to axillary lymph node, left (HCC)   Hypothyroidism   Sepsis (HCC)   CKD (chronic kidney disease) stage 3, GFR 30-59 ml/min   Uncontrolled type 2 diabetes mellitus with hyperglycemia, with long-term current use of insulin (HCC)   Pressure injury of skin  Sepsis -Secondary to pyelonephritis -Patient presented with fever, tachycardia, and leukocytosis -Lactic acid 1.8 -Treated with IV antibiotics and IV fluids -Blood cultures have shown no growth to date - she is no longer having high fevers and leukocytosis slowly improving  Pyelonephritis -The patient  has a listed reaction of anaphylaxis to cephalexin -Patient was initially treated with ciprofloxacin, but leukocytosis began to worsen -She was then switched to estradiol and vancomycin -CT abd/pelvis done showed constipation, possible rectal prolapse, essentially stable appearance of fluid collection along a calcified anterior abdominal wall mesh, spigelian hernia containing small knuckle of colon without complicating features -Reviewed with general surgery, and no indication that CT findings would cause her current presentation of sepsis. She was continued on broad-spectrum antibiotics with slow improvement. Leukocytosis has significantly improved. She is no longer having fevers. Due to antibiotic allergies, by mouth agents are limited. Will change to oral Levaquin. -Back pain has been slowly improving  Diarrhea -improving -C. difficile negative. CT scan shows constipation. Continue bowel regimen. -CT also showed possible rectal prolapse. This was discussed with general surgery and after reviewing the scan, it appears that attempts have been made to repair this in the past. It was not felt that any acute intervention was needed at this time.  CKD stage 2 to 3 -Baseline creatinine 1.0-1.3 -Creatinine stable at 0.9.  Diabetes Mellitus type 2 -elevated CBGs secondary to infectious process -CBGs are doing better. -Resume home regimen on discharge  Left breast cancer with metastasis to LN -Diagnosed 04/14/2016 -Last chemotherapy therapy 05/31/2016 -Follow up Dr. Talbert Cage outpt  Parkinson's Disease -patient requested outpt neurologist -referral made to movement disorder specialist in [place]  Anxiety/depression -Continue home dose Remeron, lorazepam  Hyperlipidemia -Continue statin  Hypothyroidism -Continue Synthroid -TSH 3.810  Discharge Instructions  Discharge Instructions    Ambulatory referral to Neurology    Complete by:  As directed    Refer to [facility] for  parkinson's disease   Diet - low sodium  heart healthy    Complete by:  As directed    Increase activity slowly    Complete by:  As directed      Allergies as of 06/14/2016      Reactions   Keflex [cephalexin] Anaphylaxis   Pt "codes" on this medication.   Sulfa Antibiotics Hives   Latex Rash      Medication List    TAKE these medications   allopurinol 100 MG tablet Commonly known as:  ZYLOPRIM Take 100 mg by mouth daily.   aspirin EC 81 MG tablet Take 81 mg by mouth daily.   chlorproMAZINE 50 MG tablet Commonly known as:  THORAZINE Take 100 mg by mouth at bedtime.   CYCLOPHOSPHAMIDE IV Inject into the vein. Every 3 weeks   dexamethasone 4 MG tablet Commonly known as:  DECADRON Take 2 tablets (8 mg total) by mouth 2 (two) times daily. Start the day before Taxotere. Then again the day after chemo for 3 days.   HUMALOG MIX 75/25 (75-25) 100 UNIT/ML Susp injection Generic drug:  insulin lispro protamine-lispro Inject 25 Units into the skin 2 (two) times daily with a meal.   HYDROcodone-acetaminophen 5-325 MG tablet Commonly known as:  NORCO Take 1 tablet by mouth every 6 (six) hours as needed for moderate pain.   levofloxacin 750 MG tablet Commonly known as:  LEVAQUIN Take 1 tablet (750 mg total) by mouth daily.   levothyroxine 50 MCG tablet Commonly known as:  SYNTHROID, LEVOTHROID Take 50 mcg by mouth daily before breakfast.   lidocaine-prilocaine cream Commonly known as:  EMLA Apply to affected area once What changed:  how much to take  how to take this  when to take this  additional instructions   LORazepam 0.5 MG tablet Commonly known as:  ATIVAN Take 1 tablet (0.5 mg total) by mouth every 6 (six) hours as needed (Nausea or vomiting).   mirtazapine 15 MG tablet Commonly known as:  REMERON Take 15 mg by mouth at bedtime.   NEULASTA ONPRO [place] Inject into the skin. Every 21 days   omeprazole 40 MG capsule Commonly known as:  PRILOSEC Take 40 mg  by mouth daily.   ondansetron 8 MG tablet Commonly known as:  ZOFRAN Take 1 tablet (8 mg total) by mouth 2 (two) times daily as needed for refractory nausea / vomiting. Start on day 3 after chemo.   polyethylene glycol powder powder Commonly known as:  GLYCOLAX/MIRALAX Take 17 g by mouth daily.   prochlorperazine 10 MG tablet Commonly known as:  COMPAZINE Take 1 tablet (10 mg total) by mouth every 6 (six) hours as needed (Nausea or vomiting).   RESTORIL 15 MG capsule Generic drug:  temazepam Take 15 mg by mouth at bedtime.   simvastatin 40 MG tablet Commonly known as:  ZOCOR Take 40 mg by mouth at bedtime.   TAXOTERE IV Inject into the vein. Every 3 weeks   tiZANidine 4 MG tablet Commonly known as:  ZANAFLEX Take 8 mg by mouth 2 (two) times daily.   TRESIBA FLEXTOUCH 100 UNIT/ML Sopn FlexTouch Pen Generic drug:  insulin degludec Inject 30 Units into the skin at bedtime.            Durable Medical Equipment        Start     Ordered   06/08/16 1241  For home use only DME 3 n 1  Once     06/08/16 1240      Allergies  Allergen Reactions  .  Keflex [Cephalexin] Anaphylaxis    Pt "codes" on this medication.  . Sulfa Antibiotics Hives  . Latex Rash    Consultations:     Procedures/Studies: Dg Chest 2 View  Result Date: 06/07/2016 CLINICAL DATA:  Fever.  Cancer patient. EXAM: CHEST  2 VIEW COMPARISON:  05/26/2016 and 05/14/2016 FINDINGS: Power injectable Port-A-Cath tip:  Upper SVC. Thoracic spondylosis. Left axillary clips in the setting of prior left mastectomy. Atherosclerotic calcification of the aortic arch. No airspace opacity or pleural effusion. Slightly prominent pericardial adipose tissue as on prior exams, particularly along the cardiac apex. IMPRESSION: 1. No pneumonia or thoracic cause for the patient's fever is identified. 2.  Atherosclerotic calcification of the aortic arch. 3. Thoracic spondylosis. 4. Left mastectomy. Electronically Signed   By:  Van Clines M.D.   On: 06/07/2016 20:01   Ct Abdomen Pelvis W Contrast  Result Date: 06/09/2016 CLINICAL DATA:  Low back pain for 4 days.  Diarrhea. EXAM: CT ABDOMEN AND PELVIS WITH CONTRAST TECHNIQUE: Multidetector CT imaging of the abdomen and pelvis was performed using the standard protocol following bolus administration of intravenous contrast. CONTRAST:  181mL ISOVUE-300 IOPAMIDOL (ISOVUE-300) INJECTION 61% COMPARISON:  05/14/2016 FINDINGS: Lower chest: Descending thoracic aortic atherosclerotic calcification. Hepatobiliary: Somewhat low-density liver, possible steatosis. Cholecystectomy. No specific focal liver lesion is identified. Pancreas: Unremarkable Spleen: Unremarkable Adrenals/Urinary Tract: Mild scarring in the left kidney lower pole anteriorly. Several nonobstructive left renal calculi measuring up to 3 mm in diameter in the mid kidney. Slightly dilated right mid ureteral segment without obvious cause. Adrenal glands unremarkable. Stomach/Bowel: Postoperative findings along the anterior margin of the stomach. Small right Spigelian hernia with a knuckle of ascending colon extending into the hernia on image 35/2, without findings complicating feature. There is formed stool in the sigmoid colon rectum although there is also a small amount of fluid in the rectal vault distally near the anorectal junction. Vascular/Lymphatic: Peripancreatic node 1 cm in short axis on image 24/2. Small periaortic lymph nodes are not enlarged. Aortoiliac atherosclerotic vascular disease. Reproductive: Unremarkable Other: No supplemental non-categorized findings. Musculoskeletal: Ventral density suggesting possible hernia mesh along the anterior abdominal wall with fluid collection along its margin as on image 37/2, not appreciably changed from the earlier exam. Absent coccyx with a flat mesh in the expected location of the coccyx as on image 68/5, with pelvic floor laxity and potentially mild rectal prolapse. L5  is partially sacralized. Facet arthropathy and intervertebral spurring in the lumbar spine along with suspected degenerative disc disease at L3-4 and L4-5 potentially causing mild levels of impingement. IMPRESSION: 1. Possible rectal prolapse with pelvic floor laxity. This is at least partially related to the absence of the coccyx and the posterior mesh implant in the vicinity of the coccyx. In the rectal vault there is both formed stool and some fluid, along with a potentially patulous anorectal junction. 2. Essentially stable appearance of fluid collection along a calcified anterior abdominal wall mesh. 3. Spigelian hernia on the right contains a small knuckle of colon, without current evidence of complicating feature. 4. Suspected hepatic steatosis. 5.  Aortoiliac atherosclerotic vascular disease. 6. Potential mild levels of impingement in the lumbar spine due to spondylosis and degenerative disc disease. 7. Nonobstructive left nephrolithiasis. Mild scarring in the left kidney lower pole anteriorly. Electronically Signed   By: Van Clines M.D.   On: 06/09/2016 07:33   Dg Chest Port 1 View  Result Date: 05/26/2016 CLINICAL DATA:  Port-A-Cath placement.  Breast cancer. EXAM: PORTABLE CHEST 1  VIEW COMPARISON:  04/12/2016 chest radiograph. FINDINGS: Surgical clips overlie the left axilla. Right subclavian MediPort terminates in the upper third of the superior vena cava. Stable cardiomediastinal silhouette with normal heart size and aortic atherosclerosis. No pneumothorax. No pleural effusion. No overt pulmonary edema. No acute consolidative airspace disease. IMPRESSION: 1. No pneumothorax. 2. Right subclavian Port-A-Cath with tip in the upper third of the SCC. 3. No active cardiopulmonary disease. 4. Aortic atherosclerosis. Electronically Signed   By: Ilona Sorrel M.D.   On: 05/26/2016 11:26   Dg C-arm 1-60 Min-no Report  Result Date: 05/26/2016 Fluoroscopy was utilized by the requesting physician.  No  radiographic interpretation.       Subjective: Back pain is improving. Diarrhea is better. Overall she is feeling better.  Discharge Exam: Vitals:   06/13/16 2038 06/14/16 0330  BP: 140/64 127/62  Pulse: 65 85  Resp: 20 (!) 21  Temp: 99.3 F (37.4 C) 97.8 F (36.6 C)   Vitals:   06/13/16 0423 06/13/16 1300 06/13/16 2038 06/14/16 0330  BP: 128/63 (!) 157/96 140/64 127/62  Pulse: 77 100 65 85  Resp: 18 18 20  (!) 21  Temp: 98.4 F (36.9 C) 98.3 F (36.8 C) 99.3 F (37.4 C) 97.8 F (36.6 C)  TempSrc: Oral Oral Oral Oral  SpO2: 96% 97% 97% 95%  Weight:      Height:        General: Pt is alert, awake, not in acute distress Cardiovascular: RRR, S1/S2 +, no rubs, no gallops Respiratory: CTA bilaterally, no wheezing, no rhonchi Abdominal: Soft, NT, ND, bowel sounds + Extremities: no edema, no cyanosis    The results of significant diagnostics from this hospitalization (including imaging, microbiology, ancillary and laboratory) are listed below for reference.     Microbiology: Recent Results (from the past 240 hour(s))  Urine culture     Status: Abnormal   Collection Time: 06/07/16  7:33 PM  Result Value Ref Range Status   Specimen Description URINE, CLEAN CATCH  Final   Special Requests NONE  Final   Culture MULTIPLE SPECIES PRESENT, SUGGEST RECOLLECTION (A)  Final   Report Status 06/09/2016 FINAL  Final  Culture, blood (Routine x 2)     Status: None   Collection Time: 06/07/16  8:13 PM  Result Value Ref Range Status   Specimen Description BLOOD RIGHT FOREARM  Final   Special Requests   Final    BOTTLES DRAWN AEROBIC AND ANAEROBIC Blood Culture results may not be optimal due to an inadequate volume of blood received in culture bottles   Culture NO GROWTH 5 DAYS  Final   Report Status 06/12/2016 FINAL  Final  Culture, blood (Routine x 2)     Status: None   Collection Time: 06/07/16  8:13 PM  Result Value Ref Range Status   Specimen Description RIGHT ANTECUBITAL   Final   Special Requests   Final    BOTTLES DRAWN AEROBIC AND ANAEROBIC Blood Culture adequate volume   Culture NO GROWTH 5 DAYS  Final   Report Status 06/12/2016 FINAL  Final  C difficile quick scan w PCR reflex     Status: None   Collection Time: 06/08/16  6:10 PM  Result Value Ref Range Status   C Diff antigen NEGATIVE NEGATIVE Final   C Diff toxin NEGATIVE NEGATIVE Final   C Diff interpretation No C. difficile detected.  Final     Labs: BNP (last 3 results) No results for input(s): BNP in the last 8760 hours.  Basic Metabolic Panel:  Recent Labs Lab 06/08/16 0627 06/09/16 0603 06/10/16 0607 06/11/16 0507 06/12/16 0638  NA 137 136 140 139 141  K 3.3* 3.6 3.9 3.9 3.7  CL 105 104 108 107 107  CO2 24 23 25 25 27   GLUCOSE 269* 303* 187* 209* 162*  BUN 18 10 9 10 9   CREATININE 1.03* 0.90 0.83 0.82 0.83  CALCIUM 8.1* 8.2* 8.2* 8.2* 8.6*   Liver Function Tests:  Recent Labs Lab 06/07/16 2010  AST 29  ALT 29  ALKPHOS 87  BILITOT 0.6  PROT 6.6  ALBUMIN 3.5   No results for input(s): LIPASE, AMYLASE in the last 168 hours. No results for input(s): AMMONIA in the last 168 hours. CBC:  Recent Labs Lab 06/07/16 2010  06/09/16 0603 06/10/16 4401 06/11/16 0507 06/12/16 0272 06/13/16 0606 06/14/16 0502  WBC 16.5*  < > 33.4* 31.4* 27.8* 22.1* 17.7* 15.4*  NEUTROABS 6.1  --  22.7*  --   --   --   --   --   HGB 13.0  < > 11.5* 11.6* 11.4* 11.8* 11.7* 12.0  HCT 38.6  < > 35.0* 35.5* 35.7* 36.0 36.3 36.8  MCV 83.9  < > 85.2 86.4 86.9 85.9 86.8 86.8  PLT 211  < > 207 185 183 160 151 133*  < > = values in this interval not displayed. Cardiac Enzymes: No results for input(s): CKTOTAL, CKMB, CKMBINDEX, TROPONINI in the last 168 hours. BNP: Invalid input(s): POCBNP CBG:  Recent Labs Lab 06/13/16 1139 06/13/16 1641 06/13/16 2051 06/14/16 0742 06/14/16 1128  GLUCAP 229* 165* 154* 143* 226*   D-Dimer No results for input(s): DDIMER in the last 72 hours. Hgb  A1c No results for input(s): HGBA1C in the last 72 hours. Lipid Profile No results for input(s): CHOL, HDL, LDLCALC, TRIG, CHOLHDL, LDLDIRECT in the last 72 hours. Thyroid function studies No results for input(s): TSH, T4TOTAL, T3FREE, THYROIDAB in the last 72 hours.  Invalid input(s): FREET3 Anemia work up No results for input(s): VITAMINB12, FOLATE, FERRITIN, TIBC, IRON, RETICCTPCT in the last 72 hours. Urinalysis    Component Value Date/Time   COLORURINE YELLOW 06/07/2016 1933   APPEARANCEUR CLOUDY (A) 06/07/2016 1933   LABSPEC 1.016 06/07/2016 1933   PHURINE 5.0 06/07/2016 1933   GLUCOSEU >=500 (A) 06/07/2016 1933   HGBUR MODERATE (A) 06/07/2016 1933   BILIRUBINUR NEGATIVE 06/07/2016 1933   KETONESUR 5 (A) 06/07/2016 1933   PROTEINUR NEGATIVE 06/07/2016 1933   UROBILINOGEN 0.2 08/02/2012 1250   NITRITE POSITIVE (A) 06/07/2016 1933   LEUKOCYTESUR LARGE (A) 06/07/2016 1933   Sepsis Labs Invalid input(s): PROCALCITONIN,  WBC,  LACTICIDVEN Microbiology Recent Results (from the past 240 hour(s))  Urine culture     Status: Abnormal   Collection Time: 06/07/16  7:33 PM  Result Value Ref Range Status   Specimen Description URINE, CLEAN CATCH  Final   Special Requests NONE  Final   Culture MULTIPLE SPECIES PRESENT, SUGGEST RECOLLECTION (A)  Final   Report Status 06/09/2016 FINAL  Final  Culture, blood (Routine x 2)     Status: None   Collection Time: 06/07/16  8:13 PM  Result Value Ref Range Status   Specimen Description BLOOD RIGHT FOREARM  Final   Special Requests   Final    BOTTLES DRAWN AEROBIC AND ANAEROBIC Blood Culture results may not be optimal due to an inadequate volume of blood received in culture bottles   Culture NO GROWTH 5 DAYS  Final  Report Status 06/12/2016 FINAL  Final  Culture, blood (Routine x 2)     Status: None   Collection Time: 06/07/16  8:13 PM  Result Value Ref Range Status   Specimen Description RIGHT ANTECUBITAL  Final   Special Requests    Final    BOTTLES DRAWN AEROBIC AND ANAEROBIC Blood Culture adequate volume   Culture NO GROWTH 5 DAYS  Final   Report Status 06/12/2016 FINAL  Final  C difficile quick scan w PCR reflex     Status: None   Collection Time: 06/08/16  6:10 PM  Result Value Ref Range Status   C Diff antigen NEGATIVE NEGATIVE Final   C Diff toxin NEGATIVE NEGATIVE Final   C Diff interpretation No C. difficile detected.  Final     Time coordinating discharge: Over 30 minutes  SIGNED:   Kathie Dike, MD  [facility] 06/14/2016, 1:47 PM Pager   If 7PM-7AM, please contact night-coverage [email or web address] Password [email or web address]

## 2016-06-14 NOTE — Progress Notes (Signed)
Patient is to be discharged home and in stable condition. Patient verbalizes understanding of medication regimen and the need for follow-up appointments. Patient will be escorted out by staff, once family arrives as transportation.  Celestia Khat, RN

## 2016-06-18 ENCOUNTER — Other Ambulatory Visit (HOSPITAL_COMMUNITY): Payer: Self-pay | Admitting: Oncology

## 2016-06-18 DIAGNOSIS — N10 Acute tubulo-interstitial nephritis: Secondary | ICD-10-CM | POA: Diagnosis not present

## 2016-06-18 DIAGNOSIS — E109 Type 1 diabetes mellitus without complications: Secondary | ICD-10-CM | POA: Diagnosis not present

## 2016-06-18 DIAGNOSIS — I10 Essential (primary) hypertension: Secondary | ICD-10-CM | POA: Diagnosis not present

## 2016-06-18 DIAGNOSIS — Z09 Encounter for follow-up examination after completed treatment for conditions other than malignant neoplasm: Secondary | ICD-10-CM | POA: Diagnosis not present

## 2016-06-18 DIAGNOSIS — E1165 Type 2 diabetes mellitus with hyperglycemia: Secondary | ICD-10-CM | POA: Diagnosis not present

## 2016-06-18 DIAGNOSIS — Z6833 Body mass index (BMI) 33.0-33.9, adult: Secondary | ICD-10-CM | POA: Diagnosis not present

## 2016-06-21 ENCOUNTER — Ambulatory Visit (HOSPITAL_COMMUNITY): Payer: Self-pay

## 2016-06-21 ENCOUNTER — Emergency Department (HOSPITAL_COMMUNITY): Payer: PPO

## 2016-06-21 ENCOUNTER — Observation Stay (HOSPITAL_COMMUNITY)
Admission: EM | Admit: 2016-06-21 | Discharge: 2016-06-23 | Disposition: A | Discharge status: Home / Self Care | Payer: PPO | Attending: Internal Medicine | Admitting: Internal Medicine

## 2016-06-21 DIAGNOSIS — E039 Hypothyroidism, unspecified: Secondary | ICD-10-CM | POA: Diagnosis not present

## 2016-06-21 DIAGNOSIS — E1122 Type 2 diabetes mellitus with diabetic chronic kidney disease: Secondary | ICD-10-CM | POA: Diagnosis not present

## 2016-06-21 DIAGNOSIS — Z79899 Other long term (current) drug therapy: Secondary | ICD-10-CM | POA: Diagnosis not present

## 2016-06-21 DIAGNOSIS — N183 Chronic kidney disease, stage 3 unspecified: Secondary | ICD-10-CM | POA: Diagnosis present

## 2016-06-21 DIAGNOSIS — R509 Fever, unspecified: Principal | ICD-10-CM | POA: Diagnosis present

## 2016-06-21 DIAGNOSIS — J449 Chronic obstructive pulmonary disease, unspecified: Secondary | ICD-10-CM | POA: Insufficient documentation

## 2016-06-21 DIAGNOSIS — Z87891 Personal history of nicotine dependence: Secondary | ICD-10-CM | POA: Diagnosis not present

## 2016-06-21 DIAGNOSIS — Z794 Long term (current) use of insulin: Secondary | ICD-10-CM | POA: Insufficient documentation

## 2016-06-21 DIAGNOSIS — M545 Low back pain: Secondary | ICD-10-CM | POA: Insufficient documentation

## 2016-06-21 DIAGNOSIS — N200 Calculus of kidney: Secondary | ICD-10-CM | POA: Diagnosis not present

## 2016-06-21 DIAGNOSIS — E1165 Type 2 diabetes mellitus with hyperglycemia: Secondary | ICD-10-CM

## 2016-06-21 DIAGNOSIS — Z853 Personal history of malignant neoplasm of breast: Secondary | ICD-10-CM | POA: Insufficient documentation

## 2016-06-21 DIAGNOSIS — C773 Secondary and unspecified malignant neoplasm of axilla and upper limb lymph nodes: Secondary | ICD-10-CM | POA: Insufficient documentation

## 2016-06-21 DIAGNOSIS — C50912 Malignant neoplasm of unspecified site of left female breast: Secondary | ICD-10-CM | POA: Diagnosis present

## 2016-06-21 LAB — URINALYSIS, ROUTINE W REFLEX MICROSCOPIC
Bilirubin Urine: NEGATIVE
Glucose, UA: 50 mg/dL — AB
Ketones, ur: NEGATIVE mg/dL
Nitrite: NEGATIVE
Protein, ur: NEGATIVE mg/dL
Specific Gravity, Urine: 1.013 (ref 1.005–1.030)
pH: 6 (ref 5.0–8.0)

## 2016-06-21 LAB — CBC WITH DIFFERENTIAL/PLATELET
Basophils Absolute: 0.1 10*3/uL (ref 0.0–0.1)
Basophils Relative: 0 %
Eosinophils Absolute: 0.2 10*3/uL (ref 0.0–0.7)
Eosinophils Relative: 1 %
HCT: 38.8 % (ref 36.0–46.0)
Hemoglobin: 12.7 g/dL (ref 12.0–15.0)
Lymphocytes Relative: 35 %
Lymphs Abs: 6.6 10*3/uL — ABNORMAL HIGH (ref 0.7–4.0)
MCH: 28.5 pg (ref 26.0–34.0)
MCHC: 32.7 g/dL (ref 30.0–36.0)
MCV: 87 fL (ref 78.0–100.0)
Monocytes Absolute: 2.1 10*3/uL — ABNORMAL HIGH (ref 0.1–1.0)
Monocytes Relative: 11 %
Neutro Abs: 10 10*3/uL — ABNORMAL HIGH (ref 1.7–7.7)
Neutrophils Relative %: 53 %
Platelets: 416 10*3/uL — ABNORMAL HIGH (ref 150–400)
RBC: 4.46 MIL/uL (ref 3.87–5.11)
RDW: 16.4 % — ABNORMAL HIGH (ref 11.5–15.5)
WBC: 18.9 10*3/uL — ABNORMAL HIGH (ref 4.0–10.5)

## 2016-06-21 LAB — COMPREHENSIVE METABOLIC PANEL
ALT: 37 U/L (ref 14–54)
AST: 38 U/L (ref 15–41)
Albumin: 3.8 g/dL (ref 3.5–5.0)
Alkaline Phosphatase: 77 U/L (ref 38–126)
Anion gap: 10 (ref 5–15)
BUN: 17 mg/dL (ref 6–20)
CO2: 23 mmol/L (ref 22–32)
Calcium: 8.8 mg/dL — ABNORMAL LOW (ref 8.9–10.3)
Chloride: 99 mmol/L — ABNORMAL LOW (ref 101–111)
Creatinine, Ser: 1.09 mg/dL — ABNORMAL HIGH (ref 0.44–1.00)
GFR calc Af Amer: 59 mL/min — ABNORMAL LOW (ref >60)
GFR calc non Af Amer: 51 mL/min — ABNORMAL LOW (ref >60)
Glucose, Bld: 273 mg/dL — ABNORMAL HIGH (ref 65–99)
Potassium: 3.8 mmol/L (ref 3.5–5.1)
Sodium: 132 mmol/L — ABNORMAL LOW (ref 135–145)
Total Bilirubin: 0.5 mg/dL (ref 0.3–1.2)
Total Protein: 7.3 g/dL (ref 6.5–8.1)

## 2016-06-21 LAB — LACTIC ACID, PLASMA: Lactic Acid, Venous: 2.6 mmol/L (ref 0.5–1.9)

## 2016-06-21 IMAGING — CT CT ABD-PELV W/ CM
2 of 4 series · 16 of 46 positions shown, 18 images · IV contrast (Isovue)
Comparison: [DATE]

CLINICAL DATA: History of breast cancer and chronic kidney disease
fever with bilateral low back pain

EXAM:
CT ABDOMEN AND PELVIS WITH CONTRAST
TECHNIQUE: Multidetector CT imaging of the abdomen and pelvis was performed
using the standard protocol following bolus administration of
intravenous contrast.
CONTRAST:  100mL [OD] IOPAMIDOL ([OD]) INJECTION 61%

[Series 2: axial st · axial · 0.96mm/px · z∈[+732,+1142]mm · 13 of 92 slices shown, 15 images]
[im 5/92  soft-tissue]
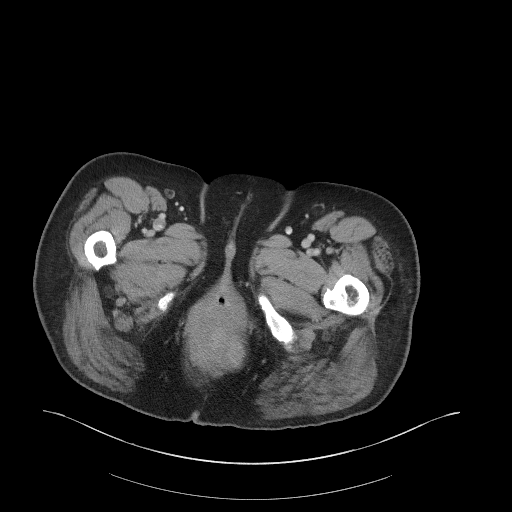
[im 5/92  bone]
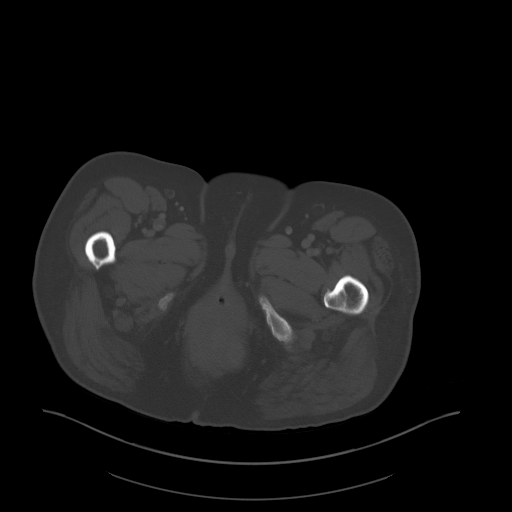
[im 14/92  soft-tissue]
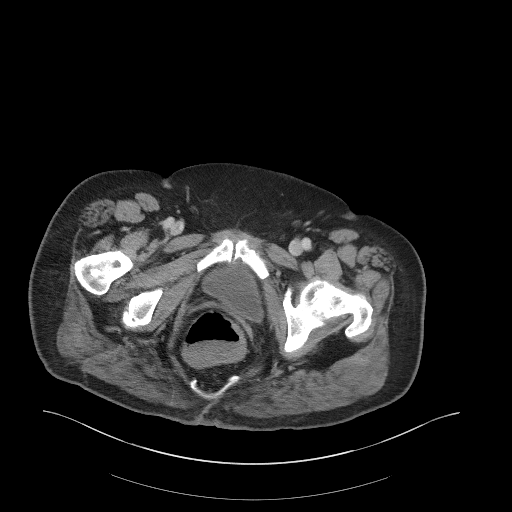
[im 18/92  soft-tissue]
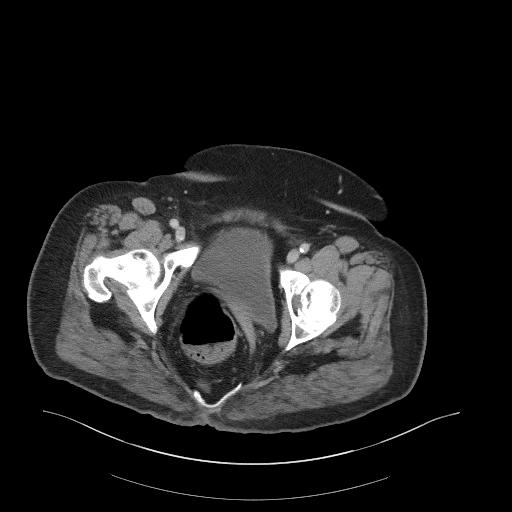
[im 27/92  soft-tissue]
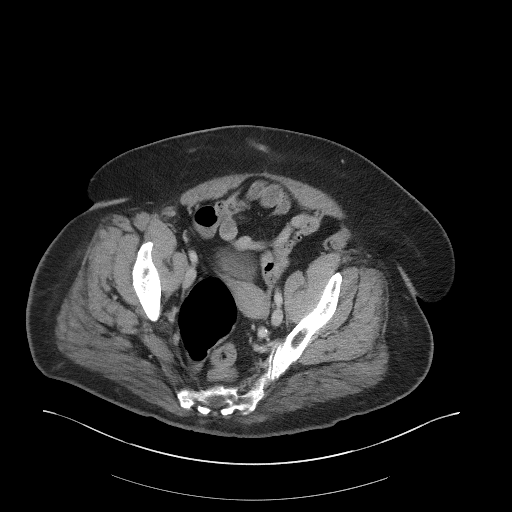
[im 31/92  soft-tissue]
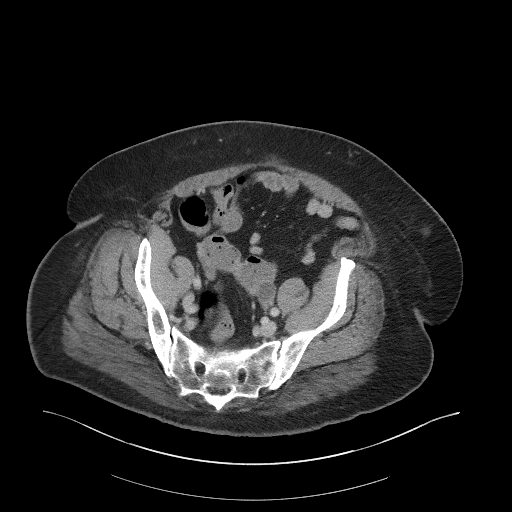
[im 40/92  soft-tissue]
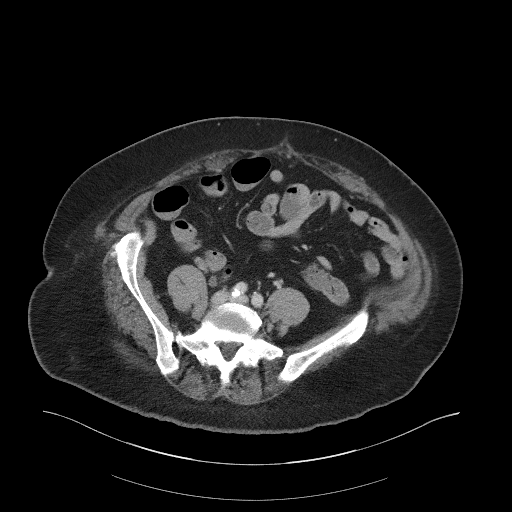
[im 48/92  soft-tissue]
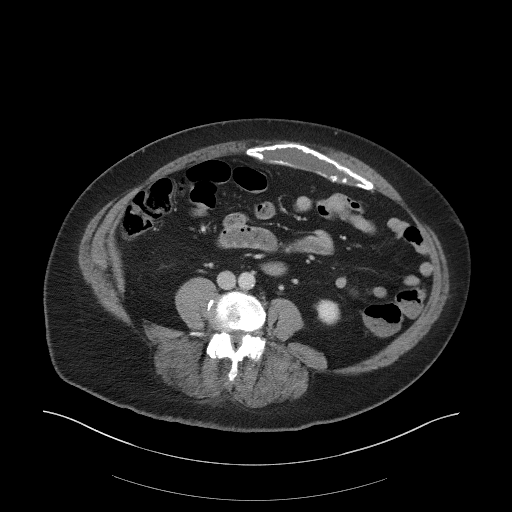
[im 53/92  soft-tissue]
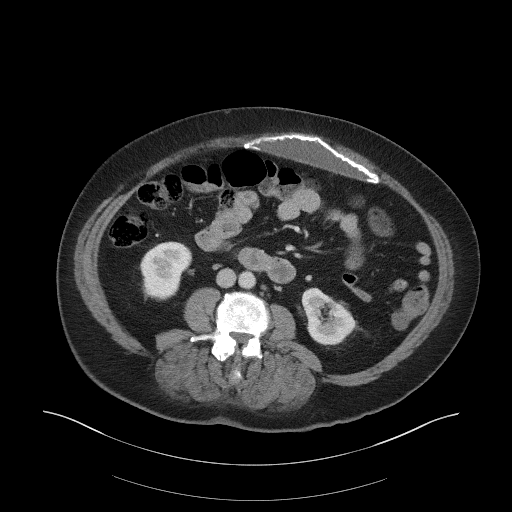
[im 61/92  soft-tissue]
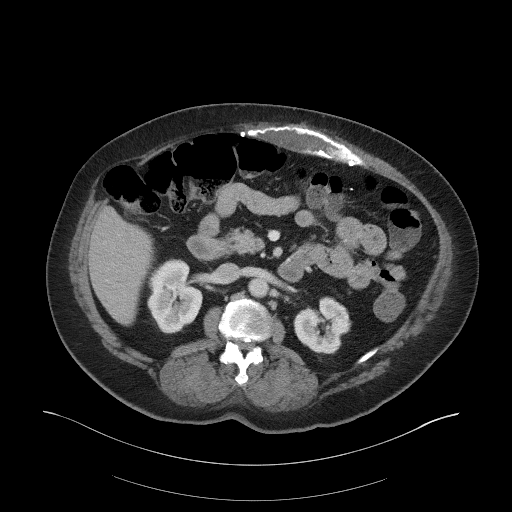
[im 61/92  bone]
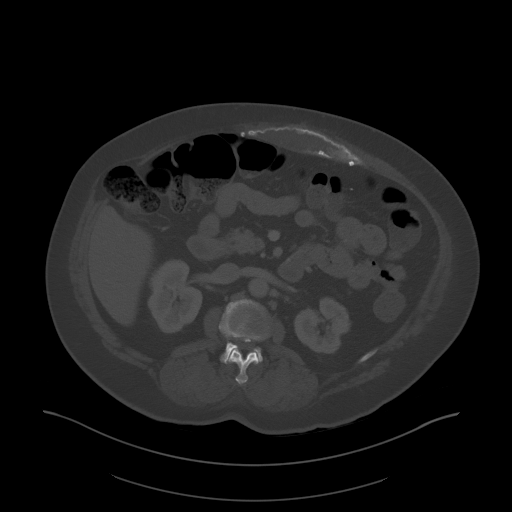
[im 66/92  soft-tissue]
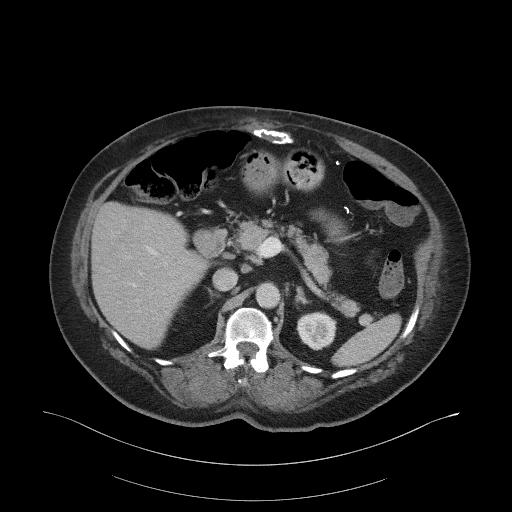
[im 74/92  soft-tissue]
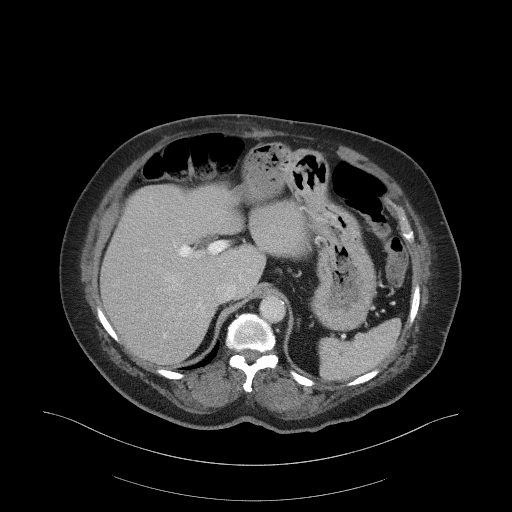
[im 79/92  soft-tissue]
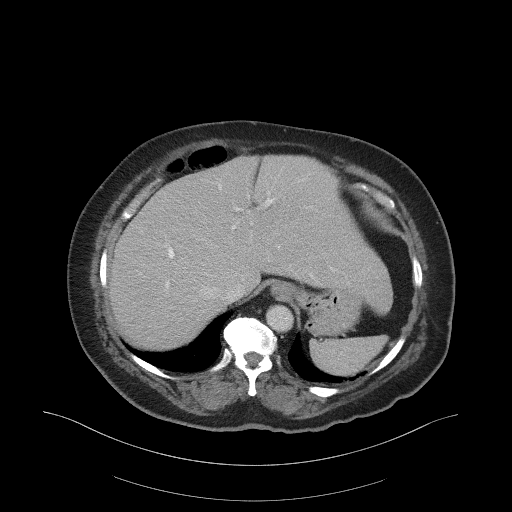
[im 87/92  soft-tissue]
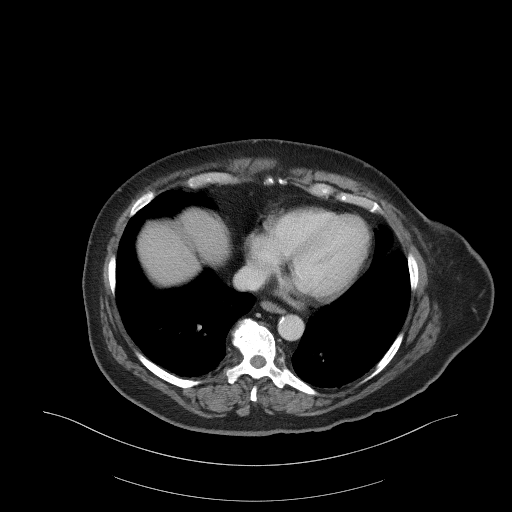

[Series 5: coronal st · coronal · 0.90mm/px · 3 of 106 slices shown]
[im 36/106  soft-tissue]
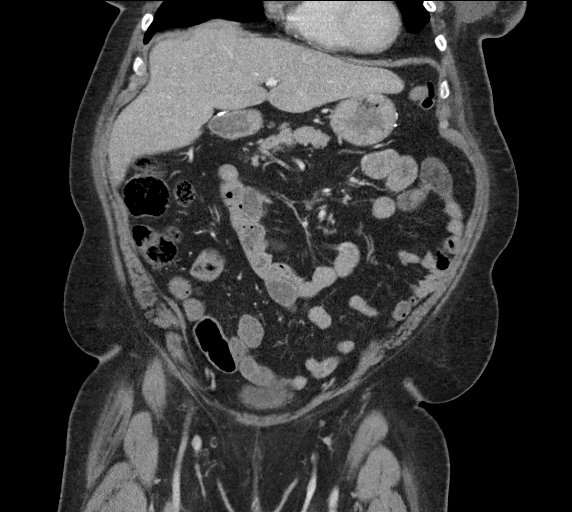
[im 47/106  soft-tissue]
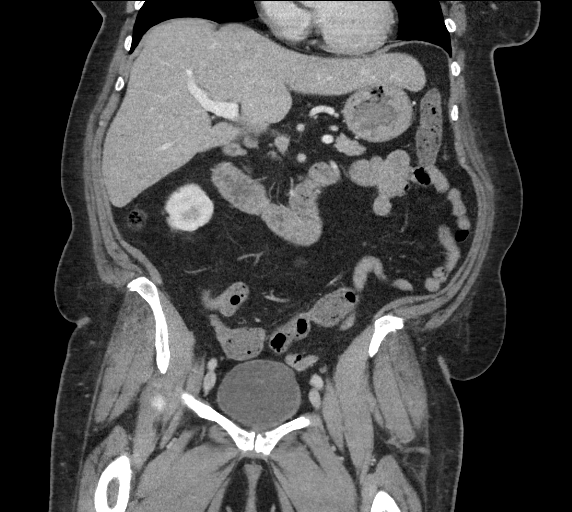
[im 59/106  soft-tissue]
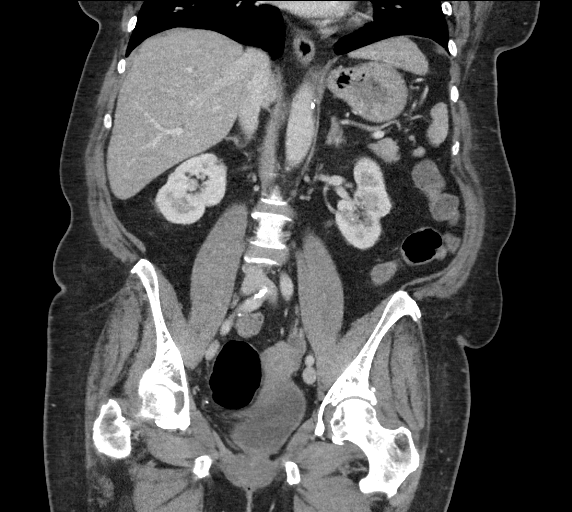

[16 of 46 positions shown; findings below may reference images not displayed]

FINDINGS: Lower chest: Lung bases demonstrate no acute consolidation or
pleural effusion.

Hepatobiliary: No focal hepatic abnormality. Hepatic steatosis.
Surgical clips in the gallbladder fossa. No biliary dilatation.

Pancreas: Unremarkable. No pancreatic ductal dilatation or
surrounding inflammatory changes.

Spleen: Normal in size without focal abnormality.

Adrenals/Urinary Tract: Right adrenal gland normal. Stable mild
nodularity of left adrenal gland. No hydronephrosis. Nonobstructing
stones in the left kidney, largest stone is seen in the midpole and
measures 4 mm. Cortical scarring of the left kidney. Subcentimeter
hypodensity in the mid to lower left kidney too small to further
characterize. The bladder is grossly unremarkable.

Stomach/Bowel: The stomach demonstrates postsurgical changes. No
evidence for a small bowel obstruction. No colon wall thickening.

Vascular/Lymphatic: Aortic atherosclerosis. Subcentimeter
peripancreatic lymph nodes are unchanged. No pelvic adenopathy

Reproductive: Stable 1.9 cm low-attenuation left adnexal lesion.

Other: No free air or free fluid. Stable peripherally calcified
lesion with fluid density in the anterior abdominal wall. Right
lateral abdominal wall hernia containing a portion of colon,
unchanged.

Musculoskeletal: Degenerative changes of the spine. No acute or
suspicious bone lesion. Absent coccyx with posterior mesh material.
Possible rectal prolapse again noted.
IMPRESSION: 1. No perinephric fluid collections or abscess is visualized.
2. Suspect mild hepatic steatosis
3. Nonobstructing stones in the left kidney
4. Stable peripherally calcified probable hernia mesh with fluid
density in the anterior abdominal wall.
5. Right lateral abdominal wall hernia containing a small amount of
colon. No obstruction.
6. Mesh material in the coccygeal region.  Possible rectal prolapse.

## 2016-06-21 IMAGING — CR DG CHEST 1V PORT
1 series · 1 of 1 positions shown · non-contrast
Comparison: [DATE]

CLINICAL DATA: Fever and low back pain

EXAM:
PORTABLE CHEST 1 VIEW

[portable]
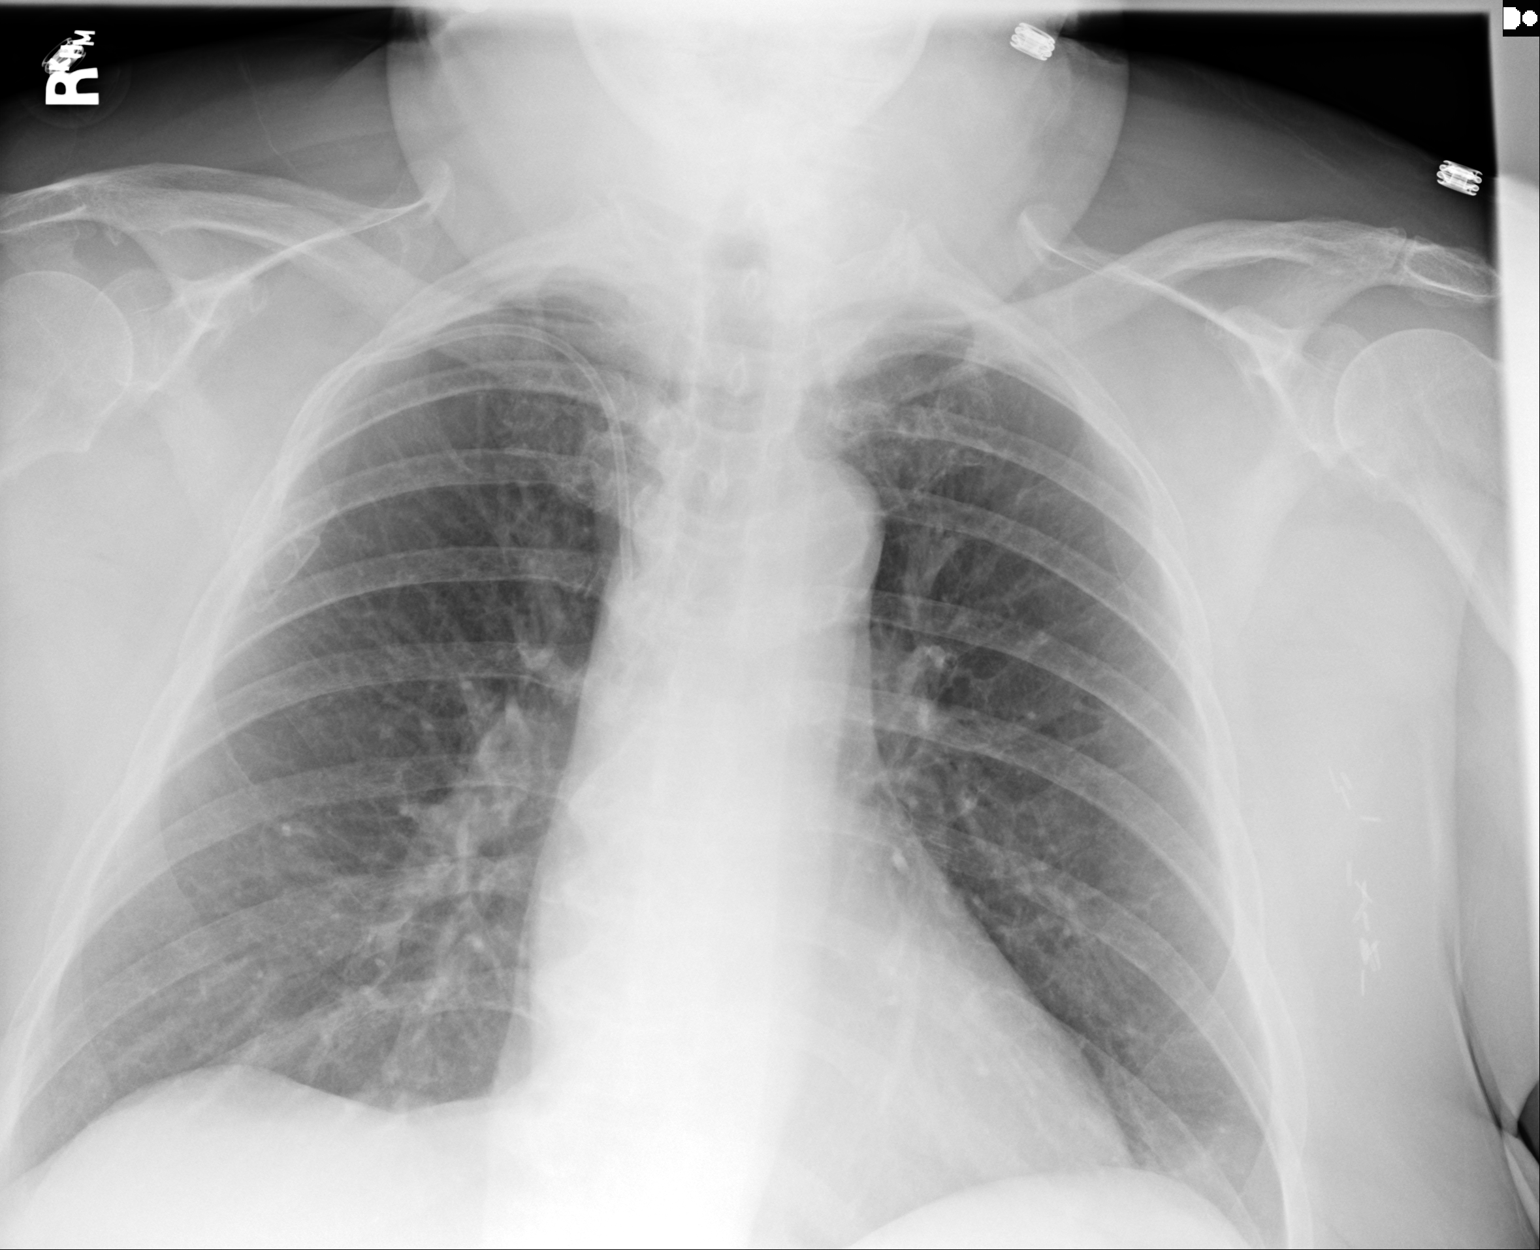

[1 of 1 positions shown; findings below may reference images not displayed]

FINDINGS: Non inclusion of the CP angles. Left axillary surgical clips. No
acute consolidation or effusion. Stable cardiomediastinal silhouette
with atherosclerosis. Right-sided central venous port tip overlies
the upper SVC.
IMPRESSION: Non inclusion of the CP angles. No radiographic evidence for acute
cardiopulmonary abnormality.

## 2016-06-21 MED ORDER — IOPAMIDOL (ISOVUE-300) INJECTION 61%
100.0000 mL | Freq: Once | INTRAVENOUS | Status: AC | PRN
  Administered 2016-06-21: 100 mL via INTRAVENOUS

## 2016-06-21 MED ORDER — SODIUM CHLORIDE 0.9 % IV BOLUS (SEPSIS)
1000.0000 mL | Freq: Once | INTRAVENOUS | Status: AC
  Administered 2016-06-21: 1000 mL via INTRAVENOUS

## 2016-06-21 MED ORDER — MORPHINE SULFATE (PF) 4 MG/ML IV SOLN
4.0000 mg | Freq: Once | INTRAVENOUS | Status: AC
  Administered 2016-06-21: 4 mg via INTRAVENOUS
  Filled 2016-06-21: qty 1

## 2016-06-21 NOTE — ED Notes (Signed)
Date and time results received: 06/21/16  2212 (use smartphrase ".now" to insert current time)  Test: lactic acid Critical Value: 2.6  Name of Provider Notified: [place]  Orders Received? Or Actions Taken?:MD notified

## 2016-06-21 NOTE — ED Triage Notes (Signed)
Pt reports having a fever and lower back pain since Saturday. Pt has taken motrin with minimal relief.

## 2016-06-21 NOTE — ED Provider Notes (Signed)
[facility] DEPT Provider Note   CSN: [id number] Arrival date & time: 06/21/16  2018 By signing my name below, I, Dyke Brackett, attest that this documentation has been prepared under the direction and in the presence of Virgel Manifold, MD . Electronically Signed: Dyke Brackett, Scribe. 06/21/2016. 9:37 PM.   History   Chief Complaint Chief Complaint  Patient presents with  . Fever-chemo card    HPI Margaret Mcdaniel is a 66 y.o. female with a history of breast cancer, COPD, CKD stage III, and DM who presents to the Emergency Department complaining of mild-moderate fever onset today. She also reports associated bilateral lower back pain onset today. Per pt, this is similar to the lower back pain she felt when she was admitted on 06/07/16 for pyelonephritis. No alleviating or modifying factors noted. Pt states she finished her Levaquin five days ago, and had been feeling better until today. She is currently being treated for breast cancer, last chemo infusion 3 weeks ago. She denies any decreased appetite, dysuria, cough, SOB, diarrhea, nausea, vomiting, or any other complaints.   The history is provided by the patient. No language interpreter was used.    Past Medical History:  Diagnosis Date  . Anxiety   . Arthritis   . Breast cancer metastasized to axillary lymph node, left ([place]) 04/14/2016  . Cancer ([place])    left breast  . COPD (chronic obstructive pulmonary disease) ([place])   . Diabetes mellitus without complication ([place])   . GERD (gastroesophageal reflux disease)   . History of kidney stones   . Hypothyroidism   . Mental disorder   . Neuromuscular disorder ([place])   . PONV (postoperative nausea and vomiting)   . Sleep apnea    Uses CPAP    Patient Active Problem List   Diagnosis Date Noted  . Pressure injury of skin 06/13/2016  . Pyelonephritis 06/08/2016  . Hypothyroidism 06/08/2016  . Sepsis ([place]) 06/08/2016  . CKD (chronic kidney disease) stage 3, GFR 30-59 ml/min  06/08/2016  . Uncontrolled type 2 diabetes mellitus with hyperglycemia, with long-term current use of insulin ([place]) 06/08/2016  . Breast cancer ([place])   . Breast cancer metastasized to axillary lymph node, left ([place]) 04/14/2016    Past Surgical History:  Procedure Laterality Date  . ANKLE SURGERY Right    fusion  . APPENDECTOMY    . CHOLECYSTECTOMY    . EXPLORATORY LAPAROTOMY    . HEMORRHOID SURGERY    . MASTECTOMY MODIFIED RADICAL Left 04/14/2016   Procedure: LEFT MODIFIED RADICAL MASTECTOMY;  Surgeon: Aviva Signs, MD;  Location: [facility];  Service: General;  Laterality: Left;  . PORTACATH PLACEMENT N/A 05/26/2016   Procedure: INSERTION PORT-A-CATH RIGHT SUBCLAVIAN AND  DRAINAGE OF SEROMA LEFT CHEST;  Surgeon: Aviva Signs, MD;  Location: [facility];  Service: General;  Laterality: N/A;  . ROTATOR CUFF REPAIR    . TONSILLECTOMY    . TUBAL LIGATION      OB History    No data available       Home Medications    Prior to Admission medications   Medication Sig Start Date End Date Taking? Authorizing Provider  allopurinol (ZYLOPRIM) 100 MG tablet Take 100 mg by mouth daily.    Historical Provider, MD  aspirin EC 81 MG tablet Take 81 mg by mouth daily.    Historical Provider, MD  chlorproMAZINE (THORAZINE) 50 MG tablet Take 100 mg by mouth at bedtime.     Historical Provider, MD  CYCLOPHOSPHAMIDE IV Inject into  the vein. Every 3 weeks    Historical Provider, MD  dexamethasone (DECADRON) 4 MG tablet Take 2 tablets (8 mg total) by mouth 2 (two) times daily. Start the day before Taxotere. Then again the day after chemo for 3 days. 05/17/16   Twana First, MD  DOCEtaxel (TAXOTERE IV) Inject into the vein. Every 3 weeks    Historical Provider, MD  HYDROcodone-acetaminophen (NORCO) 5-325 MG tablet Take 1 tablet by mouth every 6 (six) hours as needed for moderate pain. 04/16/16   Aviva Signs, MD  insulin degludec (TRESIBA FLEXTOUCH) 100 UNIT/ML SOPN FlexTouch Pen Inject 30 Units into the skin at  bedtime.    Historical Provider, MD  insulin lispro protamine-lispro (HUMALOG MIX 75/25) (75-25) 100 UNIT/ML SUSP injection Inject 25 Units into the skin 2 (two) times daily with a meal.    Historical Provider, MD  levofloxacin (LEVAQUIN) 750 MG tablet Take 1 tablet (750 mg total) by mouth daily. 06/14/16   Kathie Dike, MD  levothyroxine (SYNTHROID, LEVOTHROID) 50 MCG tablet Take 50 mcg by mouth daily before breakfast.    Historical Provider, MD  lidocaine-prilocaine (EMLA) cream Apply to affected area once Patient taking differently: Apply 1 application topically as directed. Apply to affected area once 05/17/16   Twana First, MD  LORazepam (ATIVAN) 0.5 MG tablet Take 1 tablet (0.5 mg total) by mouth every 6 (six) hours as needed (Nausea or vomiting). 05/17/16   Twana First, MD  mirtazapine (REMERON) 15 MG tablet Take 15 mg by mouth at bedtime.    Historical Provider, MD  omeprazole (PRILOSEC) 40 MG capsule Take 40 mg by mouth daily.    Historical Provider, MD  ondansetron (ZOFRAN) 8 MG tablet Take 1 tablet (8 mg total) by mouth 2 (two) times daily as needed for refractory nausea / vomiting. Start on day 3 after chemo. 05/17/16   Twana First, MD  Pegfilgrastim (NEULASTA ONPRO [place]) Inject into the skin. Every 21 days    Historical Provider, MD  polyethylene glycol powder (GLYCOLAX/MIRALAX) powder Take 17 g by mouth daily.  04/03/16   Historical Provider, MD  prochlorperazine (COMPAZINE) 10 MG tablet Take 1 tablet (10 mg total) by mouth every 6 (six) hours as needed (Nausea or vomiting). 05/17/16   Twana First, MD  simvastatin (ZOCOR) 40 MG tablet Take 40 mg by mouth at bedtime.    Historical Provider, MD  temazepam (RESTORIL) 15 MG capsule Take 15 mg by mouth at bedtime.    Historical Provider, MD  tiZANidine (ZANAFLEX) 4 MG tablet Take 8 mg by mouth 2 (two) times daily.    Historical Provider, MD    Family History No family history on file.  Social History Social History  Substance Use Topics  .  Smoking status: Former Smoker    Packs/day: 1.00    Years: 50.00    Types: Cigarettes    Quit date: 04/12/2014  . Smokeless tobacco: Never Used  . Alcohol use No     Allergies   Keflex [cephalexin]; Sulfa antibiotics; and Latex   Review of Systems Review of Systems All systems reviewed and are negative for acute change except as noted in the HPI.  Physical Exam Updated Vital Signs BP (!) 147/76 (BP Location: Right Arm)   Pulse (!) 111   Temp 98.6 F (37 C) (Oral)   Resp 20   Ht 5\' 4"  (1.626 m)   Wt 195 lb (88.5 kg)   SpO2 96%   BMI 33.47 kg/m   Physical Exam  Constitutional:  She is oriented to person, place, and time. She appears well-developed and well-nourished. No distress.  HENT:  Head: Normocephalic and atraumatic.  Eyes: EOM are normal.  Neck: Normal range of motion.  Cardiovascular: Normal rate, regular rhythm and normal heart sounds.   Pulmonary/Chest: Effort normal and breath sounds normal.  Abdominal: Soft. She exhibits no distension. There is no tenderness.  Musculoskeletal: Normal range of motion.  Mild tenderness to lower thoracic back, both midline and paraspinally   Neurological: She is alert and oriented to person, place, and time.  Skin: Skin is warm. She is diaphoretic.  Psychiatric: She has a normal mood and affect. Judgment normal.  Nursing note and vitals reviewed.  ED Treatments / Results  DIAGNOSTIC STUDIES:  Oxygen Saturation is 96% on RA, normal by my interpretation.    COORDINATION OF CARE:  9:33 PM Discussed treatment plan with pt at bedside and pt agreed to plan.   Labs (all labs ordered are listed, but only abnormal results are displayed) Labs Reviewed  URINE CULTURE - Abnormal; Notable for the following:       Result Value   Culture MULTIPLE SPECIES PRESENT, SUGGEST RECOLLECTION (*)    All other components within normal limits  URINALYSIS, ROUTINE W REFLEX MICROSCOPIC - Abnormal; Notable for the following:    Glucose, UA 50  (*)    Hgb urine dipstick SMALL (*)    Leukocytes, UA TRACE (*)    Bacteria, UA RARE (*)    Squamous Epithelial / LPF 0-5 (*)    All other components within normal limits  CBC WITH DIFFERENTIAL/PLATELET - Abnormal; Notable for the following:    WBC 18.9 (*)    RDW 16.4 (*)    Platelets 416 (*)    Neutro Abs 10.0 (*)    Lymphs Abs 6.6 (*)    Monocytes Absolute 2.1 (*)    All other components within normal limits  COMPREHENSIVE METABOLIC PANEL - Abnormal; Notable for the following:    Sodium 132 (*)    Chloride 99 (*)    Glucose, Bld 273 (*)    Creatinine, Ser 1.09 (*)    Calcium 8.8 (*)    GFR calc non Af Amer 51 (*)    GFR calc Af Amer 59 (*)    All other components within normal limits  LACTIC ACID, PLASMA - Abnormal; Notable for the following:    Lactic Acid, Venous 2.6 (*)    All other components within normal limits  LACTIC ACID, PLASMA - Abnormal; Notable for the following:    Lactic Acid, Venous 2.2 (*)    All other components within normal limits  GLUCOSE, CAPILLARY - Abnormal; Notable for the following:    Glucose-Capillary 168 (*)    All other components within normal limits  GLUCOSE, CAPILLARY - Abnormal; Notable for the following:    Glucose-Capillary 261 (*)    All other components within normal limits  GLUCOSE, CAPILLARY - Abnormal; Notable for the following:    Glucose-Capillary 188 (*)    All other components within normal limits  CBC WITH DIFFERENTIAL/PLATELET - Abnormal; Notable for the following:    WBC 11.6 (*)    Hemoglobin 11.5 (*)    HCT 35.7 (*)    RDW 16.6 (*)    Lymphs Abs 4.6 (*)    Monocytes Absolute 1.2 (*)    All other components within normal limits  GLUCOSE, CAPILLARY - Abnormal; Notable for the following:    Glucose-Capillary 215 (*)    All other  components within normal limits  GLUCOSE, CAPILLARY - Abnormal; Notable for the following:    Glucose-Capillary 247 (*)    All other components within normal limits  GLUCOSE, CAPILLARY -  Abnormal; Notable for the following:    Glucose-Capillary 354 (*)    All other components within normal limits  GLUCOSE, CAPILLARY - Abnormal; Notable for the following:    Glucose-Capillary >600 (*)    All other components within normal limits  GLUCOSE, CAPILLARY - Abnormal; Notable for the following:    Glucose-Capillary 589 (*)    All other components within normal limits  CULTURE, BLOOD (ROUTINE X 2)  CULTURE, BLOOD (ROUTINE X 2)  LACTIC ACID, PLASMA  LACTIC ACID, PLASMA  INFLUENZA PANEL BY PCR (TYPE A & B)    EKG  EKG Interpretation None       Radiology No results found.  Procedures Procedures (including critical care time)  Medications Ordered in ED Medications - No data to display   Initial Impression / Assessment and Plan / ED Course  I have reviewed the triage vital signs and the nursing notes.  Pertinent labs & imaging results that were available during my care of the patient were reviewed by me and considered in my medical decision making (see chart for details).  65yF with fever. Recent tx for pyelo. UA today not overly impressive. No clear source. Lactic acid mildly elevated. Will admit for obs.   Final Clinical Impressions(s) / ED Diagnoses   Final diagnoses:  Fever, unspecified fever cause    New Prescriptions New Prescriptions   No medications on file   I personally preformed the services scribed in my presence. The recorded information has been reviewed is accurate. Virgel Manifold, MD.    Virgel Manifold, MD 07/01/16 [id number]

## 2016-06-22 ENCOUNTER — Encounter (HOSPITAL_COMMUNITY): Payer: Self-pay

## 2016-06-22 DIAGNOSIS — N183 Chronic kidney disease, stage 3 (moderate): Secondary | ICD-10-CM | POA: Diagnosis not present

## 2016-06-22 DIAGNOSIS — E1165 Type 2 diabetes mellitus with hyperglycemia: Secondary | ICD-10-CM

## 2016-06-22 DIAGNOSIS — R509 Fever, unspecified: Secondary | ICD-10-CM | POA: Diagnosis not present

## 2016-06-22 DIAGNOSIS — C50912 Malignant neoplasm of unspecified site of left female breast: Secondary | ICD-10-CM | POA: Diagnosis not present

## 2016-06-22 DIAGNOSIS — Z794 Long term (current) use of insulin: Secondary | ICD-10-CM | POA: Diagnosis not present

## 2016-06-22 DIAGNOSIS — C773 Secondary and unspecified malignant neoplasm of axilla and upper limb lymph nodes: Secondary | ICD-10-CM | POA: Diagnosis not present

## 2016-06-22 LAB — GLUCOSE, CAPILLARY
GLUCOSE-CAPILLARY: 188 mg/dL — AB (ref 65–99)
GLUCOSE-CAPILLARY: 215 mg/dL — AB (ref 65–99)
Glucose-Capillary: 168 mg/dL — ABNORMAL HIGH (ref 65–99)
Glucose-Capillary: 261 mg/dL — ABNORMAL HIGH (ref 65–99)

## 2016-06-22 LAB — INFLUENZA PANEL BY PCR (TYPE A & B)
INFLAPCR: NEGATIVE
Influenza B By PCR: NEGATIVE

## 2016-06-22 LAB — LACTIC ACID, PLASMA
LACTIC ACID, VENOUS: 1.6 mmol/L (ref 0.5–1.9)
Lactic Acid, Venous: 2.2 mmol/L (ref 0.5–1.9)
Lactic Acid, Venous: 1.7 mmol/L (ref 0.5–1.9)

## 2016-06-22 MED ORDER — ONDANSETRON HCL 4 MG PO TABS: 8.0000 mg | ORAL_TABLET | Freq: Two times a day (BID) | ORAL | Status: DC | PRN

## 2016-06-22 MED ORDER — CHLORPROMAZINE HCL 100 MG PO TABS
100.0000 mg | ORAL_TABLET | Freq: Every day | ORAL | Status: DC
  Administered 2016-06-22: 100 mg via ORAL
  Filled 2016-06-22: qty 1

## 2016-06-22 MED ORDER — HYDROCODONE-ACETAMINOPHEN 5-325 MG PO TABS
1.0000 | ORAL_TABLET | ORAL | Status: DC | PRN
  Administered 2016-06-22 – 2016-06-23 (×2): 1 via ORAL
  Filled 2016-06-22 (×2): qty 1

## 2016-06-22 MED ORDER — PROCHLORPERAZINE MALEATE 5 MG PO TABS: 10.0000 mg | ORAL_TABLET | Freq: Four times a day (QID) | ORAL | Status: DC | PRN

## 2016-06-22 MED ORDER — SODIUM CHLORIDE 0.9% FLUSH
3.0000 mL | Freq: Two times a day (BID) | INTRAVENOUS | Status: DC
  Administered 2016-06-22 – 2016-06-23 (×4): 3 mL via INTRAVENOUS

## 2016-06-22 MED ORDER — TEMAZEPAM 15 MG PO CAPS
15.0000 mg | ORAL_CAPSULE | Freq: Every day | ORAL | Status: DC
  Administered 2016-06-22: 15 mg via ORAL
  Filled 2016-06-22: qty 1

## 2016-06-22 MED ORDER — INSULIN ASPART 100 UNIT/ML ~~LOC~~ SOLN
0.0000 [IU] | Freq: Three times a day (TID) | SUBCUTANEOUS | Status: DC
  Administered 2016-06-22: 5 [IU] via SUBCUTANEOUS
  Administered 2016-06-22 (×2): 2 [IU] via SUBCUTANEOUS
  Administered 2016-06-23: 3 [IU] via SUBCUTANEOUS
  Administered 2016-06-23: 9 [IU] via SUBCUTANEOUS

## 2016-06-22 MED ORDER — TIZANIDINE HCL 4 MG PO TABS
8.0000 mg | ORAL_TABLET | Freq: Two times a day (BID) | ORAL | Status: DC
  Administered 2016-06-22 – 2016-06-23 (×3): 8 mg via ORAL
  Filled 2016-06-22 (×3): qty 2

## 2016-06-22 MED ORDER — SODIUM CHLORIDE 0.9 % IV BOLUS (SEPSIS)
1000.0000 mL | Freq: Once | INTRAVENOUS | Status: AC
  Administered 2016-06-22: 1000 mL via INTRAVENOUS

## 2016-06-22 MED ORDER — ALLOPURINOL 100 MG PO TABS
100.0000 mg | ORAL_TABLET | Freq: Every day | ORAL | Status: DC
  Administered 2016-06-22 – 2016-06-23 (×2): 100 mg via ORAL
  Filled 2016-06-22 (×2): qty 1

## 2016-06-22 MED ORDER — SODIUM CHLORIDE 0.9 % IV SOLN: 250.0000 mL | INTRAVENOUS | Status: DC | PRN

## 2016-06-22 MED ORDER — SODIUM CHLORIDE 0.9% FLUSH: 3.0000 mL | INTRAVENOUS | Status: DC | PRN

## 2016-06-22 MED ORDER — MIRTAZAPINE 15 MG PO TABS
15.0000 mg | ORAL_TABLET | Freq: Every day | ORAL | Status: DC
  Administered 2016-06-22: 15 mg via ORAL
  Filled 2016-06-22: qty 1

## 2016-06-22 MED ORDER — HYDROCODONE-ACETAMINOPHEN 5-325 MG PO TABS
1.0000 | ORAL_TABLET | Freq: Four times a day (QID) | ORAL | Status: DC | PRN
  Administered 2016-06-22 (×3): 1 via ORAL
  Filled 2016-06-22 (×3): qty 1

## 2016-06-22 MED ORDER — LORAZEPAM 0.5 MG PO TABS: 0.5000 mg | ORAL_TABLET | Freq: Four times a day (QID) | ORAL | Status: DC | PRN

## 2016-06-22 NOTE — Progress Notes (Signed)
PROGRESS NOTE    Margaret Mcdaniel  NWG:[id number] DOB: [birth year] DOA: 06/21/2016 PCP: Wende Neighbors, MD    Brief Narrative:  66 yo female with hx of breast CA on chemotherapy admitted for fever and leukocytosis of unclear source.  She had pyelo on previous admission, but is having no GU symptom this admission.  Her UA is clear, CXR is negative, and BC and urine culture are negative thus far.  She was not started on any antibiotics.  Her Flu tests are negative.  Hemodynamically stable.  Lactic acid normalized.    Assessment & Plan:   Principal Problem:   Fever Active Problems:   Breast cancer metastasized to axillary lymph node, left ([facility])   CKD (chronic kidney disease) stage 3, GFR 30-59 ml/min   Uncontrolled type 2 diabetes mellitus with hyperglycemia, with long-term current use of insulin ([place])   1. Fever and leukocytosis:  No clear source of infection.  Continue with no antibiotics and check cultures.  Leukocytosis may be due to Decadron given with chemo last week.  2.   CKD:  Stable.  Will continue to follow. 3.   DM:  SSI.     DVT prophylaxis: SCD. Code Status: FULL CODE.  Family Communication: None.  Disposition Plan: home.  Consultants: None.   Procedures:   None.   Antimicrobials: Anti-infectives    None       Subjective:   Not feeling well.  Objective: Vitals:   06/22/16 0100 06/22/16 0143 06/22/16 0512 06/22/16 1300  BP: 93/63 118/67 101/60 (!) 156/79  Pulse: 76 78 67 80  Resp:  20 20 20   Temp:  98.3 F (36.8 C) 98 F (36.7 C) 99.1 F (37.3 C)  TempSrc:  Oral Oral Oral  SpO2: 95% 95% 97% 98%  Weight:  88.5 kg (195 lb 1.7 oz)    Height:  5\' 4"  (1.626 m)      Intake/Output Summary (Last 24 hours) at 06/22/16 1503 Last data filed at 06/22/16 1320  Gross per 24 hour  Intake             1600 ml  Output              900 ml  Net              700 ml   Filed Weights   06/21/16 2029 06/22/16 0143  Weight: 88.5 kg (195 lb) 88.5 kg (195 lb 1.7 oz)     Examination:  General exam: Appears calm and comfortable  Respiratory system: Clear to auscultation. Respiratory effort normal. Cardiovascular system: S1 & S2 heard, RRR. No JVD, murmurs, rubs, gallops or clicks. No pedal edema. Gastrointestinal system: Abdomen is nondistended, soft and nontender. No organomegaly or masses felt. Normal bowel sounds heard. Central nervous system: Alert and oriented. No focal neurological deficits. Extremities: Symmetric 5 x 5 power. Skin: No rashes, lesions or ulcers Psychiatry: Judgement and insight appear normal. Mood & affect appropriate.   Data Reviewed: I have personally reviewed following labs and imaging studies  CBC:  Recent Labs Lab 06/21/16 2129  WBC 18.9*  NEUTROABS 10.0*  HGB 12.7  HCT 38.8  MCV 87.0  PLT 578*   Basic Metabolic Panel:  Recent Labs Lab 06/21/16 2129  NA 132*  K 3.8  CL 99*  CO2 23  GLUCOSE 273*  BUN 17  CREATININE 1.09*  CALCIUM 8.8*   GFR: Estimated Creatinine Clearance: 53.9 mL/min (A) (by C-G formula based on SCr of 1.09 mg/dL (H)). Liver  Function Tests:  Recent Labs Lab 06/21/16 2129  AST 38  ALT 37  ALKPHOS 77  BILITOT 0.5  PROT 7.3  ALBUMIN 3.8   CBG:  Recent Labs Lab 06/22/16 0759 06/22/16 1126  GLUCAP 168* 261*   Lipid Profile: Sepsis Labs:  Recent Labs Lab 06/21/16 2129 06/22/16 0033 06/22/16 0202 06/22/16 0459  LATICACIDVEN 2.6* 2.2* 1.7 1.6    Recent Results (from the past 240 hour(s))  Blood culture (routine x 2)     Status: None (Preliminary result)   Collection Time: 06/21/16  9:29 PM  Result Value Ref Range Status   Specimen Description SITE NOT SPECIFIED  Final   Special Requests Blood Culture adequate volume  Final   Culture NO GROWTH < 24 HOURS  Final   Report Status PENDING  Incomplete  Blood culture (routine x 2)     Status: None (Preliminary result)   Collection Time: 06/21/16  9:52 PM  Result Value Ref Range Status   Specimen Description RIGHT  ANTECUBITAL  Final   Special Requests Blood Culture adequate volume  Final   Culture NO GROWTH < 12 HOURS  Final   Report Status PENDING  Incomplete     Radiology Studies: Ct Abdomen Pelvis W Contrast  Result Date: 06/22/2016 CLINICAL DATA:  History of breast cancer and chronic kidney disease fever with bilateral low back pain EXAM: CT ABDOMEN AND PELVIS WITH CONTRAST TECHNIQUE: Multidetector CT imaging of the abdomen and pelvis was performed using the standard protocol following bolus administration of intravenous contrast. CONTRAST:  184mL ISOVUE-300 IOPAMIDOL (ISOVUE-300) INJECTION 61% COMPARISON:  06/08/2016 FINDINGS: Lower chest: Lung bases demonstrate no acute consolidation or pleural effusion. Hepatobiliary: No focal hepatic abnormality. Hepatic steatosis. Surgical clips in the gallbladder fossa. No biliary dilatation. Pancreas: Unremarkable. No pancreatic ductal dilatation or surrounding inflammatory changes. Spleen: Normal in size without focal abnormality. Adrenals/Urinary Tract: Right adrenal gland normal. Stable mild nodularity of left adrenal gland. No hydronephrosis. Nonobstructing stones in the left kidney, largest stone is seen in the midpole and measures 4 mm. Cortical scarring of the left kidney. Subcentimeter hypodensity in the mid to lower left kidney too small to further characterize. The bladder is grossly unremarkable. Stomach/Bowel: The stomach demonstrates postsurgical changes. No evidence for a small bowel obstruction. No colon wall thickening. Vascular/Lymphatic: Aortic atherosclerosis. Subcentimeter peripancreatic lymph nodes are unchanged. No pelvic adenopathy Reproductive: Stable 1.9 cm low-attenuation left adnexal lesion. Other: No free air or free fluid. Stable peripherally calcified lesion with fluid density in the anterior abdominal wall. Right lateral abdominal wall hernia containing a portion of colon, unchanged. Musculoskeletal: Degenerative changes of the spine. No  acute or suspicious bone lesion. Absent coccyx with posterior mesh material. Possible rectal prolapse again noted. IMPRESSION: 1. No perinephric fluid collections or abscess is visualized. 2. Suspect mild hepatic steatosis 3. Nonobstructing stones in the left kidney 4. Stable peripherally calcified probable hernia mesh with fluid density in the anterior abdominal wall. 5. Right lateral abdominal wall hernia containing a small amount of colon. No obstruction. 6. Mesh material in the coccygeal region.  Possible rectal prolapse. Electronically Signed   By: Donavan Foil M.D.   On: 06/22/2016 00:20   Dg Chest Portable 1 View  Result Date: 06/21/2016 CLINICAL DATA:  Fever and low back pain EXAM: PORTABLE CHEST 1 VIEW COMPARISON:  06/07/2016 FINDINGS: Non inclusion of the CP angles. Left axillary surgical clips. No acute consolidation or effusion. Stable cardiomediastinal silhouette with atherosclerosis. Right-sided central venous port tip overlies the upper SVC.  IMPRESSION: Non inclusion of the CP angles. No radiographic evidence for acute cardiopulmonary abnormality. Electronically Signed   By: Donavan Foil M.D.   On: 06/21/2016 22:11    Scheduled Meds: . allopurinol  100 mg Oral Daily  . chlorproMAZINE  100 mg Oral QHS  . insulin aspart  0-9 Units Subcutaneous TID WC  . mirtazapine  15 mg Oral QHS  . sodium chloride flush  3 mL Intravenous Q12H  . temazepam  15 mg Oral QHS  . tiZANidine  8 mg Oral BID   Continuous Infusions: . sodium chloride       LOS: 0 days   Leia Coletti, MD FACP Hospitalist.   If 7PM-7AM, please contact night-coverage [email or web address] Password [facility] 06/22/2016, 3:03 PM

## 2016-06-22 NOTE — Care Management Obs Status (Signed)
[place] NOTIFICATION   Patient Details  Name: VANEZA PICKART MRN: [id number] Date of Birth: [birth year]   Medicare Observation Status Notification Given:  Yes    Sherald Barge, RN 06/22/2016, 1:07 PM

## 2016-06-22 NOTE — H&P (Signed)
History and Physical    Margaret Mcdaniel JSH:[id number] DOB: 06/02/1950 DOA: 06/21/2016  PCP: Wende Neighbors, MD  Patient coming from:  home  Chief Complaint:  fever  HPI: Margaret Mcdaniel is a 66 y.o. female with medical history significant of  Breast cancer on chemo d/c last week with pyelo completed course of levaquin 6 days ago was doing well when she reports a fever again earlier today a peak of 100.6.  ED reported it was over 102 at home but she denies this.  She says she has some lower back pain, but no flank pain.  No urinary symptoms at all, no dysuria or hematuria.  No n/v/d.  No URI, no cough.  No sob.  No rash.  Pt has mildly elevated lactate around 2.6 and neg ua and cxr.  Referred for admission for fever in chemo patient.  Ct shows complications from recent pyelonephritis and urine cx from last week showed multiorganisms suggested reculturing.     Review of Systems: As per HPI otherwise 10 point review of systems negative.   Past Medical History:  Diagnosis Date  . Anxiety   . Arthritis   . Breast cancer metastasized to axillary lymph node, left ([place]) 04/14/2016  . Cancer ([place])    left breast  . COPD (chronic obstructive pulmonary disease) ([place])   . Diabetes mellitus without complication ([place])   . GERD (gastroesophageal reflux disease)   . History of kidney stones   . Hypothyroidism   . Mental disorder   . Neuromuscular disorder ([place])   . PONV (postoperative nausea and vomiting)   . Sleep apnea    Uses CPAP    Past Surgical History:  Procedure Laterality Date  . ANKLE SURGERY Right    fusion  . APPENDECTOMY    . CHOLECYSTECTOMY    . EXPLORATORY LAPAROTOMY    . HEMORRHOID SURGERY    . MASTECTOMY MODIFIED RADICAL Left 04/14/2016   Procedure: LEFT MODIFIED RADICAL MASTECTOMY;  Surgeon: Aviva Signs, MD;  Location: [facility];  Service: General;  Laterality: Left;  . PORTACATH PLACEMENT N/A 05/26/2016   Procedure: INSERTION PORT-A-CATH RIGHT SUBCLAVIAN AND  DRAINAGE OF SEROMA  LEFT CHEST;  Surgeon: Aviva Signs, MD;  Location: [facility];  Service: General;  Laterality: N/A;  . ROTATOR CUFF REPAIR    . TONSILLECTOMY    . TUBAL LIGATION       reports that she quit smoking about 2 years ago. Her smoking use included Cigarettes. She has a 50.00 pack-year smoking history. She has never used smokeless tobacco. She reports that she does not drink alcohol or use drugs.  Allergies  Allergen Reactions  . Keflex [Cephalexin] Anaphylaxis    Pt "codes" on this medication.  . Sulfa Antibiotics Hives  . Latex Rash    History reviewed. No pertinent family history.  Prior to Admission medications   Medication Sig Start Date End Date Taking? Authorizing Provider  allopurinol (ZYLOPRIM) 100 MG tablet Take 100 mg by mouth daily.   Yes Historical Provider, MD  aspirin EC 81 MG tablet Take 81 mg by mouth daily.   Yes Historical Provider, MD  chlorproMAZINE (THORAZINE) 50 MG tablet Take 100 mg by mouth at bedtime.    Yes Historical Provider, MD  CYCLOPHOSPHAMIDE IV Inject into the vein. Every 3 weeks   Yes Historical Provider, MD  dexamethasone (DECADRON) 4 MG tablet Take 2 tablets (8 mg total) by mouth 2 (two) times daily. Start the day before Taxotere. Then again the day  after chemo for 3 days. 05/17/16  Yes Twana First, MD  DOCEtaxel (TAXOTERE IV) Inject into the vein. Every 3 weeks   Yes Historical Provider, MD  HYDROcodone-acetaminophen (NORCO) 5-325 MG tablet Take 1 tablet by mouth every 6 (six) hours as needed for moderate pain. 04/16/16  Yes Aviva Signs, MD  insulin aspart protamine - aspart (NOVOLOG MIX 70/30 FLEXPEN) (70-30) 100 UNIT/ML FlexPen Inject 30 Units into the skin 2 (two) times daily.   Yes Historical Provider, MD  insulin degludec (TRESIBA FLEXTOUCH) 100 UNIT/ML SOPN FlexTouch Pen Inject 36 Units into the skin at bedtime.    Yes Historical Provider, MD  levothyroxine (SYNTHROID, LEVOTHROID) 50 MCG tablet Take 50 mcg by mouth daily before breakfast.   Yes Historical  Provider, MD  lidocaine-prilocaine (EMLA) cream Apply to affected area once Patient taking differently: Apply 1 application topically as directed. Apply to affected area once 05/17/16  Yes Twana First, MD  LORazepam (ATIVAN) 0.5 MG tablet Take 1 tablet (0.5 mg total) by mouth every 6 (six) hours as needed (Nausea or vomiting). 05/17/16  Yes Twana First, MD  mirtazapine (REMERON) 15 MG tablet Take 15 mg by mouth at bedtime.   Yes Historical Provider, MD  omeprazole (PRILOSEC) 40 MG capsule Take 40 mg by mouth daily.   Yes Historical Provider, MD  ondansetron (ZOFRAN) 8 MG tablet Take 1 tablet (8 mg total) by mouth 2 (two) times daily as needed for refractory nausea / vomiting. Start on day 3 after chemo. 05/17/16  Yes Twana First, MD  Pegfilgrastim (NEULASTA ONPRO ) Inject into the skin. Every 21 days   Yes Historical Provider, MD  polyethylene glycol powder (GLYCOLAX/MIRALAX) powder Take 17 g by mouth daily.  04/03/16  Yes Historical Provider, MD  prochlorperazine (COMPAZINE) 10 MG tablet Take 1 tablet (10 mg total) by mouth every 6 (six) hours as needed (Nausea or vomiting). 05/17/16  Yes Twana First, MD  simvastatin (ZOCOR) 40 MG tablet Take 40 mg by mouth at bedtime.   Yes Historical Provider, MD  temazepam (RESTORIL) 15 MG capsule Take 15 mg by mouth at bedtime.   Yes Historical Provider, MD  tiZANidine (ZANAFLEX) 4 MG tablet Take 8 mg by mouth 2 (two) times daily.   Yes Historical Provider, MD  levofloxacin (LEVAQUIN) 750 MG tablet Take 1 tablet (750 mg total) by mouth daily. Patient not taking: Reported on 06/21/2016 06/14/16   Kathie Dike, MD    Physical Exam: Vitals:   06/21/16 2300 06/22/16 0006 06/22/16 0100 06/22/16 0143  BP: 106/70 117/68 93/63 118/67  Pulse: 91 80 76 78  Resp:    20  Temp:    98.3 F (36.8 C)  TempSrc:    Oral  SpO2: 94% 95% 95% 95%  Weight:    88.5 kg (195 lb 1.7 oz)  Height:    5\' 4"  (1.626 m)    Constitutional: NAD, calm, comfortable Vitals:   06/21/16  2300 06/22/16 0006 06/22/16 0100 06/22/16 0143  BP: 106/70 117/68 93/63 118/67  Pulse: 91 80 76 78  Resp:    20  Temp:    98.3 F (36.8 C)  TempSrc:    Oral  SpO2: 94% 95% 95% 95%  Weight:    88.5 kg (195 lb 1.7 oz)  Height:    5\' 4"  (1.626 m)   Eyes: PERRL, lids and conjunctivae normal ENMT: Mucous membranes are moist. Posterior pharynx clear of any exudate or lesions.Normal dentition.  Neck: normal, supple, no masses, no thyromegaly Respiratory: clear  to auscultation bilaterally, no wheezing, no crackles. Normal respiratory effort. No accessory muscle use.  Cardiovascular: Regular rate and rhythm, no murmurs / rubs / gallops. No extremity edema. 2+ pedal pulses. No carotid bruits.  Abdomen: no tenderness, no masses palpated. No hepatosplenomegaly. Bowel sounds positive.  Musculoskeletal: no clubbing / cyanosis. No joint deformity upper and lower extremities. Good ROM, no contractures. Normal muscle tone.  Skin: no rashes, lesions, ulcers. No induration Neurologic: CN 2-12 grossly intact. Sensation intact, DTR normal. Strength 5/5 in all 4.  Psychiatric: Normal judgment and insight. Alert and oriented x 3. Normal mood.    Labs on Admission: I have personally reviewed following labs and imaging studies  CBC:  Recent Labs Lab 06/21/16 2129  WBC 18.9*  NEUTROABS 10.0*  HGB 12.7  HCT 38.8  MCV 87.0  PLT 211*   Basic Metabolic Panel:  Recent Labs Lab 06/21/16 2129  NA 132*  K 3.8  CL 99*  CO2 23  GLUCOSE 273*  BUN 17  CREATININE 1.09*  CALCIUM 8.8*   GFR: Estimated Creatinine Clearance: 53.9 mL/min (A) (by C-G formula based on SCr of 1.09 mg/dL (H)). Liver Function Tests:  Recent Labs Lab 06/21/16 2129  AST 38  ALT 37  ALKPHOS 77  BILITOT 0.5  PROT 7.3  ALBUMIN 3.8   Urine analysis:    Component Value Date/Time   COLORURINE YELLOW 06/21/2016 2035   APPEARANCEUR CLEAR 06/21/2016 2035   LABSPEC 1.013 06/21/2016 2035   PHURINE 6.0 06/21/2016 2035    GLUCOSEU 50 (A) 06/21/2016 2035   HGBUR SMALL (A) 06/21/2016 2035   BILIRUBINUR NEGATIVE 06/21/2016 2035   KETONESUR NEGATIVE 06/21/2016 2035   PROTEINUR NEGATIVE 06/21/2016 2035   UROBILINOGEN 0.2 08/02/2012 1250   NITRITE NEGATIVE 06/21/2016 2035   LEUKOCYTESUR TRACE (A) 06/21/2016 2035    Recent Results (from the past 240 hour(s))  Blood culture (routine x 2)     Status: None (Preliminary result)   Collection Time: 06/21/16  9:29 PM  Result Value Ref Range Status   Specimen Description SITE NOT SPECIFIED  Final   Special Requests Blood Culture adequate volume  Final   Culture PENDING  Incomplete   Report Status PENDING  Incomplete  Blood culture (routine x 2)     Status: None (Preliminary result)   Collection Time: 06/21/16  9:52 PM  Result Value Ref Range Status   Specimen Description RIGHT ANTECUBITAL  Final   Special Requests Blood Culture adequate volume  Final   Culture PENDING  Incomplete   Report Status PENDING  Incomplete     Radiological Exams on Admission: Ct Abdomen Pelvis W Contrast  Result Date: 06/22/2016 CLINICAL DATA:  History of breast cancer and chronic kidney disease fever with bilateral low back pain EXAM: CT ABDOMEN AND PELVIS WITH CONTRAST TECHNIQUE: Multidetector CT imaging of the abdomen and pelvis was performed using the standard protocol following bolus administration of intravenous contrast. CONTRAST:  115mL ISOVUE-300 IOPAMIDOL (ISOVUE-300) INJECTION 61% COMPARISON:  06/08/2016 FINDINGS: Lower chest: Lung bases demonstrate no acute consolidation or pleural effusion. Hepatobiliary: No focal hepatic abnormality. Hepatic steatosis. Surgical clips in the gallbladder fossa. No biliary dilatation. Pancreas: Unremarkable. No pancreatic ductal dilatation or surrounding inflammatory changes. Spleen: Normal in size without focal abnormality. Adrenals/Urinary Tract: Right adrenal gland normal. Stable mild nodularity of left adrenal gland. No hydronephrosis.  Nonobstructing stones in the left kidney, largest stone is seen in the midpole and measures 4 mm. Cortical scarring of the left kidney. Subcentimeter hypodensity in the  mid to lower left kidney too small to further characterize. The bladder is grossly unremarkable. Stomach/Bowel: The stomach demonstrates postsurgical changes. No evidence for a small bowel obstruction. No colon wall thickening. Vascular/Lymphatic: Aortic atherosclerosis. Subcentimeter peripancreatic lymph nodes are unchanged. No pelvic adenopathy Reproductive: Stable 1.9 cm low-attenuation left adnexal lesion. Other: No free air or free fluid. Stable peripherally calcified lesion with fluid density in the anterior abdominal wall. Right lateral abdominal wall hernia containing a portion of colon, unchanged. Musculoskeletal: Degenerative changes of the spine. No acute or suspicious bone lesion. Absent coccyx with posterior mesh material. Possible rectal prolapse again noted. IMPRESSION: 1. No perinephric fluid collections or abscess is visualized. 2. Suspect mild hepatic steatosis 3. Nonobstructing stones in the left kidney 4. Stable peripherally calcified probable hernia mesh with fluid density in the anterior abdominal wall. 5. Right lateral abdominal wall hernia containing a small amount of colon. No obstruction. 6. Mesh material in the coccygeal region.  Possible rectal prolapse. Electronically Signed   By: Donavan Foil M.D.   On: 06/22/2016 00:20   Dg Chest Portable 1 View  Result Date: 06/21/2016 CLINICAL DATA:  Fever and low back pain EXAM: PORTABLE CHEST 1 VIEW COMPARISON:  06/07/2016 FINDINGS: Non inclusion of the CP angles. Left axillary surgical clips. No acute consolidation or effusion. Stable cardiomediastinal silhouette with atherosclerosis. Right-sided central venous port tip overlies the upper SVC. IMPRESSION: Non inclusion of the CP angles. No radiographic evidence for acute cardiopulmonary abnormality. Electronically Signed   By:  Donavan Foil M.D.   On: 06/21/2016 22:11    Assessment/Plan 66 yo female chemo status (last 3 weeks ago ) breast cancer comes in with fever with recent hospitalization for pyelonephritis  Principal Problem:   Fever-  obs on medical bed.  Hold off on abx at this time.  Check flu.  Monitor for fever here.  cx data pending.  Lactate mildly elevated, given one liter ivf in ED, will give another liter and serial q 3 hours until resolution.  Active Problems:   Breast cancer metastasized to axillary lymph node, left ([facility])- noted   CKD (chronic kidney disease) stage 3, GFR 30-59 ml/min- stable at baseline   Uncontrolled type 2 diabetes mellitus with hyperglycemia, with long-term current use of insulin ([place])- SSI     DVT prophylaxis: scds  Code Status:  full Family Communication:  none Disposition Plan:  Per day team Consults called:  none Admission status:  observation   Pamlea Finder A MD [facility]  If 7PM-7AM, please contact night-coverage [email or web address] Password [email or web address]  06/22/2016, 1:53 AM

## 2016-06-22 NOTE — Care Management Note (Signed)
Case Management Note  Patient Details  Name: Margaret Mcdaniel MRN: [id number] Date of Birth: 1950-12-11  Subjective/Objective:                  Pt with recurrent fever after completing course of abx for pyelonephritis. Pt is from home, she lives alone and has son and DIL who are very supportive. Pt has walker and BSC. Per DIL no HH or DME needed at this time. No needs communicated.  Action/Plan: Anticipate DC home in next 24 hrs.   Expected Discharge Date:    06/23/2016              Expected Discharge Plan:  Home/Self Care  In-House Referral:  NA  Discharge planning Services  CM Consult  Post Acute Care Choice:  NA Choice offered to:  NA  Status of Service:  Completed, signed off  Sherald Barge, RN 06/22/2016, 1:08 PM

## 2016-06-22 NOTE — ED Notes (Signed)
Pt giving a diet drink after speaking to Dr Shanon Brow.

## 2016-06-23 ENCOUNTER — Ambulatory Visit (HOSPITAL_COMMUNITY): Payer: Self-pay

## 2016-06-23 DIAGNOSIS — R509 Fever, unspecified: Secondary | ICD-10-CM | POA: Diagnosis not present

## 2016-06-23 DIAGNOSIS — N183 Chronic kidney disease, stage 3 (moderate): Secondary | ICD-10-CM | POA: Diagnosis not present

## 2016-06-23 DIAGNOSIS — C50912 Malignant neoplasm of unspecified site of left female breast: Secondary | ICD-10-CM

## 2016-06-23 DIAGNOSIS — C773 Secondary and unspecified malignant neoplasm of axilla and upper limb lymph nodes: Secondary | ICD-10-CM | POA: Diagnosis not present

## 2016-06-23 LAB — CBC WITH DIFFERENTIAL/PLATELET
Basophils Absolute: 0.1 10*3/uL (ref 0.0–0.1)
Basophils Relative: 0 %
EOS PCT: 3 %
Eosinophils Absolute: 0.4 10*3/uL (ref 0.0–0.7)
HCT: 35.7 % — ABNORMAL LOW (ref 36.0–46.0)
Hemoglobin: 11.5 g/dL — ABNORMAL LOW (ref 12.0–15.0)
LYMPHS ABS: 4.6 10*3/uL — AB (ref 0.7–4.0)
Lymphocytes Relative: 39 %
MCH: 28.3 pg (ref 26.0–34.0)
MCHC: 32.2 g/dL (ref 30.0–36.0)
MCV: 87.9 fL (ref 78.0–100.0)
MONOS PCT: 11 %
Monocytes Absolute: 1.2 10*3/uL — ABNORMAL HIGH (ref 0.1–1.0)
Neutro Abs: 5.4 10*3/uL (ref 1.7–7.7)
Neutrophils Relative %: 47 %
PLATELETS: 353 10*3/uL (ref 150–400)
RBC: 4.06 MIL/uL (ref 3.87–5.11)
RDW: 16.6 % — AB (ref 11.5–15.5)
WBC: 11.6 10*3/uL — ABNORMAL HIGH (ref 4.0–10.5)

## 2016-06-23 LAB — GLUCOSE, CAPILLARY
GLUCOSE-CAPILLARY: 247 mg/dL — AB (ref 65–99)
Glucose-Capillary: 354 mg/dL — ABNORMAL HIGH (ref 65–99)

## 2016-06-23 NOTE — Progress Notes (Signed)
Inpatient Diabetes Program Recommendations  AACE/ADA: New Consensus Statement on Inpatient Glycemic Control (2015)  Target Ranges:  Prepandial:   less than 140 mg/dL      Peak postprandial:   less than 180 mg/dL (1-2 hours)      Critically ill patients:  140 - 180 mg/dL   Results for Margaret Mcdaniel, [place] (MRN [id number]) as of 06/23/2016 09:38  Ref. Range 06/22/2016 07:59 06/22/2016 11:26 06/22/2016 16:34 06/22/2016 21:36 06/23/2016 07:35  Glucose-Capillary Latest Ref Range: 65 - 99 mg/dL 168 (H) 261 (H) 188 (H) 215 (H) 247 (H)   Review of Glycemic Control  Diabetes history: DM2 Outpatient Diabetes medications: Tresiba 36 units QHS, 70/30 30 units BID Current orders for Inpatient glycemic control: Novolog 0-9 units TID with meals  Inpatient Diabetes Program Recommendations: Insulin - Basal: Please consider ordering Lantus 10 units Q24H (starting now). Correction (SSI): Please consider ordering Novolog 0-5 units QHS for bedtime correction scale.  Thanks, Barnie Alderman, RN, MSN, CDE Diabetes Coordinator Inpatient Diabetes Program [phone] (Team Pager from 8am to 5pm)

## 2016-06-23 NOTE — Care Management Note (Signed)
Case Management Note  Patient Details  Name: Margaret Mcdaniel MRN: [id number] Date of Birth: [birth year]   Expected Discharge Date:  06/23/16               Expected Discharge Plan:  Home/Self Care  In-House Referral:  NA  Discharge planning Services  CM Consult  Post Acute Care Choice:  NA Choice offered to:  NA  Status of Service:  Completed, signed off  Additional Comments: Pt discharged home with self care. No need communicated by pt or family.   Sherald Barge, RN 06/23/2016, 1:40 PM

## 2016-06-23 NOTE — Progress Notes (Signed)
Patient is to be discharged home and in stable condition. Patient verbalizes understanding of follow-up appointments and is aware that there were no changes made to her medication regimen. Margaret Mcdaniel, patient's daughter has been called to come and pick her up. Patient will be escorted out by staff upon daughter's arrival.  Margaret Khat, RN

## 2016-06-23 NOTE — Discharge Summary (Signed)
Physician Discharge Summary  Margaret Mcdaniel ZDG:[id number] DOB: 1950-12-13 DOA: 06/21/2016  PCP: Wende Neighbors, MD  Admit date: 06/21/2016 Discharge date: 06/23/2016  Time spent: 45 minutes  Recommendations for Outpatient Follow-up:  -Will be discharged home today. -Advised to follow up with PCP in 2 weeks and with oncologist for chemotherapy as scheduled.   Discharge Diagnoses:  Principal Problem:   Fever Active Problems:   Breast cancer metastasized to axillary lymph node, left (HCC)   CKD (chronic kidney disease) stage 3, GFR 30-59 ml/min   Uncontrolled type 2 diabetes mellitus with hyperglycemia, with long-term current use of insulin ([place])   Discharge Condition: Stable and improved  Filed Weights   06/21/16 2029 06/22/16 0143  Weight: 88.5 kg (195 lb) 88.5 kg (195 lb 1.7 oz)    History of present illness:  As per Dr. Shanon Brow 4/17: Margaret Mcdaniel is a 66 y.o. female with medical history significant of  Breast cancer on chemo d/c last week with pyelo completed course of levaquin 6 days ago was doing well when she reports a fever again earlier today a peak of 100.6.  ED reported it was over 102 at home but she denies this.  She says she has some lower back pain, but no flank pain.  No urinary symptoms at all, no dysuria or hematuria.  No n/v/d.  No URI, no cough.  No sob.  No rash.  Pt has mildly elevated lactate around 2.6 and neg ua and cxr.  Referred for admission for fever in chemo patient.  Ct shows complications from recent pyelonephritis and urine cx from last week showed multiorganisms suggested reculturing.     Hospital Course:   Fever/Leukocytosis -No etiology identified. -UA/CXr without signs of infection. -Cx remain negative. -Not neutropenic. -Ok to DC home today; no need for abx.  Breast Cancer -Continue OP follow up with oncology as scheduled.  Procedures:  None   Consultations:  None  Discharge Instructions  Discharge Instructions    Diet - low  sodium heart healthy    Complete by:  As directed    Increase activity slowly    Complete by:  As directed      Allergies as of 06/23/2016      Reactions   Keflex [cephalexin] Anaphylaxis   Pt "codes" on this medication.   Sulfa Antibiotics Hives   Latex Rash      Medication List    STOP taking these medications   levofloxacin 750 MG tablet Commonly known as:  LEVAQUIN   NOVOLOG MIX 70/30 FLEXPEN (70-30) 100 UNIT/ML FlexPen Generic drug:  insulin aspart protamine - aspart     TAKE these medications   allopurinol 100 MG tablet Commonly known as:  ZYLOPRIM Take 100 mg by mouth daily.   aspirin EC 81 MG tablet Take 81 mg by mouth daily.   chlorproMAZINE 50 MG tablet Commonly known as:  THORAZINE Take 100 mg by mouth at bedtime.   CYCLOPHOSPHAMIDE IV Inject into the vein. Every 3 weeks   dexamethasone 4 MG tablet Commonly known as:  DECADRON Take 2 tablets (8 mg total) by mouth 2 (two) times daily. Start the day before Taxotere. Then again the day after chemo for 3 days.   HYDROcodone-acetaminophen 5-325 MG tablet Commonly known as:  NORCO Take 1 tablet by mouth every 6 (six) hours as needed for moderate pain.   levothyroxine 50 MCG tablet Commonly known as:  SYNTHROID, LEVOTHROID Take 50 mcg by mouth daily before breakfast.  lidocaine-prilocaine cream Commonly known as:  EMLA Apply to affected area once What changed:  how much to take  how to take this  when to take this  additional instructions   LORazepam 0.5 MG tablet Commonly known as:  ATIVAN Take 1 tablet (0.5 mg total) by mouth every 6 (six) hours as needed (Nausea or vomiting).   mirtazapine 15 MG tablet Commonly known as:  REMERON Take 15 mg by mouth at bedtime.   NEULASTA ONPRO [place]ts Inject into the skin. Every 21 days   omeprazole 40 MG capsule Commonly known as:  PRILOSEC Take 40 mg by mouth daily.   ondansetron 8 MG tablet Commonly known as:  ZOFRAN Take 1 tablet (8 mg total) by  mouth 2 (two) times daily as needed for refractory nausea / vomiting. Start on day 3 after chemo.   polyethylene glycol powder powder Commonly known as:  GLYCOLAX/MIRALAX Take 17 g by mouth daily.   prochlorperazine 10 MG tablet Commonly known as:  COMPAZINE Take 1 tablet (10 mg total) by mouth every 6 (six) hours as needed (Nausea or vomiting).   RESTORIL 15 MG capsule Generic drug:  temazepam Take 15 mg by mouth at bedtime.   simvastatin 40 MG tablet Commonly known as:  ZOCOR Take 40 mg by mouth at bedtime.   TAXOTERE IV Inject into the vein. Every 3 weeks   tiZANidine 4 MG tablet Commonly known as:  ZANAFLEX Take 8 mg by mouth 2 (two) times daily.   TRESIBA FLEXTOUCH 100 UNIT/ML Sopn FlexTouch Pen Generic drug:  insulin degludec Inject 36 Units into the skin at bedtime.      Allergies  Allergen Reactions  . Keflex [Cephalexin] Anaphylaxis    Pt "codes" on this medication.  . Sulfa Antibiotics Hives  . Latex Rash   Follow-up Information    Wende Neighbors, MD. Schedule an appointment as soon as possible for a visit in 2 week(s).   Specialty:  Internal Medicine Contact information: [place] [phone]            The results of significant diagnostics from this hospitalization (including imaging, microbiology, ancillary and laboratory) are listed below for reference.    Significant Diagnostic Studies: Dg Chest 2 View  Result Date: 06/07/2016 CLINICAL DATA:  Fever.  Cancer patient. EXAM: CHEST  2 VIEW COMPARISON:  05/26/2016 and 05/14/2016 FINDINGS: Power injectable Port-A-Cath tip:  Upper SVC. Thoracic spondylosis. Left axillary clips in the setting of prior left mastectomy. Atherosclerotic calcification of the aortic arch. No airspace opacity or pleural effusion. Slightly prominent pericardial adipose tissue as on prior exams, particularly along the cardiac apex. IMPRESSION: 1. No pneumonia or thoracic cause for the patient's fever  is identified. 2.  Atherosclerotic calcification of the aortic arch. 3. Thoracic spondylosis. 4. Left mastectomy. Electronically Signed   By: Van Clines M.D.   On: 06/07/2016 20:01   Ct Abdomen Pelvis W Contrast  Result Date: 06/22/2016 CLINICAL DATA:  History of breast cancer and chronic kidney disease fever with bilateral low back pain EXAM: CT ABDOMEN AND PELVIS WITH CONTRAST TECHNIQUE: Multidetector CT imaging of the abdomen and pelvis was performed using the standard protocol following bolus administration of intravenous contrast. CONTRAST:  160mL ISOVUE-300 IOPAMIDOL (ISOVUE-300) INJECTION 61% COMPARISON:  06/08/2016 FINDINGS: Lower chest: Lung bases demonstrate no acute consolidation or pleural effusion. Hepatobiliary: No focal hepatic abnormality. Hepatic steatosis. Surgical clips in the gallbladder fossa. No biliary dilatation. Pancreas: Unremarkable. No pancreatic ductal dilatation or surrounding inflammatory  changes. Spleen: Normal in size without focal abnormality. Adrenals/Urinary Tract: Right adrenal gland normal. Stable mild nodularity of left adrenal gland. No hydronephrosis. Nonobstructing stones in the left kidney, largest stone is seen in the midpole and measures 4 mm. Cortical scarring of the left kidney. Subcentimeter hypodensity in the mid to lower left kidney too small to further characterize. The bladder is grossly unremarkable. Stomach/Bowel: The stomach demonstrates postsurgical changes. No evidence for a small bowel obstruction. No colon wall thickening. Vascular/Lymphatic: Aortic atherosclerosis. Subcentimeter peripancreatic lymph nodes are unchanged. No pelvic adenopathy Reproductive: Stable 1.9 cm low-attenuation left adnexal lesion. Other: No free air or free fluid. Stable peripherally calcified lesion with fluid density in the anterior abdominal wall. Right lateral abdominal wall hernia containing a portion of colon, unchanged. Musculoskeletal: Degenerative changes of the  spine. No acute or suspicious bone lesion. Absent coccyx with posterior mesh material. Possible rectal prolapse again noted. IMPRESSION: 1. No perinephric fluid collections or abscess is visualized. 2. Suspect mild hepatic steatosis 3. Nonobstructing stones in the left kidney 4. Stable peripherally calcified probable hernia mesh with fluid density in the anterior abdominal wall. 5. Right lateral abdominal wall hernia containing a small amount of colon. No obstruction. 6. Mesh material in the coccygeal region.  Possible rectal prolapse. Electronically Signed   By: Donavan Foil M.D.   On: 06/22/2016 00:20   Ct Abdomen Pelvis W Contrast  Result Date: 06/09/2016 CLINICAL DATA:  Low back pain for 4 days.  Diarrhea. EXAM: CT ABDOMEN AND PELVIS WITH CONTRAST TECHNIQUE: Multidetector CT imaging of the abdomen and pelvis was performed using the standard protocol following bolus administration of intravenous contrast. CONTRAST:  166mL ISOVUE-300 IOPAMIDOL (ISOVUE-300) INJECTION 61% COMPARISON:  05/14/2016 FINDINGS: Lower chest: Descending thoracic aortic atherosclerotic calcification. Hepatobiliary: Somewhat low-density liver, possible steatosis. Cholecystectomy. No specific focal liver lesion is identified. Pancreas: Unremarkable Spleen: Unremarkable Adrenals/Urinary Tract: Mild scarring in the left kidney lower pole anteriorly. Several nonobstructive left renal calculi measuring up to 3 mm in diameter in the mid kidney. Slightly dilated right mid ureteral segment without obvious cause. Adrenal glands unremarkable. Stomach/Bowel: Postoperative findings along the anterior margin of the stomach. Small right Spigelian hernia with a knuckle of ascending colon extending into the hernia on image 35/2, without findings complicating feature. There is formed stool in the sigmoid colon rectum although there is also a small amount of fluid in the rectal vault distally near the anorectal junction. Vascular/Lymphatic: Peripancreatic  node 1 cm in short axis on image 24/2. Small periaortic lymph nodes are not enlarged. Aortoiliac atherosclerotic vascular disease. Reproductive: Unremarkable Other: No supplemental non-categorized findings. Musculoskeletal: Ventral density suggesting possible hernia mesh along the anterior abdominal wall with fluid collection along its margin as on image 37/2, not appreciably changed from the earlier exam. Absent coccyx with a flat mesh in the expected location of the coccyx as on image 68/5, with pelvic floor laxity and potentially mild rectal prolapse. L5 is partially sacralized. Facet arthropathy and intervertebral spurring in the lumbar spine along with suspected degenerative disc disease at L3-4 and L4-5 potentially causing mild levels of impingement. IMPRESSION: 1. Possible rectal prolapse with pelvic floor laxity. This is at least partially related to the absence of the coccyx and the posterior mesh implant in the vicinity of the coccyx. In the rectal vault there is both formed stool and some fluid, along with a potentially patulous anorectal junction. 2. Essentially stable appearance of fluid collection along a calcified anterior abdominal wall mesh. 3. Spigelian hernia on the  right contains a small knuckle of colon, without current evidence of complicating feature. 4. Suspected hepatic steatosis. 5.  Aortoiliac atherosclerotic vascular disease. 6. Potential mild levels of impingement in the lumbar spine due to spondylosis and degenerative disc disease. 7. Nonobstructive left nephrolithiasis. Mild scarring in the left kidney lower pole anteriorly. Electronically Signed   By: Van Clines M.D.   On: 06/09/2016 07:33   Dg Chest Portable 1 View  Result Date: 06/21/2016 CLINICAL DATA:  Fever and low back pain EXAM: PORTABLE CHEST 1 VIEW COMPARISON:  06/07/2016 FINDINGS: Non inclusion of the CP angles. Left axillary surgical clips. No acute consolidation or effusion. Stable cardiomediastinal silhouette  with atherosclerosis. Right-sided central venous port tip overlies the upper SVC. IMPRESSION: Non inclusion of the CP angles. No radiographic evidence for acute cardiopulmonary abnormality. Electronically Signed   By: Donavan Foil M.D.   On: 06/21/2016 22:11   Dg Chest Port 1 View  Result Date: 05/26/2016 CLINICAL DATA:  Port-A-Cath placement.  Breast cancer. EXAM: PORTABLE CHEST 1 VIEW COMPARISON:  04/12/2016 chest radiograph. FINDINGS: Surgical clips overlie the left axilla. Right subclavian MediPort terminates in the upper third of the superior vena cava. Stable cardiomediastinal silhouette with normal heart size and aortic atherosclerosis. No pneumothorax. No pleural effusion. No overt pulmonary edema. No acute consolidative airspace disease. IMPRESSION: 1. No pneumothorax. 2. Right subclavian Port-A-Cath with tip in the upper third of the SCC. 3. No active cardiopulmonary disease. 4. Aortic atherosclerosis. Electronically Signed   By: Ilona Sorrel M.D.   On: 05/26/2016 11:26   Dg C-arm 1-60 Min-no Report  Result Date: 05/26/2016 Fluoroscopy was utilized by the requesting physician.  No radiographic interpretation.    Microbiology: Recent Results (from the past 240 hour(s))  Blood culture (routine x 2)     Status: None (Preliminary result)   Collection Time: 06/21/16  9:29 PM  Result Value Ref Range Status   Specimen Description SITE NOT SPECIFIED  Final   Special Requests Blood Culture adequate volume  Final   Culture NO GROWTH 2 DAYS  Final   Report Status PENDING  Incomplete  Blood culture (routine x 2)     Status: None (Preliminary result)   Collection Time: 06/21/16  9:52 PM  Result Value Ref Range Status   Specimen Description RIGHT ANTECUBITAL  Final   Special Requests Blood Culture adequate volume  Final   Culture NO GROWTH 2 DAYS  Final   Report Status PENDING  Incomplete     Labs: Basic Metabolic Panel:  Recent Labs Lab 06/21/16 2129  NA 132*  K 3.8  CL 99*  CO2  23  GLUCOSE 273*  BUN 17  CREATININE 1.09*  CALCIUM 8.8*   Liver Function Tests:  Recent Labs Lab 06/21/16 2129  AST 38  ALT 37  ALKPHOS 77  BILITOT 0.5  PROT 7.3  ALBUMIN 3.8   No results for input(s): LIPASE, AMYLASE in the last 168 hours. No results for input(s): AMMONIA in the last 168 hours. CBC:  Recent Labs Lab 06/21/16 2129 06/23/16 0405  WBC 18.9* 11.6*  NEUTROABS 10.0* 5.4  HGB 12.7 11.5*  HCT 38.8 35.7*  MCV 87.0 87.9  PLT 416* 353   Cardiac Enzymes: No results for input(s): CKTOTAL, CKMB, CKMBINDEX, TROPONINI in the last 168 hours. BNP: BNP (last 3 results) No results for input(s): BNP in the last 8760 hours.  ProBNP (last 3 results) No results for input(s): PROBNP in the last 8760 hours.  CBG:  Recent Labs Lab  06/22/16 1126 06/22/16 1634 06/22/16 2136 06/23/16 0735 06/23/16 1116  GLUCAP 261* 188* 215* 247* 354*       Signed:  [facility] Pager: [phone] 06/23/2016, 11:46 AM

## 2016-06-24 LAB — URINE CULTURE

## 2016-06-26 LAB — CULTURE, BLOOD (ROUTINE X 2)
Culture: NO GROWTH
Culture: NO GROWTH
Special Requests: ADEQUATE
Special Requests: ADEQUATE

## 2016-06-28 ENCOUNTER — Encounter (HOSPITAL_COMMUNITY): Payer: Self-pay | Admitting: Oncology

## 2016-06-28 ENCOUNTER — Encounter (HOSPITAL_BASED_OUTPATIENT_CLINIC_OR_DEPARTMENT_OTHER): Payer: PPO | Admitting: Oncology

## 2016-06-28 ENCOUNTER — Encounter (HOSPITAL_BASED_OUTPATIENT_CLINIC_OR_DEPARTMENT_OTHER): Payer: PPO

## 2016-06-28 VITALS — BP 145/74 | HR 73 | Temp 97.8°F | Resp 18 | Wt 194.0 lb

## 2016-06-28 DIAGNOSIS — C773 Secondary and unspecified malignant neoplasm of axilla and upper limb lymph nodes: Secondary | ICD-10-CM

## 2016-06-28 DIAGNOSIS — N12 Tubulo-interstitial nephritis, not specified as acute or chronic: Secondary | ICD-10-CM

## 2016-06-28 DIAGNOSIS — N183 Chronic kidney disease, stage 3 unspecified: Secondary | ICD-10-CM

## 2016-06-28 DIAGNOSIS — C50912 Malignant neoplasm of unspecified site of left female breast: Secondary | ICD-10-CM

## 2016-06-28 DIAGNOSIS — Z794 Long term (current) use of insulin: Secondary | ICD-10-CM

## 2016-06-28 DIAGNOSIS — Z5111 Encounter for antineoplastic chemotherapy: Secondary | ICD-10-CM

## 2016-06-28 DIAGNOSIS — F419 Anxiety disorder, unspecified: Secondary | ICD-10-CM | POA: Diagnosis not present

## 2016-06-28 DIAGNOSIS — Z79899 Other long term (current) drug therapy: Secondary | ICD-10-CM | POA: Diagnosis not present

## 2016-06-28 DIAGNOSIS — E1165 Type 2 diabetes mellitus with hyperglycemia: Secondary | ICD-10-CM

## 2016-06-28 DIAGNOSIS — J449 Chronic obstructive pulmonary disease, unspecified: Secondary | ICD-10-CM | POA: Diagnosis not present

## 2016-06-28 DIAGNOSIS — C50212 Malignant neoplasm of upper-inner quadrant of left female breast: Secondary | ICD-10-CM

## 2016-06-28 DIAGNOSIS — Z9049 Acquired absence of other specified parts of digestive tract: Secondary | ICD-10-CM | POA: Diagnosis not present

## 2016-06-28 DIAGNOSIS — E119 Type 2 diabetes mellitus without complications: Secondary | ICD-10-CM | POA: Diagnosis not present

## 2016-06-28 DIAGNOSIS — Z87891 Personal history of nicotine dependence: Secondary | ICD-10-CM | POA: Diagnosis not present

## 2016-06-28 DIAGNOSIS — C50512 Malignant neoplasm of lower-outer quadrant of left female breast: Secondary | ICD-10-CM

## 2016-06-28 DIAGNOSIS — Z9889 Other specified postprocedural states: Secondary | ICD-10-CM | POA: Diagnosis not present

## 2016-06-28 DIAGNOSIS — G4730 Sleep apnea, unspecified: Secondary | ICD-10-CM | POA: Diagnosis not present

## 2016-06-28 DIAGNOSIS — Z170 Estrogen receptor positive status [ER+]: Secondary | ICD-10-CM | POA: Diagnosis not present

## 2016-06-28 DIAGNOSIS — E039 Hypothyroidism, unspecified: Secondary | ICD-10-CM | POA: Diagnosis not present

## 2016-06-28 DIAGNOSIS — Z9012 Acquired absence of left breast and nipple: Secondary | ICD-10-CM | POA: Diagnosis not present

## 2016-06-28 DIAGNOSIS — Z888 Allergy status to other drugs, medicaments and biological substances status: Secondary | ICD-10-CM | POA: Diagnosis not present

## 2016-06-28 LAB — COMPREHENSIVE METABOLIC PANEL
ALBUMIN: 3.7 g/dL (ref 3.5–5.0)
ALK PHOS: 79 U/L (ref 38–126)
ALT: 27 U/L (ref 14–54)
AST: 28 U/L (ref 15–41)
Anion gap: 14 (ref 5–15)
BUN: 21 mg/dL — ABNORMAL HIGH (ref 6–20)
CO2: 21 mmol/L — AB (ref 22–32)
CREATININE: 1.15 mg/dL — AB (ref 0.44–1.00)
Calcium: 8.9 mg/dL (ref 8.9–10.3)
Chloride: 98 mmol/L — ABNORMAL LOW (ref 101–111)
GFR calc Af Amer: 56 mL/min — ABNORMAL LOW (ref >60)
GFR calc non Af Amer: 48 mL/min — ABNORMAL LOW (ref >60)
GLUCOSE: 616 mg/dL — AB (ref 65–99)
Potassium: 4.2 mmol/L (ref 3.5–5.1)
SODIUM: 133 mmol/L — AB (ref 135–145)
Total Bilirubin: 0.6 mg/dL (ref 0.3–1.2)
Total Protein: 7.1 g/dL (ref 6.5–8.1)

## 2016-06-28 LAB — CBC WITH DIFFERENTIAL/PLATELET
BASOS PCT: 0 %
Basophils Absolute: 0 10*3/uL (ref 0.0–0.1)
Eosinophils Absolute: 0 10*3/uL (ref 0.0–0.7)
Eosinophils Relative: 0 %
HCT: 37.3 % (ref 36.0–46.0)
Hemoglobin: 12.1 g/dL (ref 12.0–15.0)
LYMPHS ABS: 1.5 10*3/uL (ref 0.7–4.0)
Lymphocytes Relative: 10 %
MCH: 28.4 pg (ref 26.0–34.0)
MCHC: 32.4 g/dL (ref 30.0–36.0)
MCV: 87.6 fL (ref 78.0–100.0)
Monocytes Absolute: 0.2 10*3/uL (ref 0.1–1.0)
Monocytes Relative: 1 %
NEUTROS PCT: 89 %
Neutro Abs: 12.9 10*3/uL — ABNORMAL HIGH (ref 1.7–7.7)
Platelets: 260 10*3/uL (ref 150–400)
RBC: 4.26 MIL/uL (ref 3.87–5.11)
RDW: 16.1 % — ABNORMAL HIGH (ref 11.5–15.5)
WBC: 14.6 10*3/uL — AB (ref 4.0–10.5)

## 2016-06-28 LAB — GLUCOSE, CAPILLARY
GLUCOSE-CAPILLARY: 589 mg/dL — AB (ref 65–99)
Glucose-Capillary: >600 mg/dL (ref 65–99)

## 2016-06-28 MED ORDER — SODIUM CHLORIDE 0.9 % IV SOLN: 10.0000 mg | Freq: Once | INTRAVENOUS | Status: DC

## 2016-06-28 MED ORDER — DOCETAXEL CHEMO INJECTION 160 MG/16ML
75.0000 mg/m2 | Freq: Once | INTRAVENOUS | Status: AC
  Administered 2016-06-28: 150 mg via INTRAVENOUS
  Filled 2016-06-28: qty 15

## 2016-06-28 MED ORDER — DEXAMETHASONE SODIUM PHOSPHATE 10 MG/ML IJ SOLN
5.0000 mg | Freq: Once | INTRAMUSCULAR | Status: AC
  Administered 2016-06-28: 5 mg via INTRAVENOUS

## 2016-06-28 MED ORDER — INSULIN ASPART 100 UNIT/ML ~~LOC~~ SOLN
10.0000 [IU] | Freq: Once | SUBCUTANEOUS | Status: AC
  Administered 2016-06-28: 10 [IU] via SUBCUTANEOUS
  Filled 2016-06-28: qty 0.1

## 2016-06-28 MED ORDER — PALONOSETRON HCL INJECTION 0.25 MG/5ML
INTRAVENOUS | Status: AC
  Filled 2016-06-28: qty 5

## 2016-06-28 MED ORDER — SODIUM CHLORIDE 0.9 % IV SOLN
600.0000 mg/m2 | Freq: Once | INTRAVENOUS | Status: AC
  Administered 2016-06-28: 1200 mg via INTRAVENOUS
  Filled 2016-06-28: qty 10

## 2016-06-28 MED ORDER — DOCETAXEL CHEMO INJECTION 160 MG/16ML: 75.0000 mg/m2 | Freq: Once | INTRAVENOUS | Status: DC

## 2016-06-28 MED ORDER — PALONOSETRON HCL INJECTION 0.25 MG/5ML
0.2500 mg | Freq: Once | INTRAVENOUS | Status: AC
  Administered 2016-06-28: 0.25 mg via INTRAVENOUS

## 2016-06-28 MED ORDER — SODIUM CHLORIDE 0.9 % IV SOLN
Freq: Once | INTRAVENOUS | Status: AC
  Administered 2016-06-28: 10:00:00 via INTRAVENOUS

## 2016-06-28 MED ORDER — HEPARIN SOD (PORK) LOCK FLUSH 100 UNIT/ML IV SOLN
500.0000 [IU] | Freq: Once | INTRAVENOUS | Status: AC | PRN
  Administered 2016-06-28: 500 [IU]

## 2016-06-28 MED ORDER — INSULIN ASPART 100 UNIT/ML ~~LOC~~ SOLN
12.0000 [IU] | Freq: Once | SUBCUTANEOUS | Status: AC
  Administered 2016-06-28: 12 [IU] via SUBCUTANEOUS
  Filled 2016-06-28: qty 0.12

## 2016-06-28 MED ORDER — DEXAMETHASONE SODIUM PHOSPHATE 10 MG/ML IJ SOLN
INTRAMUSCULAR | Status: AC
  Filled 2016-06-28: qty 1

## 2016-06-28 MED ORDER — SODIUM CHLORIDE 0.9% FLUSH
10.0000 mL | INTRAVENOUS | Status: DC | PRN
  Administered 2016-06-28: 10 mL
  Filled 2016-06-28: qty 10

## 2016-06-28 NOTE — Assessment & Plan Note (Signed)
Stage III CKD- stable

## 2016-06-28 NOTE — Patient Instructions (Signed)
[facility] Discharge Instructions for Patients Receiving Chemotherapy   Beginning January 23rd 2017 lab work for the Cancer Center will be done in the  Main lab at [facility] on 1st floor. If you have a lab appointment with the Cancer Center please come in thru the  Main Entrance and check in at the main information desk   Today you received the following chemotherapy agents   To help prevent nausea and vomiting after your treatment, we encourage you to take your nausea medication     If you develop nausea and vomiting, or diarrhea that is not controlled by your medication, call the clinic.  The clinic phone number is [phone]. Office hours are Monday-Friday 8:30am-5:00pm.  BELOW ARE SYMPTOMS THAT SHOULD BE REPORTED IMMEDIATELY:  *FEVER GREATER THAN 101.0 F  *CHILLS WITH OR WITHOUT FEVER  NAUSEA AND VOMITING THAT IS NOT CONTROLLED WITH YOUR NAUSEA MEDICATION  *UNUSUAL SHORTNESS OF BREATH  *UNUSUAL BRUISING OR BLEEDING  TENDERNESS IN MOUTH AND THROAT WITH OR WITHOUT PRESENCE OF ULCERS  *URINARY PROBLEMS  *BOWEL PROBLEMS  UNUSUAL RASH Items with * indicate a potential emergency and should be followed up as soon as possible. If you have an emergency after office hours please contact your primary care physician or go to the nearest emergency department.  Please call the clinic during office hours if you have any questions or concerns.   You may also contact the Patient Navigator at [phone] should you have any questions or need assistance in obtaining follow up care.      Resources For Cancer Patients and their Caregivers ? American Cancer Society: Can assist with transportation, wigs, general needs, runs Look Good Feel Better.        1-888-227-6333 ? Cancer Care: Provides financial assistance, online support groups, medication/co-pay assistance.  1-800-813-HOPE (4673) ? [facility] Assists [place] cancer  patients and their families through emotional , educational and financial support.  336-427-4357 ? [place] DSS Where to apply for food stamps, Medicaid and utility assistance. 336-342-1394 ? [facility]: Transportation to medical appointments. 336-347-2287 ? Social Security Administration: May apply for disability if have a Stage IV cancer. 336-342-7796 1-800-772-1213 ? [place] Aging, Disability and Transit Services: Assists with nutrition, care and transit needs. 336-349-2343         

## 2016-06-28 NOTE — Progress Notes (Signed)
CRITICAL VALUE ALERT Critical value received:  Glucose 616 Date of notification:  06/28/2016 Time of notification: 0918 Critical value read back:  Yes.   Nurse who received alert:  Shellia Carwin RN MD notified (1st page):  Kirby Crigler PA

## 2016-06-28 NOTE — Assessment & Plan Note (Addendum)
Stage IB (T2N1AM0) left breast cancer, ER/PR 100%, HER2 NEGATIVE, measuring 2.2 cm in greatest dimension and anterior margin broadly positive for invasive ductal carcinoma (upper inner quadrant) and 1/6 positive lymph nodes.  Oncology history updated.  Pathologic staging in [facility] is completed.  Pre-treatment labs today: CBC diff, CMET.  I personally reviewed and went over laboratory results with the patient.  The results are noted within this dictation.  Labs satisfy treatment parameters.  Hospitalization medical records reviewed from last week.  Problem list reviewed with patient and edited accordingly.  Medications are reviewed with the patient and edited accordingly.  Return in 3 weeks for follow-up and cycle #3 of treatment.  More than 50% of the time spent with the patient was utilized for counseling and coordination of care.

## 2016-06-28 NOTE — Progress Notes (Signed)
0845 labs completed, blood sugar is 616. Kirby Crigler PA-C notified will give 10units per order.  1200-blood sugar was recorded as high on the glucometer. Will give insulin as ordered per Kirby Crigler.PA-C.   Blood sugar 589, will give 12 units of 70/30 per Kirby Crigler PA-C  Chemotherapy given today per orders. Patient tolerated it well. Vitals stable and discharged home ambulatory with son from clinic.follow up as scheduled.

## 2016-06-28 NOTE — Assessment & Plan Note (Addendum)
Uncontrolled DM, insulin dependent.  On Insulin 70/30- 30 units.  Glucose this AM > 600.    Order is placed for 10 units of regular insulin.  Will recheck glucose later during tx.  Recheck glucose was > 600 and therefore an additional 10 units of regular insulin was given.  Recheck glucose was > 500.  12 units of regular insulin was given and she was discharged from the clinic.

## 2016-06-28 NOTE — Addendum Note (Signed)
Addended by: [facility] on: 06/28/2016 09:28 AM   Modules accepted: Orders

## 2016-06-28 NOTE — Patient Instructions (Signed)
[facility] at [facility] Discharge Instructions  RECOMMENDATIONS MADE BY THE CONSULTANT AND ANY TEST RESULTS WILL BE SENT TO YOUR REFERRING PHYSICIAN.  You were seen today by Tom Kefalas PA-C.   Thank you for choosing [facility] at [facility] to provide your oncology and hematology care.  To afford each patient quality time with our provider, please arrive at least 15 minutes before your scheduled appointment time.    If you have a lab appointment with the Cancer Center please come in thru the  Main Entrance and check in at the main information desk  You need to re-schedule your appointment should you arrive 10 or more minutes late.  We strive to give you quality time with our providers, and arriving late affects you and other patients whose appointments are after yours.  Also, if you no show three or more times for appointments you may be dismissed from the clinic at the providers discretion.     Again, thank you for choosing [facility].  Our hope is that these requests will decrease the amount of time that you wait before being seen by our physicians.       _____________________________________________________________  Should you have questions after your visit to [facility], please contact our office at [phone] between the hours of 8:30 a.m. and 4:30 p.m.  Voicemails left after 4:30 p.m. will not be returned until the following business day.  For prescription refill requests, have your pharmacy contact our office.       Resources For Cancer Patients and their Caregivers ? American Cancer Society: Can assist with transportation, wigs, general needs, runs Look Good Feel Better.        1-888-227-6333 ? Cancer Care: Provides financial assistance, online support groups, medication/co-pay assistance.  1-800-813-HOPE (4673) ? [facility] Assists [place] cancer patients and their  families through emotional , educational and financial support.  336-427-4357 ? [place] DSS Where to apply for food stamps, Medicaid and utility assistance. 336-342-1394 ? [facility]: Transportation to medical appointments. 336-347-2287 ? Social Security Administration: May apply for disability if have a Stage IV cancer. 336-342-7796 1-800-772-1213 ? [place] Aging, Disability and Transit Services: Assists with nutrition, care and transit needs. 336-349-2343  Cancer Center Support Programs: @[email or web address]@ > Cancer Support Group  2nd Tuesday of the month 1pm-2pm, Journey Room  > Creative Journey  3rd Tuesday of the month 1130am-1pm, Journey Room  > Look Good Feel Better  1st Wednesday of the month 10am-12 noon, Journey Room (Call American Cancer Society to register 1-800-395-5775)    

## 2016-06-28 NOTE — Progress Notes (Addendum)
Margaret Neighbors, MD [place] [address]  Breast cancer metastasized to axillary lymph node, left (HCC)  CKD (chronic kidney disease) stage 3, GFR 30-59 ml/min  Pyelonephritis  Uncontrolled type 2 diabetes mellitus with hyperglycemia, with long-term current use of insulin ([place]) - Plan: insulin aspart (novoLOG) injection 10 Units  CURRENT THERAPY: TC beginning on 05/31/2016  INTERVAL HISTORY: Margaret Mcdaniel 66 y.o. female returns for followup of Stage IB (T2N1AM0) left breast cancer, ER/PR 100%, HER2 NEGATIVE, measuring 2.2 cm in greatest dimension and anterior margin broadly positive for invasive ductal carcinoma (upper inner quadrant) and 1/6 positive lymph nodes.    Breast cancer metastasized to axillary lymph node, left ([place])   04/14/2016 Initial Diagnosis    Breast cancer metastasized to axillary lymph node, left ([place])     04/14/2016 Surgery    Left modified radical mastectomy - INVASIVE GRADE 2 DUCTAL CARCINOMA, SPANNING 2.2 CM IN GREATEST DIMENSION. - ASSOCIATED INTERMEDIATE GRADE DUCTAL CARCINOMA IN SITU. - ANTERIOR MARGIN IS BROADLY POSITIVE FOR INVASIVE DUCTAL CARCINOMA IN AREA OF TUMOR (UPPER INNER QUADRANT). - OTHER MARGINS ARE NEGATIVE. - ONE OF SIX LYMPH NODES POSITIVE FOR METASTATIC DUCTAL CARCINOMA (1/6). - ER/PR positive, HER2 negative. - pT2 pN1a cM0      05/14/2016 Imaging    CT chest/abd/pelvis: IMPRESSION: 1. Left mastectomy changes with a seroma. Rounded regions of low attenuation in the axilla adjacent to surgical clips are favored to be postsurgical changes rather than mildly prominent lymph nodes. 2. The left adrenal gland is mildly thickened, likely due to hyperplasia. A tiny 6 mm nodule is likely a small adenoma. 3. Atherosclerotic changes in a non aneurysmal aorta. 4. Anterior hernia mesh is calcified. Fluid just deep to the hernia mesh is likely not acute and of doubtful acute significance.      05/26/2016 Procedure   Port-A-Cath insertion, needle aspiration of wound seroma by Dr. Arnoldo Morale      05/31/2016 -  Chemotherapy    The patient had palonosetron (ALOXI) injection 0.25 mg, 0.25 mg, Intravenous,  Once, 1 of 4 cycles  pegfilgrastim (NEULASTA) injection 6 mg, 6 mg, Subcutaneous,  Once, 1 of 4 cycles  cyclophosphamide (CYTOXAN) 1,200 mg in sodium chloride 0.9 % 250 mL chemo infusion, 600 mg/m2 = 1,200 mg, Intravenous,  Once, 1 of 4 cycles  DOCEtaxel (TAXOTERE) 150 mg in dextrose 5 % 250 mL chemo infusion, 75 mg/m2 = 150 mg, Intravenous,  Once, 1 of 4 cycles  for chemotherapy treatment.        06/07/2016 - 06/14/2016 Hospital Admission    Admit date: 06/07/2016 Admission diagnosis: Pyelonephritis Additional comments: Treated with antibiotics      06/21/2016 Treatment Plan Change    Tx deferred x 1 week due to hospitalization      06/22/2016 - 06/23/2016 Hospital Admission    Admit date: 06/22/2016 Admission diagnosis: Fever Additional comments: Source not identified       She is tolerating treatment well.  She denies any nausea or vomiting.  Appetite is good.  Weight is stable.  She denies any chest pain or abdominal pain.  She denies any diarrhea or constipation.  She denies any change in taste of foods.  She has been admitted on 2 separate occasions following her first cycle of chemotherapy.  First admission was for pyelonephritis.  Second admission was only for 24 hours for fever.  Workup was negative.  Review of Systems  Constitutional: Negative.  Negative for chills,  fever and weight loss.  HENT: Negative.   Eyes: Negative.   Respiratory: Negative.  Negative for cough.   Cardiovascular: Negative.  Negative for chest pain.  Gastrointestinal: Negative.  Negative for blood in stool, constipation, diarrhea, melena, nausea and vomiting.  Genitourinary: Negative.   Musculoskeletal: Negative.   Skin: Negative.   Neurological: Negative.  Negative for weakness.  Endo/Heme/Allergies: Negative.    Psychiatric/Behavioral: Negative.     Past Medical History:  Diagnosis Date  . Anxiety   . Arthritis   . Breast cancer metastasized to axillary lymph node, left ([place]) 04/14/2016  . Cancer ([place])    left breast  . CKD (chronic kidney disease) stage 3, GFR 30-59 ml/min 06/08/2016  . COPD (chronic obstructive pulmonary disease) ([place])   . Diabetes mellitus without complication ([place])   . GERD (gastroesophageal reflux disease)   . History of kidney stones   . Hypothyroidism   . Mental disorder   . Neuromuscular disorder ([place])   . PONV (postoperative nausea and vomiting)   . Sleep apnea    Uses CPAP  . Uncontrolled type 2 diabetes mellitus with hyperglycemia, with long-term current use of insulin ([place]) 06/08/2016    Past Surgical History:  Procedure Laterality Date  . ANKLE SURGERY Right    fusion  . APPENDECTOMY    . CHOLECYSTECTOMY    . EXPLORATORY LAPAROTOMY    . HEMORRHOID SURGERY    . MASTECTOMY MODIFIED RADICAL Left 04/14/2016   Procedure: LEFT MODIFIED RADICAL MASTECTOMY;  Surgeon: Aviva Signs, MD;  Location: [facility];  Service: General;  Laterality: Left;  . PORTACATH PLACEMENT N/A 05/26/2016   Procedure: INSERTION PORT-A-CATH RIGHT SUBCLAVIAN AND  DRAINAGE OF SEROMA LEFT CHEST;  Surgeon: Aviva Signs, MD;  Location: [facility];  Service: General;  Laterality: N/A;  . ROTATOR CUFF REPAIR    . TONSILLECTOMY    . TUBAL LIGATION      History reviewed. No pertinent family history.  Social History   Social History  . Marital status: Divorced    Spouse name: N/A  . Number of children: N/A  . Years of education: N/A   Social History Main Topics  . Smoking status: Former Smoker    Packs/day: 1.00    Years: 50.00    Types: Cigarettes    Quit date: 04/12/2014  . Smokeless tobacco: Never Used  . Alcohol use No  . Drug use: No  . Sexual activity: Not Asked   Other Topics Concern  . None   Social History Narrative  . None     PHYSICAL EXAMINATION  ECOG PERFORMANCE STATUS:  1 - Symptomatic but completely ambulatory  There were no vitals filed for this visit.  Vitals - 1 value per visit 0/76/2263  SYSTOLIC 335  DIASTOLIC 79  Pulse 456  Temperature 98.1  Respirations 16  Weight (lb) 194    GENERAL:alert, no distress, well nourished, well developed, comfortable, cooperative, obese and blunted affect, in chemo-recliner, accompanied by son. SKIN: skin color, texture, turgor are normal, no rashes or significant lesions HEAD: Normocephalic, No masses, lesions, tenderness or abnormalities EYES: normal, EOMI, Conjunctiva are pink and non-injected EARS: External ears normal OROPHARYNX:lips, buccal mucosa, and tongue normal and mucous membranes are moist  NECK: supple, trachea midline LYMPH:  no palpable lymphadenopathy BREAST:not examined LUNGS: clear to auscultation  HEART: regular rate & rhythm, no murmurs and no gallops ABDOMEN:abdomen soft, obese and normal bowel sounds BACK: Back symmetric, no curvature. EXTREMITIES:less then 2 second capillary refill, no joint deformities, effusion,  or inflammation, no skin discoloration, no cyanosis  NEURO: alert & oriented x 3 with fluent speech, no focal motor/sensory deficits.   LABORATORY DATA: CBC    Component Value Date/Time   WBC 14.6 (H) 06/28/2016 0832   RBC 4.26 06/28/2016 0832   HGB 12.1 06/28/2016 0832   HCT 37.3 06/28/2016 0832   PLT 260 06/28/2016 0832   MCV 87.6 06/28/2016 0832   MCH 28.4 06/28/2016 0832   MCHC 32.4 06/28/2016 0832   RDW 16.1 (H) 06/28/2016 0832   LYMPHSABS 1.5 06/28/2016 0832   MONOABS 0.2 06/28/2016 0832   EOSABS 0.0 06/28/2016 0832   BASOSABS 0.0 06/28/2016 0832      Chemistry      Component Value Date/Time   NA 133 (L) 06/28/2016 0832   K 4.2 06/28/2016 0832   CL 98 (L) 06/28/2016 0832   CO2 21 (L) 06/28/2016 0832   BUN 21 (H) 06/28/2016 0832   CREATININE 1.15 (H) 06/28/2016 0832      Component Value Date/Time   CALCIUM 8.9 06/28/2016 0832   ALKPHOS 79  06/28/2016 0832   AST 28 06/28/2016 0832   ALT 27 06/28/2016 0832   BILITOT 0.6 06/28/2016 0832        PENDING LABS:   RADIOGRAPHIC STUDIES:  Dg Chest 2 View  Result Date: 06/07/2016 CLINICAL DATA:  Fever.  Cancer patient. EXAM: CHEST  2 VIEW COMPARISON:  05/26/2016 and 05/14/2016 FINDINGS: Power injectable Port-A-Cath tip:  Upper SVC. Thoracic spondylosis. Left axillary clips in the setting of prior left mastectomy. Atherosclerotic calcification of the aortic arch. No airspace opacity or pleural effusion. Slightly prominent pericardial adipose tissue as on prior exams, particularly along the cardiac apex. IMPRESSION: 1. No pneumonia or thoracic cause for the patient's fever is identified. 2.  Atherosclerotic calcification of the aortic arch. 3. Thoracic spondylosis. 4. Left mastectomy. Electronically Signed   By: Gaylyn Rong M.D.   On: 06/07/2016 20:01   Ct Abdomen Pelvis W Contrast  Result Date: 06/22/2016 CLINICAL DATA:  History of breast cancer and chronic kidney disease fever with bilateral low back pain EXAM: CT ABDOMEN AND PELVIS WITH CONTRAST TECHNIQUE: Multidetector CT imaging of the abdomen and pelvis was performed using the standard protocol following bolus administration of intravenous contrast. CONTRAST:  ISOVUE-300 IOPAMIDOL (ISOVUE-300) INJECTION 61% COMPARISON:  06/08/2016 FINDINGS: Lower chest: Lung bases demonstrate no acute consolidation or pleural effusion. Hepatobiliary: No focal hepatic abnormality. Hepatic steatosis. Surgical clips in the gallbladder fossa. No biliary dilatation. Pancreas: Unremarkable. No pancreatic ductal dilatation or surrounding inflammatory changes. Spleen: Normal in size without focal abnormality. Adrenals/Urinary Tract: Right adrenal gland normal. Stable mild nodularity of left adrenal gland. No hydronephrosis. Nonobstructing stones in the left kidney, largest stone is seen in the midpole and measures 4 mm. Cortical scarring of the left  kidney. Subcentimeter hypodensity in the mid to lower left kidney too small to further characterize. The bladder is grossly unremarkable. Stomach/Bowel: The stomach demonstrates postsurgical changes. No evidence for a small bowel obstruction. No colon wall thickening. Vascular/Lymphatic: Aortic atherosclerosis. Subcentimeter peripancreatic lymph nodes are unchanged. No pelvic adenopathy Reproductive: Stable 1.9 cm low-attenuation left adnexal lesion. Other: No free air or free fluid. Stable peripherally calcified lesion with fluid density in the anterior abdominal wall. Right lateral abdominal wall hernia containing a portion of colon, unchanged. Musculoskeletal: Degenerative changes of the spine. No acute or suspicious bone lesion. Absent coccyx with posterior mesh material. Possible rectal prolapse again noted. IMPRESSION: 1. No perinephric fluid collections or abscess  is visualized. 2. Suspect mild hepatic steatosis 3. Nonobstructing stones in the left kidney 4. Stable peripherally calcified probable hernia mesh with fluid density in the anterior abdominal wall. 5. Right lateral abdominal wall hernia containing a small amount of colon. No obstruction. 6. Mesh material in the coccygeal region.  Possible rectal prolapse. Electronically Signed   By: Donavan Foil M.D.   On: 06/22/2016 00:20   Ct Abdomen Pelvis W Contrast  Result Date: 06/09/2016 CLINICAL DATA:  Low back pain for 4 days.  Diarrhea. EXAM: CT ABDOMEN AND PELVIS WITH CONTRAST TECHNIQUE: Multidetector CT imaging of the abdomen and pelvis was performed using the standard protocol following bolus administration of intravenous contrast. CONTRAST:  195m ISOVUE-300 IOPAMIDOL (ISOVUE-300) INJECTION 61% COMPARISON:  05/14/2016 FINDINGS: Lower chest: Descending thoracic aortic atherosclerotic calcification. Hepatobiliary: Somewhat low-density liver, possible steatosis. Cholecystectomy. No specific focal liver lesion is identified. Pancreas: Unremarkable  Spleen: Unremarkable Adrenals/Urinary Tract: Mild scarring in the left kidney lower pole anteriorly. Several nonobstructive left renal calculi measuring up to 3 mm in diameter in the mid kidney. Slightly dilated right mid ureteral segment without obvious cause. Adrenal glands unremarkable. Stomach/Bowel: Postoperative findings along the anterior margin of the stomach. Small right Spigelian hernia with a knuckle of ascending colon extending into the hernia on image 35/2, without findings complicating feature. There is formed stool in the sigmoid colon rectum although there is also a small amount of fluid in the rectal vault distally near the anorectal junction. Vascular/Lymphatic: Peripancreatic node 1 cm in short axis on image 24/2. Small periaortic lymph nodes are not enlarged. Aortoiliac atherosclerotic vascular disease. Reproductive: Unremarkable Other: No supplemental non-categorized findings. Musculoskeletal: Ventral density suggesting possible hernia mesh along the anterior abdominal wall with fluid collection along its margin as on image 37/2, not appreciably changed from the earlier exam. Absent coccyx with a flat mesh in the expected location of the coccyx as on image 68/5, with pelvic floor laxity and potentially mild rectal prolapse. L5 is partially sacralized. Facet arthropathy and intervertebral spurring in the lumbar spine along with suspected degenerative disc disease at L3-4 and L4-5 potentially causing mild levels of impingement. IMPRESSION: 1. Possible rectal prolapse with pelvic floor laxity. This is at least partially related to the absence of the coccyx and the posterior mesh implant in the vicinity of the coccyx. In the rectal vault there is both formed stool and some fluid, along with a potentially patulous anorectal junction. 2. Essentially stable appearance of fluid collection along a calcified anterior abdominal wall mesh. 3. Spigelian hernia on the right contains a small knuckle of colon,  without current evidence of complicating feature. 4. Suspected hepatic steatosis. 5.  Aortoiliac atherosclerotic vascular disease. 6. Potential mild levels of impingement in the lumbar spine due to spondylosis and degenerative disc disease. 7. Nonobstructive left nephrolithiasis. Mild scarring in the left kidney lower pole anteriorly. Electronically Signed   By: WVan ClinesM.D.   On: 06/09/2016 07:33   Dg Chest Portable 1 View  Result Date: 06/21/2016 CLINICAL DATA:  Fever and low back pain EXAM: PORTABLE CHEST 1 VIEW COMPARISON:  06/07/2016 FINDINGS: Non inclusion of the CP angles. Left axillary surgical clips. No acute consolidation or effusion. Stable cardiomediastinal silhouette with atherosclerosis. Right-sided central venous port tip overlies the upper SVC. IMPRESSION: Non inclusion of the CP angles. No radiographic evidence for acute cardiopulmonary abnormality. Electronically Signed   By: KDonavan FoilM.D.   On: 06/21/2016 22:11     PATHOLOGY:    ASSESSMENT AND PLAN:  Breast cancer metastasized to axillary lymph node, left (HCC) Stage IB (T2N1AM0) left breast cancer, ER/PR 100%, HER2 NEGATIVE, measuring 2.2 cm in greatest dimension and anterior margin broadly positive for invasive ductal carcinoma (upper inner quadrant) and 1/6 positive lymph nodes.  Oncology history updated.  Pathologic staging in [facility] is completed.  Pre-treatment labs today: CBC diff, CMET.  I personally reviewed and went over laboratory results with the patient.  The results are noted within this dictation.  Labs satisfy treatment parameters.  Hospitalization medical records reviewed from last week.  Problem list reviewed with patient and edited accordingly.  Medications are reviewed with the patient and edited accordingly.  Return in 3 weeks for follow-up and cycle #3 of treatment.  More than 50% of the time spent with the patient was utilized for counseling and coordination of care.   CKD (chronic  kidney disease) stage 3, GFR 30-59 ml/min Stage III CKD- stable  Pyelonephritis Resolved  Uncontrolled type 2 diabetes mellitus with hyperglycemia, with long-term current use of insulin (HCC) Uncontrolled DM, insulin dependent.  On Insulin 70/30- 30 units.  Glucose this AM > 600.    Order is placed for 10 units of regular insulin.  Will recheck glucose later during tx.  Recheck glucose was > 600 and therefore an additional 10 units of regular insulin was given.  Recheck glucose was > 500.  12 units of regular insulin was given and she was discharged from the clinic.   ORDERS PLACED FOR THIS ENCOUNTER: No orders of the defined types were placed in this encounter.   MEDICATIONS PRESCRIBED THIS ENCOUNTER: Meds ordered this encounter  Medications  . NOVOLOG MIX 70/30 FLEXPEN (70-30) 100 UNIT/ML FlexPen  . levofloxacin (LEVAQUIN) 750 MG tablet  . insulin aspart (novoLOG) injection 10 Units    THERAPY PLAN:  TC x 4 cycles followed by adjuvant XRT.  Following completion of XRT, she will be placed on an aromatase inhibitor for 5 years.  All questions were answered. The patient knows to call the clinic with any problems, questions or concerns. We can certainly see the patient much sooner if necessary.  Patient and plan discussed with Dr. Twana First and she is in agreement with the aforementioned.   This note is electronically signed by: Doy Mince 06/28/2016 1:14 PM

## 2016-06-28 NOTE — Assessment & Plan Note (Signed)
Resolved

## 2016-06-29 ENCOUNTER — Ambulatory Visit (INDEPENDENT_AMBULATORY_CARE_PROVIDER_SITE_OTHER): Payer: PPO | Admitting: Neurology

## 2016-06-29 ENCOUNTER — Encounter: Payer: Self-pay | Admitting: Neurology

## 2016-06-29 VITALS — BP 99/63 | HR 60 | Ht 64.0 in | Wt 195.0 lb

## 2016-06-29 DIAGNOSIS — F209 Schizophrenia, unspecified: Secondary | ICD-10-CM

## 2016-06-29 DIAGNOSIS — G2111 Neuroleptic induced parkinsonism: Secondary | ICD-10-CM | POA: Diagnosis not present

## 2016-06-29 DIAGNOSIS — G219 Secondary parkinsonism, unspecified: Secondary | ICD-10-CM

## 2016-06-29 DIAGNOSIS — G2581 Restless legs syndrome: Secondary | ICD-10-CM

## 2016-06-29 HISTORY — DX: Restless legs syndrome: G25.81

## 2016-06-29 HISTORY — DX: Secondary parkinsonism, unspecified: G21.9

## 2016-06-29 MED ORDER — CARBIDOPA-LEVODOPA 25-100 MG PO TABS: ORAL_TABLET | ORAL | 3 refills | Status: DC

## 2016-06-29 NOTE — Patient Instructions (Signed)
   We will start low dose sinemet 25/100 tablets, taking 1/2 in the morning and at midday, one at night.  Sinemet (carbidopa) may result in confusion or hallucinations, drowsiness, nausea, or dizziness. If any significant side effects are noted, please contact our office. Sinemet may not be well absorbed when taken with high protein meals, if tolerated it is best to take 30-45 minutes before you eat.

## 2016-06-29 NOTE — Progress Notes (Signed)
Reason for visit: Secondary parkinsonism  Referring physician: Dr. Lafayette Dragon is a 66 y.o. female  History of present illness:  Margaret Mcdaniel is a 66 year old right-handed white female with a history of schizophrenia. The patient has been on a multitude of antipsychotic medication throughout the years. She has been on Seroquel until about 2 years ago when she was placed on Thorazine after a stay at [facility]. The patient was placed on Thorazine 50 mg 3 times daily, but this resulted in a significant degree of drowsiness. In February 2018 this dose was cut back to 50 mg at night. The patient has been diagnosed with stage II breast cancer, status post left mastectomy. She is engaging in chemotherapy at this time. The patient has Compazine to take for nausea, she has not required this medication yet. The patient remains on Thorazine. The patient has developed a problem with resting tremors involving the right greater than left upper extremity that began in November 2017. The patient has had an alteration in her ability to ambulate, some difficulty getting out of a chair, and she is shuffling her feet. The patient has not had any falls. The patient has a blank expression on the face, she denies any drooling or problems with swallowing. She does not use a cane or a walker for ambulation. Her handwriting has become sloppy and small. The patient has a memory disorder as well. The patient speaks slowly and deliberately. The patient has had a recent urinary tract infection, and some difficulty controlling the bladder. She has had significant problems with blood sugar elevations, yesterday her blood sugar was greater than 600. She has had some chronic renal insufficiency. The patient denies any numbness or weakness of extremities. She has not been able to sleep well at night secondary to restless legs. She is sent to this office for an evaluation.  Past Medical History:    Diagnosis Date  . Anxiety   . Arthritis   . Breast cancer metastasized to axillary lymph node, left ([place]) 04/14/2016  . Cancer ([place])    left breast  . CKD (chronic kidney disease) stage 3, GFR 30-59 ml/min 06/08/2016  . COPD (chronic obstructive pulmonary disease) ([place])   . Diabetes mellitus without complication ([place])   . GERD (gastroesophageal reflux disease)   . History of kidney stones   . Hypothyroidism   . Mental disorder   . Neuromuscular disorder ([place])   . PONV (postoperative nausea and vomiting)   . Sleep apnea    Uses CPAP  . Uncontrolled type 2 diabetes mellitus with hyperglycemia, with long-term current use of insulin ([place]) 06/08/2016    Past Surgical History:  Procedure Laterality Date  . ANKLE SURGERY Right    fusion  . APPENDECTOMY    . CHOLECYSTECTOMY    . EXPLORATORY LAPAROTOMY    . HEMORRHOID SURGERY    . MASTECTOMY MODIFIED RADICAL Left 04/14/2016   Procedure: LEFT MODIFIED RADICAL MASTECTOMY;  Surgeon: Aviva Signs, MD;  Location: [facility];  Service: General;  Laterality: Left;  . PORTACATH PLACEMENT N/A 05/26/2016   Procedure: INSERTION PORT-A-CATH RIGHT SUBCLAVIAN AND  DRAINAGE OF SEROMA LEFT CHEST;  Surgeon: Aviva Signs, MD;  Location: [facility];  Service: General;  Laterality: N/A;  . ROTATOR CUFF REPAIR    . TONSILLECTOMY    . TUBAL LIGATION      Family History  Problem Relation Age of Onset  . Heart disease Mother   . Bipolar disorder Father   .  Heart disease Father   . Prostate cancer Brother     Social history:  reports that she quit smoking about 2 years ago. Her smoking use included Cigarettes. She has a 50.00 pack-year smoking history. She has never used smokeless tobacco. She reports that she does not drink alcohol or use drugs.  Medications:  Prior to Admission medications   Medication Sig Start Date End Date Taking? Authorizing Provider  allopurinol (ZYLOPRIM) 100 MG tablet Take 100 mg by mouth daily.   Yes Historical Provider, MD  aspirin EC  81 MG tablet Take 81 mg by mouth daily.   Yes Historical Provider, MD  chlorproMAZINE (THORAZINE) 50 MG tablet Take 100 mg by mouth at bedtime.    Yes Historical Provider, MD  CYCLOPHOSPHAMIDE IV Inject into the vein. Every 3 weeks   Yes Historical Provider, MD  dexamethasone (DECADRON) 4 MG tablet Take 2 tablets (8 mg total) by mouth 2 (two) times daily. Start the day before Taxotere. Then again the day after chemo for 3 days. 05/17/16  Yes Twana First, MD  DOCEtaxel (TAXOTERE IV) Inject into the vein. Every 3 weeks   Yes Historical Provider, MD  HYDROcodone-acetaminophen (NORCO) 5-325 MG tablet Take 1 tablet by mouth every 6 (six) hours as needed for moderate pain. 04/16/16  Yes Aviva Signs, MD  insulin degludec (TRESIBA FLEXTOUCH) 100 UNIT/ML SOPN FlexTouch Pen Inject 36 Units into the skin at bedtime.    Yes Historical Provider, MD  levofloxacin (LEVAQUIN) 750 MG tablet  06/14/16  Yes Historical Provider, MD  levothyroxine (SYNTHROID, LEVOTHROID) 50 MCG tablet Take 50 mcg by mouth daily before breakfast.   Yes Historical Provider, MD  lidocaine-prilocaine (EMLA) cream Apply to affected area once Patient taking differently: Apply 1 application topically as directed. Apply to affected area once 05/17/16  Yes Twana First, MD  LORazepam (ATIVAN) 0.5 MG tablet Take 1 tablet (0.5 mg total) by mouth every 6 (six) hours as needed (Nausea or vomiting). 05/17/16  Yes Twana First, MD  mirtazapine (REMERON) 15 MG tablet Take 15 mg by mouth at bedtime.   Yes Historical Provider, MD  NOVOLOG MIX 70/30 FLEXPEN (70-30) 100 UNIT/ML FlexPen Inject 30 Units into the skin 2 (two) times daily with a meal.  06/08/16  Yes Historical Provider, MD  omeprazole (PRILOSEC) 40 MG capsule Take 40 mg by mouth daily.   Yes Historical Provider, MD  ondansetron (ZOFRAN) 8 MG tablet Take 1 tablet (8 mg total) by mouth 2 (two) times daily as needed for refractory nausea / vomiting. Start on day 3 after chemo. 05/17/16  Yes Twana First, MD   Pegfilgrastim (NEULASTA ONPRO [place]) Inject into the skin. Every 21 days   Yes Historical Provider, MD  polyethylene glycol powder (GLYCOLAX/MIRALAX) powder Take 17 g by mouth daily.  04/03/16  Yes Historical Provider, MD  prochlorperazine (COMPAZINE) 10 MG tablet Take 1 tablet (10 mg total) by mouth every 6 (six) hours as needed (Nausea or vomiting). 05/17/16  Yes Twana First, MD  simvastatin (ZOCOR) 40 MG tablet Take 40 mg by mouth at bedtime.   Yes Historical Provider, MD  temazepam (RESTORIL) 15 MG capsule Take 15 mg by mouth at bedtime.   Yes Historical Provider, MD  tiZANidine (ZANAFLEX) 4 MG tablet Take 8 mg by mouth 2 (two) times daily.   Yes Historical Provider, MD      Allergies  Allergen Reactions  . Keflex [Cephalexin] Anaphylaxis    Pt "codes" on this medication.  . Sulfa Antibiotics Hives  .  Latex Rash    ROS:  Out of a complete 14 system review of symptoms, the patient complains only of the following symptoms, and all other reviewed systems are negative.  Incontinence of the bladder, constipation Memory loss, confusion, tremor Depression, anxiety, too much sleep, decreased energy, disinterest in activities  Blood pressure 99/63, pulse 60, height 5\' 4"  (1.626 m), weight 195 lb (88.5 kg).  Physical Exam  General: The patient is alert and cooperative at the time of the examination. The patient is moderately obese.  Eyes: Pupils are equal, round, and reactive to light. Discs are flat bilaterally.  Neck: The neck is supple, no carotid bruits are noted.  Respiratory: The respiratory examination is clear.  Cardiovascular: The cardiovascular examination reveals a regular rate and rhythm, no obvious murmurs or rubs are noted.  Skin: Extremities are without significant edema.  Neurologic Exam  Mental status: The patient is alert and oriented x 3 at the time of the examination. The patient has apparent normal recent and remote memory, with an apparently normal attention  span and concentration ability.  Cranial nerves: Facial symmetry is present. There is good sensation of the face to pinprick and soft touch bilaterally. The strength of the facial muscles and the muscles to head turning and shoulder shrug are normal bilaterally. Speech is well enunciated, no aphasia or dysarthria is noted. Extraocular movements are full. Visual fields are full. The tongue is midline, and the patient has symmetric elevation of the soft palate. No obvious hearing deficits are noted. Masking of the face is seen.  Motor: The motor testing reveals 5 over 5 strength of all 4 extremities. Good symmetric motor tone is noted throughout.  Sensory: Sensory testing is intact to pinprick, soft touch, vibration sensation, and position sense on all 4 extremities. No definite stocking pattern pinprick sensory deficit was noted in the lower extremities. No evidence of extinction is noted.  Coordination: Cerebellar testing reveals good finger-nose-finger and heel-to-shin bilaterally. Resting tremors are seen in both upper extremity is, right greater than left.  Gait and station: The patient is able to arise from a seated position with arms crossed with some difficulty.  Once up she can walk independently, prominent tremors are seen with both upper extremities, decreased arm swing. Tandem gait is slightly unsteady. Romberg is negative. No drift is seen.  Reflexes: Deep tendon reflexes are symmetric, but are depressed  bilaterally. Toes are downgoing bilaterally.   Assessment/Plan:    1. Secondary parkinsonism  2. Restless leg syndrome  3. Memory disorder  4. Schizophrenia  The patient has been on Thorazine for least 2 years, she has developed secondary parkinsonism on this medication. The patient will require follow-up through a psychiatrist, a referral will be made. The patient will be placed on low-dose Sinemet for the parkinsonism and for the restless leg syndrome. She will begin Sinemet  25/100 mg tablets taking one half tablet in the morning and midday, one tablet at night. The family will need to watch out for increased confusion and hallucinations. She will follow-up in 4 months.   Jill Alexanders MD 06/29/2016 8:29 AM  [facility] [address] [place], [place] [address]  Phone [phone] Fax [phone]

## 2016-06-30 ENCOUNTER — Encounter (HOSPITAL_BASED_OUTPATIENT_CLINIC_OR_DEPARTMENT_OTHER): Payer: PPO

## 2016-06-30 VITALS — BP 133/95 | HR 96 | Temp 98.2°F | Resp 18

## 2016-06-30 DIAGNOSIS — Z5189 Encounter for other specified aftercare: Secondary | ICD-10-CM | POA: Diagnosis not present

## 2016-06-30 DIAGNOSIS — C773 Secondary and unspecified malignant neoplasm of axilla and upper limb lymph nodes: Secondary | ICD-10-CM

## 2016-06-30 DIAGNOSIS — C50212 Malignant neoplasm of upper-inner quadrant of left female breast: Secondary | ICD-10-CM

## 2016-06-30 DIAGNOSIS — C50912 Malignant neoplasm of unspecified site of left female breast: Secondary | ICD-10-CM

## 2016-06-30 MED ORDER — PEGFILGRASTIM INJECTION 6 MG/0.6ML ~~LOC~~
6.0000 mg | PREFILLED_SYRINGE | Freq: Once | SUBCUTANEOUS | Status: AC
  Administered 2016-06-30: 6 mg via SUBCUTANEOUS

## 2016-06-30 NOTE — Patient Instructions (Signed)
[facility] Discharge Instructions  RECOMMENDATIONS MADE BY THE CONSULTANT AND ANY TEST RESULTS WILL BE SENT TO YOUR REFERRING PHYSICIAN.  Neulasta injection today. Return as scheduled.  Thank you for choosing  [facility] to provide your oncology and hematology care.  To afford each patient quality time with our provider, please arrive at least 15 minutes before your scheduled appointment time.    If you have a lab appointment with the Cancer Center please come in thru the  Main Entrance and check in at the main information desk  You need to re-schedule your appointment should you arrive 10 or more minutes late.  We strive to give you quality time with our providers, and arriving late affects you and other patients whose appointments are after yours.  Also, if you no show three or more times for appointments you may be dismissed from the clinic at the providers discretion.     Again, thank you for choosing [facility].  Our hope is that these requests will decrease the amount of time that you wait before being seen by our physicians.       _____________________________________________________________  Should you have questions after your visit to [facility], please contact our office at [phone] between the hours of 8:30 a.m. and 4:30 p.m.  Voicemails left after 4:30 p.m. will not be returned until the following business day.  For prescription refill requests, have your pharmacy contact our office.       Resources For Cancer Patients and their Caregivers ? American Cancer Society: Can assist with transportation, wigs, general needs, runs Look Good Feel Better.        1-888-227-6333 ? Cancer Care: Provides financial assistance, online support groups, medication/co-pay assistance.  1-800-813-HOPE (4673) ? [facility] Assists [place] cancer patients and  their families through emotional , educational and financial support.  336-427-4357 ? [place] DSS Where to apply for food stamps, Medicaid and utility assistance. 336-342-1394 ? [facility]: Transportation to medical appointments. 336-347-2287 ? Social Security Administration: May apply for disability if have a Stage IV cancer. 336-342-7796 1-800-772-1213 ? [place] Aging, Disability and Transit Services: Assists with nutrition, care and transit needs. 336-349-2343  Cancer Center Support Programs: [email or web address]@ > Cancer Support Group  2nd Tuesday of the month 1pm-2pm, Journey Room  > Creative Journey  3rd Tuesday of the month 1130am-1pm, Journey Room  > Look Good Feel Better  1st Wednesday of the month 10am-12 noon, Journey Room (Call American Cancer Society to register 1-800-395-5775)   

## 2016-07-01 ENCOUNTER — Telehealth (HOSPITAL_COMMUNITY): Payer: Self-pay | Admitting: *Deleted

## 2016-07-01 NOTE — Telephone Encounter (Signed)
left voice message regarding an appointment. 

## 2016-07-02 DIAGNOSIS — Z09 Encounter for follow-up examination after completed treatment for conditions other than malignant neoplasm: Secondary | ICD-10-CM | POA: Diagnosis not present

## 2016-07-02 DIAGNOSIS — R251 Tremor, unspecified: Secondary | ICD-10-CM | POA: Diagnosis not present

## 2016-07-02 DIAGNOSIS — R509 Fever, unspecified: Secondary | ICD-10-CM | POA: Diagnosis not present

## 2016-07-02 DIAGNOSIS — E1165 Type 2 diabetes mellitus with hyperglycemia: Secondary | ICD-10-CM | POA: Diagnosis not present

## 2016-07-02 DIAGNOSIS — Z6832 Body mass index (BMI) 32.0-32.9, adult: Secondary | ICD-10-CM | POA: Diagnosis not present

## 2016-07-19 ENCOUNTER — Encounter (HOSPITAL_COMMUNITY): Payer: Self-pay

## 2016-07-19 ENCOUNTER — Encounter (HOSPITAL_BASED_OUTPATIENT_CLINIC_OR_DEPARTMENT_OTHER): Payer: PPO | Admitting: Oncology

## 2016-07-19 ENCOUNTER — Encounter (HOSPITAL_COMMUNITY): Payer: PPO | Attending: Oncology

## 2016-07-19 VITALS — BP 157/90 | HR 91 | Temp 97.6°F | Resp 18 | Wt 190.1 lb

## 2016-07-19 DIAGNOSIS — E039 Hypothyroidism, unspecified: Secondary | ICD-10-CM | POA: Insufficient documentation

## 2016-07-19 DIAGNOSIS — Z9012 Acquired absence of left breast and nipple: Secondary | ICD-10-CM | POA: Insufficient documentation

## 2016-07-19 DIAGNOSIS — Z87891 Personal history of nicotine dependence: Secondary | ICD-10-CM | POA: Diagnosis not present

## 2016-07-19 DIAGNOSIS — Z9889 Other specified postprocedural states: Secondary | ICD-10-CM | POA: Diagnosis not present

## 2016-07-19 DIAGNOSIS — Z5111 Encounter for antineoplastic chemotherapy: Secondary | ICD-10-CM | POA: Diagnosis not present

## 2016-07-19 DIAGNOSIS — C773 Secondary and unspecified malignant neoplasm of axilla and upper limb lymph nodes: Secondary | ICD-10-CM | POA: Diagnosis not present

## 2016-07-19 DIAGNOSIS — E119 Type 2 diabetes mellitus without complications: Secondary | ICD-10-CM | POA: Diagnosis not present

## 2016-07-19 DIAGNOSIS — Z170 Estrogen receptor positive status [ER+]: Secondary | ICD-10-CM | POA: Diagnosis not present

## 2016-07-19 DIAGNOSIS — F419 Anxiety disorder, unspecified: Secondary | ICD-10-CM | POA: Diagnosis not present

## 2016-07-19 DIAGNOSIS — Z9049 Acquired absence of other specified parts of digestive tract: Secondary | ICD-10-CM | POA: Diagnosis not present

## 2016-07-19 DIAGNOSIS — J449 Chronic obstructive pulmonary disease, unspecified: Secondary | ICD-10-CM | POA: Diagnosis not present

## 2016-07-19 DIAGNOSIS — C50912 Malignant neoplasm of unspecified site of left female breast: Secondary | ICD-10-CM

## 2016-07-19 DIAGNOSIS — Z888 Allergy status to other drugs, medicaments and biological substances status: Secondary | ICD-10-CM | POA: Insufficient documentation

## 2016-07-19 DIAGNOSIS — Z794 Long term (current) use of insulin: Secondary | ICD-10-CM | POA: Insufficient documentation

## 2016-07-19 DIAGNOSIS — Z79899 Other long term (current) drug therapy: Secondary | ICD-10-CM | POA: Insufficient documentation

## 2016-07-19 DIAGNOSIS — G4730 Sleep apnea, unspecified: Secondary | ICD-10-CM | POA: Insufficient documentation

## 2016-07-19 DIAGNOSIS — C50212 Malignant neoplasm of upper-inner quadrant of left female breast: Secondary | ICD-10-CM

## 2016-07-19 LAB — CBC WITH DIFFERENTIAL/PLATELET
BASOS ABS: 0 10*3/uL (ref 0.0–0.1)
BASOS PCT: 0 %
EOS ABS: 0 10*3/uL (ref 0.0–0.7)
EOS PCT: 0 %
HCT: 34.4 % — ABNORMAL LOW (ref 36.0–46.0)
Hemoglobin: 11.2 g/dL — ABNORMAL LOW (ref 12.0–15.0)
LYMPHS PCT: 12 %
Lymphs Abs: 1.8 10*3/uL (ref 0.7–4.0)
MCH: 28 pg (ref 26.0–34.0)
MCHC: 32.6 g/dL (ref 30.0–36.0)
MCV: 86 fL (ref 78.0–100.0)
MONO ABS: 0.5 10*3/uL (ref 0.1–1.0)
Monocytes Relative: 3 %
Neutro Abs: 13 10*3/uL — ABNORMAL HIGH (ref 1.7–7.7)
Neutrophils Relative %: 85 %
PLATELETS: 337 10*3/uL (ref 150–400)
RBC: 4 MIL/uL (ref 3.87–5.11)
RDW: 16.1 % — AB (ref 11.5–15.5)
WBC: 15.3 10*3/uL — AB (ref 4.0–10.5)

## 2016-07-19 LAB — COMPREHENSIVE METABOLIC PANEL
ALT: 22 U/L (ref 14–54)
AST: 32 U/L (ref 15–41)
Albumin: 3.5 g/dL (ref 3.5–5.0)
Alkaline Phosphatase: 88 U/L (ref 38–126)
Anion gap: 14 (ref 5–15)
BUN: 20 mg/dL (ref 6–20)
CHLORIDE: 102 mmol/L (ref 101–111)
CO2: 21 mmol/L — AB (ref 22–32)
Calcium: 9.3 mg/dL (ref 8.9–10.3)
Creatinine, Ser: 1.11 mg/dL — ABNORMAL HIGH (ref 0.44–1.00)
GFR calc Af Amer: 58 mL/min — ABNORMAL LOW (ref >60)
GFR, EST NON AFRICAN AMERICAN: 50 mL/min — AB (ref >60)
Glucose, Bld: 430 mg/dL — ABNORMAL HIGH (ref 65–99)
Potassium: 3.9 mmol/L (ref 3.5–5.1)
SODIUM: 137 mmol/L (ref 135–145)
Total Bilirubin: 0.2 mg/dL — ABNORMAL LOW (ref 0.3–1.2)
Total Protein: 7.1 g/dL (ref 6.5–8.1)

## 2016-07-19 MED ORDER — PALONOSETRON HCL INJECTION 0.25 MG/5ML
0.2500 mg | Freq: Once | INTRAVENOUS | Status: AC
  Administered 2016-07-19: 0.25 mg via INTRAVENOUS
  Filled 2016-07-19: qty 5

## 2016-07-19 MED ORDER — DOCETAXEL CHEMO INJECTION 160 MG/16ML
75.0000 mg/m2 | Freq: Once | INTRAVENOUS | Status: AC
  Administered 2016-07-19: 150 mg via INTRAVENOUS
  Filled 2016-07-19: qty 15

## 2016-07-19 MED ORDER — DEXAMETHASONE SODIUM PHOSPHATE 10 MG/ML IJ SOLN
10.0000 mg | Freq: Once | INTRAMUSCULAR | Status: AC
Start: 2016-07-19 — End: 2016-07-19
  Administered 2016-07-19: 10 mg via INTRAVENOUS
  Filled 2016-07-19: qty 1

## 2016-07-19 MED ORDER — SODIUM CHLORIDE 0.9 % IV SOLN
Freq: Once | INTRAVENOUS | Status: AC
  Administered 2016-07-19: 10:00:00 via INTRAVENOUS

## 2016-07-19 MED ORDER — HEPARIN SOD (PORK) LOCK FLUSH 100 UNIT/ML IV SOLN
500.0000 [IU] | Freq: Once | INTRAVENOUS | Status: AC | PRN
  Administered 2016-07-19: 500 [IU]
  Filled 2016-07-19: qty 5

## 2016-07-19 MED ORDER — CYCLOPHOSPHAMIDE CHEMO INJECTION 1 GM
600.0000 mg/m2 | Freq: Once | INTRAMUSCULAR | Status: AC
  Administered 2016-07-19: 1200 mg via INTRAVENOUS
  Filled 2016-07-19: qty 50

## 2016-07-19 MED ORDER — SODIUM CHLORIDE 0.9% FLUSH
10.0000 mL | INTRAVENOUS | Status: DC | PRN
  Administered 2016-07-19: 10 mL
  Filled 2016-07-19: qty 10

## 2016-07-19 NOTE — Progress Notes (Signed)
Margaret Squibb, MD [place] [address]   CURRENT THERAPY: TC beginning on 05/31/2016  INTERVAL HISTORY: Margaret Mcdaniel 66 y.o. female returns for followup of Stage IB (T2N1AM0) left breast cancer, ER/PR 100%, HER2 NEGATIVE, measuring 2.2 cm in greatest dimension and anterior margin broadly positive for invasive ductal carcinoma (upper inner quadrant) and 1/6 positive lymph nodes.    Breast cancer metastasized to axillary lymph node, left ([place])   04/14/2016 Initial Diagnosis    Breast cancer metastasized to axillary lymph node, left ([place])     04/14/2016 Surgery    Left modified radical mastectomy - INVASIVE GRADE 2 DUCTAL CARCINOMA, SPANNING 2.2 CM IN GREATEST DIMENSION. - ASSOCIATED INTERMEDIATE GRADE DUCTAL CARCINOMA IN SITU. - ANTERIOR MARGIN IS BROADLY POSITIVE FOR INVASIVE DUCTAL CARCINOMA IN AREA OF TUMOR (UPPER INNER QUADRANT). - OTHER MARGINS ARE NEGATIVE. - ONE OF SIX LYMPH NODES POSITIVE FOR METASTATIC DUCTAL CARCINOMA (1/6). - ER/PR positive, HER2 negative. - pT2 pN1a cM0      05/14/2016 Imaging    CT chest/abd/pelvis: IMPRESSION: 1. Left mastectomy changes with a seroma. Rounded regions of low attenuation in the axilla adjacent to surgical clips are favored to be postsurgical changes rather than mildly prominent lymph nodes. 2. The left adrenal gland is mildly thickened, likely due to hyperplasia. A tiny 6 mm nodule is likely a small adenoma. 3. Atherosclerotic changes in a non aneurysmal aorta. 4. Anterior hernia mesh is calcified. Fluid just deep to the hernia mesh is likely not acute and of doubtful acute significance.      05/26/2016 Procedure    Port-A-Cath insertion, needle aspiration of wound seroma by Dr. Arnoldo Morale      05/31/2016 -  Chemotherapy    The patient had palonosetron (ALOXI) injection 0.25 mg, 0.25 mg, Intravenous,  Once, 1 of 4 cycles  pegfilgrastim (NEULASTA) injection 6 mg, 6 mg, Subcutaneous,  Once, 1 of 4  cycles  cyclophosphamide (CYTOXAN) 1,200 mg in sodium chloride 0.9 % 250 mL chemo infusion, 600 mg/m2 = 1,200 mg, Intravenous,  Once, 1 of 4 cycles  DOCEtaxel (TAXOTERE) 150 mg in dextrose 5 % 250 mL chemo infusion, 75 mg/m2 = 150 mg, Intravenous,  Once, 1 of 4 cycles  for chemotherapy treatment.        06/07/2016 - 06/14/2016 Hospital Admission    Admit date: 06/07/2016 Admission diagnosis: Pyelonephritis Additional comments: Treated with antibiotics      06/21/2016 Treatment Plan Change    Tx deferred x 1 week due to hospitalization      06/22/2016 - 06/23/2016 Hospital Admission    Admit date: 06/22/2016 Admission diagnosis: Fever Additional comments: Source not identified      Margaret Mcdaniel presents today for continuing follow up. She is scheduled for cycle 3 Breast TC q21d today.   She is tolerating treatment well.  She denies any nausea or vomiting.  Appetite is good.  She notes that she has lost 9 lbs overall. She notes that she has been running a fever everyday ever since she started chemotherapy. She reports the last fever was 101.3. She takes tylenol and it brings the fever down. Denies cough.    She denies any chest pain or abdominal pain.  She denies any diarrhea or constipation. Denies neuropathy. Denies any changes in appetite or weight loss.   Review of Systems  Constitutional: Positive for fever (resolved with tylenol). Negative for chills and weight loss.  HENT: Negative.   Eyes: Negative.  Respiratory: Negative.  Negative for cough.   Cardiovascular: Negative.  Negative for chest pain.  Gastrointestinal: Negative.  Negative for abdominal pain, constipation, diarrhea, nausea and vomiting.  Genitourinary: Negative.   Musculoskeletal: Negative.   Skin: Negative.   Neurological: Negative.  Negative for tingling and weakness.  Endo/Heme/Allergies: Negative.   Psychiatric/Behavioral: Negative.     Past Medical History:  Diagnosis Date  . Anxiety   . Arthritis   .  Breast cancer metastasized to axillary lymph node, left ([place]) 04/14/2016  . Cancer ([place])    left breast  . CKD (chronic kidney disease) stage 3, GFR 30-59 ml/min 06/08/2016  . COPD (chronic obstructive pulmonary disease) ([place])   . Diabetes mellitus without complication ([place])   . GERD (gastroesophageal reflux disease)   . History of kidney stones   . Hypothyroidism   . Mental disorder   . Neuromuscular disorder ([place])   . PONV (postoperative nausea and vomiting)   . RLS (restless legs syndrome) 06/29/2016  . Secondary Parkinson disease ([place]) 06/29/2016  . Sleep apnea    Uses CPAP  . Uncontrolled type 2 diabetes mellitus with hyperglycemia, with long-term current use of insulin ([place]) 06/08/2016    Past Surgical History:  Procedure Laterality Date  . ANKLE SURGERY Right    fusion  . APPENDECTOMY    . CHOLECYSTECTOMY    . EXPLORATORY LAPAROTOMY    . HEMORRHOID SURGERY    . MASTECTOMY MODIFIED RADICAL Left 04/14/2016   Procedure: LEFT MODIFIED RADICAL MASTECTOMY;  Surgeon: Aviva Signs, MD;  Location: [facility];  Service: General;  Laterality: Left;  . PORTACATH PLACEMENT N/A 05/26/2016   Procedure: INSERTION PORT-A-CATH RIGHT SUBCLAVIAN AND  DRAINAGE OF SEROMA LEFT CHEST;  Surgeon: Aviva Signs, MD;  Location: [facility];  Service: General;  Laterality: N/A;  . ROTATOR CUFF REPAIR    . TONSILLECTOMY    . TUBAL LIGATION      Family History  Problem Relation Age of Onset  . Heart disease Mother   . Bipolar disorder Father   . Heart disease Father   . Prostate cancer Brother     Social History   Social History  . Marital status: Divorced    Spouse name: N/A  . Number of children: 2  . Years of education: 12   Occupational History  . Retired    Social History Main Topics  . Smoking status: Former Smoker    Packs/day: 1.00    Years: 50.00    Types: Cigarettes    Quit date: 04/12/2014  . Smokeless tobacco: Never Used  . Alcohol use No  . Drug use: No  . Sexual activity: Not Asked    Other Topics Concern  . None   Social History Narrative   Lives alone   Caffeine use: 2 cups coffee someitmes   Right- handed     PHYSICAL EXAMINATION  ECOG PERFORMANCE STATUS: 1 - Symptomatic but completely ambulatory  There were no vitals filed for this visit. There were no vitals filed for this visit.   Marland KitchenPhysical Exam  Constitutional: She is oriented to person, place, and time and well-developed, well-nourished, and in no distress.  HENT:  Head: Normocephalic and atraumatic.  Eyes: Conjunctivae and EOM are normal. Pupils are equal, round, and reactive to light.  Neck: Normal range of motion. Neck supple.  Cardiovascular: Normal rate, regular rhythm and normal heart sounds.   Pulmonary/Chest: Effort normal and breath sounds normal.  Abdominal: Soft. Bowel sounds are normal.  Musculoskeletal: Normal range of  motion.  Neurological: She is alert and oriented to person, place, and time. Gait normal.  Skin: Skin is warm and dry.  Nursing note and vitals reviewed.   LABORATORY DATA: CBC    Component Value Date/Time   WBC 15.3 (H) 07/19/2016 0849   RBC 4.00 07/19/2016 0849   HGB 11.2 (L) 07/19/2016 0849   HCT 34.4 (L) 07/19/2016 0849   PLT 337 07/19/2016 0849   MCV 86.0 07/19/2016 0849   MCH 28.0 07/19/2016 0849   MCHC 32.6 07/19/2016 0849   RDW 16.1 (H) 07/19/2016 0849   LYMPHSABS 1.8 07/19/2016 0849   MONOABS 0.5 07/19/2016 0849   EOSABS 0.0 07/19/2016 0849   BASOSABS 0.0 07/19/2016 0849      Chemistry      Component Value Date/Time   NA 137 07/19/2016 0849   K 3.9 07/19/2016 0849   CL 102 07/19/2016 0849   CO2 21 (L) 07/19/2016 0849   BUN 20 07/19/2016 0849   CREATININE 1.11 (H) 07/19/2016 0849      Component Value Date/Time   CALCIUM 9.3 07/19/2016 0849   ALKPHOS 88 07/19/2016 0849   AST 32 07/19/2016 0849   ALT 22 07/19/2016 0849   BILITOT 0.2 (L) 07/19/2016 0849        PENDING LABS:   RADIOGRAPHIC STUDIES: I have personally reviewed the  radiological images as listed and agreed with the findings in the report.  CT ABDOMEN AND PELVIS WITH CONTRAST 06/22/2016  IMPRESSION: 1. No perinephric fluid collections or abscess is visualized. 2. Suspect mild hepatic steatosis 3. Nonobstructing stones in the left kidney 4. Stable peripherally calcified probable hernia mesh with fluid density in the anterior abdominal wall. 5. Right lateral abdominal wall hernia containing a small amount of colon. No obstruction. 6. Mesh material in the coccygeal region.  Possible rectal prolapse  2D DIGITAL DIAGNOSTIC BILATERAL MAMMOGRAM WITH CAD AND ADJUNCT TOMO 07/05/2016  IMPRESSION: Highly suspicious mass in the left breast at 10 o'clock, 10 cm from the nipple. Abnormal left axillary lymph node.      PATHOLOGY:     ASSESSMENT AND PLAN:  Stage IB (T2N1AM0) left breast cancer, ER/PR 100%, HER2 NEGATIVE, measuring 2.2 cm in greatest dimension and anterior margin broadly positive for invasive ductal carcinoma (upper inner quadrant) and 1/6 positive lymph nodes.  -Proceed with cycle 3 of TC today as planned. Tolerating treatment well.   - RTC in 3 weeks for her cycle 4 of chemotherapy. Then will make a referral for adjuvant RT.  THERAPY PLAN:  TC x 4 cycles followed by adjuvant XRT.  Following completion of XRT, she will be placed on an aromatase inhibitor for 5 years.    All questions were answered. The patient knows to call the clinic with any problems, questions or concerns. We can certainly see the patient much sooner if necessary.  This document serves as a record of services personally performed by Twana First, MD. It was created on her behalf by Shirlean Mylar, a trained medical scribe. The creation of this record is based on the scribe's personal observations and the provider's statements to them. This document has been checked and approved by the attending provider.  I have reviewed the above documentation for accuracy and  completeness and I agree with the above.  This note is electronically signed by: Mikey College 07/19/2016 9:38 AM

## 2016-07-19 NOTE — Progress Notes (Signed)
1000 Labs reviewed with Dr. Talbert Cage and pt was approved for chemo tx today.No new orders obtained from MD         [name] tolerated chemo tx well without complaints or incident. VSS upon discharge. Pt discharged self ambulatory in satisfactory condition accompanied by family member

## 2016-07-19 NOTE — Patient Instructions (Signed)
[facility] Discharge Instructions for Patients Receiving Chemotherapy   Beginning January 23rd 2017 lab work for the Cancer Center will be done in the  Main lab at [facility] on 1st floor. If you have a lab appointment with the Cancer Center please come in thru the  Main Entrance and check in at the main information desk   Today you received the following chemotherapy agents Taxotere and Cytoxan. Follow-up as scheduled. Call clinic for any questions or concerns  To help prevent nausea and vomiting after your treatment, we encourage you to take your nausea medication   If you develop nausea and vomiting, or diarrhea that is not controlled by your medication, call the clinic.  The clinic phone number is [phone]. Office hours are Monday-Friday 8:30am-5:00pm.  BELOW ARE SYMPTOMS THAT SHOULD BE REPORTED IMMEDIATELY:  *FEVER GREATER THAN 101.0 F  *CHILLS WITH OR WITHOUT FEVER  NAUSEA AND VOMITING THAT IS NOT CONTROLLED WITH YOUR NAUSEA MEDICATION  *UNUSUAL SHORTNESS OF BREATH  *UNUSUAL BRUISING OR BLEEDING  TENDERNESS IN MOUTH AND THROAT WITH OR WITHOUT PRESENCE OF ULCERS  *URINARY PROBLEMS  *BOWEL PROBLEMS  UNUSUAL RASH Items with * indicate a potential emergency and should be followed up as soon as possible. If you have an emergency after office hours please contact your primary care physician or go to the nearest emergency department.  Please call the clinic during office hours if you have any questions or concerns.   You may also contact the Patient Navigator at [phone] should you have any questions or need assistance in obtaining follow up care.      Resources For Cancer Patients and their Caregivers ? American Cancer Society: Can assist with transportation, wigs, general needs, runs Look Good Feel Better.        1-888-227-6333 ? Cancer Care: Provides financial assistance, online support groups, medication/co-pay assistance.   1-800-813-HOPE (4673) ? [facility] Assists [place] cancer patients and their families through emotional , educational and financial support.  336-427-4357 ? [place] DSS Where to apply for food stamps, Medicaid and utility assistance. 336-342-1394 ? RCATS: Transportation to medical appointments. 336-347-2287 ? Social Security Administration: May apply for disability if have a Stage IV cancer. 336-342-7796 1-800-772-1213 ? [place] Aging, Disability and Transit Services: Assists with nutrition, care and transit needs. 336-349-2343         

## 2016-07-19 NOTE — Patient Instructions (Addendum)
[place] at [facility] Discharge Instructions  RECOMMENDATIONS MADE BY THE CONSULTANT AND ANY TEST RESULTS WILL BE SENT TO YOUR REFERRING PHYSICIAN.  You were seen today by Dr. Twana First Follow up in 3 weeks for next treatment.   Thank you for choosing [place] at [facility] to provide your oncology and hematology care.  To afford each patient quality time with our provider, please arrive at least 15 minutes before your scheduled appointment time.    If you have a lab appointment with the [facility] please come in thru the  Main Entrance and check in at the main information desk  You need to re-schedule your appointment should you arrive 10 or more minutes late.  We strive to give you quality time with our providers, and arriving late affects you and other patients whose appointments are after yours.  Also, if you no show three or more times for appointments you may be dismissed from the clinic at the providers discretion.     Again, thank you for choosing [facility].  Our hope is that these requests will decrease the amount of time that you wait before being seen by our physicians.       _____________________________________________________________  Should you have questions after your visit to [facility], please contact our office at [phone] between the hours of 8:30 a.m. and 4:30 p.m.  Voicemails left after 4:30 p.m. will not be returned until the following business day.  For prescription refill requests, have your pharmacy contact our office.       Resources For Cancer Patients and their Caregivers ? American Cancer Society: Can assist with transportation, wigs, general needs, runs Look Good Feel Better.        323-504-8987 ? Cancer Care: Provides financial assistance, online support groups, medication/co-pay assistance.  1-800-813-HOPE 631 725 8614) ? [place] Assists  [place] cancer patients and their families through emotional , educational and financial support.  (365)263-0372 ? [place] DSS Where to apply for food stamps, Medicaid and utility assistance. 573-386-9517 ? RCATS: Transportation to medical appointments. (856) 874-5062 ? Social Security Administration: May apply for disability if have a Stage IV cancer. (737)451-4940 705-646-0907 ? [employer], Disability and Transit Services: Assists with nutrition, care and transit needs. [facility] Support Programs: @10RELATIVEDAYS @ > Cancer Support Group  2nd Tuesday of the month 1pm-2pm, Journey Room  > Creative Journey  3rd Tuesday of the month 1130am-1pm, Journey Room  > Look Good Feel Better  1st Wednesday of the month 10am-12 noon, Journey Room (Call [place] to register [phone])

## 2016-07-21 ENCOUNTER — Encounter (HOSPITAL_BASED_OUTPATIENT_CLINIC_OR_DEPARTMENT_OTHER): Payer: PPO

## 2016-07-21 ENCOUNTER — Encounter (HOSPITAL_COMMUNITY): Payer: Self-pay

## 2016-07-21 VITALS — BP 111/63 | HR 92 | Temp 97.9°F | Resp 18

## 2016-07-21 DIAGNOSIS — C50212 Malignant neoplasm of upper-inner quadrant of left female breast: Secondary | ICD-10-CM | POA: Diagnosis not present

## 2016-07-21 DIAGNOSIS — C50912 Malignant neoplasm of unspecified site of left female breast: Secondary | ICD-10-CM

## 2016-07-21 DIAGNOSIS — C773 Secondary and unspecified malignant neoplasm of axilla and upper limb lymph nodes: Secondary | ICD-10-CM | POA: Diagnosis not present

## 2016-07-21 DIAGNOSIS — Z5189 Encounter for other specified aftercare: Secondary | ICD-10-CM | POA: Diagnosis not present

## 2016-07-21 MED ORDER — PEGFILGRASTIM INJECTION 6 MG/0.6ML ~~LOC~~
6.0000 mg | PREFILLED_SYRINGE | Freq: Once | SUBCUTANEOUS | Status: AC
  Administered 2016-07-21: 6 mg via SUBCUTANEOUS
  Filled 2016-07-21: qty 0.6

## 2016-07-21 NOTE — Progress Notes (Signed)
Margaret Mcdaniel tolerated Neulasta injection well without complaints or incident. VSS Pt discharged self ambulatory in satisfactory condition accompanied by her son

## 2016-07-21 NOTE — Patient Instructions (Signed)
[facility] at [facility] Discharge Instructions  RECOMMENDATIONS MADE BY THE CONSULTANT AND ANY TEST RESULTS WILL BE SENT TO YOUR REFERRING PHYSICIAN.  Received Neulasta injection today. Follow-up as scheduled. Call clinic for any questions or concerns  Thank you for choosing [facility] at [facility] to provide your oncology and hematology care.  To afford each patient quality time with our provider, please arrive at least 15 minutes before your scheduled appointment time.    If you have a lab appointment with the Cancer Center please come in thru the  Main Entrance and check in at the main information desk  You need to re-schedule your appointment should you arrive 10 or more minutes late.  We strive to give you quality time with our providers, and arriving late affects you and other patients whose appointments are after yours.  Also, if you no show three or more times for appointments you may be dismissed from the clinic at the providers discretion.     Again, thank you for choosing [facility].  Our hope is that these requests will decrease the amount of time that you wait before being seen by our physicians.       _____________________________________________________________  Should you have questions after your visit to [facility], please contact our office at [phone] between the hours of 8:30 a.m. and 4:30 p.m.  Voicemails left after 4:30 p.m. will not be returned until the following business day.  For prescription refill requests, have your pharmacy contact our office.       Resources For Cancer Patients and their Caregivers ? American Cancer Society: Can assist with transportation, wigs, general needs, runs Look Good Feel Better.        1-888-227-6333 ? Cancer Care: Provides financial assistance, online support groups, medication/co-pay assistance.  1-800-813-HOPE (4673) ? [facility] Assists [place] cancer patients and their families through emotional , educational and financial support.  336-427-4357 ? [place] DSS Where to apply for food stamps, Medicaid and utility assistance. 336-342-1394 ? RCATS: Transportation to medical appointments. 336-347-2287 ? Social Security Administration: May apply for disability if have a Stage IV cancer. 336-342-7796 1-800-772-1213 ? [place] Aging, Disability and Transit Services: Assists with nutrition, care and transit needs. 336-349-2343  Cancer Center Support Programs: @10RELATIVEDAYS@ > Cancer Support Group  2nd Tuesday of the month 1pm-2pm, Journey Room  > Creative Journey  3rd Tuesday of the month 1130am-1pm, Journey Room  > Look Good Feel Better  1st Wednesday of the month 10am-12 noon, Journey Room (Call American Cancer [employer] to register 1-800-395-5775)   

## 2016-07-28 DIAGNOSIS — J449 Chronic obstructive pulmonary disease, unspecified: Secondary | ICD-10-CM | POA: Diagnosis not present

## 2016-07-28 DIAGNOSIS — E109 Type 1 diabetes mellitus without complications: Secondary | ICD-10-CM | POA: Diagnosis not present

## 2016-08-07 DIAGNOSIS — Z87891 Personal history of nicotine dependence: Secondary | ICD-10-CM | POA: Diagnosis not present

## 2016-08-07 DIAGNOSIS — R260 Ataxic gait: Secondary | ICD-10-CM | POA: Diagnosis not present

## 2016-08-07 DIAGNOSIS — Z170 Estrogen receptor positive status [ER+]: Secondary | ICD-10-CM | POA: Diagnosis not present

## 2016-08-07 DIAGNOSIS — Z9012 Acquired absence of left breast and nipple: Secondary | ICD-10-CM | POA: Diagnosis not present

## 2016-08-07 DIAGNOSIS — G20 Parkinson's disease: Secondary | ICD-10-CM | POA: Diagnosis not present

## 2016-08-07 DIAGNOSIS — J449 Chronic obstructive pulmonary disease, unspecified: Secondary | ICD-10-CM | POA: Diagnosis not present

## 2016-08-07 DIAGNOSIS — E1165 Type 2 diabetes mellitus with hyperglycemia: Secondary | ICD-10-CM | POA: Diagnosis not present

## 2016-08-07 DIAGNOSIS — C50212 Malignant neoplasm of upper-inner quadrant of left female breast: Secondary | ICD-10-CM | POA: Diagnosis not present

## 2016-08-07 DIAGNOSIS — M6281 Muscle weakness (generalized): Secondary | ICD-10-CM | POA: Diagnosis not present

## 2016-08-07 DIAGNOSIS — C50612 Malignant neoplasm of axillary tail of left female breast: Secondary | ICD-10-CM | POA: Diagnosis not present

## 2016-08-09 ENCOUNTER — Encounter (HOSPITAL_COMMUNITY): Payer: PPO | Attending: Oncology

## 2016-08-09 ENCOUNTER — Encounter (HOSPITAL_COMMUNITY): Payer: Self-pay | Admitting: Oncology

## 2016-08-09 ENCOUNTER — Encounter (HOSPITAL_BASED_OUTPATIENT_CLINIC_OR_DEPARTMENT_OTHER): Payer: PPO | Admitting: Oncology

## 2016-08-09 VITALS — BP 148/77 | HR 102 | Temp 98.0°F | Resp 18 | Wt 185.4 lb

## 2016-08-09 DIAGNOSIS — Z87891 Personal history of nicotine dependence: Secondary | ICD-10-CM | POA: Insufficient documentation

## 2016-08-09 DIAGNOSIS — Z79899 Other long term (current) drug therapy: Secondary | ICD-10-CM | POA: Diagnosis not present

## 2016-08-09 DIAGNOSIS — Z794 Long term (current) use of insulin: Secondary | ICD-10-CM | POA: Diagnosis not present

## 2016-08-09 DIAGNOSIS — J449 Chronic obstructive pulmonary disease, unspecified: Secondary | ICD-10-CM | POA: Insufficient documentation

## 2016-08-09 DIAGNOSIS — Z170 Estrogen receptor positive status [ER+]: Secondary | ICD-10-CM | POA: Insufficient documentation

## 2016-08-09 DIAGNOSIS — Z5111 Encounter for antineoplastic chemotherapy: Secondary | ICD-10-CM | POA: Diagnosis not present

## 2016-08-09 DIAGNOSIS — C773 Secondary and unspecified malignant neoplasm of axilla and upper limb lymph nodes: Secondary | ICD-10-CM | POA: Diagnosis not present

## 2016-08-09 DIAGNOSIS — C50912 Malignant neoplasm of unspecified site of left female breast: Secondary | ICD-10-CM

## 2016-08-09 DIAGNOSIS — Z9012 Acquired absence of left breast and nipple: Secondary | ICD-10-CM | POA: Diagnosis not present

## 2016-08-09 DIAGNOSIS — Z9049 Acquired absence of other specified parts of digestive tract: Secondary | ICD-10-CM | POA: Diagnosis not present

## 2016-08-09 DIAGNOSIS — Z9889 Other specified postprocedural states: Secondary | ICD-10-CM | POA: Diagnosis not present

## 2016-08-09 DIAGNOSIS — G4730 Sleep apnea, unspecified: Secondary | ICD-10-CM | POA: Diagnosis not present

## 2016-08-09 DIAGNOSIS — E039 Hypothyroidism, unspecified: Secondary | ICD-10-CM | POA: Diagnosis not present

## 2016-08-09 DIAGNOSIS — Z888 Allergy status to other drugs, medicaments and biological substances status: Secondary | ICD-10-CM | POA: Insufficient documentation

## 2016-08-09 DIAGNOSIS — E119 Type 2 diabetes mellitus without complications: Secondary | ICD-10-CM | POA: Diagnosis not present

## 2016-08-09 DIAGNOSIS — F419 Anxiety disorder, unspecified: Secondary | ICD-10-CM | POA: Insufficient documentation

## 2016-08-09 DIAGNOSIS — R739 Hyperglycemia, unspecified: Secondary | ICD-10-CM

## 2016-08-09 LAB — COMPREHENSIVE METABOLIC PANEL
ALBUMIN: 3.2 g/dL — AB (ref 3.5–5.0)
ALK PHOS: 73 U/L (ref 38–126)
ALT: 18 U/L (ref 14–54)
AST: 21 U/L (ref 15–41)
Anion gap: 13 (ref 5–15)
BILIRUBIN TOTAL: 0.1 mg/dL — AB (ref 0.3–1.2)
BUN: 30 mg/dL — AB (ref 6–20)
CALCIUM: 9.3 mg/dL (ref 8.9–10.3)
CO2: 20 mmol/L — ABNORMAL LOW (ref 22–32)
Chloride: 106 mmol/L (ref 101–111)
Creatinine, Ser: 1.32 mg/dL — ABNORMAL HIGH (ref 0.44–1.00)
GFR calc Af Amer: 47 mL/min — ABNORMAL LOW (ref >60)
GFR calc non Af Amer: 41 mL/min — ABNORMAL LOW (ref >60)
GLUCOSE: 474 mg/dL — AB (ref 65–99)
Potassium: 3.9 mmol/L (ref 3.5–5.1)
Sodium: 139 mmol/L (ref 135–145)
TOTAL PROTEIN: 7.5 g/dL (ref 6.5–8.1)

## 2016-08-09 LAB — CBC WITH DIFFERENTIAL/PLATELET
BASOS ABS: 0 10*3/uL (ref 0.0–0.1)
BASOS PCT: 0 %
Eosinophils Absolute: 0 10*3/uL (ref 0.0–0.7)
Eosinophils Relative: 0 %
HEMATOCRIT: 30.7 % — AB (ref 36.0–46.0)
Hemoglobin: 9.8 g/dL — ABNORMAL LOW (ref 12.0–15.0)
Lymphocytes Relative: 9 %
Lymphs Abs: 1.6 10*3/uL (ref 0.7–4.0)
MCH: 27.1 pg (ref 26.0–34.0)
MCHC: 31.9 g/dL (ref 30.0–36.0)
MCV: 84.8 fL (ref 78.0–100.0)
MONOS PCT: 6 %
Monocytes Absolute: 1.1 10*3/uL — ABNORMAL HIGH (ref 0.1–1.0)
NEUTROS ABS: 15.8 10*3/uL — AB (ref 1.7–7.7)
NEUTROS PCT: 85 %
Platelets: 445 10*3/uL — ABNORMAL HIGH (ref 150–400)
RBC: 3.62 MIL/uL — ABNORMAL LOW (ref 3.87–5.11)
RDW: 17.4 % — ABNORMAL HIGH (ref 11.5–15.5)
WBC: 18.6 10*3/uL — ABNORMAL HIGH (ref 4.0–10.5)

## 2016-08-09 MED ORDER — SODIUM CHLORIDE 0.9 % IV SOLN
Freq: Once | INTRAVENOUS | Status: AC
  Administered 2016-08-09: 10:00:00 via INTRAVENOUS

## 2016-08-09 MED ORDER — CYCLOPHOSPHAMIDE CHEMO INJECTION 1 GM
600.0000 mg/m2 | Freq: Once | INTRAMUSCULAR | Status: AC
  Administered 2016-08-09: 1200 mg via INTRAVENOUS
  Filled 2016-08-09: qty 50

## 2016-08-09 MED ORDER — DEXAMETHASONE SODIUM PHOSPHATE 10 MG/ML IJ SOLN
INTRAMUSCULAR | Status: AC
  Filled 2016-08-09: qty 1

## 2016-08-09 MED ORDER — HEPARIN SOD (PORK) LOCK FLUSH 100 UNIT/ML IV SOLN
500.0000 [IU] | Freq: Once | INTRAVENOUS | Status: AC | PRN
  Administered 2016-08-09: 500 [IU]
  Filled 2016-08-09 (×2): qty 5

## 2016-08-09 MED ORDER — INSULIN ASPART 100 UNIT/ML ~~LOC~~ SOLN
8.0000 [IU] | Freq: Once | SUBCUTANEOUS | Status: AC
  Administered 2016-08-09: 8 [IU] via SUBCUTANEOUS
  Filled 2016-08-09: qty 0.08

## 2016-08-09 MED ORDER — PALONOSETRON HCL INJECTION 0.25 MG/5ML
INTRAVENOUS | Status: AC
  Filled 2016-08-09: qty 5

## 2016-08-09 MED ORDER — DOCETAXEL CHEMO INJECTION 160 MG/16ML
75.0000 mg/m2 | Freq: Once | INTRAVENOUS | Status: AC
  Administered 2016-08-09: 150 mg via INTRAVENOUS
  Filled 2016-08-09: qty 15

## 2016-08-09 MED ORDER — PALONOSETRON HCL INJECTION 0.25 MG/5ML
0.2500 mg | Freq: Once | INTRAVENOUS | Status: AC
  Administered 2016-08-09: 0.25 mg via INTRAVENOUS

## 2016-08-09 MED ORDER — DEXAMETHASONE SODIUM PHOSPHATE 10 MG/ML IJ SOLN
10.0000 mg | Freq: Once | INTRAMUSCULAR | Status: AC
  Administered 2016-08-09: 10 mg via INTRAVENOUS

## 2016-08-09 NOTE — Patient Instructions (Signed)
[facility] at [facility] Discharge Instructions  RECOMMENDATIONS MADE BY THE CONSULTANT AND ANY TEST RESULTS WILL BE SENT TO YOUR REFERRING PHYSICIAN.  You were seen today by Dr. Zhou.   Thank you for choosing  [facility] Center at [facility] to provide your oncology and hematology care.  To afford each patient quality time with our provider, please arrive at least 15 minutes before your scheduled appointment time.    If you have a lab appointment with the Cancer Center please come in thru the  Main Entrance and check in at the main information desk  You need to re-schedule your appointment should you arrive 10 or more minutes late.  We strive to give you quality time with our providers, and arriving late affects you and other patients whose appointments are after yours.  Also, if you no show three or more times for appointments you may be dismissed from the clinic at the providers discretion.     Again, thank you for choosing [facility].  Our hope is that these requests will decrease the amount of time that you wait before being seen by our physicians.       _____________________________________________________________  Should you have questions after your visit to  Cancer Center, please contact our office at [phone] between the hours of 8:30 a.m. and 4:30 p.m.  Voicemails left after 4:30 p.m. will not be returned until the following business day.  For prescription refill requests, have your pharmacy contact our office.       Resources For Cancer Patients and their Caregivers ? American Cancer Society: Can assist with transportation, wigs, general needs, runs Look Good Feel Better.        1-888-227-6333 ? Cancer Care: Provides financial assistance, online support groups, medication/co-pay assistance.  1-800-813-HOPE (4673) ? [facility] Assists [place] cancer patients and their families  through emotional , educational and financial support.  336-427-4357 ? [place] DSS Where to apply for food stamps, Medicaid and utility assistance. 336-342-1394 ? RCATS: Transportation to medical appointments. 336-347-2287 ? Social Security Administration: May apply for disability if have a Stage IV cancer. 336-342-7796 1-800-772-1213 ? [place] Aging, Disability and Transit Services: Assists with nutrition, care and transit needs. 336-349-2343  Cancer Center Support Programs: @10RELATIVEDAYS@ > Cancer Support Group  2nd Tuesday of the month 1pm-2pm, Journey Room  > Creative Journey  3rd Tuesday of the month 1130am-1pm, Journey Room  > Look Good Feel Better  1st Wednesday of the month 10am-12 noon, Journey Room (Call American Cancer [employer] to register 1-800-395-5775)    

## 2016-08-09 NOTE — Progress Notes (Unsigned)
Faxed referral/pt records to [facility].

## 2016-08-09 NOTE — Patient Instructions (Signed)
[facility] Discharge Instructions for Patients Receiving Chemotherapy   Beginning January 23rd 2017 lab work for the [facility] will be done in the  Main lab at [facility] on 1st floor. If you have a lab appointment with the [facility] please come in thru the  Main Entrance and check in at the main information desk   Today you received the following chemotherapy agents:  Taxotere and Cytoxan  If you develop nausea and vomiting, or diarrhea that is not controlled by your medication, call the clinic.  The clinic phone number is [phone]. Office hours are Monday-Friday 8:30am-5:00pm.  BELOW ARE SYMPTOMS THAT SHOULD BE REPORTED IMMEDIATELY:  *FEVER GREATER THAN 101.0 F  *CHILLS WITH OR WITHOUT FEVER  NAUSEA AND VOMITING THAT IS NOT CONTROLLED WITH YOUR NAUSEA MEDICATION  *UNUSUAL SHORTNESS OF BREATH  *UNUSUAL BRUISING OR BLEEDING  TENDERNESS IN MOUTH AND THROAT WITH OR WITHOUT PRESENCE OF ULCERS  *URINARY PROBLEMS  *BOWEL PROBLEMS  UNUSUAL RASH Items with * indicate a potential emergency and should be followed up as soon as possible. If you have an emergency after office hours please contact your primary care physician or go to the nearest emergency department.  Please call the clinic during office hours if you have any questions or concerns.   You may also contact the Patient Navigator at [phone] should you have any questions or need assistance in obtaining follow up care.      Resources For Cancer Patients and their Caregivers ? American Cancer Society: Can assist with transportation, wigs, general needs, runs Look Good Feel Better.        305-195-0767 ? Cancer Care: Provides financial assistance, online support groups, medication/co-pay assistance.  1-800-813-HOPE 412 410 8831) ? [facility] Assists [place] cancer patients and their families through emotional , educational and financial support.   (303)756-9398 ? [place] DSS Where to apply for food stamps, Medicaid and utility assistance. 949 863 2865 ? RCATS: Transportation to medical appointments. (251) 796-2985 ? Social Security Administration: May apply for disability if have a Stage IV cancer. [phone] ? [employer], Disability and Transit Services: Assists with nutrition, care and transit needs. 762-755-2064

## 2016-08-09 NOTE — Progress Notes (Signed)
[name], [place] [address]   CURRENT THERAPY: TC beginning on 05/31/2016  INTERVAL HISTORY: Margaret Mcdaniel 66 y.o. female returns for followup of Stage IB (T2N1AM0) left breast cancer, ER/PR 100%, HER2 NEGATIVE, measuring 2.2 cm in greatest dimension and anterior margin broadly positive for invasive ductal carcinoma (upper inner quadrant) and 1/6 positive lymph nodes.    Breast cancer metastasized to axillary lymph node, left ([place])   04/14/2016 Initial Diagnosis    Breast cancer metastasized to axillary lymph node, left ([facility])     04/14/2016 Surgery    Left modified radical mastectomy - INVASIVE GRADE 2 DUCTAL CARCINOMA, SPANNING 2.2 CM IN GREATEST DIMENSION. - ASSOCIATED INTERMEDIATE GRADE DUCTAL CARCINOMA IN SITU. - ANTERIOR MARGIN IS BROADLY POSITIVE FOR INVASIVE DUCTAL CARCINOMA IN AREA OF TUMOR (UPPER INNER QUADRANT). - OTHER MARGINS ARE NEGATIVE. - ONE OF SIX LYMPH NODES POSITIVE FOR METASTATIC DUCTAL CARCINOMA (1/6). - ER/PR positive, HER2 negative. - pT2 pN1a cM0      05/14/2016 Imaging    CT chest/abd/pelvis: IMPRESSION: 1. Left mastectomy changes with a seroma. Rounded regions of low attenuation in the axilla adjacent to surgical clips are favored to be postsurgical changes rather than mildly prominent lymph nodes. 2. The left adrenal gland is mildly thickened, likely due to hyperplasia. A tiny 6 mm nodule is likely a small adenoma. 3. Atherosclerotic changes in a non aneurysmal aorta. 4. Anterior hernia mesh is calcified. Fluid just deep to the hernia mesh is likely not acute and of doubtful acute significance.      05/26/2016 Procedure    Port-A-Cath insertion, needle aspiration of wound seroma by Dr. Arnoldo Morale      05/31/2016 -  Chemotherapy    The patient had palonosetron (ALOXI) injection 0.25 mg, 0.25 mg, Intravenous,  Once, 1 of 4 cycles  pegfilgrastim (NEULASTA) injection 6 mg, 6 mg, Subcutaneous,  Once, 1 of 4  cycles  cyclophosphamide (CYTOXAN) 1,200 mg in sodium chloride 0.9 % 250 mL chemo infusion, 600 mg/m2 = 1,200 mg, Intravenous,  Once, 1 of 4 cycles  DOCEtaxel (TAXOTERE) 150 mg in dextrose 5 % 250 mL chemo infusion, 75 mg/m2 = 150 mg, Intravenous,  Once, 1 of 4 cycles  for chemotherapy treatment.        06/07/2016 - 06/14/2016 Hospital Admission    Admit date: 06/07/2016 Admission diagnosis: Pyelonephritis Additional comments: Treated with antibiotics      06/21/2016 Treatment Plan Change    Tx deferred x 1 week due to hospitalization      06/22/2016 - 06/23/2016 Hospital Admission    Admit date: 06/22/2016 Admission diagnosis: Fever Additional comments: Source not identified      Kaylor Maiers presents today with her son for continuing follow up. She is scheduled for cycle 4 Breast TC q21d today.  Patient states that overall she has been doing well except that she continues to have persistent fevers. Her fevers or nights associated with any symptoms such as cough, shortness of breath, runny nose, dysuria. She continues take Tylenol which improves her fevers. She denies any chest pain, shortness of breath, or abdominal pain.  She denies any diarrhea or constipation. Denies neuropathy. Denies any changes in appetite or weight loss.   Review of Systems  Constitutional: Positive for fever (resolved with tylenol). Negative for chills and weight loss.  HENT: Negative.   Eyes: Negative.   Respiratory: Negative.  Negative for cough.   Cardiovascular: Negative.  Negative for  chest pain.  Gastrointestinal: Negative.  Negative for abdominal pain, constipation, diarrhea, nausea and vomiting.  Genitourinary: Negative.   Musculoskeletal: Negative.   Skin: Negative.   Neurological: Negative.  Negative for tingling and weakness.  Endo/Heme/Allergies: Negative.   Psychiatric/Behavioral: Negative.     Past Medical History:  Diagnosis Date  . Anxiety   . Arthritis   . Breast cancer metastasized  to axillary lymph node, left (HCC) 04/14/2016  . Cancer (HCC)    left breast  . CKD (chronic kidney disease) stage 3, GFR 30-59 ml/min 06/08/2016  . COPD (chronic obstructive pulmonary disease) (HCC)   . Diabetes mellitus without complication (HCC)   . GERD (gastroesophageal reflux disease)   . History of kidney stones   . Hypothyroidism   . Mental disorder   . Neuromuscular disorder (HCC)   . PONV (postoperative nausea and vomiting)   . RLS (restless legs syndrome) 06/29/2016  . Secondary Parkinson disease (HCC) 06/29/2016  . Sleep apnea    Uses CPAP  . Uncontrolled type 2 diabetes mellitus with hyperglycemia, with long-term current use of insulin (HCC) 06/08/2016    Past Surgical History:  Procedure Laterality Date  . ANKLE SURGERY Right    fusion  . APPENDECTOMY    . CHOLECYSTECTOMY    . EXPLORATORY LAPAROTOMY    . HEMORRHOID SURGERY    . MASTECTOMY MODIFIED RADICAL Left 04/14/2016   Procedure: LEFT MODIFIED RADICAL MASTECTOMY;  Surgeon: Franky Macho, MD;  Location: [facility];  Service: General;  Laterality: Left;  . PORTACATH PLACEMENT N/A 05/26/2016   Procedure: INSERTION PORT-A-CATH RIGHT SUBCLAVIAN AND  DRAINAGE OF SEROMA LEFT CHEST;  Surgeon: Franky Macho, MD;  Location: [facility];  Service: General;  Laterality: N/A;  . ROTATOR CUFF REPAIR    . TONSILLECTOMY    . TUBAL LIGATION      Family History  Problem Relation Age of Onset  . Heart disease Mother   . Bipolar disorder Father   . Heart disease Father   . Prostate cancer Brother     Social History   Social History  . Marital status: Divorced    Spouse name: N/A  . Number of children: 2  . Years of education: 12   Occupational History  . Retired    Social History Main Topics  . Smoking status: Former Smoker    Packs/day: 1.00    Years: 50.00    Types: Cigarettes    Quit date: 04/12/2014  . Smokeless tobacco: Never Used  . Alcohol use No  . Drug use: No  . Sexual activity: Not Asked   Other Topics Concern  .  None   Social History Narrative   Lives alone   Caffeine use: 2 cups coffee someitmes   Right- handed     PHYSICAL EXAMINATION  ECOG PERFORMANCE STATUS: 1 - Symptomatic but completely ambulatory  .Physical Exam  Constitutional: She is oriented to person, place, and time and well-developed, well-nourished, and in no distress. No distress.  HENT:  Head: Normocephalic and atraumatic.  Mouth/Throat: No oropharyngeal exudate.  Eyes: Conjunctivae and EOM are normal. Pupils are equal, round, and reactive to light. No scleral icterus.  Neck: Normal range of motion. Neck supple. No JVD present.  Cardiovascular: Normal rate, regular rhythm and normal heart sounds.  Exam reveals no gallop and no friction rub.   No murmur heard. Pulmonary/Chest: Effort normal and breath sounds normal. No respiratory distress. She has no wheezes. She has no rales.  Abdominal: Soft. Bowel sounds are normal.  She exhibits no distension. There is no tenderness. There is no guarding.  Musculoskeletal: Normal range of motion. She exhibits no edema or tenderness.  Lymphadenopathy:    She has no cervical adenopathy.  Neurological: She is alert and oriented to person, place, and time. No cranial nerve deficit. Gait normal.  Skin: Skin is warm and dry. No rash noted. No erythema. No pallor.  Psychiatric: Affect and judgment normal.  Nursing note and vitals reviewed.   LABORATORY DATA: CBC    Component Value Date/Time   WBC 18.6 (H) 08/09/2016 0851   RBC 3.62 (L) 08/09/2016 0851   HGB 9.8 (L) 08/09/2016 0851   HCT 30.7 (L) 08/09/2016 0851   PLT 445 (H) 08/09/2016 0851   MCV 84.8 08/09/2016 0851   MCH 27.1 08/09/2016 0851   MCHC 31.9 08/09/2016 0851   RDW 17.4 (H) 08/09/2016 0851   LYMPHSABS 1.6 08/09/2016 0851   MONOABS 1.1 (H) 08/09/2016 0851   EOSABS 0.0 08/09/2016 0851   BASOSABS 0.0 08/09/2016 0851      Chemistry      Component Value Date/Time   NA 139 08/09/2016 0851   K 3.9 08/09/2016 0851   CL  106 08/09/2016 0851   CO2 20 (L) 08/09/2016 0851   BUN 30 (H) 08/09/2016 0851   CREATININE 1.32 (H) 08/09/2016 0851      Component Value Date/Time   CALCIUM 9.3 08/09/2016 0851   ALKPHOS 73 08/09/2016 0851   AST 21 08/09/2016 0851   ALT 18 08/09/2016 0851   BILITOT 0.1 (L) 08/09/2016 1610        PENDING LABS:   RADIOGRAPHIC STUDIES: I have personally reviewed the radiological images as listed and agreed with the findings in the report.  CT ABDOMEN AND PELVIS WITH CONTRAST 06/22/2016  IMPRESSION: 1. No perinephric fluid collections or abscess is visualized. 2. Suspect mild hepatic steatosis 3. Nonobstructing stones in the left kidney 4. Stable peripherally calcified probable hernia mesh with fluid density in the anterior abdominal wall. 5. Right lateral abdominal wall hernia containing a small amount of colon. No obstruction. 6. Mesh material in the coccygeal region.  Possible rectal prolapse  2D DIGITAL DIAGNOSTIC BILATERAL MAMMOGRAM WITH CAD AND ADJUNCT TOMO 07/05/2016  IMPRESSION: Highly suspicious mass in the left breast at 10 o'clock, 10 cm from the nipple. Abnormal left axillary lymph node.      PATHOLOGY:     ASSESSMENT AND PLAN:  Stage IB (T2N1AM0) left breast cancer, ER/PR 100%, HER2 NEGATIVE, measuring 2.2 cm in greatest dimension and anterior margin broadly positive for invasive ductal carcinoma (upper inner quadrant) and 1/6 positive lymph nodes.  -Proceed with cycle 4 of TC today as planned. Tolerating treatment well.   -I will make a referral to rad-onc in [facility] for adjuvant XRT.   - RTC in 8 weeks for follow up. She should be close to finishing up her adjuvant XRT at that time and I will discuss placement on an AI with her on her next visit.   THERAPY PLAN:  TC x 4 cycles followed by adjuvant XRT.  Following completion of XRT, she will be placed on an aromatase inhibitor for 5 years.    All questions were answered. The patient knows to call the  clinic with any problems, questions or concerns. We can certainly see the patient much sooner if necessary.  This note is electronically signed by: Twana First, MD 08/09/2016 12:41 PM

## 2016-08-09 NOTE — Progress Notes (Signed)
Tolerated tx w/o adverse reaction.  Alert, in no distress.  VSS.  Discharged ambulatory. 

## 2016-08-10 DIAGNOSIS — C50212 Malignant neoplasm of upper-inner quadrant of left female breast: Secondary | ICD-10-CM | POA: Diagnosis not present

## 2016-08-10 DIAGNOSIS — G20 Parkinson's disease: Secondary | ICD-10-CM | POA: Diagnosis not present

## 2016-08-10 DIAGNOSIS — J449 Chronic obstructive pulmonary disease, unspecified: Secondary | ICD-10-CM | POA: Diagnosis not present

## 2016-08-10 DIAGNOSIS — Z9012 Acquired absence of left breast and nipple: Secondary | ICD-10-CM | POA: Diagnosis not present

## 2016-08-10 DIAGNOSIS — Z87891 Personal history of nicotine dependence: Secondary | ICD-10-CM | POA: Diagnosis not present

## 2016-08-10 DIAGNOSIS — Z170 Estrogen receptor positive status [ER+]: Secondary | ICD-10-CM | POA: Diagnosis not present

## 2016-08-10 DIAGNOSIS — R260 Ataxic gait: Secondary | ICD-10-CM | POA: Diagnosis not present

## 2016-08-10 DIAGNOSIS — C50612 Malignant neoplasm of axillary tail of left female breast: Secondary | ICD-10-CM | POA: Diagnosis not present

## 2016-08-10 DIAGNOSIS — M6281 Muscle weakness (generalized): Secondary | ICD-10-CM | POA: Diagnosis not present

## 2016-08-10 DIAGNOSIS — E1165 Type 2 diabetes mellitus with hyperglycemia: Secondary | ICD-10-CM | POA: Diagnosis not present

## 2016-08-11 ENCOUNTER — Encounter (HOSPITAL_COMMUNITY): Payer: Self-pay

## 2016-08-11 ENCOUNTER — Encounter (HOSPITAL_BASED_OUTPATIENT_CLINIC_OR_DEPARTMENT_OTHER): Payer: PPO

## 2016-08-11 VITALS — BP 121/96 | HR 104 | Temp 98.6°F | Resp 18

## 2016-08-11 DIAGNOSIS — C773 Secondary and unspecified malignant neoplasm of axilla and upper limb lymph nodes: Secondary | ICD-10-CM

## 2016-08-11 DIAGNOSIS — C50212 Malignant neoplasm of upper-inner quadrant of left female breast: Secondary | ICD-10-CM

## 2016-08-11 DIAGNOSIS — Z5189 Encounter for other specified aftercare: Secondary | ICD-10-CM

## 2016-08-11 DIAGNOSIS — C50912 Malignant neoplasm of unspecified site of left female breast: Secondary | ICD-10-CM

## 2016-08-11 MED ORDER — PEGFILGRASTIM INJECTION 6 MG/0.6ML ~~LOC~~
6.0000 mg | PREFILLED_SYRINGE | Freq: Once | SUBCUTANEOUS | Status: AC
  Administered 2016-08-11: 6 mg via SUBCUTANEOUS

## 2016-08-11 NOTE — Patient Instructions (Signed)
[facility] at [facility] Discharge Instructions  RECOMMENDATIONS MADE BY THE CONSULTANT AND ANY TEST RESULTS WILL BE SENT TO YOUR REFERRING PHYSICIAN.  Neulasta injection today. Return as scheduled.  Thank you for choosing [facility] at [facility] to provide your oncology and hematology care.  To afford each patient quality time with our provider, please arrive at least 15 minutes before your scheduled appointment time.    If you have a lab appointment with the Cancer Center please come in thru the  Main Entrance and check in at the main information desk  You need to re-schedule your appointment should you arrive 10 or more minutes late.  We strive to give you quality time with our providers, and arriving late affects you and other patients whose appointments are after yours.  Also, if you no show three or more times for appointments you may be dismissed from the clinic at the providers discretion.     Again, thank you for choosing [facility].  Our hope is that these requests will decrease the amount of time that you wait before being seen by our physicians.       _____________________________________________________________  Should you have questions after your visit to [facility], please contact our office at [phone] between the hours of 8:30 a.m. and 4:30 p.m.  Voicemails left after 4:30 p.m. will not be returned until the following business day.  For prescription refill requests, have your pharmacy contact our office.       Resources For Cancer Patients and their Caregivers ? American Cancer Society: Can assist with transportation, wigs, general needs, runs Look Good Feel Better.        1-888-227-6333 ? Cancer Care: Provides financial assistance, online support groups, medication/co-pay assistance.  1-800-813-HOPE (4673) ? [facility] Assists [place] cancer patients and  their families through emotional , educational and financial support.  336-427-4357 ? [place] DSS Where to apply for food stamps, Medicaid and utility assistance. 336-342-1394 ? [facility]: Transportation to medical appointments. 336-347-2287 ? Social Security Administration: May apply for disability if have a Stage IV cancer. 336-342-7796 1-800-772-1213 ? [place] Aging, Disability and Transit Services: Assists with nutrition, care and transit needs. 336-349-2343  Cancer Center Support Programs: @[email or web address]@ > Cancer Support Group  2nd Tuesday of the month 1pm-2pm, Journey Room  > Creative Journey  3rd Tuesday of the month 1130am-1pm, Journey Room  > Look Good Feel Better  1st Wednesday of the month 10am-12 noon, Journey Room (Call American Cancer [employer] to register 1-800-395-5775)   

## 2016-08-11 NOTE — Progress Notes (Signed)
Margaret Mcdaniel presents today for injection per the provider's orders.  Neulasta administration without incident; see MAR for injection details.  Patient tolerated procedure well and without incident.  No questions or complaints noted at this time.  Discharged ambulatory.

## 2016-08-12 ENCOUNTER — Telehealth (HOSPITAL_COMMUNITY): Payer: Self-pay | Admitting: *Deleted

## 2016-08-12 DIAGNOSIS — C50212 Malignant neoplasm of upper-inner quadrant of left female breast: Secondary | ICD-10-CM | POA: Diagnosis not present

## 2016-08-12 DIAGNOSIS — G20 Parkinson's disease: Secondary | ICD-10-CM | POA: Diagnosis not present

## 2016-08-12 DIAGNOSIS — R599 Enlarged lymph nodes, unspecified: Secondary | ICD-10-CM | POA: Diagnosis not present

## 2016-08-12 DIAGNOSIS — Z9221 Personal history of antineoplastic chemotherapy: Secondary | ICD-10-CM | POA: Diagnosis not present

## 2016-08-12 DIAGNOSIS — Z170 Estrogen receptor positive status [ER+]: Secondary | ICD-10-CM | POA: Diagnosis not present

## 2016-08-12 DIAGNOSIS — Z9012 Acquired absence of left breast and nipple: Secondary | ICD-10-CM | POA: Diagnosis not present

## 2016-08-12 DIAGNOSIS — K439 Ventral hernia without obstruction or gangrene: Secondary | ICD-10-CM | POA: Diagnosis not present

## 2016-08-12 DIAGNOSIS — N200 Calculus of kidney: Secondary | ICD-10-CM | POA: Diagnosis not present

## 2016-08-12 NOTE — Telephone Encounter (Signed)
left voice messge regarding appointment.

## 2016-08-13 DIAGNOSIS — J449 Chronic obstructive pulmonary disease, unspecified: Secondary | ICD-10-CM | POA: Diagnosis not present

## 2016-08-13 DIAGNOSIS — R260 Ataxic gait: Secondary | ICD-10-CM | POA: Diagnosis not present

## 2016-08-13 DIAGNOSIS — Z9012 Acquired absence of left breast and nipple: Secondary | ICD-10-CM | POA: Diagnosis not present

## 2016-08-13 DIAGNOSIS — G20 Parkinson's disease: Secondary | ICD-10-CM | POA: Diagnosis not present

## 2016-08-13 DIAGNOSIS — Z87891 Personal history of nicotine dependence: Secondary | ICD-10-CM | POA: Diagnosis not present

## 2016-08-13 DIAGNOSIS — C50212 Malignant neoplasm of upper-inner quadrant of left female breast: Secondary | ICD-10-CM | POA: Diagnosis not present

## 2016-08-13 DIAGNOSIS — Z170 Estrogen receptor positive status [ER+]: Secondary | ICD-10-CM | POA: Diagnosis not present

## 2016-08-13 DIAGNOSIS — C50612 Malignant neoplasm of axillary tail of left female breast: Secondary | ICD-10-CM | POA: Diagnosis not present

## 2016-08-13 DIAGNOSIS — E1165 Type 2 diabetes mellitus with hyperglycemia: Secondary | ICD-10-CM | POA: Diagnosis not present

## 2016-08-13 DIAGNOSIS — M6281 Muscle weakness (generalized): Secondary | ICD-10-CM | POA: Diagnosis not present

## 2016-08-19 DIAGNOSIS — R509 Fever, unspecified: Secondary | ICD-10-CM | POA: Diagnosis not present

## 2016-08-19 DIAGNOSIS — R309 Painful micturition, unspecified: Secondary | ICD-10-CM | POA: Diagnosis not present

## 2016-08-24 ENCOUNTER — Encounter (HOSPITAL_COMMUNITY): Payer: Self-pay

## 2016-08-24 ENCOUNTER — Inpatient Hospital Stay (HOSPITAL_COMMUNITY)
Admission: EM | Admit: 2016-08-24 | Discharge: 2016-08-26 | DRG: 689 | Disposition: A | Discharge status: Home Health | Payer: PPO | Attending: Internal Medicine | Admitting: Internal Medicine

## 2016-08-24 ENCOUNTER — Emergency Department (HOSPITAL_COMMUNITY): Payer: PPO

## 2016-08-24 DIAGNOSIS — G219 Secondary parkinsonism, unspecified: Secondary | ICD-10-CM | POA: Diagnosis not present

## 2016-08-24 DIAGNOSIS — E1122 Type 2 diabetes mellitus with diabetic chronic kidney disease: Secondary | ICD-10-CM | POA: Diagnosis not present

## 2016-08-24 DIAGNOSIS — N10 Acute tubulo-interstitial nephritis: Principal | ICD-10-CM | POA: Diagnosis present

## 2016-08-24 DIAGNOSIS — G9340 Encephalopathy, unspecified: Secondary | ICD-10-CM | POA: Diagnosis present

## 2016-08-24 DIAGNOSIS — Z7982 Long term (current) use of aspirin: Secondary | ICD-10-CM | POA: Diagnosis not present

## 2016-08-24 DIAGNOSIS — C773 Secondary and unspecified malignant neoplasm of axilla and upper limb lymph nodes: Secondary | ICD-10-CM | POA: Diagnosis present

## 2016-08-24 DIAGNOSIS — Z91040 Latex allergy status: Secondary | ICD-10-CM

## 2016-08-24 DIAGNOSIS — N183 Chronic kidney disease, stage 3 unspecified: Secondary | ICD-10-CM | POA: Diagnosis present

## 2016-08-24 DIAGNOSIS — Z882 Allergy status to sulfonamides status: Secondary | ICD-10-CM | POA: Diagnosis not present

## 2016-08-24 DIAGNOSIS — N12 Tubulo-interstitial nephritis, not specified as acute or chronic: Secondary | ICD-10-CM | POA: Diagnosis present

## 2016-08-24 DIAGNOSIS — Z881 Allergy status to other antibiotic agents status: Secondary | ICD-10-CM | POA: Diagnosis not present

## 2016-08-24 DIAGNOSIS — K219 Gastro-esophageal reflux disease without esophagitis: Secondary | ICD-10-CM | POA: Diagnosis present

## 2016-08-24 DIAGNOSIS — R52 Pain, unspecified: Secondary | ICD-10-CM

## 2016-08-24 DIAGNOSIS — Z853 Personal history of malignant neoplasm of breast: Secondary | ICD-10-CM | POA: Diagnosis not present

## 2016-08-24 DIAGNOSIS — E1165 Type 2 diabetes mellitus with hyperglycemia: Secondary | ICD-10-CM

## 2016-08-24 DIAGNOSIS — Z79899 Other long term (current) drug therapy: Secondary | ICD-10-CM

## 2016-08-24 DIAGNOSIS — I517 Cardiomegaly: Secondary | ICD-10-CM | POA: Diagnosis not present

## 2016-08-24 DIAGNOSIS — E039 Hypothyroidism, unspecified: Secondary | ICD-10-CM | POA: Diagnosis not present

## 2016-08-24 DIAGNOSIS — C50912 Malignant neoplasm of unspecified site of left female breast: Secondary | ICD-10-CM | POA: Diagnosis present

## 2016-08-24 DIAGNOSIS — G2581 Restless legs syndrome: Secondary | ICD-10-CM | POA: Diagnosis present

## 2016-08-24 DIAGNOSIS — J449 Chronic obstructive pulmonary disease, unspecified: Secondary | ICD-10-CM | POA: Diagnosis not present

## 2016-08-24 DIAGNOSIS — Z794 Long term (current) use of insulin: Secondary | ICD-10-CM | POA: Diagnosis not present

## 2016-08-24 LAB — URINALYSIS, ROUTINE W REFLEX MICROSCOPIC
BILIRUBIN URINE: NEGATIVE
Glucose, UA: NEGATIVE mg/dL
KETONES UR: NEGATIVE mg/dL
Nitrite: NEGATIVE
PROTEIN: NEGATIVE mg/dL
SPECIFIC GRAVITY, URINE: 1.013 (ref 1.005–1.030)
pH: 5 (ref 5.0–8.0)

## 2016-08-24 LAB — CBC WITH DIFFERENTIAL/PLATELET
BASOS PCT: 0 %
Basophils Absolute: 0 10*3/uL (ref 0.0–0.1)
Eosinophils Absolute: 0 10*3/uL (ref 0.0–0.7)
Eosinophils Relative: 0 %
HEMATOCRIT: 32.2 % — AB (ref 36.0–46.0)
HEMOGLOBIN: 10.2 g/dL — AB (ref 12.0–15.0)
LYMPHS ABS: 3.5 10*3/uL (ref 0.7–4.0)
Lymphocytes Relative: 20 %
MCH: 26.8 pg (ref 26.0–34.0)
MCHC: 31.7 g/dL (ref 30.0–36.0)
MCV: 84.7 fL (ref 78.0–100.0)
MONOS PCT: 6 %
Monocytes Absolute: 1.1 10*3/uL — ABNORMAL HIGH (ref 0.1–1.0)
NEUTROS ABS: 13.3 10*3/uL — AB (ref 1.7–7.7)
NEUTROS PCT: 74 %
Platelets: 178 10*3/uL (ref 150–400)
RBC: 3.8 MIL/uL — ABNORMAL LOW (ref 3.87–5.11)
RDW: 19.2 % — ABNORMAL HIGH (ref 11.5–15.5)
WBC: 18 10*3/uL — ABNORMAL HIGH (ref 4.0–10.5)

## 2016-08-24 LAB — PROTIME-INR
INR: 1.07
PROTHROMBIN TIME: 13.9 s (ref 11.4–15.2)

## 2016-08-24 LAB — COMPREHENSIVE METABOLIC PANEL
ALT: 16 U/L (ref 14–54)
ANION GAP: 11 (ref 5–15)
AST: 20 U/L (ref 15–41)
Albumin: 3.1 g/dL — ABNORMAL LOW (ref 3.5–5.0)
Alkaline Phosphatase: 82 U/L (ref 38–126)
BUN: 20 mg/dL (ref 6–20)
CHLORIDE: 105 mmol/L (ref 101–111)
CO2: 21 mmol/L — AB (ref 22–32)
Calcium: 8.5 mg/dL — ABNORMAL LOW (ref 8.9–10.3)
Creatinine, Ser: 1.18 mg/dL — ABNORMAL HIGH (ref 0.44–1.00)
GFR, EST AFRICAN AMERICAN: 54 mL/min — AB (ref >60)
GFR, EST NON AFRICAN AMERICAN: 47 mL/min — AB (ref >60)
Glucose, Bld: 153 mg/dL — ABNORMAL HIGH (ref 65–99)
Potassium: 3.8 mmol/L (ref 3.5–5.1)
SODIUM: 137 mmol/L (ref 135–145)
Total Bilirubin: 0.4 mg/dL (ref 0.3–1.2)
Total Protein: 6.5 g/dL (ref 6.5–8.1)

## 2016-08-24 LAB — ACETAMINOPHEN LEVEL: Acetaminophen (Tylenol), Serum: <10 ug/mL — ABNORMAL LOW (ref 10–30)

## 2016-08-24 LAB — I-STAT CG4 LACTIC ACID, ED: Lactic Acid, Venous: 3.63 mmol/L (ref 0.5–1.9)

## 2016-08-24 IMAGING — DX DG CHEST 2V
2 series · 3 of 3 positions shown · non-contrast
Comparison: Prior radiograph from [DATE].

CLINICAL DATA: Initial evaluation for acute fever, back pain.

EXAM:
CHEST  2 VIEW

[Series 2: chest lat · 0.14mm/px · 2 of 2 slices shown]
[im 1/2]
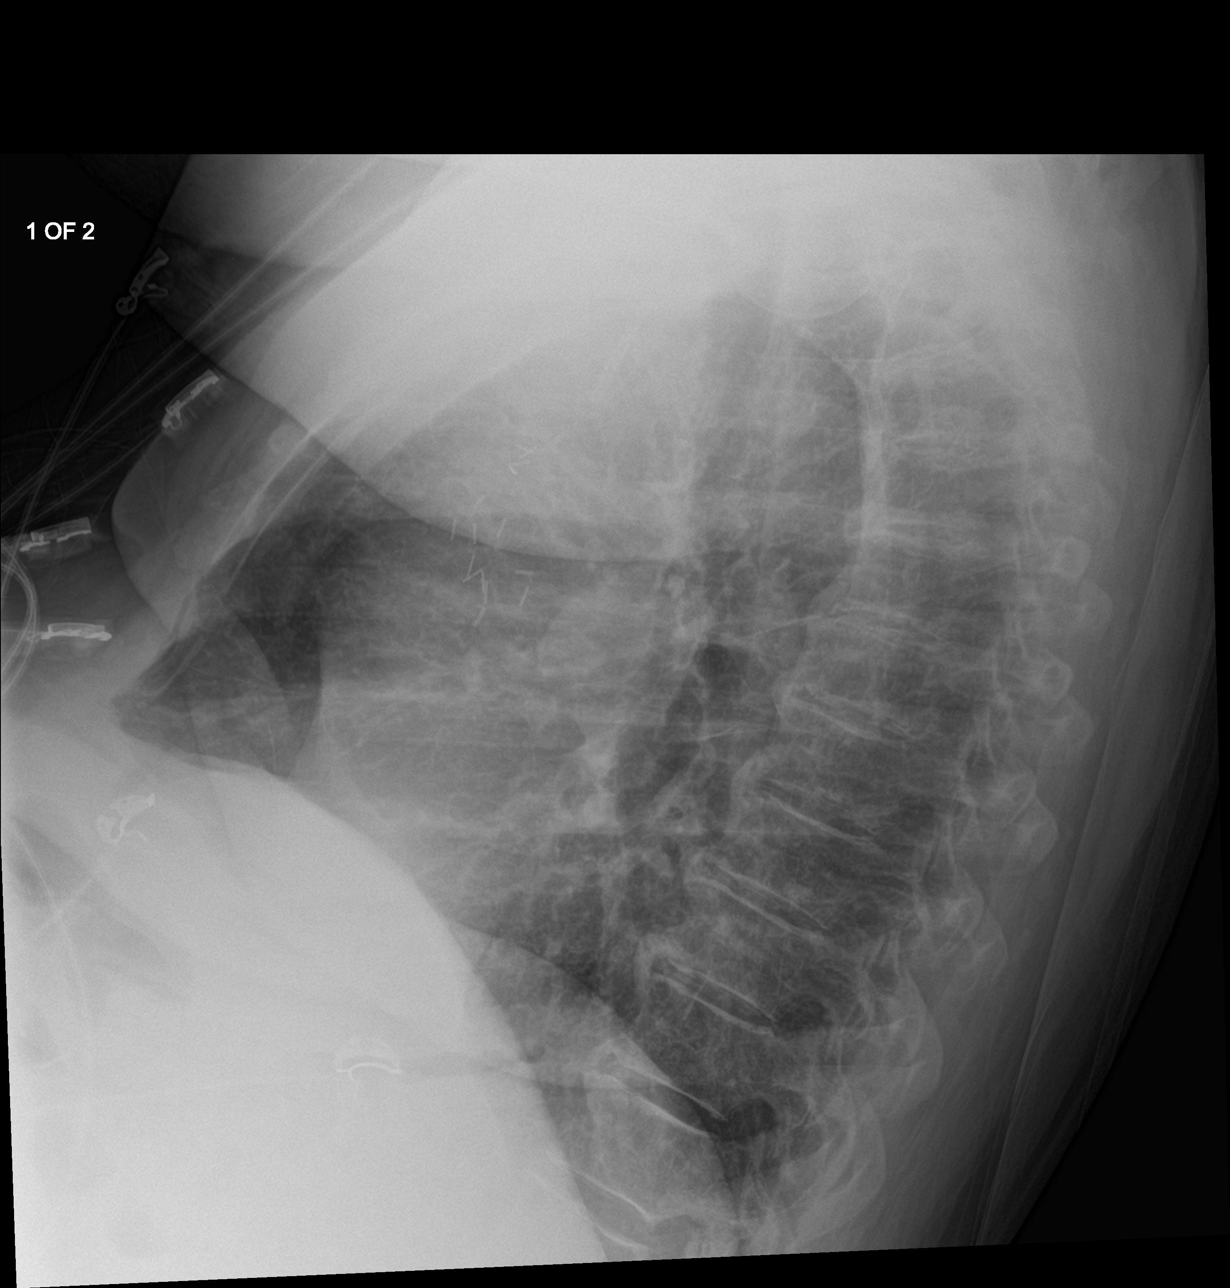
[im 2/2]
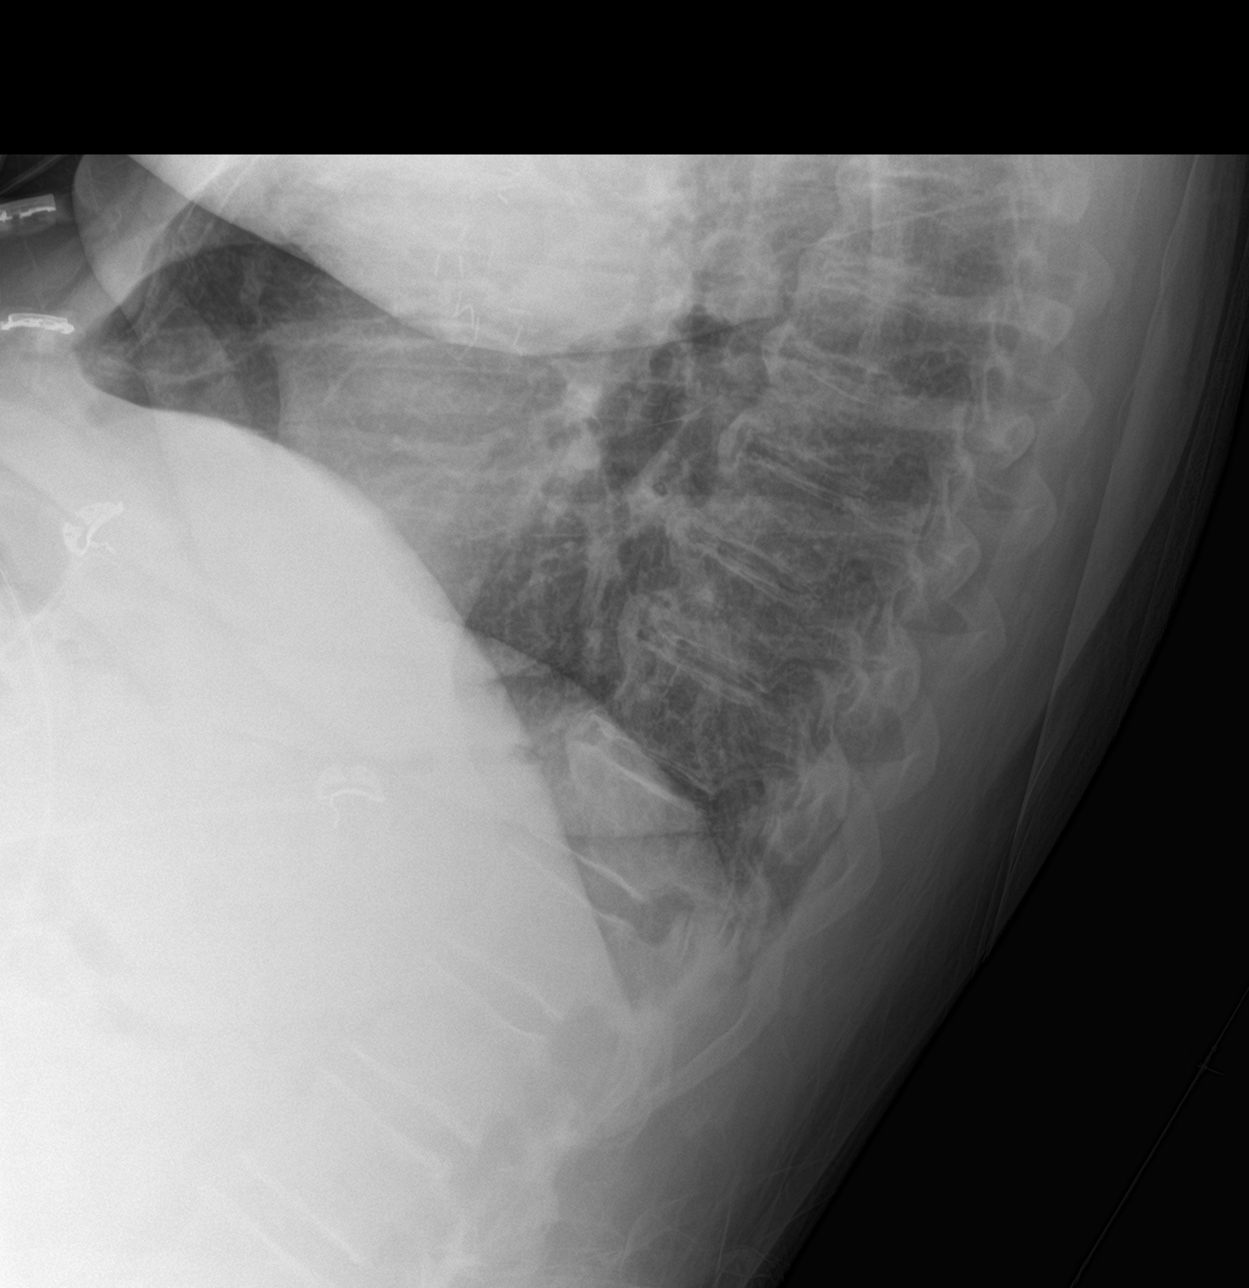

[chest ap]
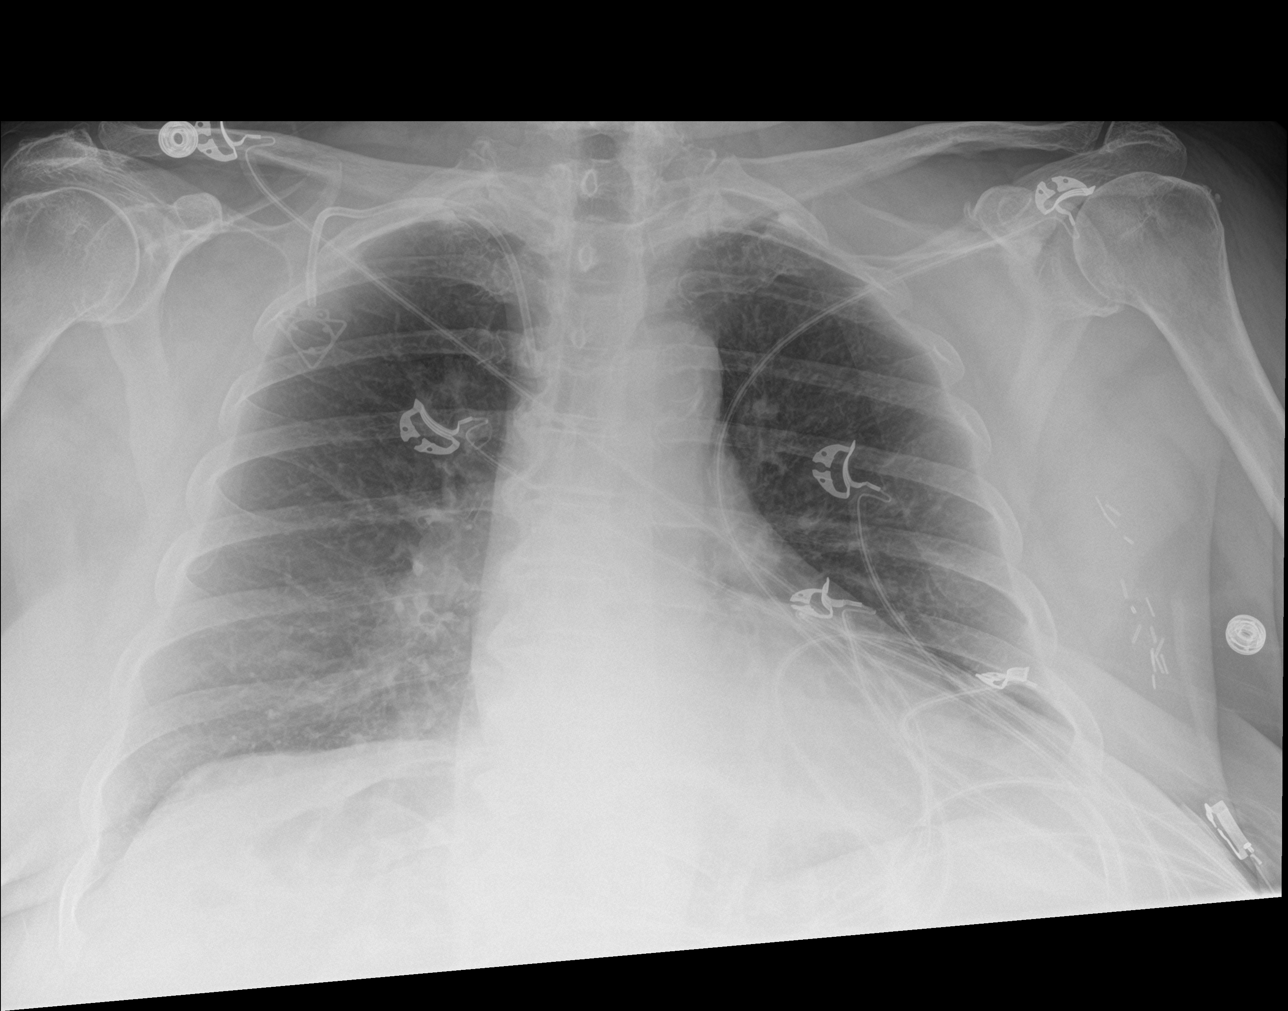

[3 of 3 positions shown; findings below may reference images not displayed]

FINDINGS: Right-sided Port-A-Cath in place. Moderate cardiomegaly, stable.
Mediastinal silhouette normal. Aortic atherosclerosis.

Lungs normally inflated. No focal infiltrates. No pulmonary edema or
pleural effusion. No pneumothorax.

No acute osseous abnormality. Surgical clips overlie the left
axilla.
IMPRESSION: 1. No radiographic evidence for active cardiopulmonary disease.
2. Stable cardiomegaly without pulmonary edema.
3. Aortic atherosclerosis.

## 2016-08-24 MED ORDER — DEXTROSE 5 % IV SOLN
1.0000 g | Freq: Three times a day (TID) | INTRAVENOUS | Status: DC
  Administered 2016-08-25 – 2016-08-26 (×4): 1 g via INTRAVENOUS
  Filled 2016-08-24 (×8): qty 1

## 2016-08-24 MED ORDER — LEVOFLOXACIN IN D5W 750 MG/150ML IV SOLN
750.0000 mg | Freq: Once | INTRAVENOUS | Status: AC
  Administered 2016-08-24: 750 mg via INTRAVENOUS
  Filled 2016-08-24: qty 150

## 2016-08-24 MED ORDER — SODIUM CHLORIDE 0.9 % IV BOLUS (SEPSIS)
2000.0000 mL | Freq: Once | INTRAVENOUS | Status: AC
  Administered 2016-08-24: 2000 mL via INTRAVENOUS

## 2016-08-24 MED ORDER — DEXTROSE 5 % IV SOLN
2.0000 g | Freq: Once | INTRAVENOUS | Status: AC
  Administered 2016-08-24: 2 g via INTRAVENOUS
  Filled 2016-08-24: qty 2

## 2016-08-24 MED ORDER — SODIUM CHLORIDE 0.9 % IV BOLUS (SEPSIS)
500.0000 mL | Freq: Once | INTRAVENOUS | Status: AC
  Administered 2016-08-24: 500 mL via INTRAVENOUS

## 2016-08-24 MED ORDER — LEVOFLOXACIN IN D5W 750 MG/150ML IV SOLN: 750.0000 mg | INTRAVENOUS | Status: DC

## 2016-08-24 MED ORDER — VANCOMYCIN HCL IN DEXTROSE 1-5 GM/200ML-% IV SOLN
1000.0000 mg | Freq: Once | INTRAVENOUS | Status: AC
  Administered 2016-08-24: 1000 mg via INTRAVENOUS
  Filled 2016-08-24: qty 200

## 2016-08-24 MED ORDER — VANCOMYCIN HCL IN DEXTROSE 750-5 MG/150ML-% IV SOLN
750.0000 mg | Freq: Two times a day (BID) | INTRAVENOUS | Status: DC
  Filled 2016-08-24: qty 150

## 2016-08-24 MED ORDER — FENTANYL CITRATE (PF) 100 MCG/2ML IJ SOLN
50.0000 ug | Freq: Once | INTRAMUSCULAR | Status: AC
  Administered 2016-08-24: 50 ug via INTRAVENOUS
  Filled 2016-08-24: qty 2

## 2016-08-24 NOTE — Progress Notes (Addendum)
Pharmacy Note:  Initial antibiotics for Vancomycin / Aztreonam / Levaquin ordered by EDP for Sepsis.  Estimated Creatinine Clearance: 43.4 mL/min (A) (by C-G formula based on SCr of 1.32 mg/dL (H)).   Allergies  Allergen Reactions  . Keflex [Cephalexin] Anaphylaxis    Pt "codes" on this medication.  . Sulfa Antibiotics Hives  . Latex Rash    Vitals:   08/24/16 2024 08/24/16 2031  BP: 122/77 (!) 143/81  Pulse: 93 (!) 110  Resp: 20 19  Temp:      Anti-infectives    Start     Dose/Rate Route Frequency Ordered Stop   08/24/16 2045  levofloxacin (LEVAQUIN) IVPB 750 mg     750 mg 100 mL/hr over 90 Minutes Intravenous  Once 08/24/16 2032     08/24/16 2045  aztreonam (AZACTAM) 2 g in dextrose 5 % 50 mL IVPB     2 g 100 mL/hr over 30 Minutes Intravenous  Once 08/24/16 2032     08/24/16 2045  vancomycin (VANCOCIN) IVPB 1000 mg/200 mL premix     1,000 mg 200 mL/hr over 60 Minutes Intravenous  Once 08/24/16 2032       Plan: Initial doses of Vancomycin / Aztreonam / Levquin X 1 ordered. F/U admission orders for further dosing if therapy continued.  Pricilla Larsson, [facility] 08/24/2016 8:49 PM   Addendum: Admission plans still pending.  Will order standing ABX regimens on current orders. Follow up in AM.  Vancomycin 750mg  IV every 12 hours. Levaquin 750mg  IV every 48 hours. Aztreonam 1gm IV every 8 hours. Monitor labs, micro and vitals.   Pricilla Larsson, [facility] 08/24/2016 10:44 PM

## 2016-08-24 NOTE — ED Triage Notes (Signed)
Patient sent by home health for fever over 1 week with lower back pain and dysuria. Has been on 5 days of Cipro with recurrent fever. Last tylenol given at 1500.

## 2016-08-25 ENCOUNTER — Other Ambulatory Visit (HOSPITAL_COMMUNITY): Payer: Self-pay

## 2016-08-25 ENCOUNTER — Observation Stay (HOSPITAL_BASED_OUTPATIENT_CLINIC_OR_DEPARTMENT_OTHER): Payer: PPO

## 2016-08-25 ENCOUNTER — Observation Stay (HOSPITAL_COMMUNITY): Payer: PPO

## 2016-08-25 DIAGNOSIS — Z7982 Long term (current) use of aspirin: Secondary | ICD-10-CM | POA: Diagnosis not present

## 2016-08-25 DIAGNOSIS — Z79899 Other long term (current) drug therapy: Secondary | ICD-10-CM | POA: Diagnosis not present

## 2016-08-25 DIAGNOSIS — E1122 Type 2 diabetes mellitus with diabetic chronic kidney disease: Secondary | ICD-10-CM | POA: Diagnosis not present

## 2016-08-25 DIAGNOSIS — N12 Tubulo-interstitial nephritis, not specified as acute or chronic: Secondary | ICD-10-CM | POA: Diagnosis not present

## 2016-08-25 DIAGNOSIS — E1165 Type 2 diabetes mellitus with hyperglycemia: Secondary | ICD-10-CM

## 2016-08-25 DIAGNOSIS — E039 Hypothyroidism, unspecified: Secondary | ICD-10-CM | POA: Diagnosis not present

## 2016-08-25 DIAGNOSIS — J449 Chronic obstructive pulmonary disease, unspecified: Secondary | ICD-10-CM | POA: Diagnosis not present

## 2016-08-25 DIAGNOSIS — I340 Nonrheumatic mitral (valve) insufficiency: Secondary | ICD-10-CM

## 2016-08-25 DIAGNOSIS — N200 Calculus of kidney: Secondary | ICD-10-CM | POA: Diagnosis not present

## 2016-08-25 DIAGNOSIS — Z794 Long term (current) use of insulin: Secondary | ICD-10-CM

## 2016-08-25 DIAGNOSIS — G2581 Restless legs syndrome: Secondary | ICD-10-CM | POA: Diagnosis not present

## 2016-08-25 DIAGNOSIS — Z91040 Latex allergy status: Secondary | ICD-10-CM | POA: Diagnosis not present

## 2016-08-25 DIAGNOSIS — G9340 Encephalopathy, unspecified: Secondary | ICD-10-CM | POA: Diagnosis not present

## 2016-08-25 DIAGNOSIS — K219 Gastro-esophageal reflux disease without esophagitis: Secondary | ICD-10-CM | POA: Diagnosis not present

## 2016-08-25 DIAGNOSIS — Z882 Allergy status to sulfonamides status: Secondary | ICD-10-CM | POA: Diagnosis not present

## 2016-08-25 DIAGNOSIS — N183 Chronic kidney disease, stage 3 (moderate): Secondary | ICD-10-CM

## 2016-08-25 DIAGNOSIS — Z853 Personal history of malignant neoplasm of breast: Secondary | ICD-10-CM | POA: Diagnosis not present

## 2016-08-25 DIAGNOSIS — I360 Nonrheumatic tricuspid (valve) stenosis: Secondary | ICD-10-CM | POA: Diagnosis not present

## 2016-08-25 DIAGNOSIS — G219 Secondary parkinsonism, unspecified: Secondary | ICD-10-CM | POA: Diagnosis not present

## 2016-08-25 DIAGNOSIS — R52 Pain, unspecified: Secondary | ICD-10-CM | POA: Diagnosis not present

## 2016-08-25 DIAGNOSIS — N10 Acute tubulo-interstitial nephritis: Secondary | ICD-10-CM | POA: Diagnosis not present

## 2016-08-25 DIAGNOSIS — Z881 Allergy status to other antibiotic agents status: Secondary | ICD-10-CM | POA: Diagnosis not present

## 2016-08-25 DIAGNOSIS — C773 Secondary and unspecified malignant neoplasm of axilla and upper limb lymph nodes: Secondary | ICD-10-CM | POA: Diagnosis not present

## 2016-08-25 LAB — ECHOCARDIOGRAM COMPLETE
AO mean calculated velocity dopler: 120 cm/s
AOPV: 0.93 m/s
AOVTI: 36.5 cm
AV Area VTI index: 1.08 cm2/m2
AV Area VTI: 2.11 cm2
AV Area mean vel: 2.19 cm2
AV VEL mean LVOT/AV: 0.97
AV pk vel: 201 cm/s
AVAREAMEANVIN: 1.09 cm2/m2
AVG: 7 mmHg
AVPG: 16 mmHg
CHL CUP AV PEAK INDEX: 1.05
CHL CUP AV VALUE AREA INDEX: 1.08
CHL CUP AV VEL: 2.17
CHL CUP DOP CALC LVOT VTI: 34.9 cm
CHL CUP MV DEC (S): 239
E decel time: 239 msec
E/e' ratio: 13.06
FS: 35 % (ref 28–44)
HEIGHTINCHES: 64 in
IVS/LV PW RATIO, ED: 1.02
LA ID, A-P, ES: 29 mm
LA vol: 41.8 mL
LADIAMINDEX: 1.45 cm/m2
LAVOLA4C: 42 mL
LAVOLIN: 20.8 mL/m2
LEFT ATRIUM END SYS DIAM: 29 mm
LV E/e'average: 13.06
LV PW d: 10.8 mm — AB (ref 0.6–1.1)
LV SIMPSON'S DISK: 66
LV TDI E'LATERAL: 7.62
LV TDI E'MEDIAL: 8.59
LV dias vol index: 33 mL/m2
LV dias vol: 67 mL (ref 46–106)
LV sys vol: 23 mL
LVEEMED: 13.06
LVELAT: 7.62 cm/s
LVOT area: 2.27 cm2
LVOT diameter: 17 mm
LVOT peak grad rest: 14 mmHg
LVOT peak vel: 187 cm/s
LVOTSV: 79 mL
LVOTVTI: 0.96 cm
LVSYSVOLIN: 12 mL/m2
Lateral S' vel: 18.4 cm/s
MV Peak grad: 4 mmHg
MV pk A vel: 143 m/s
MVPKEVEL: 99.5 m/s
Stroke v: 44 ml
TAPSE: 23.6 mm
Valve area: 2.17 cm2
WEIGHTICAEL: 3037.06 [oz_av]

## 2016-08-25 LAB — GLUCOSE, CAPILLARY
GLUCOSE-CAPILLARY: 124 mg/dL — AB (ref 65–99)
GLUCOSE-CAPILLARY: 178 mg/dL — AB (ref 65–99)
Glucose-Capillary: 113 mg/dL — ABNORMAL HIGH (ref 65–99)
Glucose-Capillary: 129 mg/dL — ABNORMAL HIGH (ref 65–99)
Glucose-Capillary: 162 mg/dL — ABNORMAL HIGH (ref 65–99)

## 2016-08-25 LAB — BASIC METABOLIC PANEL
Anion gap: 8 (ref 5–15)
BUN: 16 mg/dL (ref 6–20)
CHLORIDE: 112 mmol/L — AB (ref 101–111)
CO2: 23 mmol/L (ref 22–32)
CREATININE: 1.03 mg/dL — AB (ref 0.44–1.00)
Calcium: 8.4 mg/dL — ABNORMAL LOW (ref 8.9–10.3)
GFR calc Af Amer: >60 mL/min (ref >60)
GFR calc non Af Amer: 55 mL/min — ABNORMAL LOW (ref >60)
GLUCOSE: 123 mg/dL — AB (ref 65–99)
Potassium: 3.7 mmol/L (ref 3.5–5.1)
SODIUM: 143 mmol/L (ref 135–145)

## 2016-08-25 LAB — CBC
HEMATOCRIT: 29.3 % — AB (ref 36.0–46.0)
Hemoglobin: 8.9 g/dL — ABNORMAL LOW (ref 12.0–15.0)
MCH: 26.2 pg (ref 26.0–34.0)
MCHC: 30.4 g/dL (ref 30.0–36.0)
MCV: 86.2 fL (ref 78.0–100.0)
PLATELETS: 176 10*3/uL (ref 150–400)
RBC: 3.4 MIL/uL — ABNORMAL LOW (ref 3.87–5.11)
RDW: 19 % — AB (ref 11.5–15.5)
WBC: 17.3 10*3/uL — AB (ref 4.0–10.5)

## 2016-08-25 LAB — LACTIC ACID, PLASMA
LACTIC ACID, VENOUS: 2.2 mmol/L — AB (ref 0.5–1.9)
LACTIC ACID, VENOUS: 1.1 mmol/L (ref 0.5–1.9)

## 2016-08-25 IMAGING — CT CT RENAL STONE PROTOCOL
2 of 6 series · 14 of 46 positions shown, 16 images · non-contrast
Comparison: Abdominal CT dated [DATE]

CLINICAL DATA: 65-year-old female with low back pain and dysuria.
History of recent antibiotic treatment.

EXAM:
CT ABDOMEN AND PELVIS WITHOUT CONTRAST
TECHNIQUE: Multidetector CT imaging of the abdomen and pelvis was performed
following the standard protocol without IV contrast.

[Series 2: axial st · axial · 0.77mm/px · z∈[+711,+1106]mm · 11 of 93 slices shown, 13 images]
[im 7/93  soft-tissue]
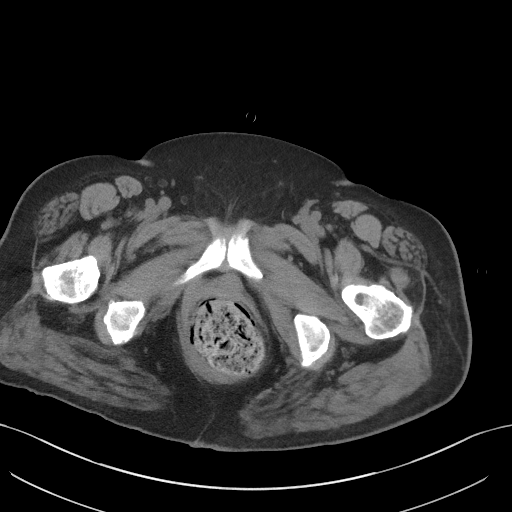
[im 7/93  bone]
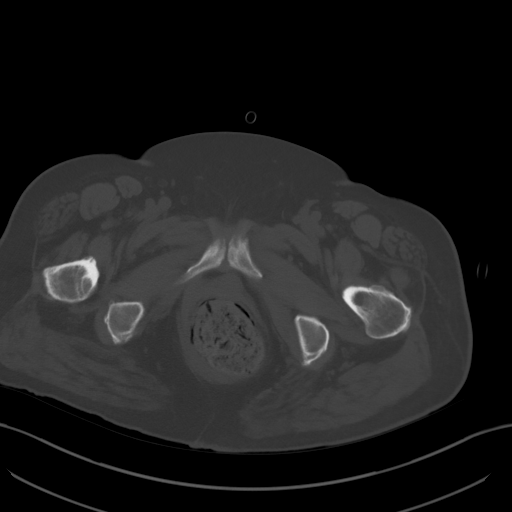
[im 14/93  soft-tissue]
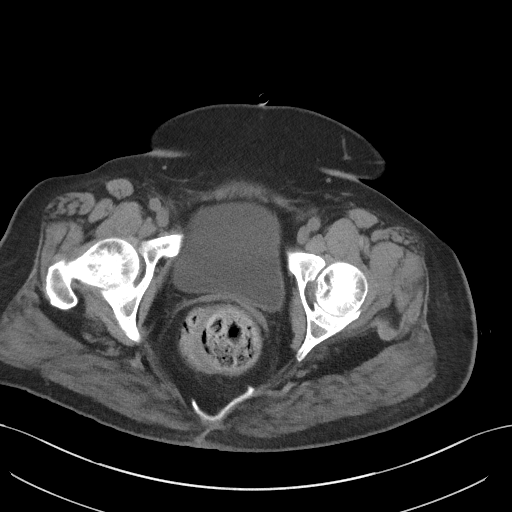
[im 24/93  soft-tissue]
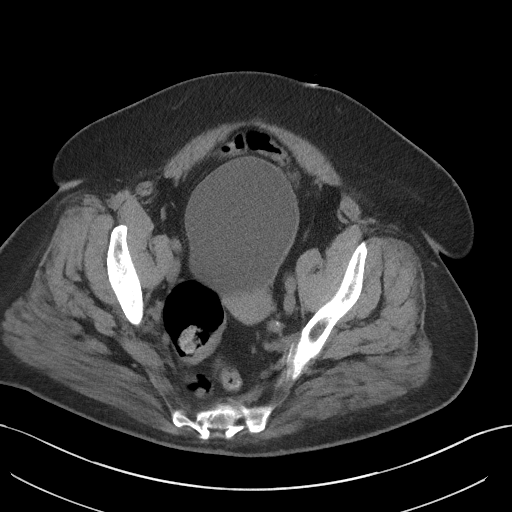
[im 30/93  soft-tissue]
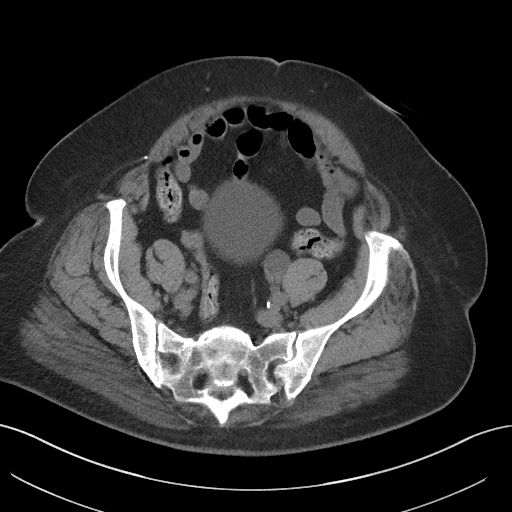
[im 40/93  soft-tissue]
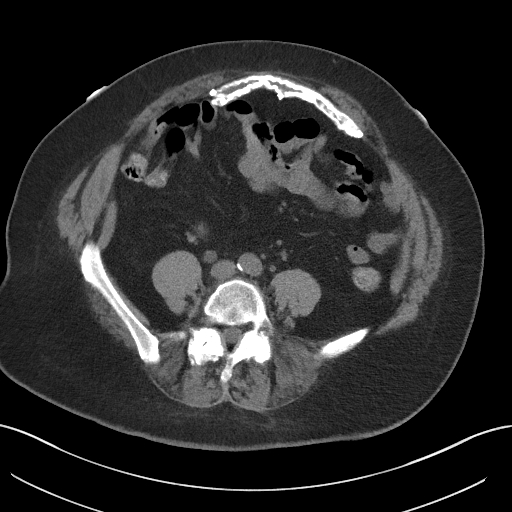
[im 47/93  soft-tissue]
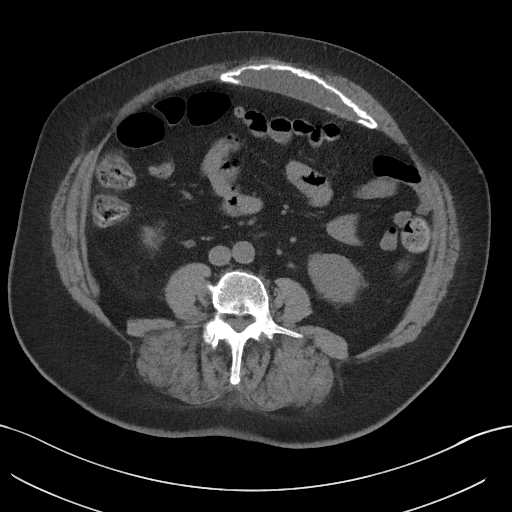
[im 53/93  soft-tissue]
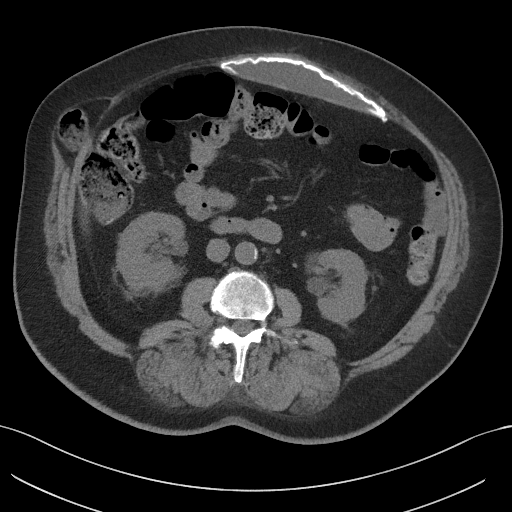
[im 63/93  soft-tissue]
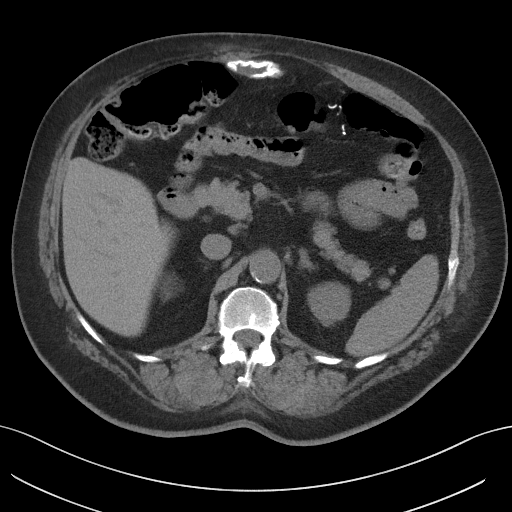
[im 70/93  soft-tissue]
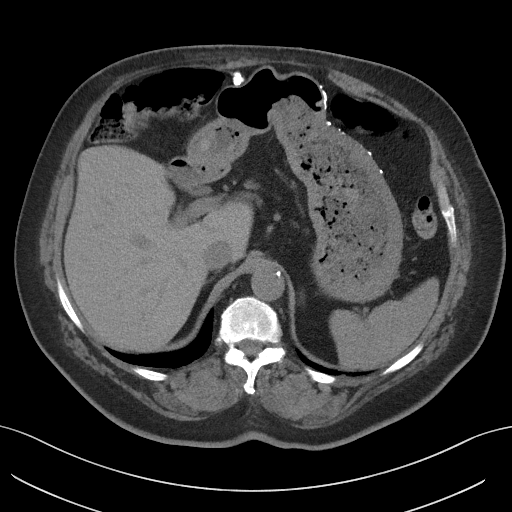
[im 70/93  bone]
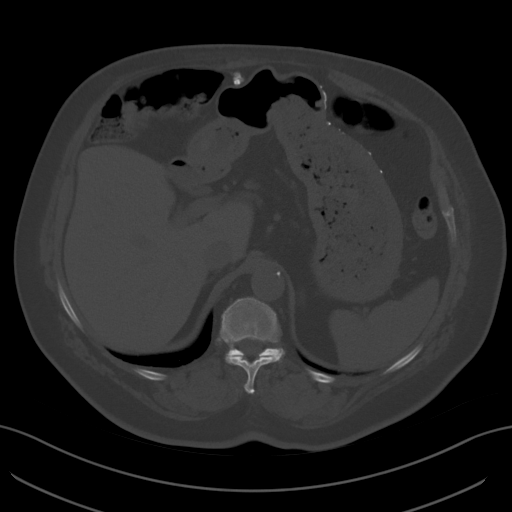
[im 79/93  soft-tissue]
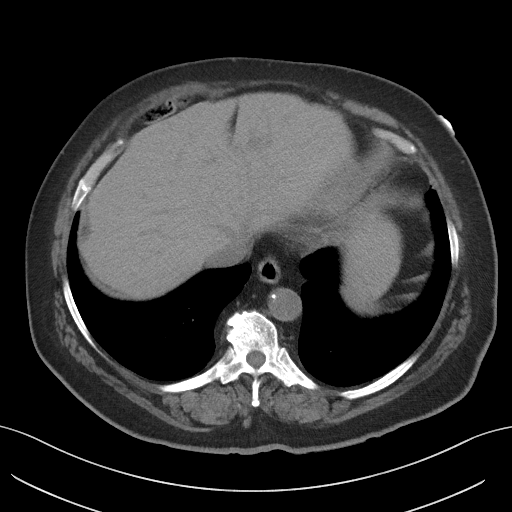
[im 86/93  soft-tissue]
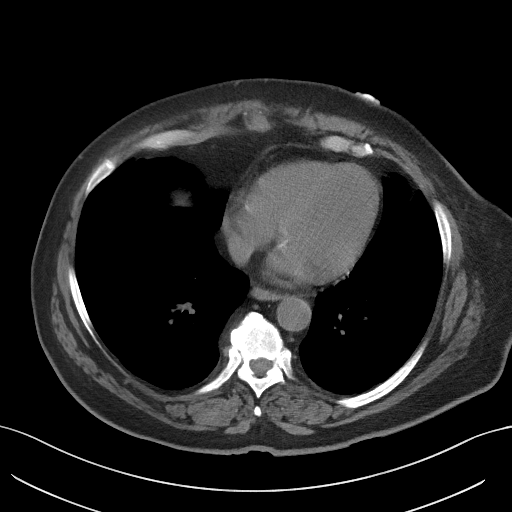

[Series 6: coronal st · coronal · 0.78mm/px · 3 of 101 slices shown]
[im 26/101  soft-tissue]
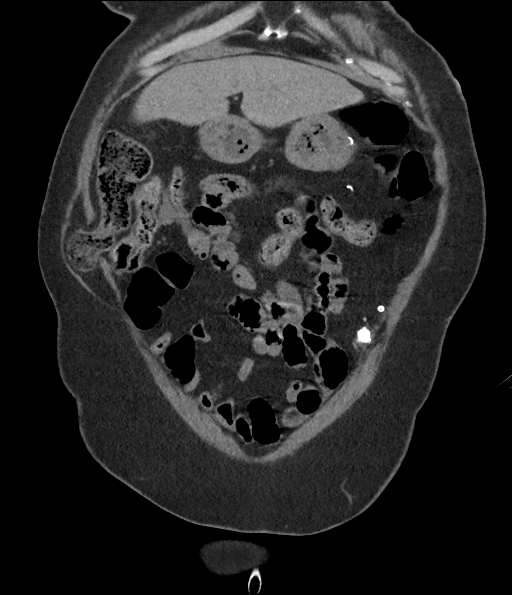
[im 51/101  soft-tissue]
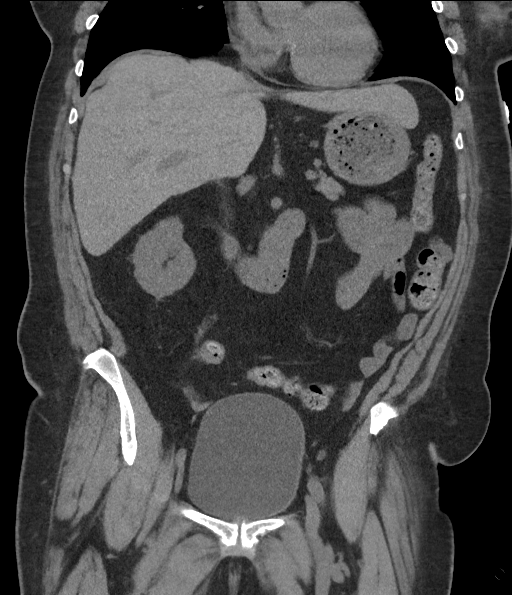
[im 76/101  soft-tissue]
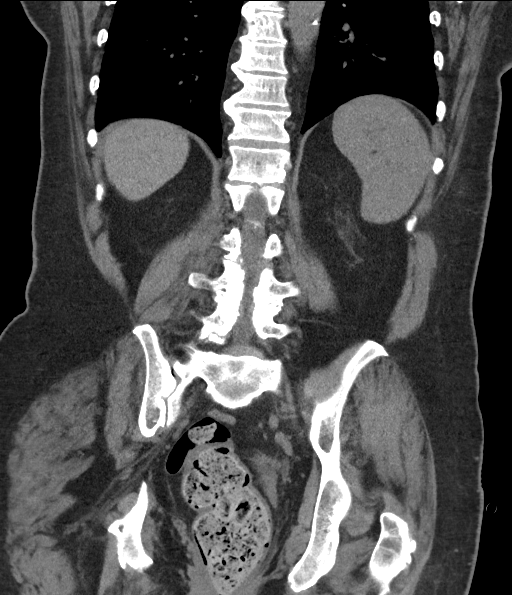

[14 of 46 positions shown; findings below may reference images not displayed]

FINDINGS: Evaluation of this exam is limited in the absence of intravenous
contrast.

Lower chest: The visualized lung bases are clear. Mild coronary
vascular calcification noted. There is hypoattenuation of the
cardiac blood pool suggestive of a degree of anemia. Clinical
correlation is recommended.

No intra-abdominal free air or free fluid.

Hepatobiliary: Cholecystectomy. The liver is unremarkable. No
intrahepatic biliary ductal dilatation.

Pancreas: Unremarkable. No pancreatic ductal dilatation or
surrounding inflammatory changes.

Spleen: Normal in size without focal abnormality.

Adrenals/Urinary Tract: The adrenal glands are unremarkable. Mild
bilateral renal parenchyma atrophy. There bilateral renal cortical
irregularities, likely related to chronic infection and infarct.
Multiple small nonobstructing left renal inferior pole stones
measure up to 4 mm. No hydronephrosis. There is no hydronephrosis or
nephrolithiasis on the right. The visualized ureters and urinary
bladder appear unremarkable.

Stomach/Bowel: Surgical clips noted along the greater curvature of
the stomach. There is a moderate amount of stool throughout the
colon. Large dense stool noted within the rectal vault which may
represent a degree of fecal impaction. There is no evidence of bowel
obstruction or active inflammation. The appendix is not visualized
with certainty, likely surgically absent. No inflammatory changes or
secondary signs of appendicitis. There is herniation of a short
segment of the ascending colon through the right lateral abdominal
wall defect inferior to the liver. No evidence of inflammatory
changes or obstruction. There is slight prolapse of the rectum.
There is diffuse thickening of the perirectal and perianal
subcutaneous soft tissues extending into the intergluteal fissure.
This likely represents a combination of acute on chronic
inflammatory changes and scarring. Correlation with clinical exam
recommended. No drainable fluid collection or abscess identified.

Vascular/Lymphatic: There is moderate aortoiliac atherosclerotic
disease. No aneurysmal dilatation. The IVC is grossly unremarkable.
No portal venous gas noted. Evaluation of the vasculature is limited
in the absence of intravenous contrast. There is no adenopathy.

Reproductive: The uterus is grossly unremarkable. There is a 1.7 cm
left ovarian cyst. This is similar to the prior CT. Pelvic
ultrasound may provide better characterisation. The right ovary
appears unremarkable.

Other: There is a ventral hernia repair mesh with colonic fluid
collection and calcified capsule similar to prior CT. A coccygeal
mesh seen similar to the prior CTs and likely suggestive of pelvic
floor laxity repair.

Musculoskeletal: Degenerative changes of the spine. No acute
fracture.
IMPRESSION: 1. Circumferential thickening of the perirectal and perianal
subcutaneous soft tissues, likely combination of acute and
recurrent/chronic inflammation or infection. Correlation with
clinical exam recommended. There is no drainable fluid collection or
abscess.
2. Small left renal calculi. No hydronephrosis or obstructing stone.
3. No evidence of bowel obstruction or active inflammation. Moderate
colonic stool burden with possible fecal impaction in the rectum.
4. Stable small right lateral abdominal wall hernia containing a
short segment of the ascending colon without evidence of obstruction
or inflammation.
5. Ventral hernia repair mesh with stable chronic changes/fluid and
calcified capsule. A coccygeal mesh is also seen similar to the
prior CTs and likely related to repair of pelvic floor laxity.

## 2016-08-25 MED ORDER — ENSURE ENLIVE PO LIQD: 237.0000 mL | Freq: Two times a day (BID) | ORAL | Status: DC

## 2016-08-25 MED ORDER — INSULIN ASPART PROT & ASPART (70-30 MIX) 100 UNIT/ML ~~LOC~~ SUSP
30.0000 [IU] | Freq: Two times a day (BID) | SUBCUTANEOUS | Status: DC
  Filled 2016-08-25: qty 10

## 2016-08-25 MED ORDER — INSULIN ASPART PROT & ASPART (70-30 MIX) 100 UNIT/ML PEN
30.0000 [IU] | PEN_INJECTOR | Freq: Two times a day (BID) | SUBCUTANEOUS | Status: DC
  Filled 2016-08-25: qty 3

## 2016-08-25 MED ORDER — ONDANSETRON HCL 4 MG PO TABS: 8.0000 mg | ORAL_TABLET | Freq: Two times a day (BID) | ORAL | Status: DC | PRN

## 2016-08-25 MED ORDER — SODIUM CHLORIDE 0.9 % IV SOLN: 250.0000 mL | INTRAVENOUS | Status: DC | PRN

## 2016-08-25 MED ORDER — DEXTROSE 5 % IV SOLN
INTRAVENOUS | Status: AC
  Filled 2016-08-25: qty 1

## 2016-08-25 MED ORDER — LACTATED RINGERS IV SOLN
INTRAVENOUS | Status: DC
  Administered 2016-08-25 – 2016-08-26 (×2): via INTRAVENOUS

## 2016-08-25 MED ORDER — HYDROCODONE-ACETAMINOPHEN 5-325 MG PO TABS
1.0000 | ORAL_TABLET | Freq: Four times a day (QID) | ORAL | Status: DC | PRN
  Administered 2016-08-25 – 2016-08-26 (×4): 1 via ORAL
  Filled 2016-08-25 (×4): qty 1

## 2016-08-25 MED ORDER — INSULIN ASPART 100 UNIT/ML ~~LOC~~ SOLN
0.0000 [IU] | Freq: Three times a day (TID) | SUBCUTANEOUS | Status: DC
  Administered 2016-08-25 (×2): 2 [IU] via SUBCUTANEOUS
  Administered 2016-08-25 – 2016-08-26 (×2): 1 [IU] via SUBCUTANEOUS
  Administered 2016-08-26: 3 [IU] via SUBCUTANEOUS

## 2016-08-25 MED ORDER — CITALOPRAM HYDROBROMIDE 20 MG PO TABS
10.0000 mg | ORAL_TABLET | Freq: Every day | ORAL | Status: DC
Start: 2016-08-25 — End: 2016-08-26
  Administered 2016-08-25 – 2016-08-26 (×2): 10 mg via ORAL
  Filled 2016-08-25 (×2): qty 1

## 2016-08-25 MED ORDER — ALLOPURINOL 100 MG PO TABS
100.0000 mg | ORAL_TABLET | Freq: Every day | ORAL | Status: DC
  Administered 2016-08-25 – 2016-08-26 (×2): 100 mg via ORAL
  Filled 2016-08-25 (×2): qty 1

## 2016-08-25 MED ORDER — INSULIN ASPART PROT & ASPART (70-30 MIX) 100 UNIT/ML ~~LOC~~ SUSP
20.0000 [IU] | Freq: Two times a day (BID) | SUBCUTANEOUS | Status: DC
  Administered 2016-08-25 – 2016-08-26 (×2): 20 [IU] via SUBCUTANEOUS

## 2016-08-25 MED ORDER — CHLORPROMAZINE HCL 100 MG PO TABS
100.0000 mg | ORAL_TABLET | Freq: Every day | ORAL | Status: DC
  Administered 2016-08-25 (×2): 100 mg via ORAL
  Filled 2016-08-25 (×2): qty 1

## 2016-08-25 MED ORDER — SALINE SPRAY 0.65 % NA SOLN
1.0000 | NASAL | Status: DC | PRN
  Administered 2016-08-25: 1 via NASAL
  Filled 2016-08-25: qty 44

## 2016-08-25 MED ORDER — SODIUM CHLORIDE 0.9% FLUSH
3.0000 mL | Freq: Two times a day (BID) | INTRAVENOUS | Status: DC
  Administered 2016-08-25 (×2): 3 mL via INTRAVENOUS

## 2016-08-25 MED ORDER — LORAZEPAM 0.5 MG PO TABS: 0.5000 mg | ORAL_TABLET | Freq: Four times a day (QID) | ORAL | Status: DC | PRN

## 2016-08-25 MED ORDER — ASPIRIN EC 81 MG PO TBEC
81.0000 mg | DELAYED_RELEASE_TABLET | Freq: Every day | ORAL | Status: DC
Start: 2016-08-25 — End: 2016-08-26
  Administered 2016-08-25 – 2016-08-26 (×2): 81 mg via ORAL
  Filled 2016-08-25 (×2): qty 1

## 2016-08-25 MED ORDER — PROCHLORPERAZINE MALEATE 5 MG PO TABS: 10.0000 mg | ORAL_TABLET | Freq: Four times a day (QID) | ORAL | Status: DC | PRN

## 2016-08-25 MED ORDER — FENTANYL CITRATE (PF) 100 MCG/2ML IJ SOLN
50.0000 ug | INTRAMUSCULAR | Status: DC | PRN
  Administered 2016-08-25 (×2): 50 ug via INTRAVENOUS
  Filled 2016-08-25 (×2): qty 2

## 2016-08-25 MED ORDER — MIRTAZAPINE 15 MG PO TABS
15.0000 mg | ORAL_TABLET | Freq: Every day | ORAL | Status: DC
  Administered 2016-08-25 (×2): 15 mg via ORAL
  Filled 2016-08-25 (×2): qty 1

## 2016-08-25 MED ORDER — SODIUM CHLORIDE 0.9% FLUSH: 3.0000 mL | INTRAVENOUS | Status: DC | PRN

## 2016-08-25 MED ORDER — LEVOTHYROXINE SODIUM 50 MCG PO TABS
50.0000 ug | ORAL_TABLET | Freq: Every day | ORAL | Status: DC
  Administered 2016-08-25 – 2016-08-26 (×2): 50 ug via ORAL
  Filled 2016-08-25 (×2): qty 1

## 2016-08-25 NOTE — Progress Notes (Signed)
Nutrition Brief Note  Patient identified on the Malnutrition Screening Tool (MST) Report  Wt Readings from Last 15 Encounters:  08/25/16 189 lb 13.1 oz (86.1 kg)  08/09/16 185 lb 6.4 oz (84.1 kg)  07/19/16 190 lb 1.6 oz (86.2 kg)  06/29/16 195 lb (88.5 kg)  06/28/16 194 lb (88 kg)  06/22/16 195 lb 1.7 oz (88.5 kg)  06/08/16 195 lb 12.3 oz (88.8 kg)  06/07/16 193 lb 8 oz (87.8 kg)  05/31/16 196 lb 4.8 oz (89 kg)  05/26/16 194 lb (88 kg)  05/17/16 194 lb 1.6 oz (88 kg)  05/07/16 196 lb 9.6 oz (89.2 kg)  04/16/16 205 lb 4 oz (93.1 kg)  04/12/16 200 lb (90.7 kg)  08/02/12 190 lb (86.2 kg)   Body mass index is 32.58 kg/m. Patient meets criteria for Obese based on current BMI.   Patient had stated on MST that she had lost weight without trying.   Pt is a breast cancer patient which was diagnosed this past February. Was 200-205 lbs at that time. She had just recently finished chemo and is about to start radiation. She says she tolerated chemo well and never had a problem with her PO intake. Per chart, she actually appears to have regained 5 lbs in the past 3 weeks.   Currently has no nutrition related complaints. No n/v/c/d.   Current diet order is HH/Carb Mod, patient is consuming approximately 75-100% of meals at this time. Labs and medications reviewed.   No nutrition interventions warranted at this time. If nutrition issues arise, please consult RD.   Burtis Junes RD, LDN, CNSC Clinical Nutrition Pager: [phone] 08/25/2016 4:59 PM

## 2016-08-25 NOTE — Progress Notes (Signed)
Pharmacy Antibiotic Note  Margaret Mcdaniel is a 66 y.o. female admitted on 08/24/2016 with sepsis.  Pharmacy has been consulted forAztreonam dosing.  Plan: Aztreonam 2gm IV given in ED, then 1gm IV q8h F/U cxs and clinical progress  Height: 5\' 4"  (162.6 cm) Weight: 189 lb 13.1 oz (86.1 kg) IBW/kg (Calculated) : 54.7  Temp (24hrs), Avg:99.2 F (37.3 C), Min:98.1 F (36.7 C), Max:100 F (37.8 C)   Recent Labs Lab 08/24/16 2012 08/24/16 2019 08/24/16 2045 08/25/16 0124 08/25/16 0422  WBC 18.0*  --   --   --  17.3*  CREATININE  --   --  1.18*  --  1.03*  LATICACIDVEN  --  3.63*  --  2.2* 1.1    Estimated Creatinine Clearance: 56.3 mL/min (A) (by C-G formula based on SCr of 1.03 mg/dL (H)).    Allergies  Allergen Reactions  . Keflex [Cephalexin] Anaphylaxis    Pt "codes" on this medication.  . Sulfa Antibiotics Hives  . Latex Rash    Antimicrobials this admission: Aztreonam 6/19 >>  Vancomycin 750mg  IV and Levaquin 750mg  IV x 1 dose in ED 6/20   Dose adjustments this admission: N/A  Microbiology results: 6/19 BCx: pending 6/19 UCx: pending  Thank you for allowing pharmacy to be a part of this patient's care.  Isac Sarna, BS Pharm D, BCPS Clinical Pharmacist Pager [phone] 08/25/2016 8:20 AM

## 2016-08-25 NOTE — ED Provider Notes (Signed)
[facility] DEPT Provider Note   CSN: [id number] Arrival date & time: 08/24/16  1940     History   Chief Complaint Chief Complaint  Patient presents with  . Fever    HPI Margaret Mcdaniel is a 66 y.o. female.   Fever   This is a new problem. The current episode started more than 2 days ago. The problem occurs constantly. The problem has not changed since onset.The maximum temperature noted was 101 to 101.9 F. Pertinent negatives include no chest pain. She has tried nothing for the symptoms.    Past Medical History:  Diagnosis Date  . Anxiety   . Arthritis   . Breast cancer metastasized to axillary lymph node, left ([place]) 04/14/2016  . Cancer ([place])    left breast  . CKD (chronic kidney disease) stage 3, GFR 30-59 ml/min 06/08/2016  . COPD (chronic obstructive pulmonary disease) ([place])   . Diabetes mellitus without complication ([place])   . GERD (gastroesophageal reflux disease)   . History of kidney stones   . Hypothyroidism   . Mental disorder   . Neuromuscular disorder ([place])   . PONV (postoperative nausea and vomiting)   . RLS (restless legs syndrome) 06/29/2016  . Secondary Parkinson disease ([place]) 06/29/2016  . Sleep apnea    Uses CPAP  . Uncontrolled type 2 diabetes mellitus with hyperglycemia, with long-term current use of insulin ([place]) 06/08/2016    Patient Active Problem List   Diagnosis Date Noted  . Encephalopathy 08/25/2016  . COPD (chronic obstructive pulmonary disease) ([place])   . Secondary Parkinson disease ([place]) 06/29/2016  . RLS (restless legs syndrome) 06/29/2016  . Fever 06/21/2016  . Pressure injury of skin 06/13/2016  . Pyelonephritis 06/08/2016  . Hypothyroidism 06/08/2016  . Sepsis ([place]) 06/08/2016  . CKD (chronic kidney disease) stage 3, GFR 30-59 ml/min 06/08/2016  . Uncontrolled type 2 diabetes mellitus with hyperglycemia, with long-term current use of insulin ([place]) 06/08/2016  . Breast cancer metastasized to axillary lymph node, left ([facility])  04/14/2016    Past Surgical History:  Procedure Laterality Date  . ANKLE SURGERY Right    fusion  . APPENDECTOMY    . CHOLECYSTECTOMY    . EXPLORATORY LAPAROTOMY    . HEMORRHOID SURGERY    . MASTECTOMY MODIFIED RADICAL Left 04/14/2016   Procedure: LEFT MODIFIED RADICAL MASTECTOMY;  Surgeon: Aviva Signs, MD;  Location: [facility];  Service: General;  Laterality: Left;  . PORTACATH PLACEMENT N/A 05/26/2016   Procedure: INSERTION PORT-A-CATH RIGHT SUBCLAVIAN AND  DRAINAGE OF SEROMA LEFT CHEST;  Surgeon: Aviva Signs, MD;  Location: [facility];  Service: General;  Laterality: N/A;  . ROTATOR CUFF REPAIR    . TONSILLECTOMY    . TUBAL LIGATION      OB History    No data available       Home Medications    Prior to Admission medications   Medication Sig Start Date End Date Taking? Authorizing Provider  allopurinol (ZYLOPRIM) 100 MG tablet Take 100 mg by mouth daily.   Yes [provider]  aspirin EC 81 MG tablet Take 81 mg by mouth daily.   Yes [provider]  carbidopa-levodopa (SINEMET IR) 25-100 MG tablet 1/2 tablet in the morning and midday, and one at night 06/29/16  Yes Kathrynn Ducking, MD  chlorproMAZINE (THORAZINE) 50 MG tablet Take 100 mg by mouth at bedtime.    Yes [provider]  citalopram (CELEXA) 10 MG tablet Take 10 mg by mouth daily. 08/18/16  Yes [provider]  HYDROcodone-acetaminophen (NORCO) 5-325 MG tablet Take 1 tablet by mouth every 6 (six) hours as needed for moderate pain. 04/16/16  Yes Aviva Signs, MD  insulin degludec (TRESIBA FLEXTOUCH) 100 UNIT/ML SOPN FlexTouch Pen Inject 50 Units into the skin at bedtime.    Yes [provider]  levofloxacin (LEVAQUIN) 500 MG tablet Take 500 mg by mouth daily. 7 days starting on 08/24/2016   Yes [provider]  levothyroxine (SYNTHROID, LEVOTHROID) 50 MCG tablet Take 50 mcg by mouth daily before breakfast.   Yes [provider]  lidocaine-prilocaine (EMLA) cream  Apply to affected area once Patient taking differently: Apply 1 application topically as directed. Apply to affected area once 05/17/16  Yes Twana First, MD  LORazepam (ATIVAN) 0.5 MG tablet Take 1 tablet (0.5 mg total) by mouth every 6 (six) hours as needed (Nausea or vomiting). 05/17/16  Yes Twana First, MD  mirtazapine (REMERON) 15 MG tablet Take 15 mg by mouth at bedtime.   Yes [provider]  NOVOLOG MIX 70/30 FLEXPEN (70-30) 100 UNIT/ML FlexPen Inject 30 Units into the skin 2 (two) times daily with a meal.  06/08/16  Yes [provider]  omeprazole (PRILOSEC) 40 MG capsule Take 40 mg by mouth daily.   Yes [provider]  ondansetron (ZOFRAN) 8 MG tablet Take 1 tablet (8 mg total) by mouth 2 (two) times daily as needed for refractory nausea / vomiting. Start on day 3 after chemo. 05/17/16  Yes Twana First, MD  polyethylene glycol powder (GLYCOLAX/MIRALAX) powder Take 17 g by mouth daily.  04/03/16  Yes [provider]  prochlorperazine (COMPAZINE) 10 MG tablet Take 1 tablet (10 mg total) by mouth every 6 (six) hours as needed (Nausea or vomiting). 05/17/16  Yes Twana First, MD  simvastatin (ZOCOR) 40 MG tablet Take 40 mg by mouth at bedtime.   Yes [provider]  temazepam (RESTORIL) 15 MG capsule Take 15 mg by mouth at bedtime.   Yes [provider]  tiZANidine (ZANAFLEX) 4 MG tablet Take 8 mg by mouth 2 (two) times daily.   Yes [provider]  dexamethasone (DECADRON) 4 MG tablet Take 2 tablets (8 mg total) by mouth 2 (two) times daily. Start the day before Taxotere. Then again the day after chemo for 3 days. Patient not taking: Reported on 08/24/2016 05/17/16   Twana First, MD  DOCEtaxel (TAXOTERE IV) Inject into the vein. Every 3 weeks    [provider]  levofloxacin (LEVAQUIN) 750 MG tablet  06/14/16   [provider]    Family History Family History  Problem Relation Age of Onset  . Heart disease Mother     . Bipolar disorder Father   . Heart disease Father   . Prostate cancer Brother     Social History Social History  Substance Use Topics  . Smoking status: Former Smoker    Packs/day: 1.00    Years: 50.00    Types: Cigarettes    Quit date: 04/12/2014  . Smokeless tobacco: Never Used  . Alcohol use No     Allergies   Keflex [cephalexin]; Sulfa antibiotics; and Latex   Review of Systems Review of Systems  Constitutional: Positive for fever.  Cardiovascular: Negative for chest pain.  All other systems reviewed and are negative.    Physical Exam Updated Vital Signs BP 136/69 (BP Location: Right Arm)   Pulse 80   Temp 99 F (37.2 C) (Oral)   Resp 18  Ht 5\' 4"  (1.626 m)   Wt 86.1 kg (189 lb 13.1 oz)   SpO2 96%   BMI 32.58 kg/m   Physical Exam  Constitutional: She is oriented to person, place, and time. She appears well-developed and well-nourished.  HENT:  Head: Normocephalic and atraumatic.  Eyes: Conjunctivae and EOM are normal.  Neck: Normal range of motion.  Cardiovascular: Normal rate and regular rhythm.   Pulmonary/Chest: No stridor. No respiratory distress.  Abdominal: Soft. She exhibits no distension.  Neurological: She is alert and oriented to person, place, and time.  Mild tremor  Skin: Skin is warm and dry.  Nursing note and vitals reviewed.    ED Treatments / Results  Labs (all labs ordered are listed, but only abnormal results are displayed) Labs Reviewed  CBC WITH DIFFERENTIAL/PLATELET - Abnormal; Notable for the following:       Result Value   WBC 18.0 (*)    RBC 3.80 (*)    Hemoglobin 10.2 (*)    HCT 32.2 (*)    RDW 19.2 (*)    Neutro Abs 13.3 (*)    Monocytes Absolute 1.1 (*)    All other components within normal limits  URINALYSIS, ROUTINE W REFLEX MICROSCOPIC - Abnormal; Notable for the following:    APPearance HAZY (*)    Hgb urine dipstick MODERATE (*)    Leukocytes, UA SMALL (*)    Bacteria, UA RARE (*)    Squamous  Epithelial / LPF 0-5 (*)    Non Squamous Epithelial 0-5 (*)    Crystals PRESENT (*)    All other components within normal limits  ACETAMINOPHEN LEVEL - Abnormal; Notable for the following:    Acetaminophen (Tylenol), Serum <10 (*)    All other components within normal limits  BASIC METABOLIC PANEL - Abnormal; Notable for the following:    Chloride 112 (*)    Glucose, Bld 123 (*)    Creatinine, Ser 1.03 (*)    Calcium 8.4 (*)    GFR calc non Af Amer 55 (*)    All other components within normal limits  COMPREHENSIVE METABOLIC PANEL - Abnormal; Notable for the following:    CO2 21 (*)    Glucose, Bld 153 (*)    Creatinine, Ser 1.18 (*)    Calcium 8.5 (*)    Albumin 3.1 (*)    GFR calc non Af Amer 47 (*)    GFR calc Af Amer 54 (*)    All other components within normal limits  LACTIC ACID, PLASMA - Abnormal; Notable for the following:    Lactic Acid, Venous 2.2 (*)    All other components within normal limits  GLUCOSE, CAPILLARY - Abnormal; Notable for the following:    Glucose-Capillary 113 (*)    All other components within normal limits  CBC - Abnormal; Notable for the following:    WBC 17.3 (*)    RBC 3.40 (*)    Hemoglobin 8.9 (*)    HCT 29.3 (*)    RDW 19.0 (*)    All other components within normal limits  GLUCOSE, CAPILLARY - Abnormal; Notable for the following:    Glucose-Capillary 124 (*)    All other components within normal limits  GLUCOSE, CAPILLARY - Abnormal; Notable for the following:    Glucose-Capillary 178 (*)    All other components within normal limits  GLUCOSE, CAPILLARY - Abnormal; Notable for the following:    Glucose-Capillary 162 (*)    All other components within normal limits  I-STAT  CG4 LACTIC ACID, ED - Abnormal; Notable for the following:    Lactic Acid, Venous 3.63 (*)    All other components within normal limits  CULTURE, BLOOD (ROUTINE X 2)  CULTURE, BLOOD (ROUTINE X 2)  URINE CULTURE  PROTIME-INR  LACTIC ACID, PLASMA  CBC WITH  DIFFERENTIAL/PLATELET  BASIC METABOLIC PANEL    EKG  EKG Interpretation None       Radiology Dg Chest 2 View  Result Date: 08/24/2016 CLINICAL DATA:  Initial evaluation for acute fever, back pain. EXAM: CHEST  2 VIEW COMPARISON:  Prior radiograph from 06/21/2016. FINDINGS: Right-sided Port-A-Cath in place. Moderate cardiomegaly, stable. Mediastinal silhouette normal. Aortic atherosclerosis. Lungs normally inflated. No focal infiltrates. No pulmonary edema or pleural effusion. No pneumothorax. No acute osseous abnormality. Surgical clips overlie the left axilla. IMPRESSION: 1. No radiographic evidence for active cardiopulmonary disease. 2. Stable cardiomegaly without pulmonary edema. 3. Aortic atherosclerosis. Electronically Signed   By: Jeannine Boga M.D.   On: 08/24/2016 22:25   Ct Renal Stone Study  Result Date: 08/25/2016 CLINICAL DATA:  66 year old female with low back pain and dysuria. History of recent antibiotic treatment. EXAM: CT ABDOMEN AND PELVIS WITHOUT CONTRAST TECHNIQUE: Multidetector CT imaging of the abdomen and pelvis was performed following the standard protocol without IV contrast. COMPARISON:  Abdominal CT dated 06/21/2016 FINDINGS: Evaluation of this exam is limited in the absence of intravenous contrast. Lower chest: The visualized lung bases are clear. Mild coronary vascular calcification noted. There is hypoattenuation of the cardiac blood pool suggestive of a degree of anemia. Clinical correlation is recommended. No intra-abdominal free air or free fluid. Hepatobiliary: Cholecystectomy. The liver is unremarkable. No intrahepatic biliary ductal dilatation. Pancreas: Unremarkable. No pancreatic ductal dilatation or surrounding inflammatory changes. Spleen: Normal in size without focal abnormality. Adrenals/Urinary Tract: The adrenal glands are unremarkable. Mild bilateral renal parenchyma atrophy. There bilateral renal cortical irregularities, likely related to chronic  infection and infarct. Multiple small nonobstructing left renal inferior pole stones measure up to 4 mm. No hydronephrosis. There is no hydronephrosis or nephrolithiasis on the right. The visualized ureters and urinary bladder appear unremarkable. Stomach/Bowel: Surgical clips noted along the greater curvature of the stomach. There is a moderate amount of stool throughout the colon. Large dense stool noted within the rectal vault which may represent a degree of fecal impaction. There is no evidence of bowel obstruction or active inflammation. The appendix is not visualized with certainty, likely surgically absent. No inflammatory changes or secondary signs of appendicitis. There is herniation of a short segment of the ascending colon through the right lateral abdominal wall defect inferior to the liver. No evidence of inflammatory changes or obstruction. There is slight prolapse of the rectum. There is diffuse thickening of the perirectal and perianal subcutaneous soft tissues extending into the intergluteal fissure. This likely represents a combination of acute on chronic inflammatory changes and scarring. Correlation with clinical exam recommended. No drainable fluid collection or abscess identified. Vascular/Lymphatic: There is moderate aortoiliac atherosclerotic disease. No aneurysmal dilatation. The IVC is grossly unremarkable. No portal venous gas noted. Evaluation of the vasculature is limited in the absence of intravenous contrast. There is no adenopathy. Reproductive: The uterus is grossly unremarkable. There is a 1.7 cm left ovarian cyst. This is similar to the prior CT. Pelvic ultrasound may provide better characterisation. The right ovary appears unremarkable. Other: There is a ventral hernia repair mesh with colonic fluid collection and calcified capsule similar to prior CT. A coccygeal mesh seen similar to the  prior CTs and likely suggestive of pelvic floor laxity repair. Musculoskeletal: Degenerative  changes of the spine. No acute fracture. IMPRESSION: 1. Circumferential thickening of the perirectal and perianal subcutaneous soft tissues, likely combination of acute and recurrent/chronic inflammation or infection. Correlation with clinical exam recommended. There is no drainable fluid collection or abscess. 2. Small left renal calculi. No hydronephrosis or obstructing stone. 3. No evidence of bowel obstruction or active inflammation. Moderate colonic stool burden with possible fecal impaction in the rectum. 4. Stable small right lateral abdominal wall hernia containing a short segment of the ascending colon without evidence of obstruction or inflammation. 5. Ventral hernia repair mesh with stable chronic changes/fluid and calcified capsule. A coccygeal mesh is also seen similar to the prior CTs and likely related to repair of pelvic floor laxity. Electronically Signed   By: Anner Crete M.D.   On: 08/25/2016 01:00    Procedures Procedures (including critical care time)  Medications Ordered in ED Medications  aztreonam (AZACTAM) 1 g in dextrose 5 % 50 mL IVPB (0 g Intravenous Stopped 08/25/16 1713)  citalopram (CELEXA) tablet 10 mg (10 mg Oral Given 08/25/16 0949)  ondansetron (ZOFRAN) tablet 8 mg (not administered)  prochlorperazine (COMPAZINE) tablet 10 mg (not administered)  allopurinol (ZYLOPRIM) tablet 100 mg (100 mg Oral Given 08/25/16 0948)  chlorproMAZINE (THORAZINE) tablet 100 mg (100 mg Oral Given 08/25/16 0122)  HYDROcodone-acetaminophen (NORCO/VICODIN) 5-325 MG per tablet 1 tablet (1 tablet Oral Given 08/25/16 0949)  levothyroxine (SYNTHROID, LEVOTHROID) tablet 50 mcg (50 mcg Oral Given 08/25/16 0948)  mirtazapine (REMERON) tablet 15 mg (15 mg Oral Given 08/25/16 0122)  aspirin EC tablet 81 mg (81 mg Oral Given 08/25/16 0948)  sodium chloride flush (NS) 0.9 % injection 3 mL (3 mLs Intravenous Given 08/25/16 0956)  sodium chloride flush (NS) 0.9 % injection 3 mL (not administered)  0.9 %   sodium chloride infusion (not administered)  insulin aspart (novoLOG) injection 0-9 Units (2 Units Subcutaneous Given 08/25/16 1643)  fentaNYL (SUBLIMAZE) injection 50 mcg (50 mcg Intravenous Given 08/25/16 1148)  sodium chloride (OCEAN) 0.65 % nasal spray 1 spray (1 spray Each Nare Given 08/25/16 0514)  lactated ringers infusion ( Intravenous New Bag/Given 08/25/16 0955)  insulin aspart protamine- aspart (NOVOLOG MIX 70/30) injection 20 Units (20 Units Subcutaneous Given 08/25/16 1641)  levofloxacin (LEVAQUIN) IVPB 750 mg (0 mg Intravenous Stopped 08/25/16 0052)  aztreonam (AZACTAM) 2 g in dextrose 5 % 50 mL IVPB (0 g Intravenous Stopped 08/25/16 0052)  vancomycin (VANCOCIN) IVPB 1000 mg/200 mL premix (0 mg Intravenous Stopped 08/25/16 0011)  sodium chloride 0.9 % bolus 2,000 mL (0 mLs Intravenous Stopped 08/24/16 2223)  sodium chloride 0.9 % bolus 500 mL (0 mLs Intravenous Stopped 08/24/16 2306)  fentaNYL (SUBLIMAZE) injection 50 mcg (50 mcg Intravenous Given 08/24/16 2053)     Initial Impression / Assessment and Plan / ED Course  I have reviewed the triage vital signs and the nursing notes.  Pertinent labs & imaging results that were available during my care of the patient were reviewed by me and considered in my medical decision making (see chart for details).     Suspect persistent UTI vs early pyelonephritis as cause for septic picture. abx given. Urine with bacteria/wbc. Plan for admission for failure of outpatient antibiotics and further workup/management.   Final Clinical Impressions(s) / ED Diagnoses   Final diagnoses:  Chronic obstructive pulmonary disease, unspecified COPD type ([place])  Pain      Dorenda Pfannenstiel, Corene Cornea, MD 08/25/16 1857

## 2016-08-25 NOTE — Care Management Obs Status (Signed)
[facility] NOTIFICATION   Patient Details  Name: NAJMO PARDUE MRN: [id number] Date of Birth: [birth year]   Medicare Observation Status Notification Given:  Yes    Sherald Barge, RN 08/25/2016, 9:29 AM

## 2016-08-25 NOTE — Evaluation (Signed)
Physical Therapy Evaluation Patient Details Name: Margaret Mcdaniel MRN: [id number] DOB: 13-May-1950 Today's Date: 08/25/2016   History of Present Illness  Margaret Mcdaniel is a 66yo white female who comes to [facility] on 6/19 with increased bilat low back pain at flanks, and fever. Pt aditted for pyelonephritis. PMH: BrCA with Left breast and axillary metastasis now s/p radiation, CKD3, IDDM, COPD, kidney stones, OSA, 2* parkinsonism.   Clinical Impression  Pt admitted with above diagnosis. Pt currently with functional limitations due to the deficits listed below (see "PT Problem List"). Upon entry, the patient is received semirecumbent in bed, no family/caregiver present. The pt is awake and agreeable to participate. Pt reporting 8/10 pain in back minimally improved since arrival. The pt is alert and oriented x3 flat affect, pleasant, conversational, and following simple and multi-step commands consistently. Functional mobility assessment demonstrates moderate weakness, the pt now requiring Mod-assist physical assistance to exit the bed, whereas the patient performs this independently at baseline. Transfers and AMB demonstrate poor mild falls anxiety, good awareness of deficits and limitations, and limited stability in gait, with minimal trunk movement. The patient is at high risk for falls as evidence by gait speed <1.85ms. Pt will benefit from skilled PT intervention to increase independence and safety with basic mobility in preparation for discharge to the venue listed below.       Follow Up Recommendations Home health PT    Equipment Recommendations  Cane    Recommendations for Other Services       Precautions / Restrictions Precautions Precautions: Fall      Mobility  Bed Mobility Overal bed mobility: Needs Assistance Bed Mobility: Supine to Sit     Supine to sit: Mod assist     General bed mobility comments: requires BUE to pull self up with HHA   Transfers Overall transfer level:  Needs assistance Equipment used: None Transfers: Sit to/from Stand Sit to Stand: Min guard         General transfer comment: reaching for furniture for stability   Ambulation/Gait   Ambulation Distance (Feet): 360 Feet Assistive device: None     Gait velocity interpretation: <1.8 ft/sec, indicative of risk for recurrent falls General Gait Details: 0.721m  Stairs            Wheelchair Mobility    Modified Rankin (Stroke Patients Only)       Balance Overall balance assessment: Needs assistance         Standing balance support: Bilateral upper extremity supported;Single extremity supported Standing balance-Leahy Scale: Good               High level balance activites: Turns;Direction changes High Level Balance Comments: very unsteady with poor confidence during gait.              Pertinent Vitals/Pain Pain Assessment: 0-10 Pain Score: 8  Pain Location: low back Pain Descriptors / Indicators: Aching Pain Intervention(s): Limited activity within patient's tolerance;Monitored during session;Premedicated before session    HoLillyxpects to be discharged to:: Private residence Living Arrangements: Alone   Type of Home: House Home Access: Stairs to enter     Home Layout: Multi-level        Prior Function Level of Independence: Independent (Pt has not AMB outside of home in 6 months. )         Comments: Assistance from son and DIL; has [facility] for bathing 2d weekly (1hour daily), and [facility].      Hand Dominance  Extremity/Trunk Assessment   Upper Extremity Assessment Upper Extremity Assessment: Defer to OT evaluation    Lower Extremity Assessment Lower Extremity Assessment: Generalized weakness;Overall [facility] for tasks assessed    Cervical / Trunk Assessment Cervical / Trunk Assessment:  (weak, difficulty with bed mobility )  Communication   Communication: No difficulties  Cognition  Arousal/Alertness: Awake/alert Behavior During Therapy: WFL for tasks assessed/performed Overall Cognitive Status: Within Functional Limits for tasks assessed                                        General Comments      Exercises     Assessment/Plan    PT Assessment Patient needs continued PT services  PT Problem List Decreased strength;Decreased activity tolerance;Decreased balance;Decreased coordination;Decreased mobility;Obesity       PT Treatment Interventions Functional mobility training;Therapeutic activities;Therapeutic exercise;Balance training    PT Goals (Current goals can be found in the Care Plan section)  Acute Rehab PT Goals Patient Stated Goal: improve self sufficiency at home with self care  PT Goal Formulation: With patient Time For Goal Achievement: 09/08/16 Potential to Achieve Goals: Fair    Frequency Min 2X/week   Barriers to discharge Decreased caregiver support      Co-evaluation               AM-PAC PT "6 Clicks" Daily Activity  Outcome Measure Difficulty turning over in bed (including adjusting bedclothes, sheets and blankets)?: A Little Difficulty moving from lying on back to sitting on the side of the bed? : Total Difficulty sitting down on and standing up from a chair with arms (e.g., wheelchair, bedside commode, etc,.)?: A Lot Help needed moving to and from a bed to chair (including a wheelchair)?: A Lot Help needed walking in hospital room?: A Lot Help needed climbing 3-5 steps with a railing? : A Lot 6 Click Score: 12    End of Session Equipment Utilized During Treatment: Gait belt Activity Tolerance: Patient tolerated treatment well;Patient limited by fatigue Patient left: in bed;with call bell/phone within reach Nurse Communication: Mobility status PT Visit Diagnosis: Unsteadiness on feet (R26.81);Other abnormalities of gait and mobility (R26.89);Muscle weakness (generalized) (M62.81)    Time: 8250-5397 PT  Time Calculation (min) (ACUTE ONLY): 17 min   Charges:   PT Evaluation $PT Eval Moderate Complexity: 1 Procedure PT Treatments $Therapeutic Activity: 8-22 mins   PT G Codes:   PT G-Codes **NOT FOR INPATIENT CLASS** Functional Assessment Tool Used: AM-PAC 6 Clicks Basic Mobility Functional Limitation: Mobility: Walking and moving around Mobility: Walking and Moving Around Current Status (Q7341): At least 60 percent but less than 80 percent impaired, limited or restricted Mobility: Walking and Moving Around Goal Status [phone]): At least 40 percent but less than 60 percent impaired, limited or restricted    12:41 PM, 08/25/16 Etta Grandchild, PT, DPT Physical Therapist - [place] [phone] (Office)   Sissi Padia C 08/25/2016, 12:38 PM

## 2016-08-25 NOTE — Progress Notes (Signed)
*  PRELIMINARY RESULTS* Echocardiogram 2D Echocardiogram has been performed.  Margaret Mcdaniel 08/25/2016, 12:46 PM

## 2016-08-25 NOTE — H&P (Signed)
History and Physical    Margaret Mcdaniel ZHY:[id number] DOB: 26-May-1950 DOA: 08/24/2016  PCP: [name], MD  Patient coming from: home  Chief Complaint:   Back pain and fever  HPI: Margaret Mcdaniel is a 66 y.o. female with medical history significant of breast cancer on chemo, recent uti over a week ago completed course of cipro saw her PCP today dr hall for worsening back pain and fever was given levaquin to start.  Her [place] noted her temp to be over 101 at home and advised to come to ED.  No n/v.  Pain is flank bilaterally and she normally does not have this.  No rashes.  No cp .  Nosob.  No cough.  Pt found to have persistent uti, no imaging done and h/o kidney stones in past.  Imaging ordered is pending by myself.  Referred for admission for uti despite outpt abx.    Review of Systems: As per HPI otherwise 10 point review of systems negative.   Past Medical History:  Diagnosis Date  . Anxiety   . Arthritis   . Breast cancer metastasized to axillary lymph node, left ([place]) 04/14/2016  . Cancer ([place])    left breast  . CKD (chronic kidney disease) stage 3, GFR 30-59 ml/min 06/08/2016  . COPD (chronic obstructive pulmonary disease) ([place])   . Diabetes mellitus without complication ([place])   . GERD (gastroesophageal reflux disease)   . History of kidney stones   . Hypothyroidism   . Mental disorder   . Neuromuscular disorder ([place])   . PONV (postoperative nausea and vomiting)   . RLS (restless legs syndrome) 06/29/2016  . Secondary Parkinson disease ([place]) 06/29/2016  . Sleep apnea    Uses CPAP  . Uncontrolled type 2 diabetes mellitus with hyperglycemia, with long-term current use of insulin ([place]) 06/08/2016    Past Surgical History:  Procedure Laterality Date  . ANKLE SURGERY Right    fusion  . APPENDECTOMY    . CHOLECYSTECTOMY    . EXPLORATORY LAPAROTOMY    . HEMORRHOID SURGERY    . MASTECTOMY MODIFIED RADICAL Left 04/14/2016   Procedure: LEFT MODIFIED RADICAL MASTECTOMY;   Surgeon: Aviva Signs, MD;  Location: [facility];  Service: General;  Laterality: Left;  . PORTACATH PLACEMENT N/A 05/26/2016   Procedure: INSERTION PORT-A-CATH RIGHT SUBCLAVIAN AND  DRAINAGE OF SEROMA LEFT CHEST;  Surgeon: Aviva Signs, MD;  Location: [facility];  Service: General;  Laterality: N/A;  . ROTATOR CUFF REPAIR    . TONSILLECTOMY    . TUBAL LIGATION       reports that she quit smoking about 2 years ago. Her smoking use included Cigarettes. She has a 50.00 pack-year smoking history. She has never used smokeless tobacco. She reports that she does not drink alcohol or use drugs.  Allergies  Allergen Reactions  . Keflex [Cephalexin] Anaphylaxis    Pt "codes" on this medication.  . Sulfa Antibiotics Hives  . Latex Rash    Family History  Problem Relation Age of Onset  . Heart disease Mother   . Bipolar disorder Father   . Heart disease Father   . Prostate cancer Brother     Prior to Admission medications   Medication Sig Start Date End Date Taking? Authorizing Provider  allopurinol (ZYLOPRIM) 100 MG tablet Take 100 mg by mouth daily.   Yes [provider]  aspirin EC 81 MG tablet Take 81 mg by mouth daily.   Yes [provider]  carbidopa-levodopa (SINEMET IR) 25-100 MG tablet 1/2 tablet in the morning and midday, and one at night 06/29/16  Yes Kathrynn Ducking, MD  chlorproMAZINE (THORAZINE) 50 MG tablet Take 100 mg by mouth at bedtime.    Yes [provider]  citalopram (CELEXA) 10 MG tablet Take 10 mg by mouth daily. 08/18/16  Yes [provider]  HYDROcodone-acetaminophen (NORCO) 5-325 MG tablet Take 1 tablet by mouth every 6 (six) hours as needed for moderate pain. 04/16/16  Yes Aviva Signs, MD  insulin degludec (TRESIBA FLEXTOUCH) 100 UNIT/ML SOPN FlexTouch Pen Inject 50 Units into the skin at bedtime.    Yes [provider]  levofloxacin (LEVAQUIN) 500 MG tablet Take 500 mg by mouth daily. 7 days starting on 08/24/2016   Yes [provider]  levothyroxine (SYNTHROID, LEVOTHROID) 50 MCG tablet Take 50 mcg by mouth daily before breakfast.   Yes [provider]  lidocaine-prilocaine (EMLA) cream Apply to affected area once Patient taking differently: Apply 1 application topically as directed. Apply to affected area once 05/17/16  Yes Twana First, MD  LORazepam (ATIVAN) 0.5 MG tablet Take 1 tablet (0.5 mg total) by mouth every 6 (six) hours as needed (Nausea or vomiting). 05/17/16  Yes Twana First, MD  mirtazapine (REMERON) 15 MG tablet Take 15 mg by mouth at bedtime.   Yes [provider]  NOVOLOG MIX 70/30 FLEXPEN (70-30) 100 UNIT/ML FlexPen Inject 30 Units into the skin 2 (two) times daily with a meal.  06/08/16  Yes [provider]  omeprazole (PRILOSEC) 40 MG capsule Take 40 mg by mouth daily.   Yes [provider]  ondansetron (ZOFRAN) 8 MG tablet Take 1 tablet (8 mg total) by mouth 2 (two) times daily as needed for refractory nausea / vomiting. Start on day 3 after chemo. 05/17/16  Yes Twana First, MD  polyethylene glycol powder (GLYCOLAX/MIRALAX) powder Take 17 g by mouth daily.  04/03/16  Yes [provider]  prochlorperazine (COMPAZINE) 10 MG tablet Take 1 tablet (10 mg total) by mouth every 6 (six) hours as needed (Nausea or vomiting). 05/17/16  Yes Twana First, MD  simvastatin (ZOCOR) 40 MG tablet Take 40 mg by mouth at bedtime.   Yes [provider]  temazepam (RESTORIL) 15 MG capsule Take 15 mg by mouth at bedtime.   Yes [provider]  tiZANidine (ZANAFLEX) 4 MG tablet Take 8 mg by mouth 2 (two) times daily.   Yes [provider]  dexamethasone (DECADRON) 4 MG tablet Take 2 tablets (8 mg total) by mouth 2 (two) times daily. Start the day before Taxotere. Then again the day after chemo for 3 days. Patient not taking: Reported on 08/24/2016 05/17/16   Twana First, MD  DOCEtaxel (TAXOTERE IV) Inject into the vein. Every 3 weeks    [provider]  levofloxacin (LEVAQUIN) 750 MG tablet  06/14/16   [provider]    Physical Exam: Vitals:   08/24/16 2212 08/24/16 2225 08/24/16 2230 08/24/16 2330  BP: 124/68 124/68 135/88 140/82  Pulse: 90 81 87 89  Resp:  18 18 18   Temp:  98.8 F (37.1 C)    TempSrc:  Oral    SpO2: 96% 97% 96% 97%  Weight:      Height:        Constitutional: NAD, calm, comfortable Vitals:   08/24/16 2212 08/24/16 2225 08/24/16 2230 08/24/16 2330  BP: 124/68 124/68 135/88 140/82  Pulse: 90 81 87 89  Resp:  18 18 18   Temp:  98.8 F (37.1 C)    TempSrc:  Oral    SpO2: 96% 97% 96% 97%  Weight:      Height:       Eyes: PERRL, lids and conjunctivae normal ENMT: Mucous membranes are moist. Posterior pharynx clear of any exudate or lesions.Normal dentition.  Neck: normal, supple, no masses, no thyromegaly Respiratory: clear to auscultation bilaterally, no wheezing, no crackles. Normal respiratory effort. No accessory muscle use.  Cardiovascular: Regular rate and rhythm, 3/6 SEM murmurs no rubs / gallops. No extremity edema. 2+ pedal pulses. No carotid bruits.  Abdomen: no tenderness, no masses palpated. No hepatosplenomegaly. Bowel sounds positive.  Musculoskeletal: no clubbing / cyanosis. No joint deformity upper and lower extremities. Good ROM, no contractures. Normal muscle tone.  Skin: no rashes, lesions, ulcers. No induration Neurologic: CN 2-12 grossly intact. Sensation intact, DTR normal. Strength 5/5 in all 4.  Psychiatric: Normal judgment and insight. Alert and oriented x 3. Normal mood.    Labs on Admission: I have personally reviewed following labs and imaging studies  CBC:  Recent Labs Lab 08/24/16 2012  WBC 18.0*  NEUTROABS 13.3*  HGB 10.2*  HCT 32.2*  MCV 84.7  PLT 761   Basic Metabolic Panel:  Recent Labs Lab 08/24/16 2045  NA 137  K 3.8  CL 105  CO2 21*  GLUCOSE 153*  BUN 20  CREATININE 1.18*  CALCIUM 8.5*   GFR: Estimated Creatinine  Clearance: 48.5 mL/min (A) (by C-G formula based on SCr of 1.18 mg/dL (H)). Liver Function Tests:  Recent Labs Lab 08/24/16 2045  AST 20  ALT 16  ALKPHOS 82  BILITOT 0.4  PROT 6.5  ALBUMIN 3.1*    Coagulation Profile:  Recent Labs Lab 08/24/16 2045  INR 1.07    Urine analysis:    Component Value Date/Time   COLORURINE YELLOW 08/24/2016 2030   APPEARANCEUR HAZY (A) 08/24/2016 2030   LABSPEC 1.013 08/24/2016 2030   PHURINE 5.0 08/24/2016 2030   GLUCOSEU NEGATIVE 08/24/2016 2030   HGBUR MODERATE (A) 08/24/2016 2030   [place] NEGATIVE 08/24/2016 2030   [place] 08/24/2016 2030   PROTEINUR NEGATIVE 08/24/2016 2030   UROBILINOGEN 0.2 08/02/2012 1250   NITRITE NEGATIVE 08/24/2016 2030   LEUKOCYTESUR SMALL (A) 08/24/2016 2030    Recent Results (from the past 240 hour(s))  Culture, blood (Routine x 2)     Status: None (Preliminary result)   Collection Time: 08/24/16  8:12 PM  Result Value Ref Range Status   Specimen Description BLOOD RIGHT HAND  Final   Special Requests   Final    BOTTLES DRAWN AEROBIC AND ANAEROBIC Blood Culture results may not be optimal due to an inadequate volume of blood received in culture bottles   Culture PENDING  Incomplete   Report Status PENDING  Incomplete  Culture, blood (Routine x 2)     Status: None (Preliminary result)   Collection Time: 08/24/16  8:12 PM  Result Value Ref Range Status   Specimen Description RIGHT ANTECUBITAL  Final   Special Requests   Final    BOTTLES DRAWN AEROBIC AND ANAEROBIC Blood Culture adequate volume   Culture PENDING  Incomplete   Report Status PENDING  Incomplete     Radiological Exams on Admission: Dg Chest 2 View  Result Date: 08/24/2016 CLINICAL DATA:  Initial evaluation for acute fever, back pain. EXAM: CHEST  2 VIEW COMPARISON:  Prior radiograph from 06/21/2016. FINDINGS: Right-sided Port-A-Cath in place. Moderate cardiomegaly, stable.  Mediastinal silhouette normal. Aortic  atherosclerosis. Lungs normally inflated. No focal infiltrates. No pulmonary edema or pleural effusion. No pneumothorax. No acute osseous abnormality. Surgical clips overlie the left axilla. IMPRESSION: 1. No radiographic evidence for active cardiopulmonary disease. 2. Stable cardiomegaly without pulmonary edema. 3. Aortic atherosclerosis. Electronically Signed   By: Jeannine Boga M.D.   On: 08/24/2016 22:25   cxr reviewed no edema or infiltrate Old chart reviewed Case discussed with dr Dayna Barker   Assessment/Plan [id number] female breast cancer on chemo comes in with persistent uti despite outpt abx with possible stone/pyelo  Principal Problem:   Pyelonephritis- presumptive.  Blood and urine cx obtained.  Will order ct renal protocol to make sure there is no infected stone causing persistent symptoms.  Iv azactam will be ordered for pharmacy to dose and monitor.  Lactic acid was over 3 on arrival, has gotten over 2 liters of ivf, will repeat this now and q 3 hours until resolved.  bp was soft and is improved after ivf.  Prn fentanyl ordered for her pain.  Active Problems:   Breast cancer metastasized to axillary lymph node, left (HCC)- noted   CKD (chronic kidney disease) stage 3, GFR 30-59 ml/min- stable and at baseline   Uncontrolled type 2 diabetes mellitus with hyperglycemia, with long-term current use of insulin ([place])- cont long acting insulin and add SSI sensitive scale   Secondary Parkinson disease ([place])- noted   COPD (chronic obstructive pulmonary disease) ([place])- stable , cont home nebs    DVT prophylaxis: scds Code Status: full Family Communication:  none Disposition Plan:  Per day team Consults called:  none Admission status:  observation   Evora Schechter A MD [facility]  If 7PM-7AM, please contact night-coverage [email or web address] Password [facility]  08/25/2016, 12:31 AM

## 2016-08-25 NOTE — Progress Notes (Addendum)
PROGRESS NOTE    Margaret Mcdaniel  VQQ:[id number] DOB: Apr 20, 1950 DOA: 08/24/2016 PCP: [name], MD    Brief Narrative:  66 year old female presents with back pain and fever. Patient is known to have history of breast cancer on chemotherapy. She had a recent urinary tract infection, treated as an outpatient, with levofloxacin. On the day of admission, patient was found to be febrile up to 101, associated with positive bilateral flank pain, she was referred to the hospital for further evaluation. On initial physical examination blood pressure 124/68, heart rate 90, respiratory rate 18, temperature 98.8, oxygen saturation 96%, her mucous membranes were moist, her lungs were clear to auscultation bilaterally, heart S1-S2 present and rhythmic, abdomen was soft and nontender, no lower extremity edema. Sodium 137, potassium 3.8, chloride 105, bicarbonate 21, glucose 153, BUN 20, creatinine 1.18, INR 1.0, chest x-ray, hypoinflated, left-sided rotation, underpenetration, no infiltrates, CT, renal stone study small left renal calculi, no hydronephrosis or obstructive stone. Circumferential thickening of the perirectal and perianal subcutaneous soft tissues. Urinalysis with too numerous to count white cells. Patient was admitted to the hospital with the presumptive diagnosis of pyelonephritis.  Assessment & Plan:   Principal Problem:   Pyelonephritis Active Problems:   Breast cancer metastasized to axillary lymph node, left (H[facility])   CKD (chronic kidney disease) stage 3, GFR 30-59 ml/min   Uncontrolled type 2 diabetes mellitus with hyperglycemia, with long-term current use of insulin ([facility])   Secondary Parkinson disease ([place])   COPD (chronic obstructive pulmonary disease) ([place])   1. Pyelonephritis. Will continue antibiotic therapy with IV aztreonam. Will continue to follow on culture, cell count and temperature curve. CT with no obstructive uropathy. Old cultures personally reviewed with no significant  growth. Will continue antibiotic therapy with aztreonam, will hold on vancomycin for now. As needed fentanyl for pain control.   2. CKD stage 2. Calculated GFR 68, will continue hydration with balanced electrolyte solutions with LR at 75 cc per hour, will follow on renal panel in am. K at 3,7 and serum bicarbonate at 23.   3. T2DM. Will continue glucose cover and monitoring with sliding scale, patient tolerating po well. Continue 70/30  Insulin 20 units bid, to prevent hypoglycemia. Capillary glucose 113, 124, 178.   4. COPD, stable with no exacerbation. Will continue oxygen saturation monitoring, supplemental 02 per [place], no signs of volume overload,    5. Parkinsonism. Continue mirtazapine, citalopram,   6. Hypothyroid. Continue levothyroxine.    DVT prophylaxis: scd Code Status: full  Family Communication:  Disposition Plan: Home    Consultants:     Procedures:    Antimicrobials:   Aztreonam.    Subjective: Patient with persistent back pain, moderate in intensity, no radiation, moderate in intensity. Improved with pain medications, no dyspnea or chest pain.   Objective: Vitals:   08/24/16 2230 08/24/16 2330 08/25/16 0044 08/25/16 0610  BP: 135/88 140/82 140/67 119/72  Pulse: 87 89 97 80  Resp: 18 18 18 18   Temp:   99.8 F (37.7 C) 98.1 F (36.7 C)  TempSrc:   Oral Oral  SpO2: 96% 97% 98% 95%  Weight:   86.4 kg (190 lb 8 oz) 86.1 kg (189 lb 13.1 oz)  Height:   5\' 4"  (1.626 m)     Intake/Output Summary (Last 24 hours) at 08/25/16 0756 Last data filed at 08/25/16 0237  Gross per 24 hour  Intake             2903 ml  Output              500 ml  Net             2403 ml   Filed Weights   08/24/16 1944 08/25/16 0044 08/25/16 0610  Weight: 83.9 kg (185 lb) 86.4 kg (190 lb 8 oz) 86.1 kg (189 lb 13.1 oz)    Examination:  General exam: deconditioned E ENT: mild pallor, no icterus, oral mucosa dry.  Respiratory system: Clear to auscultation. Respiratory effort  normal. No wheezing, rales or rhonchi.  Cardiovascular system: S1 & S2 heard, RRR. No JVD, murmurs, rubs, gallops or clicks. No pedal edema. Gastrointestinal system: Abdomen distended, soft and nontender. No organomegaly or masses felt. Normal bowel sounds heard. Central nervous system: Alert and oriented. No focal neurological deficits. Extremities: Symmetric 5 x 5 power. Skin: No rashes, lesions or ulcers     Data Reviewed: I have personally reviewed following labs and imaging studies  CBC:  Recent Labs Lab 08/24/16 2012 08/25/16 0422  WBC 18.0* 17.3*  NEUTROABS 13.3*  --   HGB 10.2* 8.9*  HCT 32.2* 29.3*  MCV 84.7 86.2  PLT 178 440   Basic Metabolic Panel:  Recent Labs Lab 08/24/16 2045 08/25/16 0422  NA 137 143  K 3.8 3.7  CL 105 112*  CO2 21* 23  GLUCOSE 153* 123*  BUN 20 16  CREATININE 1.18* 1.03*  CALCIUM 8.5* 8.4*   GFR: Estimated Creatinine Clearance: 56.3 mL/min (A) (by C-G formula based on SCr of 1.03 mg/dL (H)). Liver Function Tests:  Recent Labs Lab 08/24/16 2045  AST 20  ALT 16  ALKPHOS 82  BILITOT 0.4  PROT 6.5  ALBUMIN 3.1*   No results for input(s): LIPASE, AMYLASE in the last 168 hours. No results for input(s): AMMONIA in the last 168 hours. Coagulation Profile:  Recent Labs Lab 08/24/16 2045  INR 1.07   Cardiac Enzymes: No results for input(s): CKTOTAL, CKMB, CKMBINDEX, TROPONINI in the last 168 hours. BNP (last 3 results) No results for input(s): PROBNP in the last 8760 hours. HbA1C: No results for input(s): HGBA1C in the last 72 hours. CBG:  Recent Labs Lab 08/25/16 0044 08/25/16 0742  GLUCAP 113* 124*   Lipid Profile: No results for input(s): CHOL, HDL, LDLCALC, TRIG, CHOLHDL, LDLDIRECT in the last 72 hours. Thyroid Function Tests: No results for input(s): TSH, T4TOTAL, FREET4, T3FREE, THYROIDAB in the last 72 hours. Anemia Panel: No results for input(s): VITAMINB12, FOLATE, FERRITIN, TIBC, IRON, RETICCTPCT in the  last 72 hours. Sepsis Labs:  Recent Labs Lab 08/24/16 2019 08/25/16 0124 08/25/16 0422  LATICACIDVEN 3.63* 2.2* 1.1    Recent Results (from the past 240 hour(s))  Culture, blood (Routine x 2)     Status: None (Preliminary result)   Collection Time: 08/24/16  8:12 PM  Result Value Ref Range Status   Specimen Description BLOOD RIGHT HAND  Final   Special Requests   Final    BOTTLES DRAWN AEROBIC AND ANAEROBIC Blood Culture results may not be optimal due to an inadequate volume of blood received in culture bottles   Culture PENDING  Incomplete   Report Status PENDING  Incomplete  Culture, blood (Routine x 2)     Status: None (Preliminary result)   Collection Time: 08/24/16  8:12 PM  Result Value Ref Range Status   Specimen Description RIGHT ANTECUBITAL  Final   Special Requests   Final    BOTTLES DRAWN AEROBIC AND ANAEROBIC Blood Culture adequate volume  Culture PENDING  Incomplete   Report Status PENDING  Incomplete         Radiology Studies: Dg Chest 2 View  Result Date: 08/24/2016 CLINICAL DATA:  Initial evaluation for acute fever, back pain. EXAM: CHEST  2 VIEW COMPARISON:  Prior radiograph from 06/21/2016. FINDINGS: Right-sided Port-A-Cath in place. Moderate cardiomegaly, stable. Mediastinal silhouette normal. Aortic atherosclerosis. Lungs normally inflated. No focal infiltrates. No pulmonary edema or pleural effusion. No pneumothorax. No acute osseous abnormality. Surgical clips overlie the left axilla. IMPRESSION: 1. No radiographic evidence for active cardiopulmonary disease. 2. Stable cardiomegaly without pulmonary edema. 3. Aortic atherosclerosis. Electronically Signed   By: Jeannine Boga M.D.   On: 08/24/2016 22:25   Ct Renal Stone Study  Result Date: 08/25/2016 CLINICAL DATA:  66 year old female with low back pain and dysuria. History of recent antibiotic treatment. EXAM: CT ABDOMEN AND PELVIS WITHOUT CONTRAST TECHNIQUE: Multidetector CT imaging of the  abdomen and pelvis was performed following the standard protocol without IV contrast. COMPARISON:  Abdominal CT dated 06/21/2016 FINDINGS: Evaluation of this exam is limited in the absence of intravenous contrast. Lower chest: The visualized lung bases are clear. Mild coronary vascular calcification noted. There is hypoattenuation of the cardiac blood pool suggestive of a degree of anemia. Clinical correlation is recommended. No intra-abdominal free air or free fluid. Hepatobiliary: Cholecystectomy. The liver is unremarkable. No intrahepatic biliary ductal dilatation. Pancreas: Unremarkable. No pancreatic ductal dilatation or surrounding inflammatory changes. Spleen: Normal in size without focal abnormality. Adrenals/Urinary Tract: The adrenal glands are unremarkable. Mild bilateral renal parenchyma atrophy. There bilateral renal cortical irregularities, likely related to chronic infection and infarct. Multiple small nonobstructing left renal inferior pole stones measure up to 4 mm. No hydronephrosis. There is no hydronephrosis or nephrolithiasis on the right. The visualized ureters and urinary bladder appear unremarkable. Stomach/Bowel: Surgical clips noted along the greater curvature of the stomach. There is a moderate amount of stool throughout the colon. Large dense stool noted within the rectal vault which may represent a degree of fecal impaction. There is no evidence of bowel obstruction or active inflammation. The appendix is not visualized with certainty, likely surgically absent. No inflammatory changes or secondary signs of appendicitis. There is herniation of a short segment of the ascending colon through the right lateral abdominal wall defect inferior to the liver. No evidence of inflammatory changes or obstruction. There is slight prolapse of the rectum. There is diffuse thickening of the perirectal and perianal subcutaneous soft tissues extending into the intergluteal fissure. This likely represents  a combination of acute on chronic inflammatory changes and scarring. Correlation with clinical exam recommended. No drainable fluid collection or abscess identified. Vascular/Lymphatic: There is moderate aortoiliac atherosclerotic disease. No aneurysmal dilatation. The IVC is grossly unremarkable. No portal venous gas noted. Evaluation of the vasculature is limited in the absence of intravenous contrast. There is no adenopathy. Reproductive: The uterus is grossly unremarkable. There is a 1.7 cm left ovarian cyst. This is similar to the prior CT. Pelvic ultrasound may provide better characterisation. The right ovary appears unremarkable. Other: There is a ventral hernia repair mesh with colonic fluid collection and calcified capsule similar to prior CT. A coccygeal mesh seen similar to the prior CTs and likely suggestive of pelvic floor laxity repair. Musculoskeletal: Degenerative changes of the spine. No acute fracture. IMPRESSION: 1. Circumferential thickening of the perirectal and perianal subcutaneous soft tissues, likely combination of acute and recurrent/chronic inflammation or infection. Correlation with clinical exam recommended. There is no drainable fluid  collection or abscess. 2. Small left renal calculi. No hydronephrosis or obstructing stone. 3. No evidence of bowel obstruction or active inflammation. Moderate colonic stool burden with possible fecal impaction in the rectum. 4. Stable small right lateral abdominal wall hernia containing a short segment of the ascending colon without evidence of obstruction or inflammation. 5. Ventral hernia repair mesh with stable chronic changes/fluid and calcified capsule. A coccygeal mesh is also seen similar to the prior CTs and likely related to repair of pelvic floor laxity. Electronically Signed   By: Anner Crete M.D.   On: 08/25/2016 01:00        Scheduled Meds: . allopurinol  100 mg Oral Daily  . aspirin EC  81 mg Oral Daily  . chlorproMAZINE  100  mg Oral QHS  . citalopram  10 mg Oral Daily  . feeding supplement (ENSURE ENLIVE)  237 mL Oral BID BM  . insulin aspart  0-9 Units Subcutaneous TID WC  . insulin aspart protamine - aspart  30 Units Subcutaneous BID WC  . levothyroxine  50 mcg Oral QAC breakfast  . mirtazapine  15 mg Oral QHS  . sodium chloride flush  3 mL Intravenous Q12H   Continuous Infusions: . sodium chloride    . aztreonam Stopped (08/25/16 0545)     LOS: 0 days        Mauricio Gerome Apley, MD [facility] Pager [phone]  If 7PM-7AM, please contact night-coverage [email or web address] Password [facility] 08/25/2016, 7:56 AM

## 2016-08-25 NOTE — Care Management Note (Signed)
Case Management Note  Patient Details  Name: Margaret Mcdaniel MRN: [id number] Date of Birth: [birth year]  Subjective/Objective:                  Pt admitted with probably pyelonephritis. Pt from home, lives alone has [place] and aid through [facility]. She has son and DIL who are very active with her care. Pt reports no DME needs. Pt plans to DC home with resumption of her [facility] services. CM spoke with DIL on phone and she communicates no needs.   Action/Plan: Plan for return home with resumption of HH. Sarah, [facility] rep, aware of admission. CM will cont to follow.   Expected Discharge Date:       08/26/2016           Expected Discharge Plan:  [place]  In-House Referral:  NA  Discharge planning Services  CM Consult  Post Acute Care Choice:  Resumption of Svcs/PTA Provider, Home Health Choice offered to:  Patient  HH Arranged:  RN, Nurse's Aide HH Agency:  [facility]  Status of Service:  In process, will continue to follow  Sherald Barge, RN 08/25/2016, 9:33 AM

## 2016-08-25 NOTE — ED Notes (Signed)
Report given to Craig, RN 300

## 2016-08-26 LAB — GLUCOSE, CAPILLARY
GLUCOSE-CAPILLARY: 222 mg/dL — AB (ref 65–99)
Glucose-Capillary: 141 mg/dL — ABNORMAL HIGH (ref 65–99)
Glucose-Capillary: 181 mg/dL — ABNORMAL HIGH (ref 65–99)

## 2016-08-26 LAB — CBC WITH DIFFERENTIAL/PLATELET
Basophils Absolute: 0 10*3/uL (ref 0.0–0.1)
Basophils Relative: 0 %
Eosinophils Absolute: 0 10*3/uL (ref 0.0–0.7)
Eosinophils Relative: 0 %
HEMATOCRIT: 30 % — AB (ref 36.0–46.0)
Hemoglobin: 9.4 g/dL — ABNORMAL LOW (ref 12.0–15.0)
LYMPHS ABS: 3 10*3/uL (ref 0.7–4.0)
LYMPHS PCT: 26 %
MCH: 26.6 pg (ref 26.0–34.0)
MCHC: 31.3 g/dL (ref 30.0–36.0)
MCV: 84.7 fL (ref 78.0–100.0)
MONO ABS: 0.8 10*3/uL (ref 0.1–1.0)
MONOS PCT: 7 %
NEUTROS ABS: 8 10*3/uL — AB (ref 1.7–7.7)
Neutrophils Relative %: 67 %
Platelets: 194 10*3/uL (ref 150–400)
RBC: 3.54 MIL/uL — ABNORMAL LOW (ref 3.87–5.11)
RDW: 19.4 % — AB (ref 11.5–15.5)
WBC: 11.8 10*3/uL — ABNORMAL HIGH (ref 4.0–10.5)

## 2016-08-26 LAB — BASIC METABOLIC PANEL
ANION GAP: 9 (ref 5–15)
BUN: 17 mg/dL (ref 6–20)
CALCIUM: 8.7 mg/dL — AB (ref 8.9–10.3)
CO2: 24 mmol/L (ref 22–32)
Chloride: 108 mmol/L (ref 101–111)
Creatinine, Ser: 0.95 mg/dL (ref 0.44–1.00)
GFR calc Af Amer: >60 mL/min (ref >60)
GFR calc non Af Amer: >60 mL/min (ref >60)
GLUCOSE: 144 mg/dL — AB (ref 65–99)
Potassium: 3.8 mmol/L (ref 3.5–5.1)
Sodium: 141 mmol/L (ref 135–145)

## 2016-08-26 LAB — URINE CULTURE

## 2016-08-26 MED ORDER — NITROFURANTOIN MONOHYD MACRO 100 MG PO CAPS: 100.0000 mg | ORAL_CAPSULE | Freq: Two times a day (BID) | ORAL | 0 refills | Status: AC

## 2016-08-26 NOTE — Discharge Summary (Signed)
Physician Discharge Summary  BECKY COLAN GGE:[id number] DOB: [birth year] DOA: 08/24/2016  PCP: [name], MD  Admit date: 08/24/2016 Discharge date: 08/26/2016  Admitted From: Home  Disposition:  Home    Recommendations for Outpatient Follow-up:  1. Follow up with PCP in 1- week 2. Patient placed on Nitrofurantoin for the next 7 days  Home Health: Yes  Equipment/Devices: No   Discharge Condition: Stable  CODE STATUS: Full  Diet recommendation: Heart Healthy / Carb Modified  Brief/Interim Summary: 66 year old female presents with back pain and fever. Patient is known to have history of breast cancer on chemotherapy. She had a recent urinary tract infection, treated as an outpatient, with Ciprofloxacin. On the day of admission, patient was found to be febrile up to 101, associated with positive bilateral flank pain, she was referred to the hospital for further evaluation. On initial physical examination blood pressure 124/68, heart rate 90, respiratory rate 18, temperature 98.8, oxygen saturation 96%, her mucous membranes were moist, her lungs were clear to auscultation bilaterally, heart S1-S2 present and rhythmic, abdomen was soft and nontender, no lower extremity edema. Sodium 137, potassium 3.8, chloride 105, bicarbonate 21, glucose 153, BUN 20, creatinine 1.18, INR 1.0, White count 18.0, hemoglobin 10.2, hematocrit 32.2, platelets 178, chest x-ray, hypoinflated, left-sided rotation, underpenetration, no infiltrates, CT, renal stone study small left renal calculi, no hydronephrosis or obstructive stone. Circumferential thickening of the perirectal and perianal subcutaneous soft tissues. Urinalysis with too numerous to count white cells. Patient was admitted to the hospital with the presumptive diagnosis of pyelonephritis.  1. Acute pyelonephritis. Patient was admitted to medical ward, placed on the remote telemetry monitor, she received IV antibiotics with aztreonam with improvement of  her symptoms, CT of the abdomen and pelvis with no obstructive uropathy. Her white cell count at discharge was 11.8, patient has remained afebrile. She was recently placed on ciprofloxacin for urinary tract infection, will complete antibiotic therapy with nitrofurantoin with close outpatient follow-up as an outpatient. Patient allergic to cephalosporins and sulfa agents.   2. Chronic kidney disease stage II. Patient received balanced electrolyte solutions intravenously with good toleration, kidney function remained stable, discharge creatinine 0.95, normal electrolytes.  3. Type 2 diabetes mellitus. Patient was placed on insulin sliding-scale for glucose coverage and monitoring, she was continued on 70/30 insulin at lower dose of 20 units twice a day to avoid hypoglycemia, capillary glucose 178, 162, 129, 141.   4. COPD, stable with no exacerbation. Patient continue supplemental oxygen per nasal cannula as needed.  5. Parkinsonism. She was continued on, sinemet, mirtazapine and citalopram  6. Hypothyroidism. Continue levothyroxine.   Discharge Diagnoses:  Principal Problem:   Pyelonephritis Active Problems:   Breast cancer metastasized to axillary lymph node, left (HCC)   CKD (chronic kidney disease) stage 3, GFR 30-59 ml/min   Uncontrolled type 2 diabetes mellitus with hyperglycemia, with long-term current use of insulin (HCC)   Secondary Parkinson disease ([place])   COPD (chronic obstructive pulmonary disease) ([place])   Encephalopathy    Discharge Instructions   Allergies as of 08/26/2016      Reactions   Keflex [cephalexin] Anaphylaxis   Pt "codes" on this medication.   Sulfa Antibiotics Hives   Latex Rash      Medication List    STOP taking these medications   ciprofloxacin 250 MG tablet Commonly known as:  CIPRO   dexamethasone 4 MG tablet Commonly known as:  DECADRON   levofloxacin 500 MG tablet Commonly known as:  LEVAQUIN  levofloxacin 750 MG tablet Commonly known  as:  LEVAQUIN     TAKE these medications   allopurinol 100 MG tablet Commonly known as:  ZYLOPRIM Take 100 mg by mouth daily.   aspirin EC 81 MG tablet Take 81 mg by mouth daily.   carbidopa-levodopa 25-100 MG tablet Commonly known as:  SINEMET IR 1/2 tablet in the morning and midday, and one at night   chlorproMAZINE 50 MG tablet Commonly known as:  THORAZINE Take 100 mg by mouth at bedtime.   citalopram 10 MG tablet Commonly known as:  CELEXA Take 10 mg by mouth daily.   HYDROcodone-acetaminophen 5-325 MG tablet Commonly known as:  NORCO Take 1 tablet by mouth every 6 (six) hours as needed for moderate pain.   levothyroxine 50 MCG tablet Commonly known as:  SYNTHROID, LEVOTHROID Take 50 mcg by mouth daily before breakfast.   lidocaine-prilocaine cream Commonly known as:  EMLA Apply to affected area once What changed:  how much to take  how to take this  when to take this  additional instructions   LORazepam 0.5 MG tablet Commonly known as:  ATIVAN Take 1 tablet (0.5 mg total) by mouth every 6 (six) hours as needed (Nausea or vomiting).   mirtazapine 15 MG tablet Commonly known as:  REMERON Take 15 mg by mouth at bedtime.   nitrofurantoin (macrocrystal-monohydrate) 100 MG capsule Commonly known as:  MACROBID Take 1 capsule (100 mg total) by mouth 2 (two) times daily.   NOVOLOG MIX 70/30 FLEXPEN (70-30) 100 UNIT/ML FlexPen Generic drug:  insulin aspart protamine - aspart Inject 30 Units into the skin 2 (two) times daily with a meal.   omeprazole 40 MG capsule Commonly known as:  PRILOSEC Take 40 mg by mouth daily.   ondansetron 8 MG tablet Commonly known as:  ZOFRAN Take 1 tablet (8 mg total) by mouth 2 (two) times daily as needed for refractory nausea / vomiting. Start on day 3 after chemo.   polyethylene glycol powder powder Commonly known as:  GLYCOLAX/MIRALAX Take 17 g by mouth daily.   prochlorperazine 10 MG tablet Commonly known as:   COMPAZINE Take 1 tablet (10 mg total) by mouth every 6 (six) hours as needed (Nausea or vomiting).   RESTORIL 15 MG capsule Generic drug:  temazepam Take 15 mg by mouth at bedtime.   simvastatin 40 MG tablet Commonly known as:  ZOCOR Take 40 mg by mouth at bedtime.   TAXOTERE IV Inject into the vein. Every 3 weeks   tiZANidine 4 MG tablet Commonly known as:  ZANAFLEX Take 8 mg by mouth 2 (two) times daily.   TRESIBA FLEXTOUCH 100 UNIT/ML Sopn FlexTouch Pen Generic drug:  insulin degludec Inject 50 Units into the skin at bedtime.            Durable Medical Equipment        Start     Ordered   08/26/16 [phone]  For home use only DME Cane  Once     08/26/16 0955     Follow-up Information    [facility] Follow up.   Specialty:  Home Health Services Contact information: [place] [address] [phone]          Allergies  Allergen Reactions  . Keflex [Cephalexin] Anaphylaxis    Pt "codes" on this medication.  . Sulfa Antibiotics Hives  . Latex Rash    Consultations:     Procedures/Studies: Dg Chest 2 View  Result Date:  08/24/2016 CLINICAL DATA:  Initial evaluation for acute fever, back pain. EXAM: CHEST  2 VIEW COMPARISON:  Prior radiograph from 06/21/2016. FINDINGS: Right-sided Port-A-Cath in place. Moderate cardiomegaly, stable. Mediastinal silhouette normal. Aortic atherosclerosis. Lungs normally inflated. No focal infiltrates. No pulmonary edema or pleural effusion. No pneumothorax. No acute osseous abnormality. Surgical clips overlie the left axilla. IMPRESSION: 1. No radiographic evidence for active cardiopulmonary disease. 2. Stable cardiomegaly without pulmonary edema. 3. Aortic atherosclerosis. Electronically Signed   By: Jeannine Boga M.D.   On: 08/24/2016 22:25   Ct Renal Stone Study  Result Date: 08/25/2016 CLINICAL DATA:  66 year old female with low back pain and dysuria. History of recent antibiotic  treatment. EXAM: CT ABDOMEN AND PELVIS WITHOUT CONTRAST TECHNIQUE: Multidetector CT imaging of the abdomen and pelvis was performed following the standard protocol without IV contrast. COMPARISON:  Abdominal CT dated 06/21/2016 FINDINGS: Evaluation of this exam is limited in the absence of intravenous contrast. Lower chest: The visualized lung bases are clear. Mild coronary vascular calcification noted. There is hypoattenuation of the cardiac blood pool suggestive of a degree of anemia. Clinical correlation is recommended. No intra-abdominal free air or free fluid. Hepatobiliary: Cholecystectomy. The liver is unremarkable. No intrahepatic biliary ductal dilatation. Pancreas: Unremarkable. No pancreatic ductal dilatation or surrounding inflammatory changes. Spleen: Normal in size without focal abnormality. Adrenals/Urinary Tract: The adrenal glands are unremarkable. Mild bilateral renal parenchyma atrophy. There bilateral renal cortical irregularities, likely related to chronic infection and infarct. Multiple small nonobstructing left renal inferior pole stones measure up to 4 mm. No hydronephrosis. There is no hydronephrosis or nephrolithiasis on the right. The visualized ureters and urinary bladder appear unremarkable. Stomach/Bowel: Surgical clips noted along the greater curvature of the stomach. There is a moderate amount of stool throughout the colon. Large dense stool noted within the rectal vault which may represent a degree of fecal impaction. There is no evidence of bowel obstruction or active inflammation. The appendix is not visualized with certainty, likely surgically absent. No inflammatory changes or secondary signs of appendicitis. There is herniation of a short segment of the ascending colon through the right lateral abdominal wall defect inferior to the liver. No evidence of inflammatory changes or obstruction. There is slight prolapse of the rectum. There is diffuse thickening of the perirectal and  perianal subcutaneous soft tissues extending into the intergluteal fissure. This likely represents a combination of acute on chronic inflammatory changes and scarring. Correlation with clinical exam recommended. No drainable fluid collection or abscess identified. Vascular/Lymphatic: There is moderate aortoiliac atherosclerotic disease. No aneurysmal dilatation. The IVC is grossly unremarkable. No portal venous gas noted. Evaluation of the vasculature is limited in the absence of intravenous contrast. There is no adenopathy. Reproductive: The uterus is grossly unremarkable. There is a 1.7 cm left ovarian cyst. This is similar to the prior CT. Pelvic ultrasound may provide better characterisation. The right ovary appears unremarkable. Other: There is a ventral hernia repair mesh with colonic fluid collection and calcified capsule similar to prior CT. A coccygeal mesh seen similar to the prior CTs and likely suggestive of pelvic floor laxity repair. Musculoskeletal: Degenerative changes of the spine. No acute fracture. IMPRESSION: 1. Circumferential thickening of the perirectal and perianal subcutaneous soft tissues, likely combination of acute and recurrent/chronic inflammation or infection. Correlation with clinical exam recommended. There is no drainable fluid collection or abscess. 2. Small left renal calculi. No hydronephrosis or obstructing stone. 3. No evidence of bowel obstruction or active inflammation. Moderate colonic stool burden with possible  fecal impaction in the rectum. 4. Stable small right lateral abdominal wall hernia containing a short segment of the ascending colon without evidence of obstruction or inflammation. 5. Ventral hernia repair mesh with stable chronic changes/fluid and calcified capsule. A coccygeal mesh is also seen similar to the prior CTs and likely related to repair of pelvic floor laxity. Electronically Signed   By: Anner Crete M.D.   On: 08/25/2016 01:00        Subjective: Patient feeling better, no nausea or vomiting, no abdominal pain.   Discharge Exam: Vitals:   08/26/16 0701 08/26/16 0800  BP: 120/76 109/71  Pulse: 69 89  Resp: 18 16  Temp: 98.4 F (36.9 C) 97.9 F (36.6 C)   Vitals:   08/25/16 2128 08/26/16 0500 08/26/16 0701 08/26/16 0800  BP: 120/60  120/76 109/71  Pulse: 87  69 89  Resp: 18  18 16   Temp: 98.5 F (36.9 C)  98.4 F (36.9 C) 97.9 F (36.6 C)  TempSrc: Oral  Oral Oral  SpO2: 94%  96% 97%  Weight:  84.4 kg (186 lb)    Height:        General: Pt is alert, awake, not in acute distress Oral mucosa moist, no pallor or icterus.  Cardiovascular: RRR, S1/S2 +, no rubs, no gallops Respiratory: CTA bilaterally, no wheezing, no rhonchi Abdominal: Soft, NT, ND, bowel sounds + Extremities: no edema, no cyanosis    The results of significant diagnostics from this hospitalization (including imaging, microbiology, ancillary and laboratory) are listed below for reference.     Microbiology: Recent Results (from the past 240 hour(s))  Culture, blood (Routine x 2)     Status: None (Preliminary result)   Collection Time: 08/24/16  8:12 PM  Result Value Ref Range Status   Specimen Description BLOOD RIGHT HAND  Final   Special Requests   Final    BOTTLES DRAWN AEROBIC AND ANAEROBIC Blood Culture results may not be optimal due to an inadequate volume of blood received in culture bottles   Culture NO GROWTH 2 DAYS  Final   Report Status PENDING  Incomplete  Culture, blood (Routine x 2)     Status: None (Preliminary result)   Collection Time: 08/24/16  8:12 PM  Result Value Ref Range Status   Specimen Description RIGHT ANTECUBITAL  Final   Special Requests   Final    BOTTLES DRAWN AEROBIC AND ANAEROBIC Blood Culture adequate volume   Culture NO GROWTH 2 DAYS  Final   Report Status PENDING  Incomplete  Urine Culture     Status: Abnormal   Collection Time: 08/24/16  8:36 PM  Result Value Ref Range Status    Specimen Description URINE, CLEAN CATCH  Final   Special Requests NONE  Final   Culture MULTIPLE SPECIES PRESENT, SUGGEST RECOLLECTION (A)  Final   Report Status 08/26/2016 FINAL  Final     Labs: BNP (last 3 results) No results for input(s): BNP in the last 8760 hours. Basic Metabolic Panel:  Recent Labs Lab 08/24/16 2045 08/25/16 0422 08/26/16 0434  NA 137 143 141  K 3.8 3.7 3.8  CL 105 112* 108  CO2 21* 23 24  GLUCOSE 153* 123* 144*  BUN 20 16 17   CREATININE 1.18* 1.03* 0.95  CALCIUM 8.5* 8.4* 8.7*   Liver Function Tests:  Recent Labs Lab 08/24/16 2045  AST 20  ALT 16  ALKPHOS 82  BILITOT 0.4  PROT 6.5  ALBUMIN 3.1*   No results for  input(s): LIPASE, AMYLASE in the last 168 hours. No results for input(s): AMMONIA in the last 168 hours. CBC:  Recent Labs Lab 08/24/16 2012 08/25/16 0422 08/26/16 0434  WBC 18.0* 17.3* 11.8*  NEUTROABS 13.3*  --  8.0*  HGB 10.2* 8.9* 9.4*  HCT 32.2* 29.3* 30.0*  MCV 84.7 86.2 84.7  PLT 178 176 194   Cardiac Enzymes: No results for input(s): CKTOTAL, CKMB, CKMBINDEX, TROPONINI in the last 168 hours. BNP: Invalid input(s): POCBNP CBG:  Recent Labs Lab 08/25/16 0742 08/25/16 1126 08/25/16 1626 08/25/16 2135 08/26/16 0810  GLUCAP 124* 178* 162* 129* 141*   D-Dimer No results for input(s): DDIMER in the last 72 hours. Hgb A1c No results for input(s): HGBA1C in the last 72 hours. Lipid Profile No results for input(s): CHOL, HDL, LDLCALC, TRIG, CHOLHDL, LDLDIRECT in the last 72 hours. Thyroid function studies No results for input(s): TSH, T4TOTAL, T3FREE, THYROIDAB in the last 72 hours.  Invalid input(s): FREET3 Anemia work up No results for input(s): VITAMINB12, FOLATE, FERRITIN, TIBC, IRON, RETICCTPCT in the last 72 hours. Urinalysis    Component Value Date/Time   COLORURINE YELLOW 08/24/2016 2030   APPEARANCEUR HAZY (A) 08/24/2016 2030   LABSPEC 1.013 08/24/2016 2030   [place] 5.0 08/24/2016 2030    GLUCOSEU NEGATIVE 08/24/2016 2030   HGBUR MODERATE (A) 08/24/2016 2030   [place] NEGATIVE 08/24/2016 2030   [place] 08/24/2016 2030   PROTEINUR NEGATIVE 08/24/2016 2030   UROBILINOGEN 0.2 08/02/2012 1250   NITRITE NEGATIVE 08/24/2016 2030   LEUKOCYTESUR SMALL (A) 08/24/2016 2030   Sepsis Labs Invalid input(s): PROCALCITONIN,  WBC,  LACTICIDVEN Microbiology Recent Results (from the past 240 hour(s))  Culture, blood (Routine x 2)     Status: None (Preliminary result)   Collection Time: 08/24/16  8:12 PM  Result Value Ref Range Status   Specimen Description BLOOD RIGHT HAND  Final   Special Requests   Final    BOTTLES DRAWN AEROBIC AND ANAEROBIC Blood Culture results may not be optimal due to an inadequate volume of blood received in culture bottles   Culture NO GROWTH 2 DAYS  Final   Report Status PENDING  Incomplete  Culture, blood (Routine x 2)     Status: None (Preliminary result)   Collection Time: 08/24/16  8:12 PM  Result Value Ref Range Status   Specimen Description RIGHT ANTECUBITAL  Final   Special Requests   Final    BOTTLES DRAWN AEROBIC AND ANAEROBIC Blood Culture adequate volume   Culture NO GROWTH 2 DAYS  Final   Report Status PENDING  Incomplete  Urine Culture     Status: Abnormal   Collection Time: 08/24/16  8:36 PM  Result Value Ref Range Status   Specimen Description URINE, CLEAN CATCH  Final   Special Requests NONE  Final   Culture MULTIPLE SPECIES PRESENT, SUGGEST RECOLLECTION (A)  Final   Report Status 08/26/2016 FINAL  Final     Time coordinating discharge: 45 minutes  SIGNED:   Tawni Millers, MD  [facility] 08/26/2016, 10:34 AM Pager   If 7PM-7AM, please contact night-coverage [email or web address] Password [email or web address]

## 2016-08-26 NOTE — Care Management Note (Signed)
Case Management Note  Patient Details  Name: Margaret Mcdaniel MRN: [id number] Date of Birth: Dec 02, 1950  Expected Discharge Date:       08/26/2016           Expected Discharge Plan:  [place]  In-House Referral:  NA  Discharge planning Services  CM Consult  Post Acute Care Choice:  Resumption of Svcs/PTA Provider, Home Health Choice offered to:  Patient  HH Arranged:  RN, Nurse's Aide, PT [facility] Agency:  [facility]  Status of Service:  Completed, signed off  Additional Comments: Pt discharging home today with resumption of [place] services through [facility]. Pt/family aware they have 48hrs to resume services. Sarah, [facility] rep, aware of DC today and will obtain resumption orders from chart. PT recommends pt use cane. One will be ordered. Pt has no preference of provider, referral sent to [facility], [facility] rep, cane will be delivered to pt room prior to DC.   Sherald Barge, RN 08/26/2016, 9:51 AM

## 2016-08-26 NOTE — Progress Notes (Signed)
Patient discharging home.  IV removed - WNL.  Reviewed DC instructions and medications.  Emphasized importance of completing entire dose of ABX.  Educated on proper peri care to prevent further infection.  Verbalizes understanding. No questions at this time.  Instructed to follow up with PCP in 1 week.  Patient in NAD - waiting for son to pick up.  Son called and notified of patients DC.

## 2016-08-26 NOTE — Care Management (Signed)
    Durable Medical Equipment        Start     Ordered   08/26/16 [phone]  For home use only DME Cane  Once     08/26/16 0955

## 2016-08-26 NOTE — Discharge Instructions (Signed)
Pyelonephritis, Adult Pyelonephritis is a kidney infection. The kidneys are organs that help clean your blood by moving waste out of your blood and into your pee (urine). This infection can happen quickly, or it can last for a long time. In most cases, it clears up with treatment and does not cause other problems. Follow these instructions at home: Medicines  Take over-the-counter and prescription medicines only as told by your doctor.  Take your antibiotic medicine as told by your doctor. Do not stop taking the medicine even if you start to feel better. General instructions  Drink enough fluid to keep your pee clear or pale yellow.  Avoid caffeine, tea, and carbonated drinks.  Pee (urinate) often. Avoid holding in pee for long periods of time.  Pee before and after sex.  After pooping (having a bowel movement), women should wipe from front to back. Use each tissue only once.  Keep all follow-up visits as told by your doctor. This is important. Contact a doctor if:  You do not feel better after 2 days.  Your symptoms get worse.  You have a fever. Get help right away if:  You cannot take your medicine or drink fluids as told.  You have chills and shaking.  You throw up (vomit).  You have very bad pain in your side (flank) or back.  You feel very weak or you pass out (faint). This information is not intended to replace advice given to you by your health care provider. Make sure you discuss any questions you have with your health care provider. Document Released: 04/01/2004 Document Revised: 07/31/2015 Document Reviewed: 06/17/2014 Elsevier Interactive Patient Education  2018 [employer]vier Inc.  

## 2016-08-29 LAB — CULTURE, BLOOD (ROUTINE X 2)
Culture: NO GROWTH
Culture: NO GROWTH
SPECIAL REQUESTS: ADEQUATE

## 2016-09-02 DIAGNOSIS — I10 Essential (primary) hypertension: Secondary | ICD-10-CM | POA: Diagnosis not present

## 2016-09-02 DIAGNOSIS — E039 Hypothyroidism, unspecified: Secondary | ICD-10-CM | POA: Diagnosis not present

## 2016-09-02 DIAGNOSIS — E1165 Type 2 diabetes mellitus with hyperglycemia: Secondary | ICD-10-CM | POA: Diagnosis not present

## 2016-09-02 DIAGNOSIS — Z1159 Encounter for screening for other viral diseases: Secondary | ICD-10-CM | POA: Diagnosis not present

## 2016-09-06 ENCOUNTER — Telehealth: Payer: Self-pay | Admitting: Radiation Oncology

## 2016-09-06 DIAGNOSIS — D509 Iron deficiency anemia, unspecified: Secondary | ICD-10-CM | POA: Diagnosis not present

## 2016-09-06 DIAGNOSIS — Z6833 Body mass index (BMI) 33.0-33.9, adult: Secondary | ICD-10-CM | POA: Diagnosis not present

## 2016-09-06 DIAGNOSIS — N10 Acute tubulo-interstitial nephritis: Secondary | ICD-10-CM | POA: Diagnosis not present

## 2016-09-06 DIAGNOSIS — E1165 Type 2 diabetes mellitus with hyperglycemia: Secondary | ICD-10-CM | POA: Diagnosis not present

## 2016-09-06 DIAGNOSIS — Z9012 Acquired absence of left breast and nipple: Secondary | ICD-10-CM | POA: Diagnosis not present

## 2016-09-06 DIAGNOSIS — E782 Mixed hyperlipidemia: Secondary | ICD-10-CM | POA: Diagnosis not present

## 2016-09-06 DIAGNOSIS — Z794 Long term (current) use of insulin: Secondary | ICD-10-CM | POA: Diagnosis not present

## 2016-09-06 DIAGNOSIS — C50919 Malignant neoplasm of unspecified site of unspecified female breast: Secondary | ICD-10-CM | POA: Diagnosis not present

## 2016-09-06 DIAGNOSIS — M6281 Muscle weakness (generalized): Secondary | ICD-10-CM | POA: Diagnosis not present

## 2016-09-06 DIAGNOSIS — M109 Gout, unspecified: Secondary | ICD-10-CM | POA: Diagnosis not present

## 2016-09-06 DIAGNOSIS — R2681 Unsteadiness on feet: Secondary | ICD-10-CM | POA: Diagnosis not present

## 2016-09-06 DIAGNOSIS — Z87891 Personal history of nicotine dependence: Secondary | ICD-10-CM | POA: Diagnosis not present

## 2016-09-06 DIAGNOSIS — C50612 Malignant neoplasm of axillary tail of left female breast: Secondary | ICD-10-CM | POA: Diagnosis not present

## 2016-09-06 DIAGNOSIS — C50212 Malignant neoplasm of upper-inner quadrant of left female breast: Secondary | ICD-10-CM | POA: Diagnosis not present

## 2016-09-06 DIAGNOSIS — N390 Urinary tract infection, site not specified: Secondary | ICD-10-CM | POA: Diagnosis not present

## 2016-09-06 DIAGNOSIS — E039 Hypothyroidism, unspecified: Secondary | ICD-10-CM | POA: Diagnosis not present

## 2016-09-06 DIAGNOSIS — K219 Gastro-esophageal reflux disease without esophagitis: Secondary | ICD-10-CM | POA: Diagnosis not present

## 2016-09-06 DIAGNOSIS — J449 Chronic obstructive pulmonary disease, unspecified: Secondary | ICD-10-CM | POA: Diagnosis not present

## 2016-09-06 DIAGNOSIS — R260 Ataxic gait: Secondary | ICD-10-CM | POA: Diagnosis not present

## 2016-09-06 DIAGNOSIS — B9689 Other specified bacterial agents as the cause of diseases classified elsewhere: Secondary | ICD-10-CM | POA: Diagnosis not present

## 2016-09-06 DIAGNOSIS — Z170 Estrogen receptor positive status [ER+]: Secondary | ICD-10-CM | POA: Diagnosis not present

## 2016-09-06 DIAGNOSIS — G20 Parkinson's disease: Secondary | ICD-10-CM | POA: Diagnosis not present

## 2016-09-06 NOTE — Telephone Encounter (Signed)
I called to schedule an appointment with this patient to see Dr. Lisbeth Renshaw for 09/09/2016 @ 2:30 pm. I spoke with her son Duane Trias who stated he is POA for this patient along with his wife [name]. We received a faxed referral from [facility] at [place] in [place]. Patient son stated he did not understand why they could not go to [place].. Stated it maybe due to patient's insurance. He said because they live in [place] they would not be able to take radiation treatments in [place] and travel here 5 days a week for treatment. Patient's son decline the appointment for a Consultation at [facility] in [place].

## 2016-09-27 DIAGNOSIS — E109 Type 1 diabetes mellitus without complications: Secondary | ICD-10-CM | POA: Diagnosis not present

## 2016-10-11 ENCOUNTER — Other Ambulatory Visit (HOSPITAL_COMMUNITY): Payer: Self-pay

## 2016-10-11 ENCOUNTER — Ambulatory Visit (HOSPITAL_COMMUNITY): Payer: Self-pay

## 2016-10-25 DIAGNOSIS — J449 Chronic obstructive pulmonary disease, unspecified: Secondary | ICD-10-CM | POA: Diagnosis not present

## 2016-10-25 DIAGNOSIS — E109 Type 1 diabetes mellitus without complications: Secondary | ICD-10-CM | POA: Diagnosis not present

## 2016-10-26 ENCOUNTER — Telehealth: Payer: Self-pay | Admitting: Neurology

## 2016-10-26 NOTE — Telephone Encounter (Signed)
I called and talked with the daughter-in-law, we will see the patient tomorrow, I will likely need to make another referral to a psychiatrist.

## 2016-10-26 NOTE — Telephone Encounter (Signed)
Patient's daughter-in-law [name] is calling. Patient has an appointment with Dr. Jannifer Franklin tomorrow but at her last appointment Dr. Jannifer Franklin wanted the patient to be seen by a psychiatrist. She has not seen the psychiatrist yet because of being sick from chemo. Does she still need appointment tomorrow to see Dr. Jannifer Franklin even though she has not seen psychiatrist? Please call and advise. [name] can be reached at work [phone] ext. [phone].

## 2016-10-27 ENCOUNTER — Encounter: Payer: Self-pay | Admitting: Neurology

## 2016-10-27 ENCOUNTER — Ambulatory Visit (INDEPENDENT_AMBULATORY_CARE_PROVIDER_SITE_OTHER): Payer: PPO | Admitting: Neurology

## 2016-10-27 VITALS — BP 113/71 | HR 80 | Ht 64.0 in | Wt 195.0 lb

## 2016-10-27 DIAGNOSIS — G2581 Restless legs syndrome: Secondary | ICD-10-CM

## 2016-10-27 DIAGNOSIS — F209 Schizophrenia, unspecified: Secondary | ICD-10-CM

## 2016-10-27 DIAGNOSIS — G2111 Neuroleptic induced parkinsonism: Secondary | ICD-10-CM | POA: Diagnosis not present

## 2016-10-27 MED ORDER — CARBIDOPA-LEVODOPA 25-100 MG PO TABS: 1.0000 | ORAL_TABLET | Freq: Three times a day (TID) | ORAL | 5 refills | Status: DC

## 2016-10-27 NOTE — Progress Notes (Signed)
Reason for visit: Secondary parkinsonism  Margaret Mcdaniel is an 66 y.o. female  History of present illness:  Ms. Margaret Mcdaniel is a 66 year old right-handed white female with a history of schizophrenia and secondary parkinsonism. The patient has a history of breast cancer, she has completed chemotherapy but they did not undergo radiation therapy in part secondary to medical costs. The patient has been placed on low-dose Sinemet, she remains on Thorazine taking 50 mg at night and 25 mg in the morning and at midday. The patient still has tremors affecting both arms, she is able to ambulate, she has not had any falls, she sleeps fairly well at night. She does have restless leg syndrome, but this does not keep her awake. The patient denies any problems swallowing or choking. She has not been followed by a psychiatrist for her schizophrenia. She returns to this office for an evaluation.  Past Medical History:  Diagnosis Date  . Anxiety   . Arthritis   . Breast cancer metastasized to axillary lymph node, left ([place]) 04/14/2016  . Cancer ([place])    left breast  . CKD (chronic kidney disease) stage 3, GFR 30-59 ml/min 06/08/2016  . COPD (chronic obstructive pulmonary disease) ([place])   . Diabetes mellitus without complication ([place])   . GERD (gastroesophageal reflux disease)   . History of kidney stones   . Hypothyroidism   . Mental disorder   . Neuromuscular disorder ([place])   . PONV (postoperative nausea and vomiting)   . RLS (restless legs syndrome) 06/29/2016  . Secondary Parkinson disease ([place]) 06/29/2016  . Sleep apnea    Uses CPAP  . Uncontrolled type 2 diabetes mellitus with hyperglycemia, with long-term current use of insulin ([place]) 06/08/2016    Past Surgical History:  Procedure Laterality Date  . ANKLE SURGERY Right    fusion  . APPENDECTOMY    . CHOLECYSTECTOMY    . EXPLORATORY LAPAROTOMY    . HEMORRHOID SURGERY    . MASTECTOMY MODIFIED RADICAL Left 04/14/2016   Procedure: LEFT MODIFIED  RADICAL MASTECTOMY;  Surgeon: Aviva Signs, MD;  Location: [facility];  Service: General;  Laterality: Left;  . PORTACATH PLACEMENT N/A 05/26/2016   Procedure: INSERTION PORT-A-CATH RIGHT SUBCLAVIAN AND  DRAINAGE OF SEROMA LEFT CHEST;  Surgeon: Aviva Signs, MD;  Location: [facility];  Service: General;  Laterality: N/A;  . ROTATOR CUFF REPAIR    . TONSILLECTOMY    . TUBAL LIGATION      Family History  Problem Relation Age of Onset  . Heart disease Mother   . Bipolar disorder Father   . Heart disease Father   . Prostate cancer Brother     Social history:  reports that she quit smoking about 2 years ago. Her smoking use included Cigarettes. She has a 50.00 pack-year smoking history. She has never used smokeless tobacco. She reports that she does not drink alcohol or use drugs.    Allergies  Allergen Reactions  . Keflex [Cephalexin] Anaphylaxis    Pt "codes" on this medication.  . Sulfa Antibiotics Hives  . Latex Rash    Medications:  Prior to Admission medications   Medication Sig Start Date End Date Taking? Authorizing Provider  allopurinol (ZYLOPRIM) 100 MG tablet Take 100 mg by mouth daily.   Yes [provider]  aspirin EC 81 MG tablet Take 81 mg by mouth daily.   Yes [provider]  carbidopa-levodopa (SINEMET IR) 25-100 MG tablet 1/2 tablet in the morning and midday, and one  at night 06/29/16  Yes Kathrynn Ducking, MD  chlorproMAZINE (THORAZINE) 50 MG tablet Take 100 mg by mouth at bedtime.    Yes [provider]  citalopram (CELEXA) 10 MG tablet Take 10 mg by mouth daily. 08/18/16  Yes [provider]  DOCEtaxel (TAXOTERE IV) Inject into the vein. Every 3 weeks   Yes [provider]  ferrous sulfate 325 (65 FE) MG tablet Take 325 mg by mouth daily with breakfast.   Yes [provider]  insulin degludec (TRESIBA FLEXTOUCH) 100 UNIT/ML SOPN FlexTouch Pen Inject 50 Units into the skin at bedtime.    Yes [provider]    levothyroxine (SYNTHROID, LEVOTHROID) 50 MCG tablet Take 50 mcg by mouth daily before breakfast.   Yes [provider]  mirtazapine (REMERON) 15 MG tablet Take 15 mg by mouth at bedtime.   Yes [provider]  niacin 500 MG CR capsule Take 500 mg by mouth at bedtime.   Yes [provider]  nitrofurantoin (MACRODANTIN) 100 MG capsule Take 100 mg by mouth daily.   Yes [provider]  NOVOLOG MIX 70/30 FLEXPEN (70-30) 100 UNIT/ML FlexPen Inject 30 Units into the skin 2 (two) times daily with a meal.  06/08/16  Yes [provider]  omeprazole (PRILOSEC) 40 MG capsule Take 40 mg by mouth daily.   Yes [provider]  polyethylene glycol powder (GLYCOLAX/MIRALAX) powder Take 17 g by mouth daily.  04/03/16  Yes [provider]  simvastatin (ZOCOR) 40 MG tablet Take 40 mg by mouth at bedtime.   Yes [provider]  temazepam (RESTORIL) 15 MG capsule Take 15 mg by mouth at bedtime.   Yes [provider]  tiZANidine (ZANAFLEX) 4 MG tablet Take 8 mg by mouth 2 (two) times daily.   Yes [provider]    ROS:  Out of a complete 14 system review of symptoms, the patient complains only of the following symptoms, and all other reviewed systems are negative.  Heart murmur Back pain Tremor  Blood pressure 113/71, pulse 80, height 5\' 4"  (1.626 m), weight 195 lb (88.5 kg).  Physical Exam  General: The patient is alert and cooperative at the time of the examination. The patient is moderately to markedly obese.  Skin: No significant peripheral edema is noted.   Neurologic Exam  Mental status: The patient is alert and oriented x 3 at the time of the examination. The patient has apparent normal recent and remote memory, with an apparently normal attention span and concentration ability.   Cranial nerves: Facial symmetry is present. Speech is normal, no aphasia or dysarthria is noted. Extraocular movements are full.  Visual fields are full. Masking of the face is seen.  Motor: The patient has good strength in all 4 extremities.  Sensory examination: Soft touch sensation is symmetric on the face, arms, and legs.  Coordination: The patient has good finger-nose-finger and heel-to-shin bilaterally. The patient has resting tremors on both upper extremities.  Gait and station: The patient has the ability to arise from a seated position with arms crossed with some difficulty. Once up, she can walk independently, she has symmetric arm swing but this is slightly decreased, tremors are seen on both arms. The patient is able to perform tandem gait, this is slightly unsteady. Romberg is negative, but is unsteady.  Reflexes: Deep tendon reflexes are symmetric.   Assessment/Plan:  1. Secondary parkinsonism  2. Schizophrenia  3. Restless leg syndrome  The patient will  be increased on the Sinemet taking the 25/100 mg tablet 3 times daily, the family is to watch out for increased confusion. In the past, she has been on Cogentin which resulted in significant confusion. The patient will be referred to psychiatry. I have suggested a medication such as Nuplazid, but the medication is expensive, the family indicates that cost is a significant factor. The patient will follow-up in 4 or 5 months.  Jill Alexanders MD 10/27/2016 9:02 AM  [facility] [address] [place], [place] [address]  Phone [phone] Fax [phone]

## 2016-10-27 NOTE — Patient Instructions (Signed)
   We will go up on the Sinemet to 1 tablet three times a day.  Sinemet (carbidopa) may result in confusion or hallucinations, drowsiness, nausea, or dizziness. If any significant side effects are noted, please contact our office. Sinemet may not be well absorbed when taken with high protein meals, if tolerated it is best to take 30-45 minutes before you eat.

## 2016-11-02 ENCOUNTER — Encounter: Payer: Self-pay | Admitting: Neurology

## 2016-11-17 ENCOUNTER — Telehealth: Payer: Self-pay | Admitting: Neurology

## 2016-11-17 NOTE — Telephone Encounter (Signed)
Due to patient's Insurance I had to send referral to Norma Fredrickson MD telephone [phone] - fax [phone] . I called and spoke to daughter and she was ok with this.

## 2016-11-22 NOTE — Telephone Encounter (Signed)
Patient was discharged from Dr. Karen Chafe office in 2017 I will have to try another office.

## 2016-11-22 NOTE — Telephone Encounter (Signed)
Called [place] at [employer] they expect her insurance [phone]. They expect her insurance her insurance I have called her daughter and left her a message with details.

## 2016-12-13 DIAGNOSIS — E109 Type 1 diabetes mellitus without complications: Secondary | ICD-10-CM | POA: Diagnosis not present

## 2016-12-20 DIAGNOSIS — I10 Essential (primary) hypertension: Secondary | ICD-10-CM | POA: Diagnosis not present

## 2016-12-20 DIAGNOSIS — E039 Hypothyroidism, unspecified: Secondary | ICD-10-CM | POA: Diagnosis not present

## 2016-12-20 DIAGNOSIS — E1165 Type 2 diabetes mellitus with hyperglycemia: Secondary | ICD-10-CM | POA: Diagnosis not present

## 2016-12-24 DIAGNOSIS — G20 Parkinson's disease: Secondary | ICD-10-CM | POA: Diagnosis not present

## 2016-12-24 DIAGNOSIS — Z853 Personal history of malignant neoplasm of breast: Secondary | ICD-10-CM | POA: Diagnosis not present

## 2016-12-24 DIAGNOSIS — E782 Mixed hyperlipidemia: Secondary | ICD-10-CM | POA: Diagnosis not present

## 2016-12-24 DIAGNOSIS — G4733 Obstructive sleep apnea (adult) (pediatric): Secondary | ICD-10-CM | POA: Diagnosis not present

## 2016-12-24 DIAGNOSIS — K219 Gastro-esophageal reflux disease without esophagitis: Secondary | ICD-10-CM | POA: Diagnosis not present

## 2016-12-24 DIAGNOSIS — E1165 Type 2 diabetes mellitus with hyperglycemia: Secondary | ICD-10-CM | POA: Diagnosis not present

## 2016-12-24 DIAGNOSIS — R945 Abnormal results of liver function studies: Secondary | ICD-10-CM | POA: Diagnosis not present

## 2016-12-24 DIAGNOSIS — E039 Hypothyroidism, unspecified: Secondary | ICD-10-CM | POA: Diagnosis not present

## 2016-12-24 DIAGNOSIS — D509 Iron deficiency anemia, unspecified: Secondary | ICD-10-CM | POA: Diagnosis not present

## 2016-12-24 DIAGNOSIS — Z87440 Personal history of urinary (tract) infections: Secondary | ICD-10-CM | POA: Diagnosis not present

## 2016-12-24 DIAGNOSIS — M109 Gout, unspecified: Secondary | ICD-10-CM | POA: Diagnosis not present

## 2016-12-24 DIAGNOSIS — Z23 Encounter for immunization: Secondary | ICD-10-CM | POA: Diagnosis not present

## 2017-01-03 DIAGNOSIS — E109 Type 1 diabetes mellitus without complications: Secondary | ICD-10-CM | POA: Diagnosis not present

## 2017-01-03 DIAGNOSIS — J449 Chronic obstructive pulmonary disease, unspecified: Secondary | ICD-10-CM | POA: Diagnosis not present

## 2017-01-13 DIAGNOSIS — E109 Type 1 diabetes mellitus without complications: Secondary | ICD-10-CM | POA: Diagnosis not present

## 2017-02-08 DIAGNOSIS — G4733 Obstructive sleep apnea (adult) (pediatric): Secondary | ICD-10-CM | POA: Diagnosis not present

## 2017-02-11 DIAGNOSIS — E109 Type 1 diabetes mellitus without complications: Secondary | ICD-10-CM | POA: Diagnosis not present

## 2017-02-18 DIAGNOSIS — G4733 Obstructive sleep apnea (adult) (pediatric): Secondary | ICD-10-CM | POA: Diagnosis not present

## 2017-04-01 DIAGNOSIS — E1165 Type 2 diabetes mellitus with hyperglycemia: Secondary | ICD-10-CM | POA: Diagnosis not present

## 2017-04-01 DIAGNOSIS — E782 Mixed hyperlipidemia: Secondary | ICD-10-CM | POA: Diagnosis not present

## 2017-04-01 DIAGNOSIS — I10 Essential (primary) hypertension: Secondary | ICD-10-CM | POA: Diagnosis not present

## 2017-04-01 DIAGNOSIS — D509 Iron deficiency anemia, unspecified: Secondary | ICD-10-CM | POA: Diagnosis not present

## 2017-04-01 DIAGNOSIS — E039 Hypothyroidism, unspecified: Secondary | ICD-10-CM | POA: Diagnosis not present

## 2017-04-04 DIAGNOSIS — E109 Type 1 diabetes mellitus without complications: Secondary | ICD-10-CM | POA: Diagnosis not present

## 2017-04-05 DIAGNOSIS — E039 Hypothyroidism, unspecified: Secondary | ICD-10-CM | POA: Diagnosis not present

## 2017-04-05 DIAGNOSIS — D509 Iron deficiency anemia, unspecified: Secondary | ICD-10-CM | POA: Diagnosis not present

## 2017-04-05 DIAGNOSIS — E1165 Type 2 diabetes mellitus with hyperglycemia: Secondary | ICD-10-CM | POA: Diagnosis not present

## 2017-04-05 DIAGNOSIS — I10 Essential (primary) hypertension: Secondary | ICD-10-CM | POA: Diagnosis not present

## 2017-04-05 DIAGNOSIS — G250 Essential tremor: Secondary | ICD-10-CM | POA: Diagnosis not present

## 2017-04-05 DIAGNOSIS — M109 Gout, unspecified: Secondary | ICD-10-CM | POA: Diagnosis not present

## 2017-04-05 DIAGNOSIS — C50919 Malignant neoplasm of unspecified site of unspecified female breast: Secondary | ICD-10-CM | POA: Diagnosis not present

## 2017-04-05 DIAGNOSIS — K219 Gastro-esophageal reflux disease without esophagitis: Secondary | ICD-10-CM | POA: Diagnosis not present

## 2017-04-05 DIAGNOSIS — E782 Mixed hyperlipidemia: Secondary | ICD-10-CM | POA: Diagnosis not present

## 2017-04-05 DIAGNOSIS — G20 Parkinson's disease: Secondary | ICD-10-CM | POA: Diagnosis not present

## 2017-05-05 ENCOUNTER — Ambulatory Visit: Payer: PPO | Admitting: Neurology

## 2017-06-23 DIAGNOSIS — D509 Iron deficiency anemia, unspecified: Secondary | ICD-10-CM | POA: Diagnosis not present

## 2017-06-23 DIAGNOSIS — K219 Gastro-esophageal reflux disease without esophagitis: Secondary | ICD-10-CM | POA: Diagnosis not present

## 2017-06-23 DIAGNOSIS — R251 Tremor, unspecified: Secondary | ICD-10-CM | POA: Diagnosis not present

## 2017-06-23 DIAGNOSIS — E039 Hypothyroidism, unspecified: Secondary | ICD-10-CM | POA: Diagnosis not present

## 2017-06-23 DIAGNOSIS — G250 Essential tremor: Secondary | ICD-10-CM | POA: Diagnosis not present

## 2017-06-23 DIAGNOSIS — N10 Acute tubulo-interstitial nephritis: Secondary | ICD-10-CM | POA: Diagnosis not present

## 2017-06-23 DIAGNOSIS — E782 Mixed hyperlipidemia: Secondary | ICD-10-CM | POA: Diagnosis not present

## 2017-06-23 DIAGNOSIS — M109 Gout, unspecified: Secondary | ICD-10-CM | POA: Diagnosis not present

## 2017-06-23 DIAGNOSIS — I10 Essential (primary) hypertension: Secondary | ICD-10-CM | POA: Diagnosis not present

## 2017-06-23 DIAGNOSIS — C50919 Malignant neoplasm of unspecified site of unspecified female breast: Secondary | ICD-10-CM | POA: Diagnosis not present

## 2017-06-23 DIAGNOSIS — G20 Parkinson's disease: Secondary | ICD-10-CM | POA: Diagnosis not present

## 2017-06-23 DIAGNOSIS — E1165 Type 2 diabetes mellitus with hyperglycemia: Secondary | ICD-10-CM | POA: Diagnosis not present

## 2017-06-28 DIAGNOSIS — E039 Hypothyroidism, unspecified: Secondary | ICD-10-CM | POA: Diagnosis not present

## 2017-06-28 DIAGNOSIS — M109 Gout, unspecified: Secondary | ICD-10-CM | POA: Diagnosis not present

## 2017-06-28 DIAGNOSIS — G20 Parkinson's disease: Secondary | ICD-10-CM | POA: Diagnosis not present

## 2017-06-28 DIAGNOSIS — E1165 Type 2 diabetes mellitus with hyperglycemia: Secondary | ICD-10-CM | POA: Diagnosis not present

## 2017-06-28 DIAGNOSIS — C50919 Malignant neoplasm of unspecified site of unspecified female breast: Secondary | ICD-10-CM | POA: Diagnosis not present

## 2017-06-28 DIAGNOSIS — Z0001 Encounter for general adult medical examination with abnormal findings: Secondary | ICD-10-CM | POA: Diagnosis not present

## 2017-06-28 DIAGNOSIS — N10 Acute tubulo-interstitial nephritis: Secondary | ICD-10-CM | POA: Diagnosis not present

## 2017-06-28 DIAGNOSIS — E782 Mixed hyperlipidemia: Secondary | ICD-10-CM | POA: Diagnosis not present

## 2017-06-28 DIAGNOSIS — D509 Iron deficiency anemia, unspecified: Secondary | ICD-10-CM | POA: Diagnosis not present

## 2017-06-28 DIAGNOSIS — K219 Gastro-esophageal reflux disease without esophagitis: Secondary | ICD-10-CM | POA: Diagnosis not present

## 2017-09-05 DIAGNOSIS — G4733 Obstructive sleep apnea (adult) (pediatric): Secondary | ICD-10-CM | POA: Diagnosis not present

## 2017-09-27 DIAGNOSIS — E1165 Type 2 diabetes mellitus with hyperglycemia: Secondary | ICD-10-CM | POA: Diagnosis not present

## 2017-09-27 DIAGNOSIS — E782 Mixed hyperlipidemia: Secondary | ICD-10-CM | POA: Diagnosis not present

## 2017-09-27 DIAGNOSIS — D509 Iron deficiency anemia, unspecified: Secondary | ICD-10-CM | POA: Diagnosis not present

## 2017-09-27 DIAGNOSIS — I10 Essential (primary) hypertension: Secondary | ICD-10-CM | POA: Diagnosis not present

## 2017-09-27 DIAGNOSIS — E039 Hypothyroidism, unspecified: Secondary | ICD-10-CM | POA: Diagnosis not present

## 2017-09-30 DIAGNOSIS — E1122 Type 2 diabetes mellitus with diabetic chronic kidney disease: Secondary | ICD-10-CM | POA: Diagnosis not present

## 2017-09-30 DIAGNOSIS — Z853 Personal history of malignant neoplasm of breast: Secondary | ICD-10-CM | POA: Diagnosis not present

## 2017-09-30 DIAGNOSIS — G20 Parkinson's disease: Secondary | ICD-10-CM | POA: Diagnosis not present

## 2017-09-30 DIAGNOSIS — G4733 Obstructive sleep apnea (adult) (pediatric): Secondary | ICD-10-CM | POA: Diagnosis not present

## 2017-09-30 DIAGNOSIS — D508 Other iron deficiency anemias: Secondary | ICD-10-CM | POA: Diagnosis not present

## 2017-09-30 DIAGNOSIS — E039 Hypothyroidism, unspecified: Secondary | ICD-10-CM | POA: Diagnosis not present

## 2017-09-30 DIAGNOSIS — K219 Gastro-esophageal reflux disease without esophagitis: Secondary | ICD-10-CM | POA: Diagnosis not present

## 2017-09-30 DIAGNOSIS — N183 Chronic kidney disease, stage 3 (moderate): Secondary | ICD-10-CM | POA: Diagnosis not present

## 2017-09-30 DIAGNOSIS — R945 Abnormal results of liver function studies: Secondary | ICD-10-CM | POA: Diagnosis not present

## 2017-09-30 DIAGNOSIS — E782 Mixed hyperlipidemia: Secondary | ICD-10-CM | POA: Diagnosis not present

## 2017-09-30 DIAGNOSIS — F331 Major depressive disorder, recurrent, moderate: Secondary | ICD-10-CM | POA: Diagnosis not present

## 2017-09-30 DIAGNOSIS — Z87440 Personal history of urinary (tract) infections: Secondary | ICD-10-CM | POA: Diagnosis not present

## 2017-10-06 ENCOUNTER — Ambulatory Visit (HOSPITAL_COMMUNITY): Payer: Self-pay | Admitting: Internal Medicine

## 2017-10-21 ENCOUNTER — Ambulatory Visit (HOSPITAL_COMMUNITY): Payer: Self-pay | Admitting: Internal Medicine

## 2017-10-28 ENCOUNTER — Encounter (HOSPITAL_COMMUNITY): Payer: Self-pay | Admitting: Internal Medicine

## 2017-10-28 ENCOUNTER — Inpatient Hospital Stay (HOSPITAL_COMMUNITY): Payer: PPO | Attending: Internal Medicine | Admitting: Internal Medicine

## 2017-10-28 VITALS — BP 135/68 | HR 108 | Temp 99.8°F | Resp 18 | Wt 198.0 lb

## 2017-10-28 DIAGNOSIS — Z780 Asymptomatic menopausal state: Secondary | ICD-10-CM | POA: Diagnosis not present

## 2017-10-28 DIAGNOSIS — Z170 Estrogen receptor positive status [ER+]: Secondary | ICD-10-CM

## 2017-10-28 DIAGNOSIS — C50212 Malignant neoplasm of upper-inner quadrant of left female breast: Secondary | ICD-10-CM | POA: Insufficient documentation

## 2017-10-28 DIAGNOSIS — D72829 Elevated white blood cell count, unspecified: Secondary | ICD-10-CM | POA: Diagnosis not present

## 2017-10-28 DIAGNOSIS — Z87891 Personal history of nicotine dependence: Secondary | ICD-10-CM | POA: Diagnosis not present

## 2017-10-28 DIAGNOSIS — E039 Hypothyroidism, unspecified: Secondary | ICD-10-CM | POA: Insufficient documentation

## 2017-10-28 DIAGNOSIS — Z9012 Acquired absence of left breast and nipple: Secondary | ICD-10-CM | POA: Diagnosis not present

## 2017-10-28 DIAGNOSIS — Z9221 Personal history of antineoplastic chemotherapy: Secondary | ICD-10-CM | POA: Diagnosis not present

## 2017-10-28 DIAGNOSIS — C50912 Malignant neoplasm of unspecified site of left female breast: Secondary | ICD-10-CM

## 2017-10-28 DIAGNOSIS — C773 Secondary and unspecified malignant neoplasm of axilla and upper limb lymph nodes: Secondary | ICD-10-CM | POA: Insufficient documentation

## 2017-10-28 DIAGNOSIS — F99 Mental disorder, not otherwise specified: Secondary | ICD-10-CM | POA: Diagnosis not present

## 2017-10-28 DIAGNOSIS — Z9119 Patient's noncompliance with other medical treatment and regimen: Secondary | ICD-10-CM | POA: Diagnosis not present

## 2017-10-28 LAB — CBC WITH DIFFERENTIAL/PLATELET
BASOS ABS: 0 10*3/uL (ref 0.0–0.1)
Basophils Relative: 0 %
EOS PCT: 1 %
Eosinophils Absolute: 0.2 10*3/uL (ref 0.0–0.7)
HCT: 40.8 % (ref 36.0–46.0)
HEMOGLOBIN: 13.4 g/dL (ref 12.0–15.0)
Lymphocytes Relative: 29 %
Lymphs Abs: 4.8 10*3/uL — ABNORMAL HIGH (ref 0.7–4.0)
MCH: 29.1 pg (ref 26.0–34.0)
MCHC: 32.8 g/dL (ref 30.0–36.0)
MCV: 88.7 fL (ref 78.0–100.0)
Monocytes Absolute: 1.6 10*3/uL — ABNORMAL HIGH (ref 0.1–1.0)
Monocytes Relative: 10 %
NEUTROS PCT: 60 %
Neutro Abs: 10 10*3/uL — ABNORMAL HIGH (ref 1.7–7.7)
PLATELETS: 255 10*3/uL (ref 150–400)
RBC: 4.6 MIL/uL (ref 3.87–5.11)
RDW: 15.1 % (ref 11.5–15.5)
WBC: 16.6 10*3/uL — AB (ref 4.0–10.5)

## 2017-10-28 LAB — COMPREHENSIVE METABOLIC PANEL
ALT: 11 U/L (ref 0–44)
ANION GAP: 13 (ref 5–15)
AST: 27 U/L (ref 15–41)
Albumin: 4.2 g/dL (ref 3.5–5.0)
Alkaline Phosphatase: 73 U/L (ref 38–126)
BILIRUBIN TOTAL: 0.6 mg/dL (ref 0.3–1.2)
BUN: 18 mg/dL (ref 8–23)
CHLORIDE: 102 mmol/L (ref 98–111)
CO2: 24 mmol/L (ref 22–32)
Calcium: 9.3 mg/dL (ref 8.9–10.3)
Creatinine, Ser: 1.18 mg/dL — ABNORMAL HIGH (ref 0.44–1.00)
GFR, EST AFRICAN AMERICAN: 54 mL/min — AB (ref >60)
GFR, EST NON AFRICAN AMERICAN: 46 mL/min — AB (ref >60)
Glucose, Bld: 86 mg/dL (ref 70–99)
Potassium: 4.7 mmol/L (ref 3.5–5.1)
Sodium: 139 mmol/L (ref 135–145)
TOTAL PROTEIN: 7.1 g/dL (ref 6.5–8.1)

## 2017-10-28 LAB — LACTATE DEHYDROGENASE: LDH: 159 U/L (ref 98–192)

## 2017-10-28 MED ORDER — SODIUM CHLORIDE 0.9% FLUSH
10.0000 mL | INTRAVENOUS | Status: DC | PRN
Start: 2017-10-28 — End: 2017-10-28
  Administered 2017-10-28: 10 mL via INTRAVENOUS
  Filled 2017-10-28: qty 10

## 2017-10-28 MED ORDER — HEPARIN SOD (PORK) LOCK FLUSH 100 UNIT/ML IV SOLN
500.0000 [IU] | Freq: Once | INTRAVENOUS | Status: AC
  Administered 2017-10-28: 500 [IU] via INTRAVENOUS
  Filled 2017-10-28: qty 5

## 2017-10-28 NOTE — Progress Notes (Signed)
Diagnosis Breast cancer metastasized to axillary lymph node, left ([place]) - Plan: NM PET Image Initial (PI) Skull Base To Thigh, CBC with Differential/Platelet, Comprehensive metabolic panel, Lactate dehydrogenase, Cancer antigen 27.29, Cancer antigen 15-3, DG Bone Density, Cancer antigen 15-3, Cancer antigen 27.29, Lactate dehydrogenase, Comprehensive metabolic panel, CBC with Differential/Platelet, MM DIAG BREAST TOMO UNI RIGHT  Postmenopausal - Plan: DG Bone Density  Staging Cancer Staging Breast cancer metastasized to axillary lymph node, left (HCC) Staging form: Breast, AJCC 8th Edition - Pathologic stage from 04/16/2016: Stage IB (pT2, pN1a, cM0, G2, ER: Positive, PR: Positive, HER2: Negative) - Signed by Baird Cancer, PA-C on 06/28/2016 - Clinical: Stage IIA (cT2, cN1, cM0, G2, ER: Positive, PR: Positive, HER2: Negative) - Signed by Twana First, MD on 05/07/2016   Assessment and Plan: 1.  Stage IB (T2N1AM0) left breast cancer, ER/PR 100%, HER2 NEGATIVE, measuring 2.2 cm in greatest dimension and anterior margin broadly positive for invasive ductal carcinoma (upper inner quadrant) and 1/6 positive lymph nodes.  She was last seen by Dr. Talbert Cage 08/2016.  She completed 4 cycles of TC and was last treated 08/09/2016.  She was referred to [facility] for RT evaluation.  Per son he reports pt never had RT as [facility] RT was a "joke."  He reports pt does not want RT now.  She has been noncompliant with follow-up as Dr. Talbert Cage recommended adjuvant endocrine therapy once RT was completed.  Pt has not been seen at [facility] since 08/2016.    Long talk held with pt and son today.  I discussed with them we will obtain records from [facility] regarding RT recommendations.  Due to the length of time she has been off therapy, she will be set up for PET scan for restaging evaluation as she is  reporting some chest wall concerns.  She will also be set up for Dexa scan for evaluation prior to adjuvant endocrine therapy.  She has also not  had any breast imaging since 03/2016 which is when she was originally diagnosed.  She has undergone left mastectomy and she will be set up for diagnostic right breast mammogram for follow-up.    She will RTC to go over scan, Dexa and mammogram results.  Pending previous [facility] RT recommendations, may discuss with [facility] RT but pt reports she does not desire RT.  She has HR + breast cancer so she will be recommended for adjuvant therapy with AI or Tamoxifen pending imaging results.   2.  Leucocytosis.  Labs done 10/28/2017 reviewed and showed WBC 16.6 HB 13.4 plts 255,000.  Chemistries WNL with K= 4.7, Cr 1.18 normal LFTs.  Review of chart shows she had elevated WBC in 2018.  Likely due to some of her psychiatric medications.  Pt is afebrile in clinic with T = 99.8.  3.  Hypothyroidism.  Follow-up with PCP for monitoring.    4.  Mental disorder.  Follow-up with PCP or psychiatry as recommended.    5.  PAC  Pt has not had port flushed since 2018.  Will have port flushed today.  6  Noncompliance with follow-up and treatment recommendations.  She was previously followed by Dr. Talbert Cage and recommended for RT and adjuvant endocrine therapy.  She did not have RT and did not follow-up for adjuvant endocrine therapy.  She has not been seen at [facility] since 08/2016. Will involve Education officer, museum with pt due to compliance concerns.    Greater than 45 minutes spent with more than 50% spent in counseling and  coordination of care.    Interval History:  Historical data obtained from the note dated 08/09/2016.  67 yr old female previously followed by Dr. Talbert Cage for Stage IB (T2N1AM0) left breast cancer, ER/PR 100%, HER2 NEGATIVE, measuring 2.2 cm in greatest dimension and anterior margin broadly positive for invasive ductal carcinoma (upper inner quadrant) and 1/6 positive lymph nodes.  She was last seen by Dr. Talbert Cage 08/2016.  She completed 4 cycles of TC and was last treated 08/09/2016.  She was referred to [facility] for RT evaluation.   Per son he reports pt never had RT as [facility] RT was a "joke."  He reports pt does not want RT now.  She has been noncompliant with follow-up as Dr. Talbert Cage recommended adjuvant endocrine therapy once RT was completed.  Pt has not been seen at [facility] since 08/2016.    Current Status:  Pt is seen today for follow-up.  She reports she does not want RT.  She wants to discuss pill for breast cancer.    PATHOLOGY:         Breast cancer metastasized to axillary lymph node, left ([place])   04/14/2016 Initial Diagnosis    Breast cancer metastasized to axillary lymph node, left ([place])    04/14/2016 Surgery    Left modified radical mastectomy - INVASIVE GRADE 2 DUCTAL CARCINOMA, SPANNING 2.2 CM IN GREATEST DIMENSION. - ASSOCIATED INTERMEDIATE GRADE DUCTAL CARCINOMA IN SITU. - ANTERIOR MARGIN IS BROADLY POSITIVE FOR INVASIVE DUCTAL CARCINOMA IN AREA OF TUMOR (UPPER INNER QUADRANT). - OTHER MARGINS ARE NEGATIVE. - ONE OF SIX LYMPH NODES POSITIVE FOR METASTATIC DUCTAL CARCINOMA (1/6). - ER/PR positive, HER2 negative. - pT2 pN1a cM0    05/14/2016 Imaging    CT chest/abd/pelvis: IMPRESSION: 1. Left mastectomy changes with a seroma. Rounded regions of low attenuation in the axilla adjacent to surgical clips are favored to be postsurgical changes rather than mildly prominent lymph nodes. 2. The left adrenal gland is mildly thickened, likely due to hyperplasia. A tiny 6 mm nodule is likely a small adenoma. 3. Atherosclerotic changes in a non aneurysmal aorta. 4. Anterior hernia mesh is calcified. Fluid just deep to the hernia mesh is likely not acute and of doubtful acute significance.    05/26/2016 Procedure    Port-A-Cath insertion, needle aspiration of wound seroma by Dr. Arnoldo Morale    05/31/2016 -  Chemotherapy    The patient had palonosetron (ALOXI) injection 0.25 mg, 0.25 mg, Intravenous,  Once, 1 of 4 cycles  pegfilgrastim (NEULASTA) injection 6 mg, 6 mg, Subcutaneous,  Once, 1 of 4  cycles  cyclophosphamide (CYTOXAN) 1,200 mg in sodium chloride 0.9 % 250 mL chemo infusion, 600 mg/m2 = 1,200 mg, Intravenous,  Once, 1 of 4 cycles  DOCEtaxel (TAXOTERE) 150 mg in dextrose 5 % 250 mL chemo infusion, 75 mg/m2 = 150 mg, Intravenous,  Once, 1 of 4 cycles  for chemotherapy treatment.      06/07/2016 - 06/14/2016 Hospital Admission    Admit date: 06/07/2016 Admission diagnosis: Pyelonephritis Additional comments: Treated with antibiotics    06/21/2016 Treatment Plan Change    Tx deferred x 1 week due to hospitalization    06/22/2016 - 06/23/2016 Hospital Admission    Admit date: 06/22/2016 Admission diagnosis: Fever Additional comments: Source not identified      Problem List Patient Active Problem List   Diagnosis Date Noted  . Encephalopathy [G93.40] 08/25/2016  . COPD (chronic obstructive pulmonary disease) ([place]) [J44.9]   . Secondary Parkinson disease ([place]) [G21.9] 06/29/2016  .  RLS (restless legs syndrome) [G25.81] 06/29/2016  . Fever [R50.9] 06/21/2016  . Pressure injury of skin [L89.90] 06/13/2016  . Pyelonephritis [N12] 06/08/2016  . Hypothyroidism [E03.9] 06/08/2016  . Sepsis ([place]) [A41.9] 06/08/2016  . CKD (chronic kidney disease) stage 3, GFR 30-59 ml/min (HCC) [N18.3] 06/08/2016  . Uncontrolled type 2 diabetes mellitus with hyperglycemia, with long-term current use of insulin ([place]) [E11.65, Z79.4] 06/08/2016  . Breast cancer metastasized to axillary lymph node, left ([place]) [C50.912, C77.3] 04/14/2016    Past Medical History Past Medical History:  Diagnosis Date  . Anxiety   . Arthritis   . Breast cancer metastasized to axillary lymph node, left ([place]) 04/14/2016  . Cancer ([place])    left breast  . CKD (chronic kidney disease) stage 3, GFR 30-59 ml/min (HCC) 06/08/2016  . COPD (chronic obstructive pulmonary disease) ([place])   . Diabetes mellitus without complication ([place])   . GERD (gastroesophageal reflux disease)   . History of kidney stones   .  Hypothyroidism   . Mental disorder   . Neuromuscular disorder ([place])   . PONV (postoperative nausea and vomiting)   . RLS (restless legs syndrome) 06/29/2016  . Secondary Parkinson disease ([place]) 06/29/2016  . Sleep apnea    Uses CPAP  . Uncontrolled type 2 diabetes mellitus with hyperglycemia, with long-term current use of insulin ([place]) 06/08/2016    Past Surgical History Past Surgical History:  Procedure Laterality Date  . ANKLE SURGERY Right    fusion  . APPENDECTOMY    . CHOLECYSTECTOMY    . EXPLORATORY LAPAROTOMY    . HEMORRHOID SURGERY    . MASTECTOMY MODIFIED RADICAL Left 04/14/2016   Procedure: LEFT MODIFIED RADICAL MASTECTOMY;  Surgeon: Aviva Signs, MD;  Location: [facility];  Service: General;  Laterality: Left;  . PORTACATH PLACEMENT N/A 05/26/2016   Procedure: INSERTION PORT-A-CATH RIGHT SUBCLAVIAN AND  DRAINAGE OF SEROMA LEFT CHEST;  Surgeon: Aviva Signs, MD;  Location: [facility];  Service: General;  Laterality: N/A;  . ROTATOR CUFF REPAIR    . TONSILLECTOMY    . TUBAL LIGATION      Family History Family History  Problem Relation Age of Onset  . Heart disease Mother   . Bipolar disorder Father   . Heart disease Father   . Prostate cancer Brother      Social History  reports that she quit smoking about 3 years ago. Her smoking use included cigarettes. She has a 50.00 pack-year smoking history. She has never used smokeless tobacco. She reports that she does not drink alcohol or use drugs.  Medications  Current Outpatient Medications:  .  allopurinol (ZYLOPRIM) 100 MG tablet, Take 100 mg by mouth daily., Disp: , Rfl:  .  aspirin EC 81 MG tablet, Take 81 mg by mouth daily., Disp: , Rfl:  .  carbidopa-levodopa (SINEMET IR) 25-100 MG tablet, Take 1 tablet by mouth 3 (three) times daily., Disp: 90 tablet, Rfl: 5 .  chlorproMAZINE (THORAZINE) 50 MG tablet, Take 100 mg by mouth at bedtime. , Disp: , Rfl:  .  citalopram (CELEXA) 10 MG tablet, Take 10 mg by mouth daily., Disp: ,  Rfl:  .  DOCEtaxel (TAXOTERE IV), Inject into the vein. Every 3 weeks, Disp: , Rfl:  .  ferrous sulfate 325 (65 FE) MG tablet, Take 325 mg by mouth daily with breakfast., Disp: , Rfl:  .  insulin degludec (TRESIBA FLEXTOUCH) 100 UNIT/ML SOPN FlexTouch Pen, Inject 50 Units into the skin at bedtime. , Disp: , Rfl:  .  levothyroxine (SYNTHROID, LEVOTHROID) 50 MCG tablet, Take 50 mcg by mouth daily before breakfast., Disp: , Rfl:  .  mirtazapine (REMERON) 15 MG tablet, Take 15 mg by mouth at bedtime., Disp: , Rfl:  .  niacin 500 MG CR capsule, Take 500 mg by mouth at bedtime., Disp: , Rfl:  .  nitrofurantoin (MACRODANTIN) 100 MG capsule, Take 100 mg by mouth daily., Disp: , Rfl:  .  NOVOLOG MIX 70/30 FLEXPEN (70-30) 100 UNIT/ML FlexPen, Inject 30 Units into the skin 2 (two) times daily with a meal. , Disp: , Rfl:  .  omeprazole (PRILOSEC) 40 MG capsule, Take 40 mg by mouth daily., Disp: , Rfl:  .  polyethylene glycol powder (GLYCOLAX/MIRALAX) powder, Take 17 g by mouth daily. , Disp: , Rfl:  .  simvastatin (ZOCOR) 40 MG tablet, Take 40 mg by mouth at bedtime., Disp: , Rfl:  .  temazepam (RESTORIL) 15 MG capsule, Take 15 mg by mouth at bedtime., Disp: , Rfl:  .  tiZANidine (ZANAFLEX) 4 MG tablet, Take 8 mg by mouth 2 (two) times daily., Disp: , Rfl:   Current Facility-Administered Medications:  .  sodium chloride flush (NS) 0.9 % injection 10 mL, 10 mL, Intravenous, PRN, Khali Perella, MD, 10 mL at 10/28/17 1359  Allergies Keflex [cephalexin]; Cogentin [benztropine]; Sulfa antibiotics; and Latex  Review of Systems Review of Systems - Oncology ROS negative other than fatigue   Physical Exam  Vitals Wt Readings from Last 3 Encounters:  10/28/17 198 lb (89.8 kg)  10/27/16 195 lb (88.5 kg)  08/26/16 186 lb (84.4 kg)   Temp Readings from Last 3 Encounters:  10/28/17 99.8 F (37.7 C) (Oral)  08/26/16 98.7 F (37.1 C) (Oral)  08/11/16 98.6 F (37 C) (Oral)   BP Readings from Last 3  Encounters:  10/28/17 135/68  10/27/16 113/71  08/26/16 (!) 142/81   Pulse Readings from Last 3 Encounters:  10/28/17 (!) 108  10/27/16 80  08/26/16 97   Constitutional: Well-developed, well-nourished, anxious, tremor noted HENT: Head: Normocephalic and atraumatic.  Mouth/Throat: No oropharyngeal exudate. Mucosa moist. Eyes: Pupils are equal, round, and reactive to light. Conjunctivae are normal. No scleral icterus.  Neck: Normal range of motion. Neck supple. No JVD present.  Cardiovascular: Normal rate, regular rhythm and normal heart sounds.  Exam reveals no gallop and no friction rub.   No murmur heard. Pulmonary/Chest: Effort normal and breath sounds normal. No respiratory distress. No wheezes.No rales.  Abdominal: Soft. Bowel sounds are normal. No distension. There is no tenderness. There is no guarding.  Musculoskeletal: No edema or tenderness.  Lymphadenopathy: No cervical, axillary or supraclavicular adenopathy.  Neurological: Alert and oriented to person, place, and time. No cranial nerve deficit.  Skin: Skin is warm and dry. No rash noted. No erythema. No pallor.  Psychiatric: Affect and judgment normal.  Breast exam:  Chaperone present.  Left mastectomy site healed not palpable nodules.  Pt reports some chest wall tenderness.  Right breast shows no dominant palpable masses.    Labs Office Visit on 10/28/2017  Component Date Value Ref Range Status  . LDH 10/28/2017 159  98 - 192 U/L Final   Performed at [facility], [address], [place], [place] [address]  . Sodium 10/28/2017 139  135 - 145 mmol/L Final  . Potassium 10/28/2017 4.7  3.5 - 5.1 mmol/L Final  . Chloride 10/28/2017 102  98 - 111 mmol/L Final  . CO2 10/28/2017 24  22 - 32 mmol/L Final  .  Glucose, Bld 10/28/2017 86  70 - 99 mg/dL Final  . BUN 10/28/2017 18  8 - 23 mg/dL Final  . Creatinine, Ser 10/28/2017 1.18* 0.44 - 1.00 mg/dL Final  . Calcium 10/28/2017 9.3  8.9 - 10.3 mg/dL Final  . Total Protein  10/28/2017 7.1  6.5 - 8.1 g/dL Final  . Albumin 10/28/2017 4.2  3.5 - 5.0 g/dL Final  . AST 10/28/2017 27  15 - 41 U/L Final  . ALT 10/28/2017 11  0 - 44 U/L Final  . Alkaline Phosphatase 10/28/2017 73  38 - 126 U/L Final  . Total Bilirubin 10/28/2017 0.6  0.3 - 1.2 mg/dL Final  . GFR calc non Af Amer 10/28/2017 46* >60 mL/min Final  . GFR calc Af Amer 10/28/2017 54* >60 mL/min Final   Comment: (NOTE) The eGFR has been calculated using the CKD EPI equation. This calculation has not been validated in all clinical situations. eGFR's persistently <60 mL/min signify possible Chronic Kidney Disease.   Georgiann Hahn gap 10/28/2017 13  5 - 15 Final   Performed at [facility], [address] [place], [place] [address]  . WBC 10/28/2017 16.6* 4.0 - 10.5 K/uL Final  . RBC 10/28/2017 4.60  3.87 - 5.11 MIL/uL Final  . Hemoglobin 10/28/2017 13.4  12.0 - 15.0 g/dL Final  . HCT 10/28/2017 40.8  36.0 - 46.0 % Final  . MCV 10/28/2017 88.7  78.0 - 100.0 fL Final  . MCH 10/28/2017 29.1  26.0 - 34.0 pg Final  . MCHC 10/28/2017 32.8  30.0 - 36.0 g/dL Final  . RDW 10/28/2017 15.1  11.5 - 15.5 % Final  . Platelets 10/28/2017 255  150 - 400 K/uL Final  . Neutrophils Relative % 10/28/2017 60  % Final  . Neutro Abs 10/28/2017 10.0* 1.7 - 7.7 K/uL Final  . Lymphocytes Relative 10/28/2017 29  % Final  . Lymphs Abs 10/28/2017 4.8* 0.7 - 4.0 K/uL Final  . Monocytes Relative 10/28/2017 10  % Final  . Monocytes Absolute 10/28/2017 1.6* 0.1 - 1.0 K/uL Final  . Eosinophils Relative 10/28/2017 1  % Final  . Eosinophils Absolute 10/28/2017 0.2  0.0 - 0.7 K/uL Final  . Basophils Relative 10/28/2017 0  % Final  . Basophils Absolute 10/28/2017 0.0  0.0 - 0.1 K/uL Final   Performed at [facility], [address], [place], [place] [address]     Pathology Orders Placed This Encounter  Procedures  . NM PET Image Initial (PI) Skull Base To Thigh    Standing Status:   Future    Standing Expiration Date:   10/28/2018     Order Specific Question:   If indicated for the ordered procedure, I authorize the administration of a radiopharmaceutical per Radiology protocol    Answer:   Yes    Order Specific Question:   Preferred imaging location?    Answer:   [facility]    Order Specific Question:   Radiology Contrast Protocol - do NOT remove file path    Answer:   [email or web address]  . DG Bone Density    JH/AMY       Standing Status:   Future    Standing Expiration Date:   10/28/2018    Order Specific Question:   Reason for Exam (SYMPTOM  OR DIAGNOSIS REQUIRED)    Answer:   breast cancer, postmenopausal    Order Specific Question:   Preferred imaging location?    Answer:   [place]  DIAG BREAST TOMO UNI RIGHT    Standing Status:   Future    Standing Expiration Date:   10/29/2018    Order Specific Question:   Reason for Exam (SYMPTOM  OR DIAGNOSIS REQUIRED)    Answer:   left breast cancer    Order Specific Question:   Preferred imaging location?    Answer:   [facility]  . CBC with Differential/Platelet    Standing Status:   Future    Number of Occurrences:   1    Standing Expiration Date:   10/29/2018  . Comprehensive metabolic panel    Standing Status:   Future    Number of Occurrences:   1    Standing Expiration Date:   10/29/2018  . Lactate dehydrogenase    Standing Status:   Future    Number of Occurrences:   1    Standing Expiration Date:   10/29/2018  . Cancer antigen 27.29    Standing Status:   Future    Number of Occurrences:   1    Standing Expiration Date:   10/29/2018  . Cancer antigen 15-3    Standing Status:   Future    Number of Occurrences:   1    Standing Expiration Date:   10/29/2018       Zoila Shutter MD

## 2017-10-28 NOTE — Patient Instructions (Signed)
[facility] Discharge Instructions  You saw Dr. Higgs today.   Thank you for choosing [facility] at [facility] to provide your oncology and hematology care.  To afford each patient quality time with our provider, please arrive at least 15 minutes before your scheduled appointment time.   If you have a lab appointment with the Cancer Center please come in thru the  Main Entrance and check in at the main information desk  You need to re-schedule your appointment should you arrive 10 or more minutes late.  We strive to give you quality time with our providers, and arriving late affects you and other patients whose appointments are after yours.  Also, if you no show three or more times for appointments you may be dismissed from the clinic at the providers discretion.     Again, thank you for choosing [facility].  Our hope is that these requests will decrease the amount of time that you wait before being seen by our physicians.       _____________________________________________________________  Should you have questions after your visit to [facility], please contact our office at [phone] between the hours of 8:00 a.m. and 4:30 p.m.  Voicemails left after 4:00 p.m. will not be returned until the following business day.  For prescription refill requests, have your pharmacy contact our office and allow 72 hours.    Cancer Center Support Programs:   > Cancer Support Group  2nd Tuesday of the month 1pm-2pm, Journey Room    

## 2017-10-28 NOTE — Progress Notes (Signed)
Margaret Mcdaniel presented for Portacath access and flush. Portacath located rt chest wall accessed with  H 20 needle. Good blood return present. Portacath flushed with 16ml NS and 500U/38ml Heparin and needle removed intact. Procedure without incident. Patient tolerated procedure well. Blood draw obtained.

## 2017-10-29 LAB — CANCER ANTIGEN 27.29: CAN 27.29: 16.7 U/mL (ref 0.0–38.6)

## 2017-10-29 LAB — CANCER ANTIGEN 15-3: CAN 15 3: 14.4 U/mL (ref 0.0–25.0)

## 2017-11-03 ENCOUNTER — Encounter (HOSPITAL_COMMUNITY): Payer: Self-pay | Admitting: *Deleted

## 2017-11-03 ENCOUNTER — Other Ambulatory Visit (HOSPITAL_COMMUNITY): Payer: Self-pay

## 2017-11-03 NOTE — Progress Notes (Signed)
Patient's son called today stating that Ms. Collison had a conversation with her children and she decided not to continue with any treatments. She doesn't want to have anything done, no test, no labs, no treatment. She feels like she can't take anything else.  Her son called to let us know so that we can cancel all appointments moving forward.  I explained to him that we could get her a referral to Arnoldo Morale' office to have her port removed and he states that is also something they talked about and she does want the port taken out.  I explained that their office would be in contact with him to schedule that moving forward.  If he should need anything from our office he was advised to not hesitate to give [place] a call.  He verbalizes understanding and expresses appreciation.

## 2017-11-21 ENCOUNTER — Other Ambulatory Visit (HOSPITAL_COMMUNITY): Payer: Self-pay

## 2017-11-29 ENCOUNTER — Ambulatory Visit (HOSPITAL_COMMUNITY): Payer: Self-pay | Admitting: Internal Medicine

## 2017-11-29 ENCOUNTER — Ambulatory Visit: Payer: PPO | Admitting: General Surgery

## 2017-12-13 ENCOUNTER — Encounter: Payer: Self-pay | Admitting: General Surgery

## 2017-12-13 ENCOUNTER — Ambulatory Visit (INDEPENDENT_AMBULATORY_CARE_PROVIDER_SITE_OTHER): Payer: PPO | Admitting: General Surgery

## 2017-12-13 VITALS — BP 131/66 | HR 120 | Temp 96.9°F | Resp 18 | Wt 200.0 lb

## 2017-12-13 DIAGNOSIS — C50912 Malignant neoplasm of unspecified site of left female breast: Secondary | ICD-10-CM | POA: Diagnosis not present

## 2017-12-13 DIAGNOSIS — C773 Secondary and unspecified malignant neoplasm of axilla and upper limb lymph nodes: Secondary | ICD-10-CM

## 2017-12-13 NOTE — H&P (Signed)
Margaret Mcdaniel; [id number]; 1951/03/02   HPI Patient is a 67 year old white female with a history of left breast cancer who was referred back to my care by oncology and Dr. Nevada Crane for Port-A-Cath removal.  She has finished with chemotherapy.  She currently has 0 out of 10 pain at the Port-A-Cath site.  No fever or chills have been noted. Past Medical History:  Diagnosis Date  . Anxiety   . Arthritis   . Breast cancer metastasized to axillary lymph node, left ([place]) 04/14/2016  . Cancer ([place])    left breast  . CKD (chronic kidney disease) stage 3, GFR 30-59 ml/min (HCC) 06/08/2016  . COPD (chronic obstructive pulmonary disease) ([place])   . Diabetes mellitus without complication ([place])   . GERD (gastroesophageal reflux disease)   . History of kidney stones   . Hypothyroidism   . Mental disorder   . Neuromuscular disorder ([place])   . PONV (postoperative nausea and vomiting)   . RLS (restless legs syndrome) 06/29/2016  . Secondary Parkinson disease ([place]) 06/29/2016  . Sleep apnea    Uses CPAP  . Uncontrolled type 2 diabetes mellitus with hyperglycemia, with long-term current use of insulin ([place]) 06/08/2016    Past Surgical History:  Procedure Laterality Date  . ANKLE SURGERY Right    fusion  . APPENDECTOMY    . CHOLECYSTECTOMY    . EXPLORATORY LAPAROTOMY    . HEMORRHOID SURGERY    . MASTECTOMY MODIFIED RADICAL Left 04/14/2016   Procedure: LEFT MODIFIED RADICAL MASTECTOMY;  Surgeon: Aviva Signs, MD;  Location: [facility];  Service: General;  Laterality: Left;  . PORTACATH PLACEMENT N/A 05/26/2016   Procedure: INSERTION PORT-A-CATH RIGHT SUBCLAVIAN AND  DRAINAGE OF SEROMA LEFT CHEST;  Surgeon: Aviva Signs, MD;  Location: [facility];  Service: General;  Laterality: N/A;  . ROTATOR CUFF REPAIR    . TONSILLECTOMY    . TUBAL LIGATION      Family History  Problem Relation Age of Onset  . Heart disease Mother   . Bipolar disorder Father   . Heart disease Father   . Prostate cancer Brother      Current Outpatient Medications on File Prior to Visit  Medication Sig Dispense Refill  . allopurinol (ZYLOPRIM) 100 MG tablet Take 100 mg by mouth daily.    Marland Kitchen aspirin EC 81 MG tablet Take 81 mg by mouth daily.    . carbidopa-levodopa (SINEMET IR) 25-100 MG tablet Take 1 tablet by mouth 3 (three) times daily. 90 tablet 5  . chlorproMAZINE (THORAZINE) 50 MG tablet Take 100 mg by mouth at bedtime.     . citalopram (CELEXA) 10 MG tablet Take 10 mg by mouth daily.    . DOCEtaxel (TAXOTERE IV) Inject into the vein. Every 3 weeks    . ferrous sulfate 325 (65 FE) MG tablet Take 325 mg by mouth daily with breakfast.    . insulin degludec (TRESIBA FLEXTOUCH) 100 UNIT/ML SOPN FlexTouch Pen Inject 50 Units into the skin at bedtime.     Marland Kitchen levothyroxine (SYNTHROID, LEVOTHROID) 50 MCG tablet Take 50 mcg by mouth daily before breakfast.    . mirtazapine (REMERON) 15 MG tablet Take 15 mg by mouth at bedtime.    . niacin 500 MG CR capsule Take 500 mg by mouth at bedtime.    . nitrofurantoin (MACRODANTIN) 100 MG capsule Take 100 mg by mouth daily.    Marland Kitchen NOVOLOG MIX 70/30 FLEXPEN (70-30) 100 UNIT/ML FlexPen Inject 30 Units into the skin 2 (two)  times daily with a meal.     . omeprazole (PRILOSEC) 40 MG capsule Take 40 mg by mouth daily.    . polyethylene glycol powder (GLYCOLAX/MIRALAX) powder Take 17 g by mouth daily.     . simvastatin (ZOCOR) 40 MG tablet Take 40 mg by mouth at bedtime.    . temazepam (RESTORIL) 15 MG capsule Take 15 mg by mouth at bedtime.    Marland Kitchen tiZANidine (ZANAFLEX) 4 MG tablet Take 8 mg by mouth 2 (two) times daily.     No current facility-administered medications on file prior to visit.     Allergies  Allergen Reactions  . Keflex [Cephalexin] Anaphylaxis    Pt "codes" on this medication.  . Cogentin [Benztropine]     confusion  . Sulfa Antibiotics Hives  . Latex Rash    Social History   Substance and Sexual Activity  Alcohol Use No    Social History   Tobacco Use   Smoking Status Former Smoker  . Packs/day: 1.00  . Years: 50.00  . Pack years: 50.00  . Types: Cigarettes  . Last attempt to quit: 04/12/2014  . Years since quitting: 3.6  Smokeless Tobacco Never Used    Review of Systems  Constitutional: Positive for malaise/fatigue.  HENT: Negative.   Eyes: Negative.   Respiratory: Positive for shortness of breath.   Cardiovascular: Negative.   Gastrointestinal: Negative.   Genitourinary: Positive for frequency.  Musculoskeletal: Negative.   Skin: Negative.   Neurological: Positive for tremors.  Endo/Heme/Allergies: Negative.   Psychiatric/Behavioral: Negative.     Objective   Vitals:   12/13/17 1026  BP: 131/66  Pulse: (!) 120  Resp: 18  Temp: (!) 96.9 F (36.1 C)    Physical Exam  Constitutional: She is oriented to person, place, and time. She appears well-developed and well-nourished. No distress.  HENT:  Head: Normocephalic and atraumatic.  Cardiovascular: Normal rate, regular rhythm and normal heart sounds. Exam reveals no gallop and no friction rub.  No murmur heard. Pulmonary/Chest: Effort normal and breath sounds normal. No stridor. No respiratory distress. She has no wheezes. She has no rales.  Port-A-Cath in place right upper chest.  Neurological: She is alert and oriented to person, place, and time.  Skin: Skin is warm and dry.  Vitals reviewed.  Oncology notes reviewed Assessment  Left breast carcinoma, finished with chemotherapy Plan   Patient is scheduled for Port-A-Cath removal in the minor procedure room on 12/21/2017.  The risks and benefits of the procedure were fully explained to the patient, who gave informed consent.

## 2017-12-13 NOTE — Progress Notes (Signed)
Margaret Mcdaniel; [id number]; December 28, 1950   HPI Patient is a 67 year old white female with a history of left breast cancer who was referred back to my care by oncology and Dr. Nevada Crane for Port-A-Cath removal.  She has finished with chemotherapy.  She currently has 0 out of 10 pain at the Port-A-Cath site.  No fever or chills have been noted. Past Medical History:  Diagnosis Date  . Anxiety   . Arthritis   . Breast cancer metastasized to axillary lymph node, left ([place]) 04/14/2016  . Cancer ([place])    left breast  . CKD (chronic kidney disease) stage 3, GFR 30-59 ml/min (HCC) 06/08/2016  . COPD (chronic obstructive pulmonary disease) ([place])   . Diabetes mellitus without complication ([place])   . GERD (gastroesophageal reflux disease)   . History of kidney stones   . Hypothyroidism   . Mental disorder   . Neuromuscular disorder ([place])   . PONV (postoperative nausea and vomiting)   . RLS (restless legs syndrome) 06/29/2016  . Secondary Parkinson disease ([place]) 06/29/2016  . Sleep apnea    Uses CPAP  . Uncontrolled type 2 diabetes mellitus with hyperglycemia, with long-term current use of insulin ([place]) 06/08/2016    Past Surgical History:  Procedure Laterality Date  . ANKLE SURGERY Right    fusion  . APPENDECTOMY    . CHOLECYSTECTOMY    . EXPLORATORY LAPAROTOMY    . HEMORRHOID SURGERY    . MASTECTOMY MODIFIED RADICAL Left 04/14/2016   Procedure: LEFT MODIFIED RADICAL MASTECTOMY;  Surgeon: Aviva Signs, MD;  Location: [facility];  Service: General;  Laterality: Left;  . PORTACATH PLACEMENT N/A 05/26/2016   Procedure: INSERTION PORT-A-CATH RIGHT SUBCLAVIAN AND  DRAINAGE OF SEROMA LEFT CHEST;  Surgeon: Aviva Signs, MD;  Location: [facility];  Service: General;  Laterality: N/A;  . ROTATOR CUFF REPAIR    . TONSILLECTOMY    . TUBAL LIGATION      Family History  Problem Relation Age of Onset  . Heart disease Mother   . Bipolar disorder Father   . Heart disease Father   . Prostate cancer Brother      Current Outpatient Medications on File Prior to Visit  Medication Sig Dispense Refill  . allopurinol (ZYLOPRIM) 100 MG tablet Take 100 mg by mouth daily.    Marland Kitchen aspirin EC 81 MG tablet Take 81 mg by mouth daily.    . carbidopa-levodopa (SINEMET IR) 25-100 MG tablet Take 1 tablet by mouth 3 (three) times daily. 90 tablet 5  . chlorproMAZINE (THORAZINE) 50 MG tablet Take 100 mg by mouth at bedtime.     . citalopram (CELEXA) 10 MG tablet Take 10 mg by mouth daily.    . DOCEtaxel (TAXOTERE IV) Inject into the vein. Every 3 weeks    . ferrous sulfate 325 (65 FE) MG tablet Take 325 mg by mouth daily with breakfast.    . insulin degludec (TRESIBA FLEXTOUCH) 100 UNIT/ML SOPN FlexTouch Pen Inject 50 Units into the skin at bedtime.     Marland Kitchen levothyroxine (SYNTHROID, LEVOTHROID) 50 MCG tablet Take 50 mcg by mouth daily before breakfast.    . mirtazapine (REMERON) 15 MG tablet Take 15 mg by mouth at bedtime.    . niacin 500 MG CR capsule Take 500 mg by mouth at bedtime.    . nitrofurantoin (MACRODANTIN) 100 MG capsule Take 100 mg by mouth daily.    Marland Kitchen NOVOLOG MIX 70/30 FLEXPEN (70-30) 100 UNIT/ML FlexPen Inject 30 Units into the skin 2 (two)  times daily with a meal.     . omeprazole (PRILOSEC) 40 MG capsule Take 40 mg by mouth daily.    . polyethylene glycol powder (GLYCOLAX/MIRALAX) powder Take 17 g by mouth daily.     . simvastatin (ZOCOR) 40 MG tablet Take 40 mg by mouth at bedtime.    . temazepam (RESTORIL) 15 MG capsule Take 15 mg by mouth at bedtime.    Marland Kitchen tiZANidine (ZANAFLEX) 4 MG tablet Take 8 mg by mouth 2 (two) times daily.     No current facility-administered medications on file prior to visit.     Allergies  Allergen Reactions  . Keflex [Cephalexin] Anaphylaxis    Pt "codes" on this medication.  . Cogentin [Benztropine]     confusion  . Sulfa Antibiotics Hives  . Latex Rash    Social History   Substance and Sexual Activity  Alcohol Use No    Social History   Tobacco Use   Smoking Status Former Smoker  . Packs/day: 1.00  . Years: 50.00  . Pack years: 50.00  . Types: Cigarettes  . Last attempt to quit: 04/12/2014  . Years since quitting: 3.6  Smokeless Tobacco Never Used    Review of Systems  Constitutional: Positive for malaise/fatigue.  HENT: Negative.   Eyes: Negative.   Respiratory: Positive for shortness of breath.   Cardiovascular: Negative.   Gastrointestinal: Negative.   Genitourinary: Positive for frequency.  Musculoskeletal: Negative.   Skin: Negative.   Neurological: Positive for tremors.  Endo/Heme/Allergies: Negative.   Psychiatric/Behavioral: Negative.     Objective   Vitals:   12/13/17 1026  BP: 131/66  Pulse: (!) 120  Resp: 18  Temp: (!) 96.9 F (36.1 C)    Physical Exam  Constitutional: She is oriented to person, place, and time. She appears well-developed and well-nourished. No distress.  HENT:  Head: Normocephalic and atraumatic.  Cardiovascular: Normal rate, regular rhythm and normal heart sounds. Exam reveals no gallop and no friction rub.  No murmur heard. Pulmonary/Chest: Effort normal and breath sounds normal. No stridor. No respiratory distress. She has no wheezes. She has no rales.  Port-A-Cath in place right upper chest.  Neurological: She is alert and oriented to person, place, and time.  Skin: Skin is warm and dry.  Vitals reviewed.  Oncology notes reviewed Assessment  Left breast carcinoma, finished with chemotherapy Plan   Patient is scheduled for Port-A-Cath removal in the minor procedure room on 12/21/2017.  The risks and benefits of the procedure were fully explained to the patient, who gave informed consent.

## 2017-12-13 NOTE — Patient Instructions (Signed)
Implanted Port Removal Implanted port removal is a procedure to remove the port and catheter (port-a-cath) that is implanted under your skin. The port is a small disc under your skin that can be punctured with a needle. It is connected to a vein in your chest or neck by a small flexible tube (catheter). The port-a-cath is used for treatment through an IV tube and for taking blood samples. Your health care provider will remove the port-a-cath if:  You no longer need it for treatment.  It is not working properly.  The area around it gets infected.  Tell a health care provider about:  Any allergies you have.  All medicines you are taking, including vitamins, herbs, eye drops, creams, and over-the-counter medicines.  Any problems you or family members have had with anesthetic medicines.  Any blood disorders you have.  Any surgeries you have had.  Any medical conditions you have.  Whether you are pregnant or may be pregnant. What are the risks? Generally, this is a safe procedure. However, problems may occur, including:  Infection.  Bleeding.  Allergic reactions to anesthetic medicines.  Damage to nerves or blood vessels.  What happens before the procedure?  You will have: ? A physical exam. ? Blood tests. ? Imaging tests, including a chest X-ray.  Follow instructions from your health care provider about eating or drinking restrictions.  Ask your health care provider about: ? Changing or stopping your regular medicines. This is especially important if you are taking diabetes medicines or blood thinners. ? Taking medicines such as aspirin and ibuprofen. These medicines can thin your blood. Do not take these medicines before your procedure if your surgeon instructs you not to.  Ask your health care provider how your surgical site will be marked or identified.  You may be given antibiotic medicine to help prevent infection.  Plan to have someone take you home after the  procedure.  If you will be going home right after the procedure, plan to have someone stay with you for 24 hours. What happens during the procedure?  To reduce your risk of infection: ? Your health care team will wash or sanitize their hands. ? Your skin will be washed with soap.  You may be given one or more of the following: ? A medicine to help you relax (sedative). ? A medicine to numb the area (local anesthetic).  A small cut (incision) will be made at the site of your port-a-cath.  The port-a-cath and the catheter that has been inside your vein will gently be removed.  The incision will be closed with stitches (sutures), adhesive strips, or skin glue.  A bandage (dressing) will be placed over the incision. The procedure may vary among health care providers and hospitals. What happens after the procedure?  Your blood pressure, heart rate, breathing rate, and blood oxygen level will be monitored often until the medicines you were given have worn off.  Do not drive for 24 hours if you received a sedative. This information is not intended to replace advice given to you by your health care provider. Make sure you discuss any questions you have with your health care provider. Document Released: 02/03/2015 Document Revised: 07/31/2015 Document Reviewed: 11/27/2014 Elsevier Interactive Patient Education  2018 Elsevier Inc[employer]  

## 2017-12-19 ENCOUNTER — Ambulatory Visit (HOSPITAL_COMMUNITY)
Admission: RE | Admit: 2017-12-19 | Discharge: 2017-12-19 | Disposition: A | Discharge status: Home / Self Care | Payer: PPO | Source: Ambulatory Visit | Attending: General Surgery | Admitting: General Surgery

## 2017-12-19 ENCOUNTER — Encounter (HOSPITAL_COMMUNITY)
Admission: RE | Disposition: A | Discharge status: Home / Self Care | Payer: Self-pay | Source: Ambulatory Visit | Attending: General Surgery

## 2017-12-19 ENCOUNTER — Encounter (HOSPITAL_COMMUNITY): Payer: Self-pay | Admitting: *Deleted

## 2017-12-19 DIAGNOSIS — J449 Chronic obstructive pulmonary disease, unspecified: Secondary | ICD-10-CM | POA: Diagnosis not present

## 2017-12-19 DIAGNOSIS — Z9012 Acquired absence of left breast and nipple: Secondary | ICD-10-CM | POA: Insufficient documentation

## 2017-12-19 DIAGNOSIS — Z794 Long term (current) use of insulin: Secondary | ICD-10-CM | POA: Insufficient documentation

## 2017-12-19 DIAGNOSIS — E1122 Type 2 diabetes mellitus with diabetic chronic kidney disease: Secondary | ICD-10-CM | POA: Diagnosis not present

## 2017-12-19 DIAGNOSIS — Z452 Encounter for adjustment and management of vascular access device: Secondary | ICD-10-CM | POA: Insufficient documentation

## 2017-12-19 DIAGNOSIS — G2581 Restless legs syndrome: Secondary | ICD-10-CM | POA: Insufficient documentation

## 2017-12-19 DIAGNOSIS — E039 Hypothyroidism, unspecified: Secondary | ICD-10-CM | POA: Diagnosis not present

## 2017-12-19 DIAGNOSIS — Z882 Allergy status to sulfonamides status: Secondary | ICD-10-CM | POA: Diagnosis not present

## 2017-12-19 DIAGNOSIS — Z79890 Hormone replacement therapy (postmenopausal): Secondary | ICD-10-CM | POA: Insufficient documentation

## 2017-12-19 DIAGNOSIS — C50912 Malignant neoplasm of unspecified site of left female breast: Secondary | ICD-10-CM | POA: Diagnosis not present

## 2017-12-19 DIAGNOSIS — Z87891 Personal history of nicotine dependence: Secondary | ICD-10-CM | POA: Insufficient documentation

## 2017-12-19 DIAGNOSIS — Z9221 Personal history of antineoplastic chemotherapy: Secondary | ICD-10-CM | POA: Diagnosis not present

## 2017-12-19 DIAGNOSIS — Z7982 Long term (current) use of aspirin: Secondary | ICD-10-CM | POA: Diagnosis not present

## 2017-12-19 DIAGNOSIS — F419 Anxiety disorder, unspecified: Secondary | ICD-10-CM | POA: Diagnosis not present

## 2017-12-19 DIAGNOSIS — N183 Chronic kidney disease, stage 3 (moderate): Secondary | ICD-10-CM | POA: Insufficient documentation

## 2017-12-19 DIAGNOSIS — K219 Gastro-esophageal reflux disease without esophagitis: Secondary | ICD-10-CM | POA: Diagnosis not present

## 2017-12-19 DIAGNOSIS — G20 Parkinson's disease: Secondary | ICD-10-CM | POA: Diagnosis not present

## 2017-12-19 DIAGNOSIS — M1990 Unspecified osteoarthritis, unspecified site: Secondary | ICD-10-CM | POA: Insufficient documentation

## 2017-12-19 DIAGNOSIS — Z79899 Other long term (current) drug therapy: Secondary | ICD-10-CM | POA: Insufficient documentation

## 2017-12-19 DIAGNOSIS — Z881 Allergy status to other antibiotic agents status: Secondary | ICD-10-CM | POA: Diagnosis not present

## 2017-12-19 DIAGNOSIS — G4730 Sleep apnea, unspecified: Secondary | ICD-10-CM | POA: Diagnosis not present

## 2017-12-19 HISTORY — PX: PORT-A-CATH REMOVAL: SHX5289

## 2017-12-19 LAB — GLUCOSE, CAPILLARY: GLUCOSE-CAPILLARY: 134 mg/dL — AB (ref 70–99)

## 2017-12-19 SURGERY — MINOR REMOVAL PORT-A-CATH
Anesthesia: LOCAL | Laterality: Right

## 2017-12-19 MED ORDER — LIDOCAINE HCL (PF) 1 % IJ SOLN
INTRAMUSCULAR | Status: DC | PRN
  Administered 2017-12-19: 6 mL

## 2017-12-19 MED ORDER — LIDOCAINE HCL (PF) 1 % IJ SOLN
INTRAMUSCULAR | Status: AC
  Filled 2017-12-19: qty 30

## 2017-12-19 MED ORDER — TRAMADOL HCL 50 MG PO TABS: 50.0000 mg | ORAL_TABLET | Freq: Two times a day (BID) | ORAL | 0 refills | Status: DC | PRN

## 2017-12-19 SURGICAL SUPPLY — 15 items
CHLORAPREP W/TINT 10.5 ML (MISCELLANEOUS) ×2 IMPLANT
CLOTH BEACON ORANGE TIMEOUT ST (SAFETY) ×2 IMPLANT
DECANTER SPIKE VIAL GLASS SM (MISCELLANEOUS) ×2 IMPLANT
DERMABOND ADVANCED (GAUZE/BANDAGES/DRESSINGS) ×1
DERMABOND ADVANCED .7 DNX12 (GAUZE/BANDAGES/DRESSINGS) ×1 IMPLANT
DRAPE PROXIMA HALF (DRAPES) ×2 IMPLANT
GLOVE BIOGEL PI IND STRL 7.0 (GLOVE) ×1 IMPLANT
GLOVE BIOGEL PI INDICATOR 7.0 (GLOVE) ×1
GOWN STRL REUS W/TWL LRG LVL3 (GOWN DISPOSABLE) ×2 IMPLANT
NEEDLE HYPO 25X1 1.5 SAFETY (NEEDLE) ×2 IMPLANT
SUT MNCRL AB 4-0 PS2 18 (SUTURE) ×2 IMPLANT
SUT VIC AB 3-0 SH 27 (SUTURE) ×1
SUT VIC AB 3-0 SH 27X BRD (SUTURE) ×1 IMPLANT
SYR CONTROL 10ML LL (SYRINGE) ×2 IMPLANT
TOWEL OR 17X26 4PK STRL BLUE (TOWEL DISPOSABLE) ×2 IMPLANT

## 2017-12-19 NOTE — Op Note (Signed)
Patient:  Margaret Mcdaniel  DOB:  June 19, 1950  MRN:  [id number]   Preop Diagnosis: Left breast carcinoma, finished with chemotherapy  Postop Diagnosis: Same  Procedure: Port-A-Cath removal  Surgeon: Aviva Signs, MD  Anes: Local  Indications: Patient is a 67 year old white female who was treated for left breast carcinoma.  She now presents for Port-A-Cath removal.  The risks and benefits of the procedure including bleeding and infection were fully explained to the patient, who gave informed consent.  Procedure note: Patient was placed in supine position.  The right upper chest was prepped and draped using the usual sterile technique with DuraPrep.  Surgical site confirmation was performed.  1% Xylocaine was used for local anesthesia.  An incision was made to the previous surgical incision site.  Dissection was taken down to the Port-A-Cath.  The Port-A-Cath was removed in total without difficulty.  It was disposed of.  The subcutaneous layer was reapproximated using a 3-0 Vicryl interrupted suture.  The skin was closed using a 4-0 Monocryl subcuticular suture.  Dermabond was applied.  All tape and needle counts were correct at the end of the procedure.  The patient tolerated the procedure well and was discharged from the minor procedure room.  Complications: None  EBL: Minimal  Specimen: None

## 2017-12-19 NOTE — Discharge Instructions (Signed)
Implanted Port Removal, Care After °Refer to this sheet in the next few weeks. These instructions provide you with information about caring for yourself after your procedure. Your health care provider may also give you more specific instructions. Your treatment has been planned according to current medical practices, but problems sometimes occur. Call your health care provider if you have any problems or questions after your procedure. °What can I expect after the procedure? °After the procedure, it is common to have: °· Soreness or pain near your incision. °· Some swelling or bruising near your incision. ° °Follow these instructions at home: °Medicines °· Take over-the-counter and prescription medicines only as told by your health care provider. °· If you were prescribed an antibiotic medicine, take it as told by your health care provider. Do not stop taking the antibiotic even if you start to feel better. °Bathing °· Do not take baths, swim, or use a hot tub until your health care provider approves. Ask your health care provider if you can take showers. You may only be allowed to take sponge baths for bathing. °Incision care °· Follow instructions from your health care provider about how to take care of your incision. Make sure you: °? Wash your hands with soap and water before you change your bandage (dressing). If soap and water are not available, use hand sanitizer. °? Change your dressing as told by your health care provider. °? Keep your dressing dry. °? Leave stitches (sutures), skin glue, or adhesive strips in place. These skin closures may need to stay in place for 2 weeks or longer. If adhesive strip edges start to loosen and curl up, you may trim the loose edges. Do not remove adhesive strips completely unless your health care provider tells you to do that. °· Check your incision area every day for signs of infection. Check for: °? More redness, swelling, or pain. °? More fluid or  blood. °? Warmth. °? Pus or a bad smell. °Driving °· If you received a sedative, do not drive for 24 hours after the procedure. °· If you did not receive a sedative, ask your health care provider when it is safe to drive. °Activity °· Return to your normal activities as told by your health care provider. Ask your health care provider what activities are safe for you. °· Until your health care provider says it is safe: °? Do not lift anything that is heavier than 10 lb (4.5 kg). °? Do not do activities that involve lifting your arms over your head. °General instructions °· Do not use any tobacco products, such as cigarettes, chewing tobacco, and e-cigarettes. Tobacco can delay healing. If you need help quitting, ask your health care provider. °· Keep all follow-up visits as told by your health care provider. This is important. °Contact a health care provider if: °· You have more redness, swelling, or pain around your incision. °· You have more fluid or blood coming from your incision. °· Your incision feels warm to the touch. °· You have pus or a bad smell coming from your incision. °· You have a fever. °· You have pain that is not relieved by your pain medicine. °Get help right away if: °· You have chest pain. °· You have difficulty breathing. °This information is not intended to replace advice given to you by your health care provider. Make sure you discuss any questions you have with your health care provider. °Document Released: 02/03/2015 Document Revised: 07/31/2015 Document Reviewed: 11/27/2014 °Elsevier Interactive Patient   Education © 2018 Elsevier Inc. ° °

## 2017-12-19 NOTE — Interval H&P Note (Signed)
History and Physical Interval Note:  12/19/2017 8:26 AM  Margaret Mcdaniel  has presented today for surgery, with the diagnosis of left breast cancer  The various methods of treatment have been discussed with the patient and family. After consideration of risks, benefits and other options for treatment, the patient has consented to  Procedure(s): MINOR REMOVAL PORT-A-CATH (Right) as a surgical intervention .  The patient's history has been reviewed, patient examined, no change in status, stable for surgery.  I have reviewed the patient's chart and labs.  Questions were answered to the patient's satisfaction.     Aviva Signs

## 2017-12-20 DIAGNOSIS — G4733 Obstructive sleep apnea (adult) (pediatric): Secondary | ICD-10-CM | POA: Diagnosis not present

## 2017-12-27 ENCOUNTER — Encounter (HOSPITAL_COMMUNITY): Payer: Self-pay | Admitting: General Surgery

## 2018-03-02 DIAGNOSIS — G4733 Obstructive sleep apnea (adult) (pediatric): Secondary | ICD-10-CM | POA: Diagnosis not present

## 2018-03-20 DIAGNOSIS — C50919 Malignant neoplasm of unspecified site of unspecified female breast: Secondary | ICD-10-CM | POA: Diagnosis not present

## 2018-03-20 DIAGNOSIS — E782 Mixed hyperlipidemia: Secondary | ICD-10-CM | POA: Diagnosis not present

## 2018-03-20 DIAGNOSIS — I10 Essential (primary) hypertension: Secondary | ICD-10-CM | POA: Diagnosis not present

## 2018-03-20 DIAGNOSIS — E039 Hypothyroidism, unspecified: Secondary | ICD-10-CM | POA: Diagnosis not present

## 2018-03-20 DIAGNOSIS — R251 Tremor, unspecified: Secondary | ICD-10-CM | POA: Diagnosis not present

## 2018-03-20 DIAGNOSIS — N10 Acute tubulo-interstitial nephritis: Secondary | ICD-10-CM | POA: Diagnosis not present

## 2018-03-20 DIAGNOSIS — M109 Gout, unspecified: Secondary | ICD-10-CM | POA: Diagnosis not present

## 2018-03-20 DIAGNOSIS — G250 Essential tremor: Secondary | ICD-10-CM | POA: Diagnosis not present

## 2018-03-20 DIAGNOSIS — G20 Parkinson's disease: Secondary | ICD-10-CM | POA: Diagnosis not present

## 2018-03-20 DIAGNOSIS — D509 Iron deficiency anemia, unspecified: Secondary | ICD-10-CM | POA: Diagnosis not present

## 2018-03-20 DIAGNOSIS — E1165 Type 2 diabetes mellitus with hyperglycemia: Secondary | ICD-10-CM | POA: Diagnosis not present

## 2018-03-20 DIAGNOSIS — K219 Gastro-esophageal reflux disease without esophagitis: Secondary | ICD-10-CM | POA: Diagnosis not present

## 2018-03-24 DIAGNOSIS — N183 Chronic kidney disease, stage 3 (moderate): Secondary | ICD-10-CM | POA: Diagnosis not present

## 2018-03-24 DIAGNOSIS — E782 Mixed hyperlipidemia: Secondary | ICD-10-CM | POA: Diagnosis not present

## 2018-03-24 DIAGNOSIS — R945 Abnormal results of liver function studies: Secondary | ICD-10-CM | POA: Diagnosis not present

## 2018-03-24 DIAGNOSIS — M109 Gout, unspecified: Secondary | ICD-10-CM | POA: Diagnosis not present

## 2018-03-24 DIAGNOSIS — E1165 Type 2 diabetes mellitus with hyperglycemia: Secondary | ICD-10-CM | POA: Diagnosis not present

## 2018-03-24 DIAGNOSIS — K219 Gastro-esophageal reflux disease without esophagitis: Secondary | ICD-10-CM | POA: Diagnosis not present

## 2018-03-24 DIAGNOSIS — E1122 Type 2 diabetes mellitus with diabetic chronic kidney disease: Secondary | ICD-10-CM | POA: Diagnosis not present

## 2018-03-24 DIAGNOSIS — C50919 Malignant neoplasm of unspecified site of unspecified female breast: Secondary | ICD-10-CM | POA: Diagnosis not present

## 2018-03-24 DIAGNOSIS — G20 Parkinson's disease: Secondary | ICD-10-CM | POA: Diagnosis not present

## 2018-03-24 DIAGNOSIS — R739 Hyperglycemia, unspecified: Secondary | ICD-10-CM | POA: Diagnosis not present

## 2018-03-24 DIAGNOSIS — Z23 Encounter for immunization: Secondary | ICD-10-CM | POA: Diagnosis not present

## 2018-03-24 DIAGNOSIS — E039 Hypothyroidism, unspecified: Secondary | ICD-10-CM | POA: Diagnosis not present

## 2018-04-24 DIAGNOSIS — G250 Essential tremor: Secondary | ICD-10-CM | POA: Diagnosis not present

## 2018-04-24 DIAGNOSIS — G20 Parkinson's disease: Secondary | ICD-10-CM | POA: Diagnosis not present

## 2018-07-21 DIAGNOSIS — I10 Essential (primary) hypertension: Secondary | ICD-10-CM | POA: Diagnosis not present

## 2018-07-21 DIAGNOSIS — D509 Iron deficiency anemia, unspecified: Secondary | ICD-10-CM | POA: Diagnosis not present

## 2018-07-21 DIAGNOSIS — E1165 Type 2 diabetes mellitus with hyperglycemia: Secondary | ICD-10-CM | POA: Diagnosis not present

## 2018-07-21 DIAGNOSIS — E1122 Type 2 diabetes mellitus with diabetic chronic kidney disease: Secondary | ICD-10-CM | POA: Diagnosis not present

## 2018-07-21 DIAGNOSIS — E039 Hypothyroidism, unspecified: Secondary | ICD-10-CM | POA: Diagnosis not present

## 2018-07-21 DIAGNOSIS — E782 Mixed hyperlipidemia: Secondary | ICD-10-CM | POA: Diagnosis not present

## 2018-07-28 DIAGNOSIS — G4733 Obstructive sleep apnea (adult) (pediatric): Secondary | ICD-10-CM | POA: Diagnosis not present

## 2018-07-28 DIAGNOSIS — E782 Mixed hyperlipidemia: Secondary | ICD-10-CM | POA: Diagnosis not present

## 2018-07-28 DIAGNOSIS — D72829 Elevated white blood cell count, unspecified: Secondary | ICD-10-CM | POA: Diagnosis not present

## 2018-07-28 DIAGNOSIS — E039 Hypothyroidism, unspecified: Secondary | ICD-10-CM | POA: Diagnosis not present

## 2018-07-28 DIAGNOSIS — G20 Parkinson's disease: Secondary | ICD-10-CM | POA: Diagnosis not present

## 2018-07-28 DIAGNOSIS — M1A00X0 Idiopathic chronic gout, unspecified site, without tophus (tophi): Secondary | ICD-10-CM | POA: Diagnosis not present

## 2018-07-28 DIAGNOSIS — Z0001 Encounter for general adult medical examination with abnormal findings: Secondary | ICD-10-CM | POA: Diagnosis not present

## 2018-07-28 DIAGNOSIS — N183 Chronic kidney disease, stage 3 (moderate): Secondary | ICD-10-CM | POA: Diagnosis not present

## 2018-07-28 DIAGNOSIS — E1122 Type 2 diabetes mellitus with diabetic chronic kidney disease: Secondary | ICD-10-CM | POA: Diagnosis not present

## 2018-07-28 DIAGNOSIS — K219 Gastro-esophageal reflux disease without esophagitis: Secondary | ICD-10-CM | POA: Diagnosis not present

## 2018-07-28 DIAGNOSIS — F319 Bipolar disorder, unspecified: Secondary | ICD-10-CM | POA: Diagnosis not present

## 2018-07-28 DIAGNOSIS — E1165 Type 2 diabetes mellitus with hyperglycemia: Secondary | ICD-10-CM | POA: Diagnosis not present

## 2018-11-10 DIAGNOSIS — G250 Essential tremor: Secondary | ICD-10-CM | POA: Diagnosis not present

## 2018-11-10 DIAGNOSIS — G20 Parkinson's disease: Secondary | ICD-10-CM | POA: Diagnosis not present

## 2018-11-10 DIAGNOSIS — C50919 Malignant neoplasm of unspecified site of unspecified female breast: Secondary | ICD-10-CM | POA: Diagnosis not present

## 2018-11-10 DIAGNOSIS — E782 Mixed hyperlipidemia: Secondary | ICD-10-CM | POA: Diagnosis not present

## 2018-11-10 DIAGNOSIS — M109 Gout, unspecified: Secondary | ICD-10-CM | POA: Diagnosis not present

## 2018-11-10 DIAGNOSIS — I10 Essential (primary) hypertension: Secondary | ICD-10-CM | POA: Diagnosis not present

## 2018-11-10 DIAGNOSIS — E1165 Type 2 diabetes mellitus with hyperglycemia: Secondary | ICD-10-CM | POA: Diagnosis not present

## 2018-11-10 DIAGNOSIS — R251 Tremor, unspecified: Secondary | ICD-10-CM | POA: Diagnosis not present

## 2018-11-10 DIAGNOSIS — M1000 Idiopathic gout, unspecified site: Secondary | ICD-10-CM | POA: Diagnosis not present

## 2018-11-10 DIAGNOSIS — E039 Hypothyroidism, unspecified: Secondary | ICD-10-CM | POA: Diagnosis not present

## 2018-11-10 DIAGNOSIS — D509 Iron deficiency anemia, unspecified: Secondary | ICD-10-CM | POA: Diagnosis not present

## 2018-11-10 DIAGNOSIS — K219 Gastro-esophageal reflux disease without esophagitis: Secondary | ICD-10-CM | POA: Diagnosis not present

## 2018-12-20 DIAGNOSIS — R251 Tremor, unspecified: Secondary | ICD-10-CM | POA: Diagnosis not present

## 2018-12-20 DIAGNOSIS — E039 Hypothyroidism, unspecified: Secondary | ICD-10-CM | POA: Diagnosis not present

## 2018-12-20 DIAGNOSIS — N10 Acute tubulo-interstitial nephritis: Secondary | ICD-10-CM | POA: Diagnosis not present

## 2018-12-20 DIAGNOSIS — G250 Essential tremor: Secondary | ICD-10-CM | POA: Diagnosis not present

## 2018-12-20 DIAGNOSIS — K219 Gastro-esophageal reflux disease without esophagitis: Secondary | ICD-10-CM | POA: Diagnosis not present

## 2018-12-20 DIAGNOSIS — G20 Parkinson's disease: Secondary | ICD-10-CM | POA: Diagnosis not present

## 2018-12-20 DIAGNOSIS — E782 Mixed hyperlipidemia: Secondary | ICD-10-CM | POA: Diagnosis not present

## 2018-12-20 DIAGNOSIS — I10 Essential (primary) hypertension: Secondary | ICD-10-CM | POA: Diagnosis not present

## 2018-12-20 DIAGNOSIS — C50919 Malignant neoplasm of unspecified site of unspecified female breast: Secondary | ICD-10-CM | POA: Diagnosis not present

## 2018-12-20 DIAGNOSIS — D509 Iron deficiency anemia, unspecified: Secondary | ICD-10-CM | POA: Diagnosis not present

## 2018-12-20 DIAGNOSIS — M109 Gout, unspecified: Secondary | ICD-10-CM | POA: Diagnosis not present

## 2018-12-20 DIAGNOSIS — E1165 Type 2 diabetes mellitus with hyperglycemia: Secondary | ICD-10-CM | POA: Diagnosis not present

## 2019-01-19 DIAGNOSIS — G4733 Obstructive sleep apnea (adult) (pediatric): Secondary | ICD-10-CM | POA: Diagnosis not present

## 2019-01-31 DIAGNOSIS — C50919 Malignant neoplasm of unspecified site of unspecified female breast: Secondary | ICD-10-CM | POA: Diagnosis not present

## 2019-01-31 DIAGNOSIS — E1165 Type 2 diabetes mellitus with hyperglycemia: Secondary | ICD-10-CM | POA: Diagnosis not present

## 2019-01-31 DIAGNOSIS — K219 Gastro-esophageal reflux disease without esophagitis: Secondary | ICD-10-CM | POA: Diagnosis not present

## 2019-01-31 DIAGNOSIS — E039 Hypothyroidism, unspecified: Secondary | ICD-10-CM | POA: Diagnosis not present

## 2019-01-31 DIAGNOSIS — D509 Iron deficiency anemia, unspecified: Secondary | ICD-10-CM | POA: Diagnosis not present

## 2019-01-31 DIAGNOSIS — G250 Essential tremor: Secondary | ICD-10-CM | POA: Diagnosis not present

## 2019-01-31 DIAGNOSIS — G20 Parkinson's disease: Secondary | ICD-10-CM | POA: Diagnosis not present

## 2019-01-31 DIAGNOSIS — R251 Tremor, unspecified: Secondary | ICD-10-CM | POA: Diagnosis not present

## 2019-01-31 DIAGNOSIS — M109 Gout, unspecified: Secondary | ICD-10-CM | POA: Diagnosis not present

## 2019-01-31 DIAGNOSIS — E782 Mixed hyperlipidemia: Secondary | ICD-10-CM | POA: Diagnosis not present

## 2019-01-31 DIAGNOSIS — N10 Acute tubulo-interstitial nephritis: Secondary | ICD-10-CM | POA: Diagnosis not present

## 2019-01-31 DIAGNOSIS — I10 Essential (primary) hypertension: Secondary | ICD-10-CM | POA: Diagnosis not present

## 2019-03-13 DIAGNOSIS — D509 Iron deficiency anemia, unspecified: Secondary | ICD-10-CM | POA: Diagnosis not present

## 2019-03-13 DIAGNOSIS — R251 Tremor, unspecified: Secondary | ICD-10-CM | POA: Diagnosis not present

## 2019-03-13 DIAGNOSIS — C50919 Malignant neoplasm of unspecified site of unspecified female breast: Secondary | ICD-10-CM | POA: Diagnosis not present

## 2019-03-13 DIAGNOSIS — E1165 Type 2 diabetes mellitus with hyperglycemia: Secondary | ICD-10-CM | POA: Diagnosis not present

## 2019-03-13 DIAGNOSIS — G250 Essential tremor: Secondary | ICD-10-CM | POA: Diagnosis not present

## 2019-03-13 DIAGNOSIS — E782 Mixed hyperlipidemia: Secondary | ICD-10-CM | POA: Diagnosis not present

## 2019-03-13 DIAGNOSIS — N10 Acute tubulo-interstitial nephritis: Secondary | ICD-10-CM | POA: Diagnosis not present

## 2019-03-13 DIAGNOSIS — E039 Hypothyroidism, unspecified: Secondary | ICD-10-CM | POA: Diagnosis not present

## 2019-03-13 DIAGNOSIS — G20 Parkinson's disease: Secondary | ICD-10-CM | POA: Diagnosis not present

## 2019-03-13 DIAGNOSIS — I10 Essential (primary) hypertension: Secondary | ICD-10-CM | POA: Diagnosis not present

## 2019-03-13 DIAGNOSIS — M109 Gout, unspecified: Secondary | ICD-10-CM | POA: Diagnosis not present

## 2019-03-13 DIAGNOSIS — K219 Gastro-esophageal reflux disease without esophagitis: Secondary | ICD-10-CM | POA: Diagnosis not present

## 2019-05-10 DIAGNOSIS — K219 Gastro-esophageal reflux disease without esophagitis: Secondary | ICD-10-CM | POA: Diagnosis not present

## 2019-05-10 DIAGNOSIS — M109 Gout, unspecified: Secondary | ICD-10-CM | POA: Diagnosis not present

## 2019-05-10 DIAGNOSIS — M1000 Idiopathic gout, unspecified site: Secondary | ICD-10-CM | POA: Diagnosis not present

## 2019-05-10 DIAGNOSIS — I10 Essential (primary) hypertension: Secondary | ICD-10-CM | POA: Diagnosis not present

## 2019-05-10 DIAGNOSIS — G20 Parkinson's disease: Secondary | ICD-10-CM | POA: Diagnosis not present

## 2019-05-10 DIAGNOSIS — C50919 Malignant neoplasm of unspecified site of unspecified female breast: Secondary | ICD-10-CM | POA: Diagnosis not present

## 2019-05-10 DIAGNOSIS — E039 Hypothyroidism, unspecified: Secondary | ICD-10-CM | POA: Diagnosis not present

## 2019-05-10 DIAGNOSIS — E1165 Type 2 diabetes mellitus with hyperglycemia: Secondary | ICD-10-CM | POA: Diagnosis not present

## 2019-05-10 DIAGNOSIS — R251 Tremor, unspecified: Secondary | ICD-10-CM | POA: Diagnosis not present

## 2019-05-10 DIAGNOSIS — E782 Mixed hyperlipidemia: Secondary | ICD-10-CM | POA: Diagnosis not present

## 2019-05-10 DIAGNOSIS — G250 Essential tremor: Secondary | ICD-10-CM | POA: Diagnosis not present

## 2019-05-10 DIAGNOSIS — D509 Iron deficiency anemia, unspecified: Secondary | ICD-10-CM | POA: Diagnosis not present

## 2019-06-22 DIAGNOSIS — G4733 Obstructive sleep apnea (adult) (pediatric): Secondary | ICD-10-CM | POA: Diagnosis not present

## 2019-09-19 DIAGNOSIS — K219 Gastro-esophageal reflux disease without esophagitis: Secondary | ICD-10-CM | POA: Diagnosis not present

## 2019-09-19 DIAGNOSIS — E039 Hypothyroidism, unspecified: Secondary | ICD-10-CM | POA: Diagnosis not present

## 2019-09-19 DIAGNOSIS — M109 Gout, unspecified: Secondary | ICD-10-CM | POA: Diagnosis not present

## 2019-09-19 DIAGNOSIS — E1165 Type 2 diabetes mellitus with hyperglycemia: Secondary | ICD-10-CM | POA: Diagnosis not present

## 2019-09-19 DIAGNOSIS — D509 Iron deficiency anemia, unspecified: Secondary | ICD-10-CM | POA: Diagnosis not present

## 2019-09-19 DIAGNOSIS — E782 Mixed hyperlipidemia: Secondary | ICD-10-CM | POA: Diagnosis not present

## 2019-09-19 DIAGNOSIS — R251 Tremor, unspecified: Secondary | ICD-10-CM | POA: Diagnosis not present

## 2019-09-19 DIAGNOSIS — G20 Parkinson's disease: Secondary | ICD-10-CM | POA: Diagnosis not present

## 2019-09-19 DIAGNOSIS — M1000 Idiopathic gout, unspecified site: Secondary | ICD-10-CM | POA: Diagnosis not present

## 2019-09-19 DIAGNOSIS — I10 Essential (primary) hypertension: Secondary | ICD-10-CM | POA: Diagnosis not present

## 2019-09-19 DIAGNOSIS — C50919 Malignant neoplasm of unspecified site of unspecified female breast: Secondary | ICD-10-CM | POA: Diagnosis not present

## 2019-09-19 DIAGNOSIS — G250 Essential tremor: Secondary | ICD-10-CM | POA: Diagnosis not present

## 2019-12-06 DIAGNOSIS — D509 Iron deficiency anemia, unspecified: Secondary | ICD-10-CM | POA: Diagnosis not present

## 2019-12-06 DIAGNOSIS — E039 Hypothyroidism, unspecified: Secondary | ICD-10-CM | POA: Diagnosis not present

## 2019-12-06 DIAGNOSIS — R251 Tremor, unspecified: Secondary | ICD-10-CM | POA: Diagnosis not present

## 2019-12-06 DIAGNOSIS — E782 Mixed hyperlipidemia: Secondary | ICD-10-CM | POA: Diagnosis not present

## 2019-12-06 DIAGNOSIS — M1000 Idiopathic gout, unspecified site: Secondary | ICD-10-CM | POA: Diagnosis not present

## 2019-12-06 DIAGNOSIS — E1165 Type 2 diabetes mellitus with hyperglycemia: Secondary | ICD-10-CM | POA: Diagnosis not present

## 2019-12-06 DIAGNOSIS — G250 Essential tremor: Secondary | ICD-10-CM | POA: Diagnosis not present

## 2019-12-06 DIAGNOSIS — C50919 Malignant neoplasm of unspecified site of unspecified female breast: Secondary | ICD-10-CM | POA: Diagnosis not present

## 2019-12-06 DIAGNOSIS — M109 Gout, unspecified: Secondary | ICD-10-CM | POA: Diagnosis not present

## 2019-12-06 DIAGNOSIS — G20 Parkinson's disease: Secondary | ICD-10-CM | POA: Diagnosis not present

## 2019-12-06 DIAGNOSIS — K219 Gastro-esophageal reflux disease without esophagitis: Secondary | ICD-10-CM | POA: Diagnosis not present

## 2019-12-06 DIAGNOSIS — I10 Essential (primary) hypertension: Secondary | ICD-10-CM | POA: Diagnosis not present

## 2019-12-19 DIAGNOSIS — G4733 Obstructive sleep apnea (adult) (pediatric): Secondary | ICD-10-CM | POA: Diagnosis not present

## 2020-04-14 DIAGNOSIS — G4733 Obstructive sleep apnea (adult) (pediatric): Secondary | ICD-10-CM | POA: Diagnosis not present

## 2020-06-25 DIAGNOSIS — K219 Gastro-esophageal reflux disease without esophagitis: Secondary | ICD-10-CM | POA: Diagnosis not present

## 2020-06-25 DIAGNOSIS — N10 Acute tubulo-interstitial nephritis: Secondary | ICD-10-CM | POA: Diagnosis not present

## 2020-06-25 DIAGNOSIS — G4733 Obstructive sleep apnea (adult) (pediatric): Secondary | ICD-10-CM | POA: Diagnosis not present

## 2020-06-25 DIAGNOSIS — E1122 Type 2 diabetes mellitus with diabetic chronic kidney disease: Secondary | ICD-10-CM | POA: Diagnosis not present

## 2020-06-25 DIAGNOSIS — L720 Epidermal cyst: Secondary | ICD-10-CM | POA: Diagnosis not present

## 2020-06-25 DIAGNOSIS — G20 Parkinson's disease: Secondary | ICD-10-CM | POA: Diagnosis not present

## 2020-06-25 DIAGNOSIS — D72829 Elevated white blood cell count, unspecified: Secondary | ICD-10-CM | POA: Diagnosis not present

## 2020-06-25 DIAGNOSIS — E782 Mixed hyperlipidemia: Secondary | ICD-10-CM | POA: Diagnosis not present

## 2020-06-25 DIAGNOSIS — D509 Iron deficiency anemia, unspecified: Secondary | ICD-10-CM | POA: Diagnosis not present

## 2020-06-25 DIAGNOSIS — F319 Bipolar disorder, unspecified: Secondary | ICD-10-CM | POA: Diagnosis not present

## 2020-06-25 DIAGNOSIS — E039 Hypothyroidism, unspecified: Secondary | ICD-10-CM | POA: Diagnosis not present

## 2020-06-25 DIAGNOSIS — M1A00X0 Idiopathic chronic gout, unspecified site, without tophus (tophi): Secondary | ICD-10-CM | POA: Diagnosis not present

## 2020-06-25 DIAGNOSIS — M109 Gout, unspecified: Secondary | ICD-10-CM | POA: Diagnosis not present

## 2020-06-25 DIAGNOSIS — I10 Essential (primary) hypertension: Secondary | ICD-10-CM | POA: Diagnosis not present

## 2020-06-25 DIAGNOSIS — E1165 Type 2 diabetes mellitus with hyperglycemia: Secondary | ICD-10-CM | POA: Diagnosis not present

## 2020-06-25 DIAGNOSIS — R251 Tremor, unspecified: Secondary | ICD-10-CM | POA: Diagnosis not present

## 2020-06-25 DIAGNOSIS — C50919 Malignant neoplasm of unspecified site of unspecified female breast: Secondary | ICD-10-CM | POA: Diagnosis not present

## 2020-06-25 DIAGNOSIS — G250 Essential tremor: Secondary | ICD-10-CM | POA: Diagnosis not present

## 2020-06-26 ENCOUNTER — Other Ambulatory Visit (HOSPITAL_COMMUNITY): Payer: Self-pay | Admitting: Internal Medicine

## 2020-06-26 DIAGNOSIS — Z13820 Encounter for screening for osteoporosis: Secondary | ICD-10-CM

## 2020-09-03 DIAGNOSIS — G4733 Obstructive sleep apnea (adult) (pediatric): Secondary | ICD-10-CM | POA: Diagnosis not present

## 2021-01-13 DIAGNOSIS — D72829 Elevated white blood cell count, unspecified: Secondary | ICD-10-CM | POA: Diagnosis not present

## 2021-01-13 DIAGNOSIS — K219 Gastro-esophageal reflux disease without esophagitis: Secondary | ICD-10-CM | POA: Diagnosis not present

## 2021-01-13 DIAGNOSIS — Z853 Personal history of malignant neoplasm of breast: Secondary | ICD-10-CM | POA: Diagnosis not present

## 2021-01-13 DIAGNOSIS — E782 Mixed hyperlipidemia: Secondary | ICD-10-CM | POA: Diagnosis not present

## 2021-01-13 DIAGNOSIS — E039 Hypothyroidism, unspecified: Secondary | ICD-10-CM | POA: Diagnosis not present

## 2021-01-13 DIAGNOSIS — Z23 Encounter for immunization: Secondary | ICD-10-CM | POA: Diagnosis not present

## 2021-01-13 DIAGNOSIS — E1165 Type 2 diabetes mellitus with hyperglycemia: Secondary | ICD-10-CM | POA: Diagnosis not present

## 2021-01-13 DIAGNOSIS — F319 Bipolar disorder, unspecified: Secondary | ICD-10-CM | POA: Diagnosis not present

## 2021-01-13 DIAGNOSIS — N1831 Chronic kidney disease, stage 3a: Secondary | ICD-10-CM | POA: Diagnosis not present

## 2021-01-13 DIAGNOSIS — R251 Tremor, unspecified: Secondary | ICD-10-CM | POA: Diagnosis not present

## 2021-01-13 DIAGNOSIS — M109 Gout, unspecified: Secondary | ICD-10-CM | POA: Diagnosis not present

## 2021-01-13 DIAGNOSIS — G20 Parkinson's disease: Secondary | ICD-10-CM | POA: Diagnosis not present

## 2021-03-10 DIAGNOSIS — Z6835 Body mass index (BMI) 35.0-35.9, adult: Secondary | ICD-10-CM | POA: Diagnosis not present

## 2021-03-10 DIAGNOSIS — E1169 Type 2 diabetes mellitus with other specified complication: Secondary | ICD-10-CM | POA: Diagnosis not present

## 2021-03-10 DIAGNOSIS — F319 Bipolar disorder, unspecified: Secondary | ICD-10-CM | POA: Diagnosis not present

## 2021-03-10 DIAGNOSIS — J449 Chronic obstructive pulmonary disease, unspecified: Secondary | ICD-10-CM | POA: Diagnosis not present

## 2021-03-10 DIAGNOSIS — G20 Parkinson's disease: Secondary | ICD-10-CM | POA: Diagnosis not present

## 2021-03-10 DIAGNOSIS — E11649 Type 2 diabetes mellitus with hypoglycemia without coma: Secondary | ICD-10-CM | POA: Diagnosis not present

## 2021-03-10 DIAGNOSIS — E1165 Type 2 diabetes mellitus with hyperglycemia: Secondary | ICD-10-CM | POA: Diagnosis not present

## 2021-03-10 DIAGNOSIS — G4733 Obstructive sleep apnea (adult) (pediatric): Secondary | ICD-10-CM | POA: Diagnosis not present

## 2021-03-10 DIAGNOSIS — I10 Essential (primary) hypertension: Secondary | ICD-10-CM | POA: Diagnosis not present

## 2021-03-10 DIAGNOSIS — E1159 Type 2 diabetes mellitus with other circulatory complications: Secondary | ICD-10-CM | POA: Diagnosis not present

## 2021-03-10 DIAGNOSIS — Z794 Long term (current) use of insulin: Secondary | ICD-10-CM | POA: Diagnosis not present

## 2021-03-20 DIAGNOSIS — E039 Hypothyroidism, unspecified: Secondary | ICD-10-CM | POA: Diagnosis not present

## 2021-03-20 DIAGNOSIS — F319 Bipolar disorder, unspecified: Secondary | ICD-10-CM | POA: Diagnosis not present

## 2021-03-20 DIAGNOSIS — E785 Hyperlipidemia, unspecified: Secondary | ICD-10-CM | POA: Diagnosis not present

## 2021-03-20 DIAGNOSIS — K5900 Constipation, unspecified: Secondary | ICD-10-CM | POA: Diagnosis not present

## 2021-03-20 DIAGNOSIS — G20 Parkinson's disease: Secondary | ICD-10-CM | POA: Diagnosis not present

## 2021-03-20 DIAGNOSIS — G4733 Obstructive sleep apnea (adult) (pediatric): Secondary | ICD-10-CM | POA: Diagnosis not present

## 2021-03-20 DIAGNOSIS — F3341 Major depressive disorder, recurrent, in partial remission: Secondary | ICD-10-CM | POA: Diagnosis not present

## 2021-03-20 DIAGNOSIS — E1169 Type 2 diabetes mellitus with other specified complication: Secondary | ICD-10-CM | POA: Diagnosis not present

## 2021-03-20 DIAGNOSIS — Z794 Long term (current) use of insulin: Secondary | ICD-10-CM | POA: Diagnosis not present

## 2021-03-20 DIAGNOSIS — E1165 Type 2 diabetes mellitus with hyperglycemia: Secondary | ICD-10-CM | POA: Diagnosis not present

## 2021-03-20 DIAGNOSIS — G250 Essential tremor: Secondary | ICD-10-CM | POA: Diagnosis not present

## 2021-03-24 DIAGNOSIS — E11649 Type 2 diabetes mellitus with hypoglycemia without coma: Secondary | ICD-10-CM | POA: Diagnosis not present

## 2021-03-24 DIAGNOSIS — Z7984 Long term (current) use of oral hypoglycemic drugs: Secondary | ICD-10-CM | POA: Diagnosis not present

## 2021-03-27 DIAGNOSIS — Z515 Encounter for palliative care: Secondary | ICD-10-CM | POA: Diagnosis not present

## 2021-03-27 DIAGNOSIS — E1169 Type 2 diabetes mellitus with other specified complication: Secondary | ICD-10-CM | POA: Diagnosis not present

## 2021-03-27 DIAGNOSIS — Z794 Long term (current) use of insulin: Secondary | ICD-10-CM | POA: Diagnosis not present

## 2021-03-27 DIAGNOSIS — Z7985 Long-term (current) use of injectable non-insulin antidiabetic drugs: Secondary | ICD-10-CM | POA: Diagnosis not present

## 2021-03-27 DIAGNOSIS — Z7984 Long term (current) use of oral hypoglycemic drugs: Secondary | ICD-10-CM | POA: Diagnosis not present

## 2021-03-27 DIAGNOSIS — G20 Parkinson's disease: Secondary | ICD-10-CM | POA: Diagnosis not present

## 2021-03-27 DIAGNOSIS — Z6834 Body mass index (BMI) 34.0-34.9, adult: Secondary | ICD-10-CM | POA: Diagnosis not present

## 2021-03-27 DIAGNOSIS — I10 Essential (primary) hypertension: Secondary | ICD-10-CM | POA: Diagnosis not present

## 2021-03-27 DIAGNOSIS — E1159 Type 2 diabetes mellitus with other circulatory complications: Secondary | ICD-10-CM | POA: Diagnosis not present

## 2021-04-01 DIAGNOSIS — E162 Hypoglycemia, unspecified: Secondary | ICD-10-CM | POA: Diagnosis not present

## 2021-04-07 DIAGNOSIS — E119 Type 2 diabetes mellitus without complications: Secondary | ICD-10-CM | POA: Diagnosis not present

## 2021-04-07 DIAGNOSIS — Z7984 Long term (current) use of oral hypoglycemic drugs: Secondary | ICD-10-CM | POA: Diagnosis not present

## 2021-04-07 DIAGNOSIS — Z794 Long term (current) use of insulin: Secondary | ICD-10-CM | POA: Diagnosis not present

## 2021-04-15 DIAGNOSIS — E162 Hypoglycemia, unspecified: Secondary | ICD-10-CM | POA: Diagnosis not present

## 2021-06-07 ENCOUNTER — Encounter (HOSPITAL_COMMUNITY): Payer: Self-pay | Admitting: Emergency Medicine

## 2021-06-07 ENCOUNTER — Other Ambulatory Visit: Payer: Self-pay

## 2021-06-07 ENCOUNTER — Emergency Department (HOSPITAL_COMMUNITY): Payer: PPO

## 2021-06-07 ENCOUNTER — Inpatient Hospital Stay (HOSPITAL_COMMUNITY)
Admission: EM | Admit: 2021-06-07 | Discharge: 2021-06-15 | DRG: 064 | Disposition: A | Discharge status: Skilled Nursing Facility | Payer: PPO | Attending: Family Medicine | Admitting: Family Medicine

## 2021-06-07 DIAGNOSIS — G20 Parkinson's disease: Secondary | ICD-10-CM | POA: Diagnosis not present

## 2021-06-07 DIAGNOSIS — C7951 Secondary malignant neoplasm of bone: Secondary | ICD-10-CM | POA: Diagnosis present

## 2021-06-07 DIAGNOSIS — G8194 Hemiplegia, unspecified affecting left nondominant side: Secondary | ICD-10-CM | POA: Diagnosis present

## 2021-06-07 DIAGNOSIS — D72829 Elevated white blood cell count, unspecified: Secondary | ICD-10-CM | POA: Diagnosis not present

## 2021-06-07 DIAGNOSIS — Z66 Do not resuscitate: Secondary | ICD-10-CM | POA: Diagnosis not present

## 2021-06-07 DIAGNOSIS — Z9012 Acquired absence of left breast and nipple: Secondary | ICD-10-CM

## 2021-06-07 DIAGNOSIS — N183 Chronic kidney disease, stage 3 unspecified: Secondary | ICD-10-CM | POA: Diagnosis present

## 2021-06-07 DIAGNOSIS — N1832 Chronic kidney disease, stage 3b: Secondary | ICD-10-CM | POA: Diagnosis present

## 2021-06-07 DIAGNOSIS — G9341 Metabolic encephalopathy: Secondary | ICD-10-CM | POA: Diagnosis present

## 2021-06-07 DIAGNOSIS — G4730 Sleep apnea, unspecified: Secondary | ICD-10-CM | POA: Diagnosis present

## 2021-06-07 DIAGNOSIS — R55 Syncope and collapse: Secondary | ICD-10-CM | POA: Diagnosis not present

## 2021-06-07 DIAGNOSIS — I6350 Cerebral infarction due to unspecified occlusion or stenosis of unspecified cerebral artery: Principal | ICD-10-CM | POA: Diagnosis present

## 2021-06-07 DIAGNOSIS — E1165 Type 2 diabetes mellitus with hyperglycemia: Secondary | ICD-10-CM

## 2021-06-07 DIAGNOSIS — R410 Disorientation, unspecified: Secondary | ICD-10-CM | POA: Diagnosis not present

## 2021-06-07 DIAGNOSIS — I6521 Occlusion and stenosis of right carotid artery: Secondary | ICD-10-CM | POA: Diagnosis not present

## 2021-06-07 DIAGNOSIS — G219 Secondary parkinsonism, unspecified: Secondary | ICD-10-CM | POA: Diagnosis not present

## 2021-06-07 DIAGNOSIS — Z794 Long term (current) use of insulin: Secondary | ICD-10-CM

## 2021-06-07 DIAGNOSIS — R2689 Other abnormalities of gait and mobility: Secondary | ICD-10-CM | POA: Diagnosis not present

## 2021-06-07 DIAGNOSIS — I959 Hypotension, unspecified: Secondary | ICD-10-CM | POA: Diagnosis not present

## 2021-06-07 DIAGNOSIS — Z8673 Personal history of transient ischemic attack (TIA), and cerebral infarction without residual deficits: Secondary | ICD-10-CM | POA: Diagnosis not present

## 2021-06-07 DIAGNOSIS — Z888 Allergy status to other drugs, medicaments and biological substances status: Secondary | ICD-10-CM

## 2021-06-07 DIAGNOSIS — Z20822 Contact with and (suspected) exposure to covid-19: Secondary | ICD-10-CM | POA: Diagnosis present

## 2021-06-07 DIAGNOSIS — G319 Degenerative disease of nervous system, unspecified: Secondary | ICD-10-CM | POA: Diagnosis not present

## 2021-06-07 DIAGNOSIS — Z8709 Personal history of other diseases of the respiratory system: Secondary | ICD-10-CM

## 2021-06-07 DIAGNOSIS — J9610 Chronic respiratory failure, unspecified whether with hypoxia or hypercapnia: Secondary | ICD-10-CM | POA: Diagnosis present

## 2021-06-07 DIAGNOSIS — Z87891 Personal history of nicotine dependence: Secondary | ICD-10-CM | POA: Diagnosis not present

## 2021-06-07 DIAGNOSIS — E1122 Type 2 diabetes mellitus with diabetic chronic kidney disease: Secondary | ICD-10-CM | POA: Diagnosis present

## 2021-06-07 DIAGNOSIS — Z853 Personal history of malignant neoplasm of breast: Secondary | ICD-10-CM

## 2021-06-07 DIAGNOSIS — M50321 Other cervical disc degeneration at C4-C5 level: Secondary | ICD-10-CM | POA: Diagnosis not present

## 2021-06-07 DIAGNOSIS — C773 Secondary and unspecified malignant neoplasm of axilla and upper limb lymph nodes: Secondary | ICD-10-CM | POA: Diagnosis present

## 2021-06-07 DIAGNOSIS — E1110 Type 2 diabetes mellitus with ketoacidosis without coma: Secondary | ICD-10-CM | POA: Diagnosis present

## 2021-06-07 DIAGNOSIS — Z043 Encounter for examination and observation following other accident: Secondary | ICD-10-CM | POA: Diagnosis not present

## 2021-06-07 DIAGNOSIS — I10 Essential (primary) hypertension: Secondary | ICD-10-CM | POA: Diagnosis not present

## 2021-06-07 DIAGNOSIS — E1310 Other specified diabetes mellitus with ketoacidosis without coma: Secondary | ICD-10-CM | POA: Diagnosis not present

## 2021-06-07 DIAGNOSIS — M1990 Unspecified osteoarthritis, unspecified site: Secondary | ICD-10-CM | POA: Diagnosis not present

## 2021-06-07 DIAGNOSIS — A4189 Other specified sepsis: Secondary | ICD-10-CM | POA: Diagnosis not present

## 2021-06-07 DIAGNOSIS — K5900 Constipation, unspecified: Secondary | ICD-10-CM | POA: Diagnosis not present

## 2021-06-07 DIAGNOSIS — R29710 NIHSS score 10: Secondary | ICD-10-CM | POA: Diagnosis not present

## 2021-06-07 DIAGNOSIS — Z79890 Hormone replacement therapy (postmenopausal): Secondary | ICD-10-CM

## 2021-06-07 DIAGNOSIS — Z79899 Other long term (current) drug therapy: Secondary | ICD-10-CM

## 2021-06-07 DIAGNOSIS — N179 Acute kidney failure, unspecified: Secondary | ICD-10-CM | POA: Diagnosis present

## 2021-06-07 DIAGNOSIS — R414 Neurologic neglect syndrome: Secondary | ICD-10-CM | POA: Diagnosis not present

## 2021-06-07 DIAGNOSIS — D6869 Other thrombophilia: Secondary | ICD-10-CM | POA: Diagnosis present

## 2021-06-07 DIAGNOSIS — E860 Dehydration: Secondary | ICD-10-CM | POA: Diagnosis not present

## 2021-06-07 DIAGNOSIS — R41841 Cognitive communication deficit: Secondary | ICD-10-CM | POA: Diagnosis not present

## 2021-06-07 DIAGNOSIS — Z7401 Bed confinement status: Secondary | ICD-10-CM | POA: Diagnosis not present

## 2021-06-07 DIAGNOSIS — M47812 Spondylosis without myelopathy or radiculopathy, cervical region: Secondary | ICD-10-CM | POA: Diagnosis not present

## 2021-06-07 DIAGNOSIS — E039 Hypothyroidism, unspecified: Secondary | ICD-10-CM | POA: Diagnosis present

## 2021-06-07 DIAGNOSIS — I639 Cerebral infarction, unspecified: Secondary | ICD-10-CM | POA: Diagnosis not present

## 2021-06-07 DIAGNOSIS — R4182 Altered mental status, unspecified: Secondary | ICD-10-CM | POA: Diagnosis not present

## 2021-06-07 DIAGNOSIS — M50322 Other cervical disc degeneration at C5-C6 level: Secondary | ICD-10-CM | POA: Diagnosis not present

## 2021-06-07 DIAGNOSIS — R000 Tachycardia, unspecified: Secondary | ICD-10-CM | POA: Diagnosis not present

## 2021-06-07 DIAGNOSIS — Z7984 Long term (current) use of oral hypoglycemic drugs: Secondary | ICD-10-CM

## 2021-06-07 DIAGNOSIS — Z91040 Latex allergy status: Secondary | ICD-10-CM

## 2021-06-07 DIAGNOSIS — Z87442 Personal history of urinary calculi: Secondary | ICD-10-CM

## 2021-06-07 DIAGNOSIS — M6281 Muscle weakness (generalized): Secondary | ICD-10-CM | POA: Diagnosis not present

## 2021-06-07 DIAGNOSIS — R739 Hyperglycemia, unspecified: Secondary | ICD-10-CM | POA: Diagnosis not present

## 2021-06-07 DIAGNOSIS — R7881 Bacteremia: Secondary | ICD-10-CM | POA: Diagnosis present

## 2021-06-07 DIAGNOSIS — I513 Intracardiac thrombosis, not elsewhere classified: Secondary | ICD-10-CM | POA: Diagnosis not present

## 2021-06-07 DIAGNOSIS — Z882 Allergy status to sulfonamides status: Secondary | ICD-10-CM

## 2021-06-07 DIAGNOSIS — S0081XA Abrasion of other part of head, initial encounter: Secondary | ICD-10-CM | POA: Diagnosis not present

## 2021-06-07 DIAGNOSIS — E0810 Diabetes mellitus due to underlying condition with ketoacidosis without coma: Principal | ICD-10-CM

## 2021-06-07 DIAGNOSIS — Z881 Allergy status to other antibiotic agents status: Secondary | ICD-10-CM

## 2021-06-07 DIAGNOSIS — K219 Gastro-esophageal reflux disease without esophagitis: Secondary | ICD-10-CM | POA: Diagnosis not present

## 2021-06-07 DIAGNOSIS — J449 Chronic obstructive pulmonary disease, unspecified: Secondary | ICD-10-CM | POA: Diagnosis not present

## 2021-06-07 DIAGNOSIS — R0902 Hypoxemia: Secondary | ICD-10-CM | POA: Diagnosis not present

## 2021-06-07 DIAGNOSIS — I6389 Other cerebral infarction: Secondary | ICD-10-CM | POA: Diagnosis not present

## 2021-06-07 DIAGNOSIS — E119 Type 2 diabetes mellitus without complications: Secondary | ICD-10-CM | POA: Diagnosis not present

## 2021-06-07 DIAGNOSIS — Z9049 Acquired absence of other specified parts of digestive tract: Secondary | ICD-10-CM

## 2021-06-07 DIAGNOSIS — R531 Weakness: Secondary | ICD-10-CM | POA: Diagnosis not present

## 2021-06-07 DIAGNOSIS — Z8249 Family history of ischemic heart disease and other diseases of the circulatory system: Secondary | ICD-10-CM

## 2021-06-07 DIAGNOSIS — G2111 Neuroleptic induced parkinsonism: Secondary | ICD-10-CM | POA: Diagnosis not present

## 2021-06-07 DIAGNOSIS — Z7982 Long term (current) use of aspirin: Secondary | ICD-10-CM

## 2021-06-07 DIAGNOSIS — Z8042 Family history of malignant neoplasm of prostate: Secondary | ICD-10-CM

## 2021-06-07 DIAGNOSIS — Z818 Family history of other mental and behavioral disorders: Secondary | ICD-10-CM

## 2021-06-07 LAB — CBC
HCT: 33.3 % — ABNORMAL LOW (ref 36.0–46.0)
Hemoglobin: 10.3 g/dL — ABNORMAL LOW (ref 12.0–15.0)
MCH: 24.5 pg — ABNORMAL LOW (ref 26.0–34.0)
MCHC: 30.9 g/dL (ref 30.0–36.0)
MCV: 79.3 fL — ABNORMAL LOW (ref 80.0–100.0)
Platelets: 606 10*3/uL — ABNORMAL HIGH (ref 150–400)
RBC: 4.2 MIL/uL (ref 3.87–5.11)
RDW: 16.7 % — ABNORMAL HIGH (ref 11.5–15.5)
WBC: 23.3 10*3/uL — ABNORMAL HIGH (ref 4.0–10.5)
nRBC: 0 % (ref 0.0–0.2)

## 2021-06-07 LAB — CBG MONITORING, ED
Glucose-Capillary: >600 mg/dL (ref 70–99)
Glucose-Capillary: >600 mg/dL (ref 70–99)
Glucose-Capillary: 540 mg/dL (ref 70–99)
Glucose-Capillary: 392 mg/dL — ABNORMAL HIGH (ref 70–99)

## 2021-06-07 LAB — BASIC METABOLIC PANEL
Anion gap: 20 — ABNORMAL HIGH (ref 5–15)
Anion gap: 18 — ABNORMAL HIGH (ref 5–15)
BUN: 44 mg/dL — ABNORMAL HIGH (ref 8–23)
BUN: 42 mg/dL — ABNORMAL HIGH (ref 8–23)
CO2: 15 mmol/L — ABNORMAL LOW (ref 22–32)
CO2: 16 mmol/L — ABNORMAL LOW (ref 22–32)
Calcium: 8.8 mg/dL — ABNORMAL LOW (ref 8.9–10.3)
Calcium: 9.2 mg/dL (ref 8.9–10.3)
Chloride: 97 mmol/L — ABNORMAL LOW (ref 98–111)
Chloride: 103 mmol/L (ref 98–111)
Creatinine, Ser: 1.82 mg/dL — ABNORMAL HIGH (ref 0.44–1.00)
Creatinine, Ser: 1.64 mg/dL — ABNORMAL HIGH (ref 0.44–1.00)
GFR, Estimated: 29 mL/min — ABNORMAL LOW (ref >60)
GFR, Estimated: 33 mL/min — ABNORMAL LOW (ref >60)
Glucose, Bld: 785 mg/dL (ref 70–99)
Glucose, Bld: 563 mg/dL (ref 70–99)
Potassium: 4.8 mmol/L (ref 3.5–5.1)
Potassium: 4.6 mmol/L (ref 3.5–5.1)
Sodium: 132 mmol/L — ABNORMAL LOW (ref 135–145)
Sodium: 137 mmol/L (ref 135–145)

## 2021-06-07 LAB — URINALYSIS, ROUTINE W REFLEX MICROSCOPIC
Bilirubin Urine: NEGATIVE
Glucose, UA: >=500 mg/dL — AB
Ketones, ur: 20 mg/dL — AB
Leukocytes,Ua: NEGATIVE
Nitrite: NEGATIVE
Protein, ur: NEGATIVE mg/dL
Specific Gravity, Urine: 1.023 (ref 1.005–1.030)
pH: 5 (ref 5.0–8.0)

## 2021-06-07 LAB — RESP PANEL BY RT-PCR (FLU A&B, COVID) ARPGX2
Influenza A by PCR: NEGATIVE
Influenza B by PCR: NEGATIVE
SARS Coronavirus 2 by RT PCR: NEGATIVE

## 2021-06-07 LAB — CK: Total CK: 60 U/L (ref 38–234)

## 2021-06-07 LAB — BETA-HYDROXYBUTYRIC ACID: Beta-Hydroxybutyric Acid: 2.1 mmol/L — ABNORMAL HIGH (ref 0.05–0.27)

## 2021-06-07 IMAGING — CT CT HEAD W/O CM
5 of 9 series · 17 of 47 positions shown, 18 images · non-contrast
Comparison: [DATE]

CLINICAL DATA: Found down. History of Parkinson's. Fall. Abrasion
to the forehead.



[Series 4: head w o · axial · 0.47mm/px · z∈[+18,+178]mm · 3 of 33 slices shown, 4 images]
[im 1/33  brain]
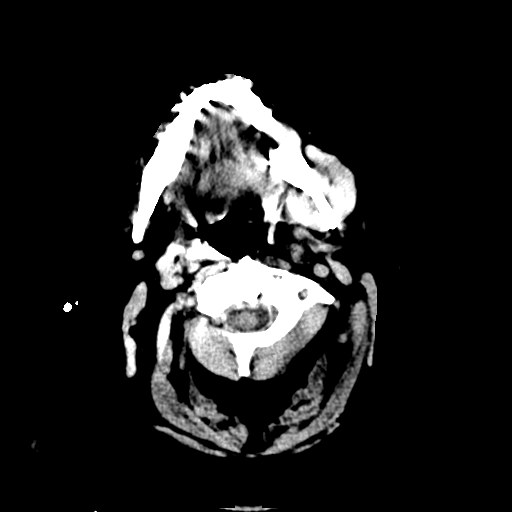
[im 1/33  bone]
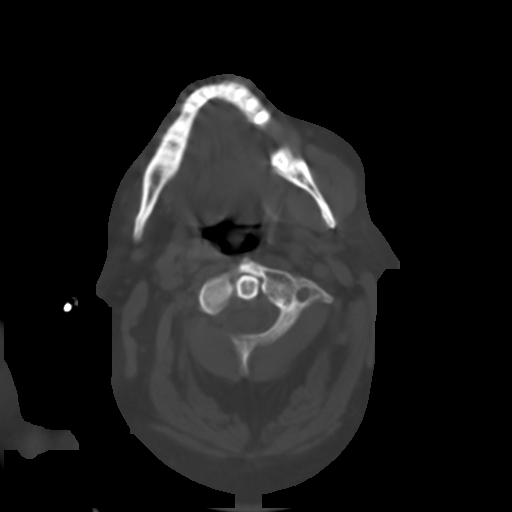
[im 17/33  brain]
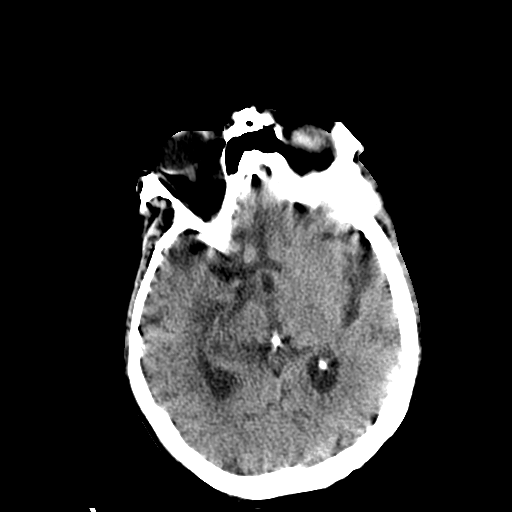
[im 33/33  brain]
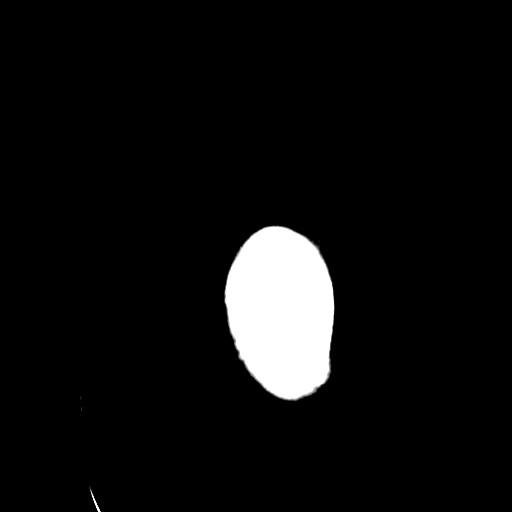

[Series 5: head bone · axial · 0.47mm/px · z∈[+40,+156]mm · 6 of 82 slices shown]
[im 12/82  bone]
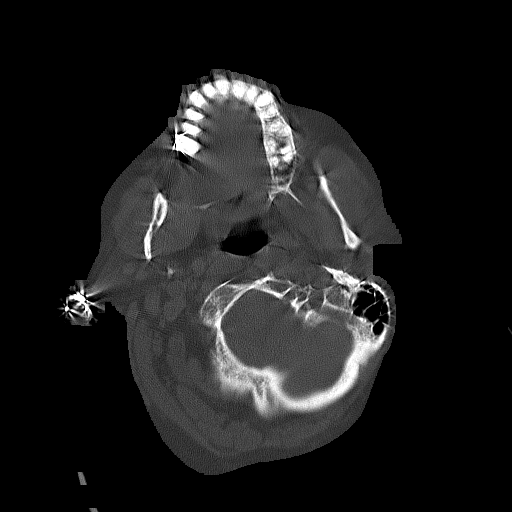
[im 24/82  bone]
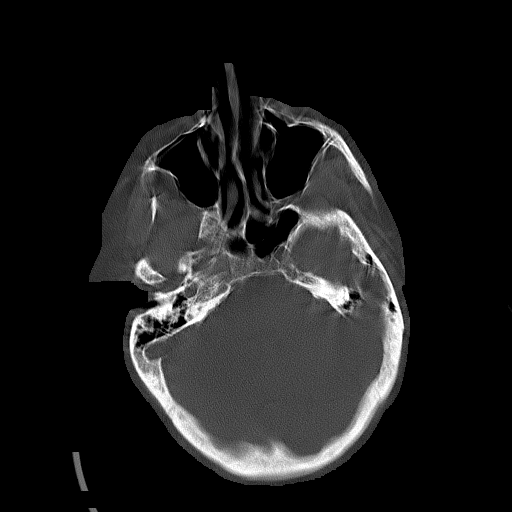
[im 35/82  bone]
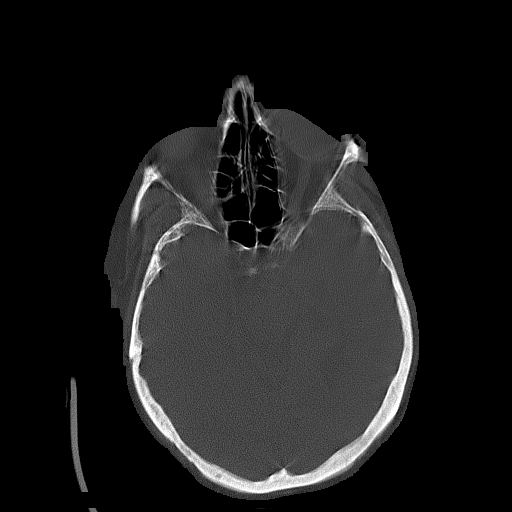
[im 47/82  bone]
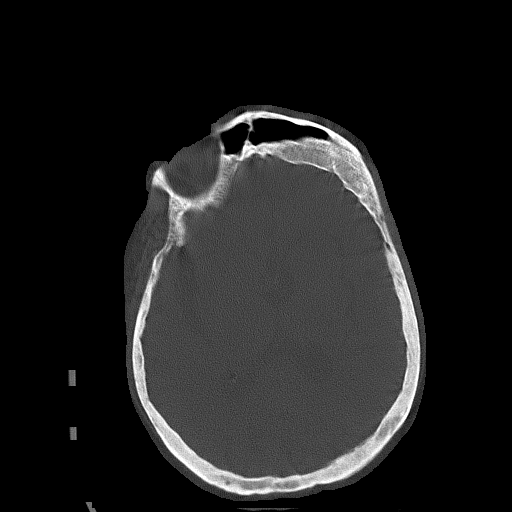
[im 58/82  bone]
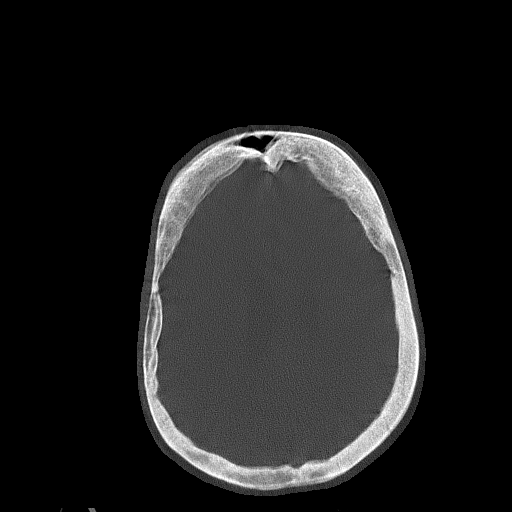
[im 70/82  bone]
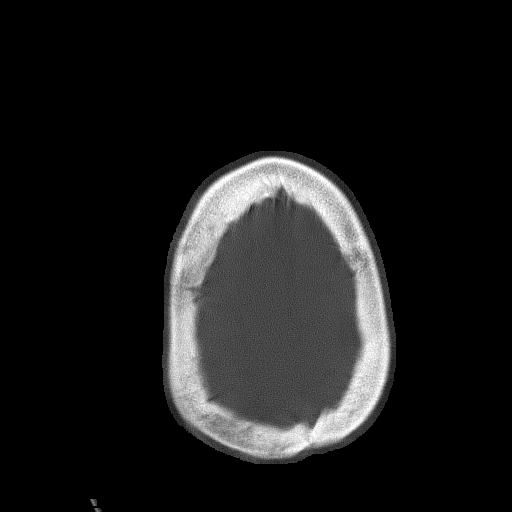

[Series 7: head ax bone · axial · 0.32mm/px · z∈[+43,+69]mm · 2 of 90 slices shown]
[im 13/90  bone]
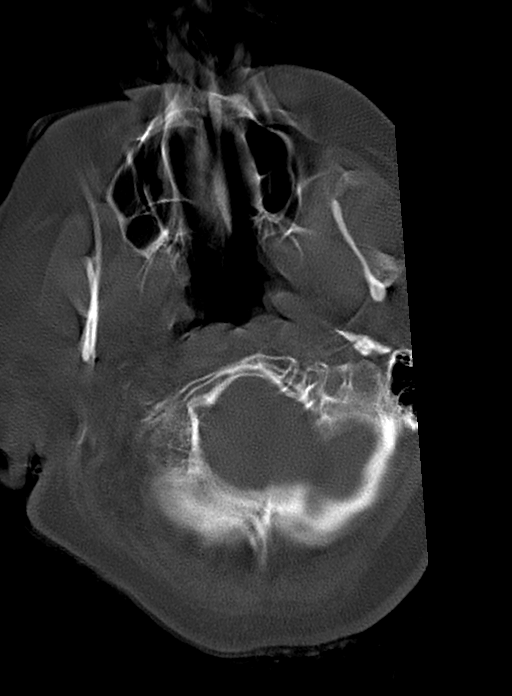
[im 26/90  bone]
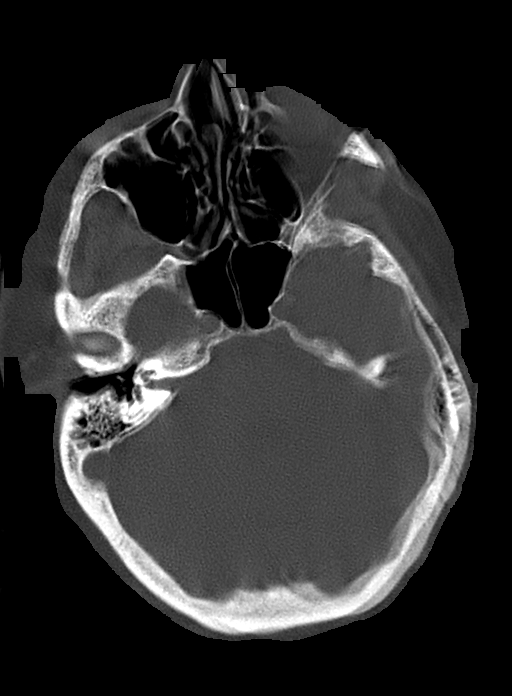

[Series 9: coronal soft · coronal · 0.34mm/px · 3 of 71 slices shown]
[im 18/71  brain]
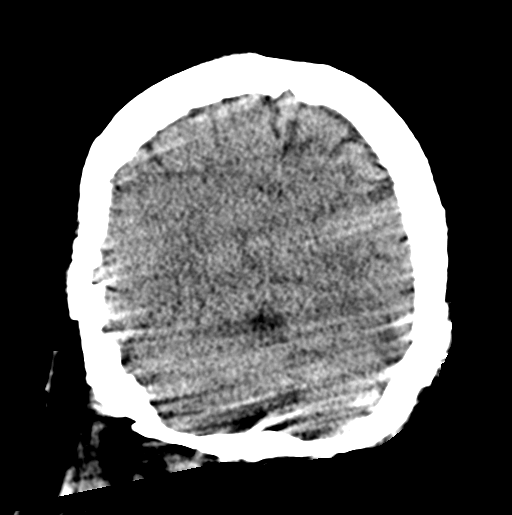
[im 36/71  brain]
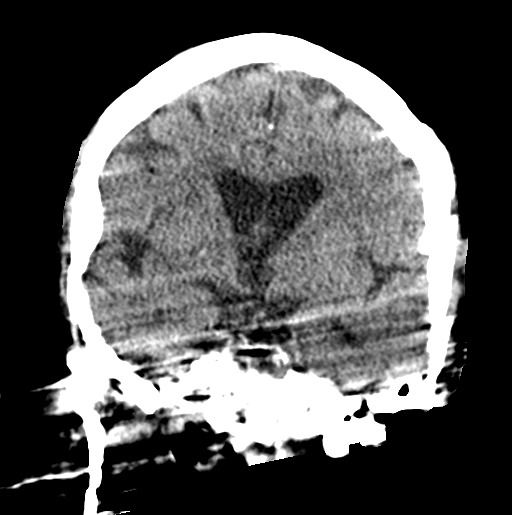
[im 53/71  brain]
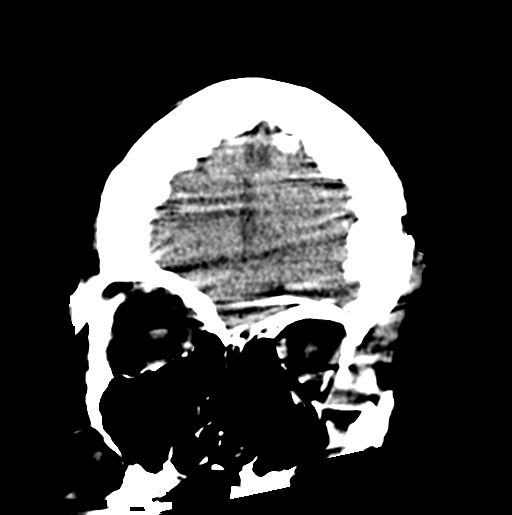

[Series 10: sagittal soft · sagittal · 0.34mm/px · 3 of 58 slices shown]
[im 19/58  brain]
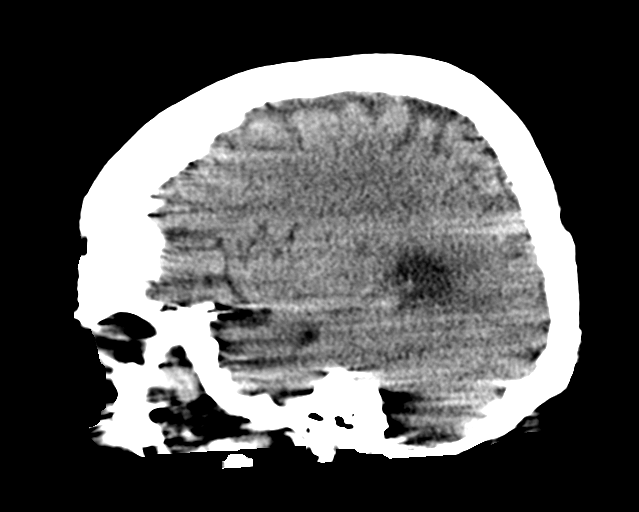
[im 31/58  brain]
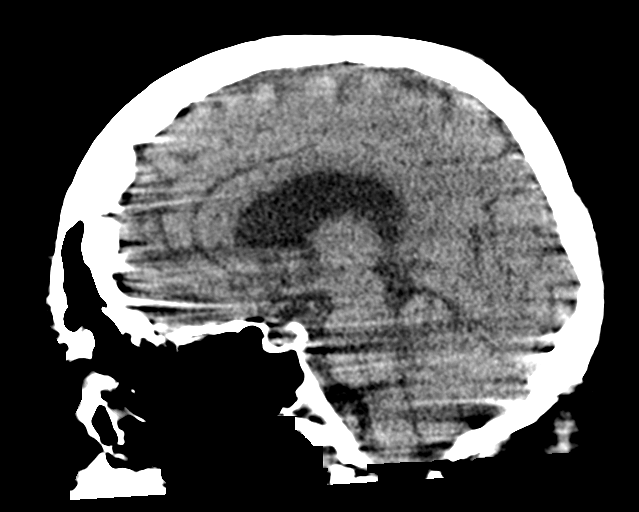
[im 43/58  brain]
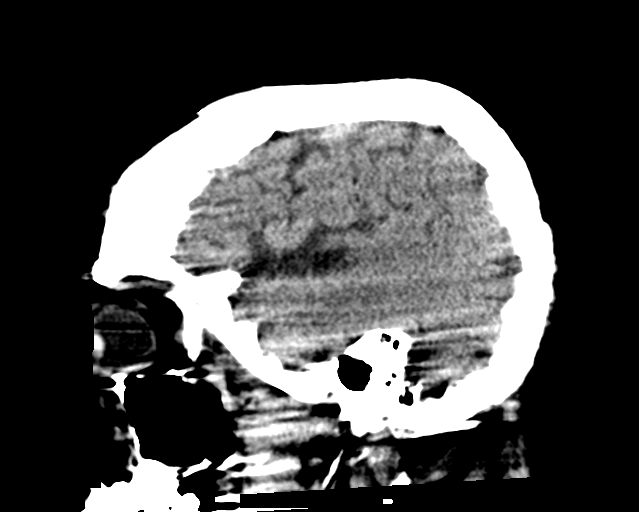

[17 of 47 positions shown; findings below may reference images not displayed]

FINDINGS: Brain: Motion artifact limits examination. No obvious mass effect or
midline shift. No focal lesions identified. Ventricular dilatation
is likely due to central atrophy. No obvious acute intracranial
hemorrhage.

Vascular: No hyperdense vessel or unexpected calcification.

Skull: Calvarium appears intact.

Sinuses/Orbits: Visualized paranasal sinuses and mastoid air cells
are clear.

Other: Examination is significantly limited due to motion artifact
despite attempts at repeat imaging.
IMPRESSION: Limited examination due to motion artifact. No acute intracranial
abnormalities are identified as visualized.

## 2021-06-07 IMAGING — DX DG CHEST 1V PORT
1 series · 2 of 2 positions shown · non-contrast
Comparison: Chest x-ray [DATE]

CLINICAL DATA: Fall.

EXAM:
PORTABLE CHEST 1 VIEW

[Series 1: chest ap · 0.14mm/px · 2 of 2 slices shown]
[im 1/2]
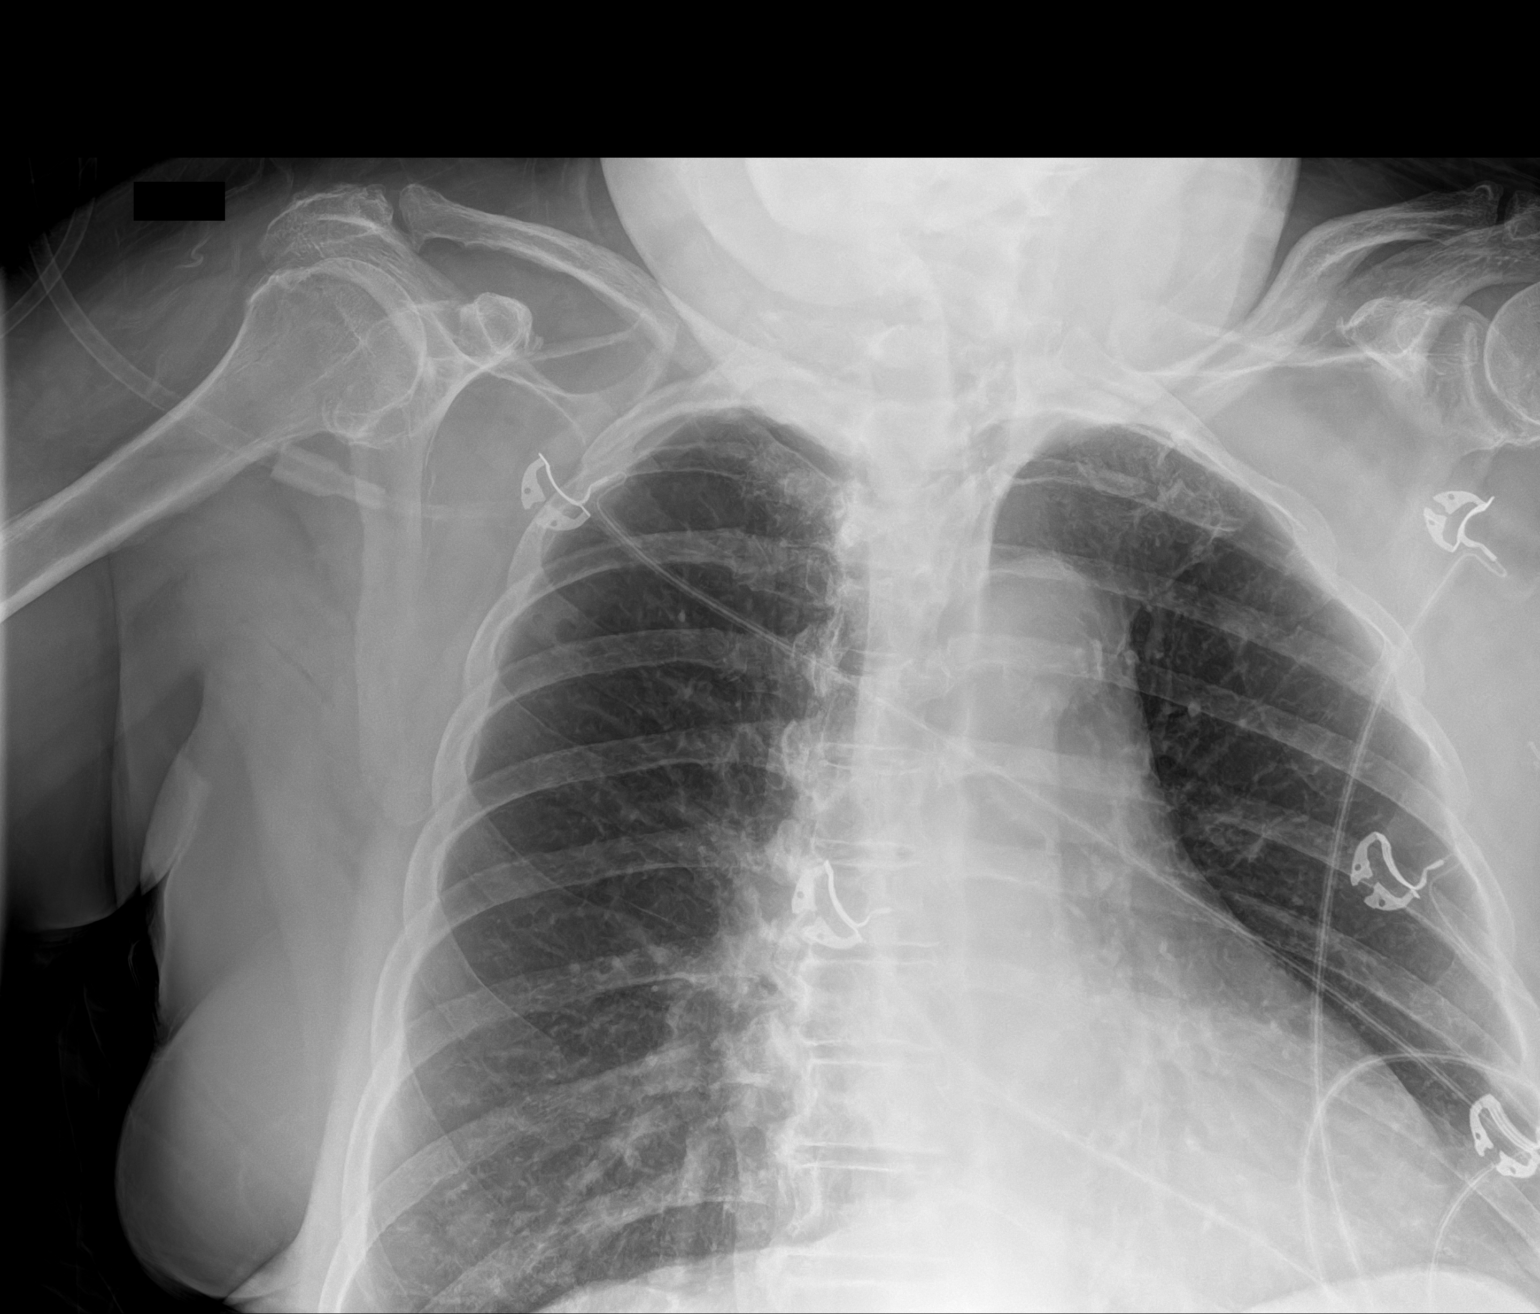
[im 2/2]
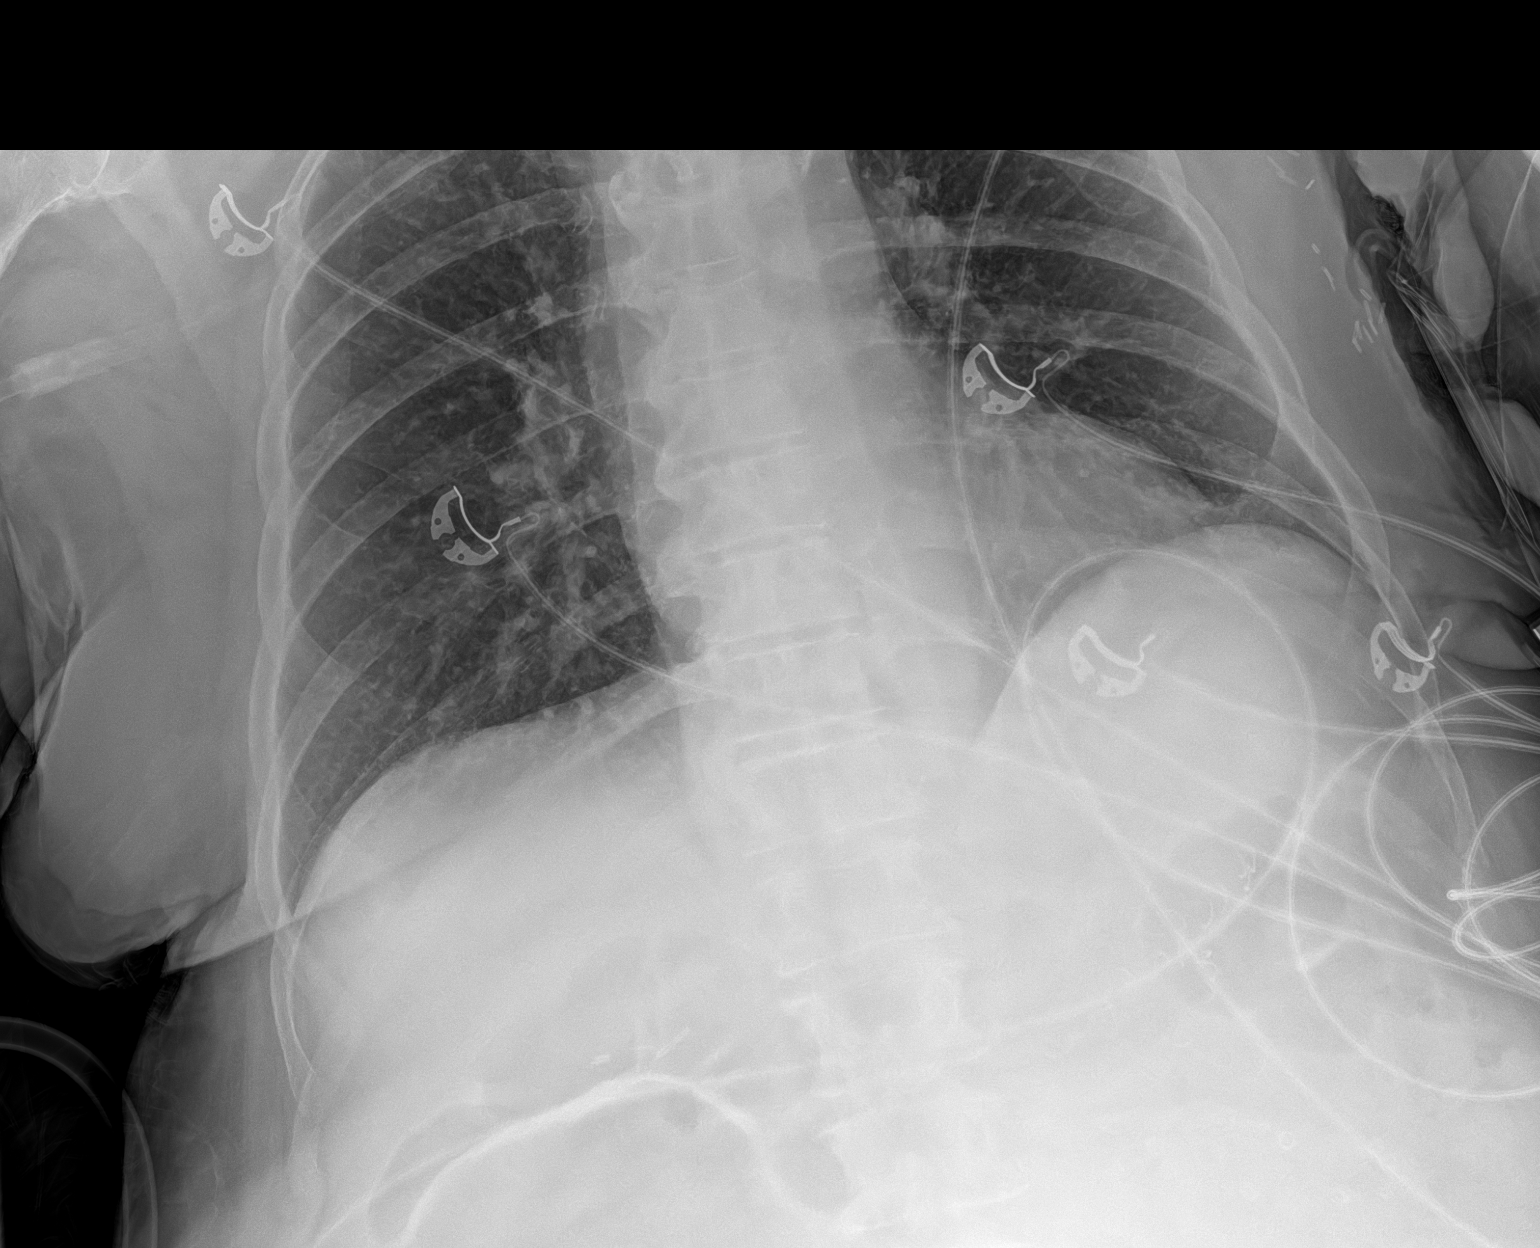

[2 of 2 positions shown; findings below may reference images not displayed]

FINDINGS: The heart size and mediastinal contours are within normal limits.
Both lungs are clear. The visualized skeletal structures are
unremarkable. There surgical clips in the upper abdomen and left
axilla.
IMPRESSION: No active disease.

## 2021-06-07 MED ORDER — LACTATED RINGERS IV SOLN: INTRAVENOUS | Status: DC

## 2021-06-07 MED ORDER — POTASSIUM CHLORIDE 10 MEQ/100ML IV SOLN
10.0000 meq | INTRAVENOUS | Status: AC
  Administered 2021-06-07 (×2): 10 meq via INTRAVENOUS
  Filled 2021-06-07 (×2): qty 100

## 2021-06-07 MED ORDER — LACTATED RINGERS IV BOLUS
1000.0000 mL | Freq: Once | INTRAVENOUS | Status: AC
  Administered 2021-06-08: 1000 mL via INTRAVENOUS

## 2021-06-07 MED ORDER — LACTATED RINGERS IV BOLUS
20.0000 mL/kg | Freq: Once | INTRAVENOUS | Status: AC
  Administered 2021-06-07: 1820 mL via INTRAVENOUS

## 2021-06-07 MED ORDER — LORAZEPAM 1 MG PO TABS
1.0000 mg | ORAL_TABLET | Freq: Once | ORAL | Status: DC
Start: 2021-06-07 — End: 2021-06-07

## 2021-06-07 MED ORDER — DEXTROSE 50 % IV SOLN: 0.0000 mL | INTRAVENOUS | Status: DC | PRN

## 2021-06-07 MED ORDER — LORAZEPAM 2 MG/ML IJ SOLN
1.0000 mg | Freq: Once | INTRAMUSCULAR | Status: AC
  Administered 2021-06-07: 1 mg via INTRAVENOUS
  Filled 2021-06-07: qty 1

## 2021-06-07 MED ORDER — INSULIN REGULAR(HUMAN) IN NACL 100-0.9 UT/100ML-% IV SOLN
INTRAVENOUS | Status: DC
  Administered 2021-06-07: 11.5 [IU]/h via INTRAVENOUS
  Filled 2021-06-07: qty 100

## 2021-06-07 MED ORDER — DEXTROSE IN LACTATED RINGERS 5 % IV SOLN: INTRAVENOUS | Status: DC

## 2021-06-07 NOTE — ED Notes (Signed)
Pt has attempted multiple times to try and get out of bed. Instructed pt to call for assistance.Will move pt to room with better visual access. ?

## 2021-06-07 NOTE — Assessment & Plan Note (Signed)
WBC 23.3, chronically elevated, last check on file 3 to 4 years ago, persistent leukocytosis ranging from 11-18..  Chest x-ray clear.  Afebrile.  At this time no focus of infection identified. ?-UA pending ?-Trend for now ?-May need to follow-up with hem/onc on discharge ?

## 2021-06-07 NOTE — ED Triage Notes (Signed)
EMS called out for fall. Per EMS, pt lives alone and was found in floor by son. No obvious injuries noted per EMS and no complaints of pain from fall. EMS checked CBG and meter read "high". EMS gave 500cc NS bolus. Ems also states pt is on O2 at 2l nasal cannulabut when they arrived, pt was not on O2 and sats were 82%. Pt placed on O2 at '@L'$  nasal cannula and sats up to 100%. ?

## 2021-06-07 NOTE — ED Notes (Signed)
Patient transported to CT 

## 2021-06-07 NOTE — ED Notes (Signed)
Family states pt was taken off all diabetic medications last week d/t sugars being in 40"s. ?

## 2021-06-07 NOTE — Assessment & Plan Note (Addendum)
Blood sugars 785.  With anion gap of 20, serum bicarb of 15.  Beta hydroxybutyrate acid significantly elevated at 2.1.  Patient was previously on Tresiba, also 70/30 is On medication list.  Daughter reports her insulins medications were gradually titrated down and discontinued after episodes of hypoglycemia.  She is currently on metformin.  ?- Per care everywhere last visit 04/2021, patient was to continue Xultophy 40 units(insulin degludec and liraglutide) and metformin ?-Cpeptide low normal at 1.0  ?-Pt was treated with IV insulin EndoTool protocol ?-After acidosis corrected patient was transitioned to subcutaneous insulin basal bolus SSI regimen ?-continue frequent CBG monitoring ?- semglee 45 units, plus novolog 12 units TID with meals if eats 50% or more of meal, SSI, CBG 5 times per day ?CBG (last 3)  ?Recent Labs  ?  06/09/21 ?7858 06/09/21 ?0723 06/09/21 ?1140  ?GLUCAP 151* 189* 261*  ? ?

## 2021-06-07 NOTE — Assessment & Plan Note (Signed)
Stable.  Resume home regimen 

## 2021-06-07 NOTE — ED Provider Notes (Signed)
?[place] ?Provider Note ? ? ?CSN: [id number] ?Arrival date & time: 06/07/21  1927 ? ?  ? ?History ? ?Chief Complaint  ?Patient presents with  ? Hyperglycemia  ? ? ?Margaret Mcdaniel is a 70 y.o. female. ? ?EMS called for fall, pt found on floor by family member. Patient denies loc or specific pain. Denies fainting or dizziness, indicates mechanical fall. EMS noted blood sugar read high. Pt indicates has not had her insulin today. Pt is very poor historian - level 5 caveat. No headache. No chest pain or discomfort. No new or worsening sob - uses home o2. No abd pain or nvd. No dysuria or polyuria. No fever or chills. Denies extremity pain or injury. Skin intact. No anticoag use.  ? ?The history is provided by the patient, the EMS personnel and medical records. The history is limited by the condition of the patient.  ?Hyperglycemia ?Associated symptoms: no abdominal pain, no chest pain, no dysuria, no fever, no shortness of breath and no vomiting   ? ?  ? ?Home Medications ?Prior to Admission medications   ?Medication Sig Start Date End Date Taking? Authorizing Provider  ?carbidopa-levodopa (SINEMET IR) 25-100 MG tablet Take 1 tablet by mouth 3 (three) times daily. 10/27/16  Yes Kathrynn Ducking, MD  ?chlorproMAZINE (THORAZINE) 50 MG tablet Take 100 mg by mouth at bedtime.    Yes [provider]  ?citalopram (CELEXA) 10 MG tablet Take 10 mg by mouth daily. 08/18/16  Yes [provider]  ?levothyroxine (SYNTHROID, LEVOTHROID) 50 MCG tablet Take 50 mcg by mouth daily before breakfast.   Yes [provider]  ?polyethylene glycol powder (GLYCOLAX/MIRALAX) powder Take 17 g by mouth daily.  04/03/16  Yes [provider]  ?simvastatin (ZOCOR) 40 MG tablet Take 40 mg by mouth at bedtime.   Yes [provider]  ?allopurinol (ZYLOPRIM) 100 MG tablet Take 100 mg by mouth daily.    [provider]  ?aspirin EC 81 MG tablet Take 81 mg by mouth daily.     [provider]  ?ferrous sulfate 325 (65 FE) MG tablet Take 325 mg by mouth daily with breakfast.    [provider]  ?insulin degludec (TRESIBA FLEXTOUCH) 100 UNIT/ML SOPN FlexTouch Pen Inject 50 Units into the skin at bedtime.     [provider]  ?mirtazapine (REMERON) 15 MG tablet Take 15 mg by mouth at bedtime.    [provider]  ?niacin 500 MG CR capsule Take 500 mg by mouth at bedtime.    [provider]  ?nitrofurantoin (MACRODANTIN) 100 MG capsule Take 100 mg by mouth daily.    [provider]  ?NOVOLOG MIX 70/30 FLEXPEN (70-30) 100 UNIT/ML FlexPen Inject 30 Units into the skin 2 (two) times daily with a meal.  06/08/16   [provider]  ?omeprazole (PRILOSEC) 40 MG capsule Take 40 mg by mouth daily.    [provider]  ?temazepam (RESTORIL) 15 MG capsule Take 15 mg by mouth at bedtime.    [provider]  ?tiZANidine (ZANAFLEX) 4 MG tablet Take 8 mg by mouth 2 (two) times daily.    [provider]  ?traMADol (ULTRAM) 50 MG tablet Take 1 tablet (50 mg total) by mouth every 12 (twelve) hours as needed. 12/19/17   Aviva Signs, MD  ?   ? ?Allergies    ?Keflex [cephalexin], Cogentin [benztropine], Sulfa antibiotics, and Latex   ? ?Review of Systems   ?Review of Systems  ?  Constitutional:  Negative for fever.  ?HENT:  Negative for sore throat.   ?Eyes:  Negative for visual disturbance.  ?Respiratory:  Negative for shortness of breath.   ?Cardiovascular:  Negative for chest pain.  ?Gastrointestinal:  Negative for abdominal pain and vomiting.  ?Genitourinary:  Negative for dysuria and flank pain.  ?Musculoskeletal:  Negative for back pain and neck pain.  ?Skin:  Negative for rash.  ?Neurological:  Negative for headaches.  ?Hematological:  Does not bruise/bleed easily.  ?Psychiatric/Behavioral:  Negative for agitation.   ? ?Physical Exam ?Updated Vital Signs ?BP (!) 159/89 (BP Location: Left Arm)   Pulse (!) 116   Temp  97.7 ?F (36.5 ?C) (Oral)   Resp (!) 32   Ht 1.626 m ('5\' 4"'$ )   Wt 91 kg   SpO2 100%   BMI 34.44 kg/m?  ?Physical Exam ?Vitals and nursing note reviewed.  ?Constitutional:   ?   Appearance: Normal appearance. She is well-developed.  ?HENT:  ?   Head: Atraumatic.  ?   Nose: Nose normal.  ?   Mouth/Throat:  ?   Mouth: Mucous membranes are moist.  ?Eyes:  ?   General: No scleral icterus. ?   Conjunctiva/sclera: Conjunctivae normal.  ?   Pupils: Pupils are equal, round, and reactive to light.  ?Neck:  ?   Trachea: No tracheal deviation.  ?   Comments: No stiffness or rigidity. No bruits.  ?Cardiovascular:  ?   Rate and Rhythm: Normal rate and regular rhythm.  ?   Pulses: Normal pulses.  ?   Heart sounds: Normal heart sounds. No murmur heard. ?  No friction rub. No gallop.  ?Pulmonary:  ?   Effort: Pulmonary effort is normal. No respiratory distress.  ?   Breath sounds: Normal breath sounds.  ?Abdominal:  ?   General: Bowel sounds are normal. There is no distension.  ?   Palpations: Abdomen is soft.  ?   Tenderness: There is no abdominal tenderness. There is no guarding.  ?Genitourinary: ?   Comments: No cva tenderness.  ?Musculoskeletal:     ?   General: No swelling.  ?   Cervical back: Normal range of motion and neck supple. No rigidity. No muscular tenderness.  ?   Comments: CTLS spine, non tender, aligned, no step off. ?Good rom bil extremities without pain or focal bony tenderness.   ?Skin: ?   General: Skin is warm and dry.  ?   Findings: No rash.  ?Neurological:  ?   Mental Status: She is alert.  ?   Comments: Alert, speech normal. GCS 15. Motor/sens grossly intact bil.   ?Psychiatric:     ?   Mood and Affect: Mood normal.  ? ? ?ED Results / Procedures / Treatments   ?Labs ?(all labs ordered are listed, but only abnormal results are displayed) ?Results for orders placed or performed during the hospital encounter of 06/07/21  ?Resp Panel by RT-PCR (Flu A&B, Covid) Nasopharyngeal Swab  ? Specimen: Nasopharyngeal  Swab; Nasopharyngeal(NP) swabs in vial transport medium  ?Result Value Ref Range  ? SARS Coronavirus 2 by RT PCR NEGATIVE NEGATIVE  ? Influenza A by PCR NEGATIVE NEGATIVE  ? Influenza B by PCR NEGATIVE NEGATIVE  ?Basic metabolic panel  ?Result Value Ref Range  ? Sodium 132 (L) 135 - 145 mmol/L  ? Potassium 4.8 3.5 - 5.1 mmol/L  ? Chloride 97 (L) 98 - 111 mmol/L  ? CO2 15 (L) 22 - 32 mmol/L  ? Glucose,  Bld 785 (HH) 70 - 99 mg/dL  ? BUN 44 (H) 8 - 23 mg/dL  ? Creatinine, Ser 1.82 (H) 0.44 - 1.00 mg/dL  ? Calcium 8.8 (L) 8.9 - 10.3 mg/dL  ? GFR, Estimated 29 (L) >60 mL/min  ? Anion gap 20 (H) 5 - 15  ?CBC  ?Result Value Ref Range  ? WBC 23.3 (H) 4.0 - 10.5 K/uL  ? RBC 4.20 3.87 - 5.11 MIL/uL  ? Hemoglobin 10.3 (L) 12.0 - 15.0 g/dL  ? HCT 33.3 (L) 36.0 - 46.0 %  ? MCV 79.3 (L) 80.0 - 100.0 fL  ? MCH 24.5 (L) 26.0 - 34.0 pg  ? MCHC 30.9 30.0 - 36.0 g/dL  ? RDW 16.7 (H) 11.5 - 15.5 %  ? Platelets 606 (H) 150 - 400 K/uL  ? nRBC 0.0 0.0 - 0.2 %  ?CK  ?Result Value Ref Range  ? Total CK 60 38 - 234 U/L  ?Beta-hydroxybutyric acid  ?Result Value Ref Range  ? Beta-Hydroxybutyric Acid 2.10 (H) 0.05 - 0.27 mmol/L  ?CBG monitoring, ED  ?Result Value Ref Range  ? Glucose-Capillary >600 (HH) 70 - 99 mg/dL  ?CBG monitoring, ED  ?Result Value Ref Range  ? Glucose-Capillary >600 (HH) 70 - 99 mg/dL  ? ? ? ?EKG ?None ? ?Radiology ?DG Chest Port 1 View ? ?Result Date: 06/07/2021 ?CLINICAL DATA:  Fall. EXAM: PORTABLE CHEST 1 VIEW COMPARISON:  Chest x-ray 08/24/2016 FINDINGS: The heart size and mediastinal contours are within normal limits. Both lungs are clear. The visualized skeletal structures are unremarkable. There surgical clips in the upper abdomen and left axilla. IMPRESSION: No active disease. Electronically Signed   By: Ronney Asters M.D.   On: 06/07/2021 20:35   ? ?Procedures ?Procedures  ? ? ?Medications Ordered in ED ?Medications - No data to display ? ?ED Course/ Medical Decision Making/ A&P ?  ?                        ?Medical  Decision Making ?Problems Addressed: ?AKI (acute kidney injury) [facility]): acute illness or injury with systemic symptoms that poses a threat to life or bodily functions ?Diabetic ketoacidosis without coma associated with

## 2021-06-07 NOTE — Assessment & Plan Note (Addendum)
Acute metabolic encephalopathy with baseline Parkinson's disease.  Patient very confused, attempting to climb out of bed, staring off to the right side, appears to have left-sided neglect.  Moving all extremities unable to determine degree of strength or weakness if present.  Son talked to patient yesterday 4/1 at 37 in the morning.  Son found patient on the floor today. ?-Secondary to acute CVA ?- Slowly coming back around to baseline per family  ?- CVA treatment per neurology recommendations ?

## 2021-06-07 NOTE — H&P (Addendum)
?History and Physical  ? ? ?Margaret Mcdaniel NGE:[id number] DOB: 1950-03-18 DOA: 06/07/2021 ? ?PCP: [name], MD  ? ?Patient coming from: Home ? ?I have personally briefly reviewed patient's old medical records in [facility] ? ?Chief Complaint: AMS, high blood sugars ? ?HPI: Margaret Mcdaniel is a 71 y.o. female with medical history significant for diabetes mellitus, Parkinson's disease, COPD respiratory failure on 2 L, CKD 3, breast cancer. ? ?Patient was brought to the ED from home, reports of altered mental status and high blood sugar.  Patient's son found patient on the floor.  Patient's son talked to patient yesterday at about 11 in the morning and this was the last time patient was here was seen normal.  Today family called patient, patient did not appear to be normal on the phone, and her breathing was labored.  They went over to check on patient and saw patient on the floor, she got up on her knees but could not stand up.   ?EMS was called, blood sugar read high.  Patient was on room air, her O2 sats were 82%. ?At the time of my evaluation, patient is altered, unable to provide any history.  Daughter and son at bedside, they report that in January patient blood sugars were low in the 40s, medications were gradually reduced and stopped.  She was on [place], Ozempic.  She is currently still on metformin. ? ?ED Course: Temperature 97.7.  Tachycardic heart rate 190 117.  Respirate rate 18-32.  Blood pressure systolic 401U to 272Z.  O2 Sats 100% on 2 L. ?Blood sugar 785, anion gap of 20, serum bicarb of 15.  Leukocytosis of 23.3.  Beta Hydroxybutyric acid 2.1. ?1.8 L bolus given, hospitalist to admit for DKA. ? ?Review of Systems: Unable to ascertain due to patient's altered mental status. ? ?Past Medical History:  ?Diagnosis Date  ? Anxiety   ? Arthritis   ? Breast cancer metastasized to axillary lymph node, left ([place]) 04/14/2016  ? Cancer [facility])   ? left breast  ? CKD (chronic kidney disease) stage 3, GFR 30-59  ml/min ([facility]) 06/08/2016  ? COPD (chronic obstructive pulmonary disease) ([place])   ? Diabetes mellitus without complication ([place])   ? GERD (gastroesophageal reflux disease)   ? History of kidney stones   ? Hypothyroidism   ? Mental disorder   ? Neuromuscular disorder ([place])   ? PONV (postoperative nausea and vomiting)   ? RLS (restless legs syndrome) 06/29/2016  ? Secondary Parkinson disease ([place]) 06/29/2016  ? Sleep apnea   ? Uses CPAP  ? Uncontrolled type 2 diabetes mellitus with hyperglycemia, with long-term current use of insulin ([place]) 06/08/2016  ? ? ?Past Surgical History:  ?Procedure Laterality Date  ? ANKLE SURGERY Right   ? fusion  ? APPENDECTOMY    ? CHOLECYSTECTOMY    ? EXPLORATORY LAPAROTOMY    ? HEMORRHOID SURGERY    ? MASTECTOMY MODIFIED RADICAL Left 04/14/2016  ? Procedure: LEFT MODIFIED RADICAL MASTECTOMY;  Surgeon: Aviva Signs, MD;  Location: [facility];  Service: General;  Laterality: Left;  ? PORT-A-CATH REMOVAL Right 12/19/2017  ? Procedure: MINOR REMOVAL PORT-A-CATH;  Surgeon: Aviva Signs, MD;  Location: [facility];  Service: General;  Laterality: Right;  ? PORTACATH PLACEMENT N/A 05/26/2016  ? Procedure: INSERTION PORT-A-CATH RIGHT SUBCLAVIAN AND  DRAINAGE OF SEROMA LEFT CHEST;  Surgeon: Aviva Signs, MD;  Location: [facility];  Service: General;  Laterality: N/A;  ? ROTATOR CUFF REPAIR    ?  TONSILLECTOMY    ? TUBAL LIGATION    ? ? ? reports that she quit smoking about 7 years ago. Her smoking use included cigarettes. She has a 50.00 pack-year smoking history. She has never used smokeless tobacco. She reports that she does not drink alcohol and does not use drugs. ? ?Allergies  ?Allergen Reactions  ? Keflex [Cephalexin] Anaphylaxis  ?  Pt "codes" on this medication.  ? Cogentin [Benztropine] Other (See Comments)  ?  confusion  ? Sulfa Antibiotics Hives  ? Latex Rash  ? ? ?Family History  ?Problem Relation Age of Onset  ? Heart disease Mother   ? Bipolar disorder Father   ? Heart disease Father   ? Prostate  cancer Brother   ? ? ?Prior to Admission medications   ?Medication Sig Start Date End Date Taking? Authorizing Provider  ?carbidopa-levodopa (SINEMET IR) 25-100 MG tablet Take 1 tablet by mouth 3 (three) times daily. 10/27/16  Yes Kathrynn Ducking, MD  ?chlorproMAZINE (THORAZINE) 50 MG tablet Take 100 mg by mouth at bedtime.    Yes [provider]  ?citalopram (CELEXA) 10 MG tablet Take 10 mg by mouth daily. 08/18/16  Yes [provider]  ?GVOKE PFS 1 MG/0.2ML SOSY Inject 1 mg into the skin See admin instructions. Inject if Blood sugar <50 03/30/21  Yes [provider]  ?levothyroxine (SYNTHROID, LEVOTHROID) 50 MCG tablet Take 50 mcg by mouth daily before breakfast.   Yes [provider]  ?metFORMIN (GLUCOPHAGE-XR) 500 MG 24 hr tablet Take 1,000 mg by mouth daily. 03/09/21  Yes [provider]  ?polyethylene glycol powder (GLYCOLAX/MIRALAX) powder Take 17 g by mouth daily.  04/03/16  Yes [provider]  ?propranolol (INDERAL) 20 MG tablet Take 20 mg by mouth 3 (three) times daily. 04/30/21  Yes [provider]  ?simvastatin (ZOCOR) 40 MG tablet Take 40 mg by mouth at bedtime.   Yes [provider]  ?allopurinol (ZYLOPRIM) 100 MG tablet Take 100 mg by mouth daily. ?Patient not taking: Reported on 06/07/2021    [provider]  ?aspirin EC 81 MG tablet Take 81 mg by mouth daily. ?Patient not taking: Reported on 06/07/2021    [provider]  ?ferrous sulfate 325 (65 FE) MG tablet Take 325 mg by mouth daily with breakfast. ?Patient not taking: Reported on 06/07/2021    [provider]  ?insulin degludec (TRESIBA) 100 UNIT/ML FlexTouch Pen Inject 50 Units into the skin at bedtime.  ?Patient not taking: Reported on 06/07/2021    [provider]  ?mirtazapine (REMERON) 15 MG tablet Take 15 mg by mouth at bedtime. ?Patient not taking: Reported on 06/07/2021    [provider]  ?niacin 500 MG CR capsule Take 500 mg by mouth  at bedtime. ?Patient not taking: Reported on 06/07/2021    [provider]  ?nitrofurantoin (MACRODANTIN) 100 MG capsule Take 100 mg by mouth daily. ?Patient not taking: Reported on 06/07/2021    [provider]  ?NOVOLOG MIX 70/30 FLEXPEN (70-30) 100 UNIT/ML FlexPen Inject 30 Units into the skin 2 (two) times daily with a meal.  ?Patient not taking: Reported on 06/07/2021 06/08/16   [provider]  ?omeprazole (PRILOSEC) 40 MG capsule Take 40 mg by mouth daily. ?Patient not taking: Reported on 06/07/2021    [provider]  ?temazepam (RESTORIL) 15 MG capsule Take 15 mg by mouth at bedtime. ?Patient not taking: Reported on 06/07/2021    [provider]  ?tiZANidine (ZANAFLEX) 4  MG tablet Take 8 mg by mouth 2 (two) times daily. ?Patient not taking: Reported on 06/07/2021    [provider]  ?traMADol (ULTRAM) 50 MG tablet Take 1 tablet (50 mg total) by mouth every 12 (twelve) hours as needed. ?Patient not taking: Reported on 06/07/2021 12/19/17   Aviva Signs, MD  ? ? ?Physical Exam: ?Vitals:  ? 06/07/21 1940 06/07/21 1942 06/07/21 2045 06/07/21 2115  ?BP: (!) 159/89  (!) 160/97 123/68  ?Pulse: (!) 116  (!) 109 (!) 112  ?Resp: (!) 32  19 18  ?Temp: 97.7 ?F (36.5 ?C)     ?TempSrc: Oral     ?SpO2: 100%  100% 100%  ?Weight:  91 kg    ?Height:  '5\' 4"'$  (1.626 m)    ? ? ?Constitutional: Patient is very confused, altered, restless agitated staring off to the right side ?Vitals:  ? 06/07/21 1940 06/07/21 1942 06/07/21 2045 06/07/21 2115  ?BP: (!) 159/89  (!) 160/97 123/68  ?Pulse: (!) 116  (!) 109 (!) 112  ?Resp: (!) 32  19 18  ?Temp: 97.7 ?F (36.5 ?C)     ?TempSrc: Oral     ?SpO2: 100%  100% 100%  ?Weight:  91 kg    ?Height:  '5\' 4"'$  (1.626 m)    ? ?Eyes: Unable to examine eyes, patient closing them tightly shut ?ENMT: Mucous membranes are dry  ?Neck: normal, supple, no masses, no thyromegaly ?Respiratory: clear to auscultation bilaterally, no wheezing, no crackles. Normal respiratory  effort. No accessory muscle use.  ?Cardiovascular: Tachycardic, regular rate and rhythm, no murmurs / rubs / gallops. No extremity edema.  Lower extremities warm ?Abdomen: no tenderness, no masses palpate

## 2021-06-07 NOTE — Assessment & Plan Note (Addendum)
AKI on CKD 3A-B.  Creatinine 1.8, baseline to 1.3. ?- we held CTA study to give time for AKI to improve prior to giving IV contrast dye ?- if neurology still feels CTA is still needed probably could do on 4/5. Otherwise could try to repeat MRA (1st one was poor quality study due to patient movement) ?

## 2021-06-08 ENCOUNTER — Other Ambulatory Visit (HOSPITAL_COMMUNITY): Payer: PPO

## 2021-06-08 ENCOUNTER — Inpatient Hospital Stay (HOSPITAL_COMMUNITY): Payer: PPO

## 2021-06-08 ENCOUNTER — Other Ambulatory Visit (HOSPITAL_COMMUNITY): Payer: Self-pay | Admitting: *Deleted

## 2021-06-08 ENCOUNTER — Encounter (HOSPITAL_COMMUNITY): Payer: Self-pay | Admitting: Internal Medicine

## 2021-06-08 DIAGNOSIS — G9341 Metabolic encephalopathy: Secondary | ICD-10-CM | POA: Diagnosis not present

## 2021-06-08 DIAGNOSIS — N1832 Chronic kidney disease, stage 3b: Secondary | ICD-10-CM

## 2021-06-08 DIAGNOSIS — I639 Cerebral infarction, unspecified: Secondary | ICD-10-CM

## 2021-06-08 DIAGNOSIS — E1165 Type 2 diabetes mellitus with hyperglycemia: Secondary | ICD-10-CM

## 2021-06-08 DIAGNOSIS — E1110 Type 2 diabetes mellitus with ketoacidosis without coma: Secondary | ICD-10-CM | POA: Diagnosis not present

## 2021-06-08 DIAGNOSIS — D72829 Elevated white blood cell count, unspecified: Secondary | ICD-10-CM

## 2021-06-08 DIAGNOSIS — Z794 Long term (current) use of insulin: Secondary | ICD-10-CM

## 2021-06-08 LAB — GLUCOSE, CAPILLARY
Glucose-Capillary: 123 mg/dL — ABNORMAL HIGH (ref 70–99)
Glucose-Capillary: 149 mg/dL — ABNORMAL HIGH (ref 70–99)
Glucose-Capillary: 169 mg/dL — ABNORMAL HIGH (ref 70–99)
Glucose-Capillary: 176 mg/dL — ABNORMAL HIGH (ref 70–99)
Glucose-Capillary: 187 mg/dL — ABNORMAL HIGH (ref 70–99)
Glucose-Capillary: 189 mg/dL — ABNORMAL HIGH (ref 70–99)
Glucose-Capillary: 191 mg/dL — ABNORMAL HIGH (ref 70–99)
Glucose-Capillary: 192 mg/dL — ABNORMAL HIGH (ref 70–99)
Glucose-Capillary: 203 mg/dL — ABNORMAL HIGH (ref 70–99)
Glucose-Capillary: 220 mg/dL — ABNORMAL HIGH (ref 70–99)
Glucose-Capillary: 226 mg/dL — ABNORMAL HIGH (ref 70–99)
Glucose-Capillary: 278 mg/dL — ABNORMAL HIGH (ref 70–99)

## 2021-06-08 LAB — BASIC METABOLIC PANEL
Anion gap: 13 (ref 5–15)
Anion gap: 10 (ref 5–15)
BUN: 35 mg/dL — ABNORMAL HIGH (ref 8–23)
BUN: 32 mg/dL — ABNORMAL HIGH (ref 8–23)
CO2: 20 mmol/L — ABNORMAL LOW (ref 22–32)
CO2: 21 mmol/L — ABNORMAL LOW (ref 22–32)
Calcium: 9.3 mg/dL (ref 8.9–10.3)
Calcium: 8.7 mg/dL — ABNORMAL LOW (ref 8.9–10.3)
Chloride: 108 mmol/L (ref 98–111)
Chloride: 109 mmol/L (ref 98–111)
Creatinine, Ser: 1.35 mg/dL — ABNORMAL HIGH (ref 0.44–1.00)
Creatinine, Ser: 1.36 mg/dL — ABNORMAL HIGH (ref 0.44–1.00)
GFR, Estimated: 42 mL/min — ABNORMAL LOW (ref >60)
GFR, Estimated: 41 mL/min — ABNORMAL LOW (ref >60)
Glucose, Bld: 189 mg/dL — ABNORMAL HIGH (ref 70–99)
Glucose, Bld: 192 mg/dL — ABNORMAL HIGH (ref 70–99)
Potassium: 4.1 mmol/L (ref 3.5–5.1)
Potassium: 3.8 mmol/L (ref 3.5–5.1)
Sodium: 141 mmol/L (ref 135–145)
Sodium: 140 mmol/L (ref 135–145)

## 2021-06-08 LAB — CBC
HCT: 29.3 % — ABNORMAL LOW (ref 36.0–46.0)
Hemoglobin: 9.2 g/dL — ABNORMAL LOW (ref 12.0–15.0)
MCH: 24.5 pg — ABNORMAL LOW (ref 26.0–34.0)
MCHC: 31.4 g/dL (ref 30.0–36.0)
MCV: 77.9 fL — ABNORMAL LOW (ref 80.0–100.0)
Platelets: 499 10*3/uL — ABNORMAL HIGH (ref 150–400)
RBC: 3.76 MIL/uL — ABNORMAL LOW (ref 3.87–5.11)
RDW: 16.6 % — ABNORMAL HIGH (ref 11.5–15.5)
WBC: 24 10*3/uL — ABNORMAL HIGH (ref 4.0–10.5)
nRBC: 0 % (ref 0.0–0.2)

## 2021-06-08 LAB — LIPID PANEL
Cholesterol: 135 mg/dL (ref 0–200)
HDL: 29 mg/dL — ABNORMAL LOW (ref >40)
LDL Cholesterol: 67 mg/dL (ref 0–99)
Total CHOL/HDL Ratio: 4.7 RATIO
Triglycerides: 197 mg/dL — ABNORMAL HIGH (ref <150)
VLDL: 39 mg/dL (ref 0–40)

## 2021-06-08 LAB — MRSA NEXT GEN BY PCR, NASAL: MRSA by PCR Next Gen: NOT DETECTED

## 2021-06-08 LAB — BLOOD GAS, VENOUS
Acid-base deficit: 0.3 mmol/L (ref 0.0–2.0)
Bicarbonate: 22.3 mmol/L (ref 20.0–28.0)
Drawn by: 5212
FIO2: 28 %
O2 Saturation: 60 %
Patient temperature: 39
pCO2, Ven: 33 mmHg — ABNORMAL LOW (ref 44–60)
pH, Ven: 7.45 — ABNORMAL HIGH (ref 7.25–7.43)
pO2, Ven: 36 mmHg (ref 32–45)

## 2021-06-08 LAB — PROCALCITONIN: Procalcitonin: <0.1 ng/mL

## 2021-06-08 LAB — HEMOGLOBIN A1C
Hgb A1c MFr Bld: 10.3 % — ABNORMAL HIGH (ref 4.8–5.6)
Mean Plasma Glucose: 249 mg/dL

## 2021-06-08 LAB — BETA-HYDROXYBUTYRIC ACID: Beta-Hydroxybutyric Acid: 0.11 mmol/L (ref 0.05–0.27)

## 2021-06-08 IMAGING — US US CAROTID DUPLEX BILAT
1 series · 13 of 24 positions shown · non-contrast
Comparison: None.

CLINICAL DATA: Stroke, altered mental status, syncope, diabetes,
history of tobacco abuse

EXAM:
BILATERAL CAROTID DUPLEX ULTRASOUND
TECHNIQUE: Gray scale imaging, color Doppler and duplex ultrasound were
performed of bilateral carotid and vertebral arteries in the neck.

[Series 1: us carotid bilateral · 70 acquisitions, 13 frames shown]
[im 1/70]
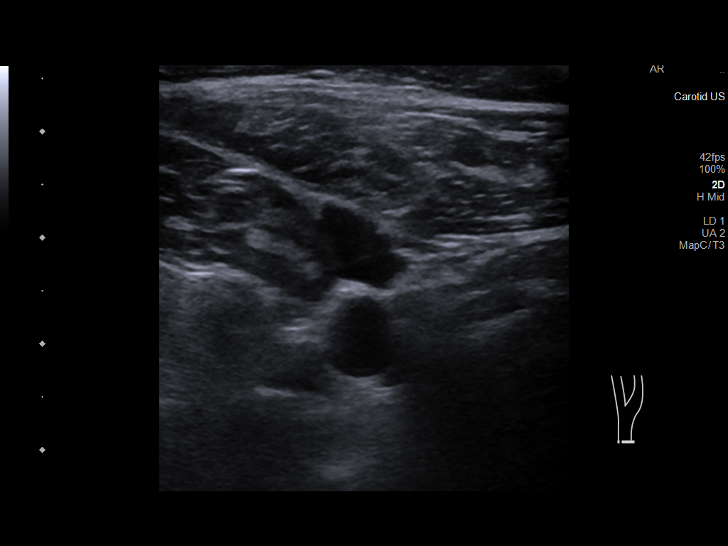
[im 7/70]
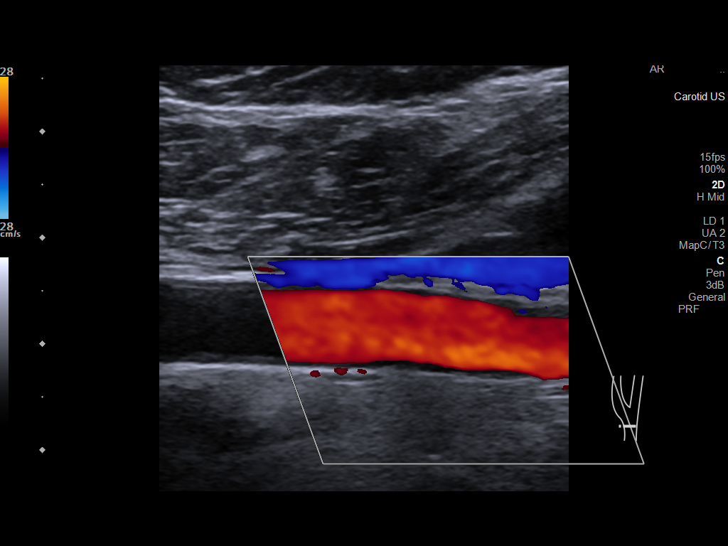
[im 13/70]
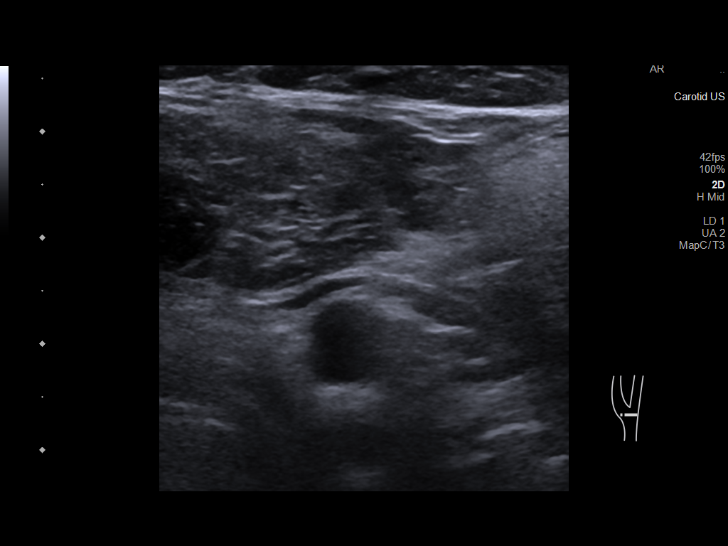
[im 19/70]
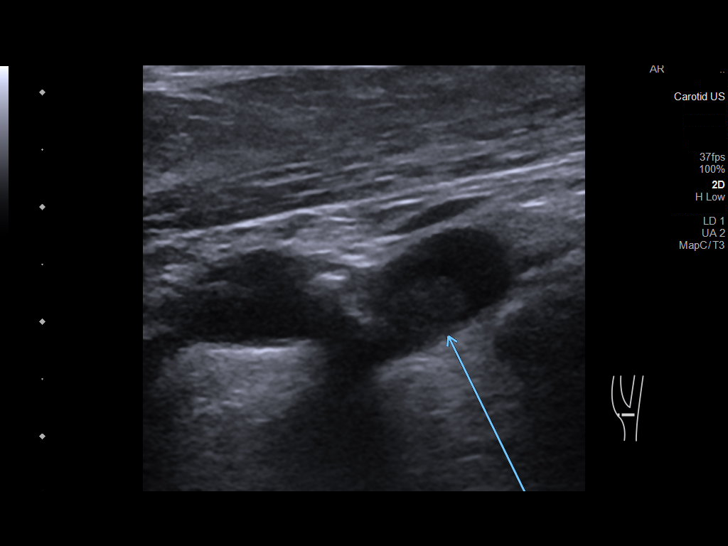
[im 22/70]
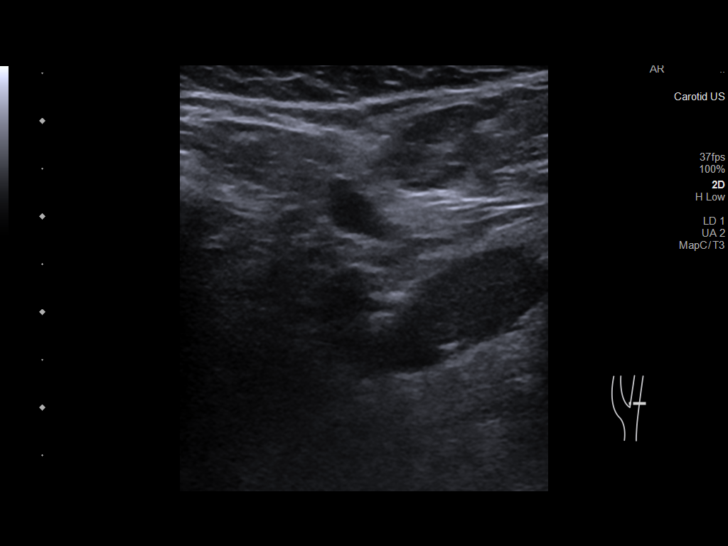
[im 28/70]
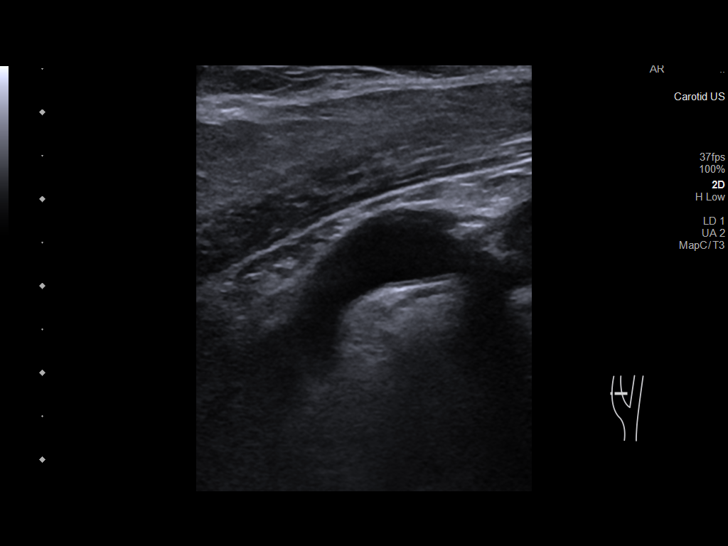
[im 37/70]
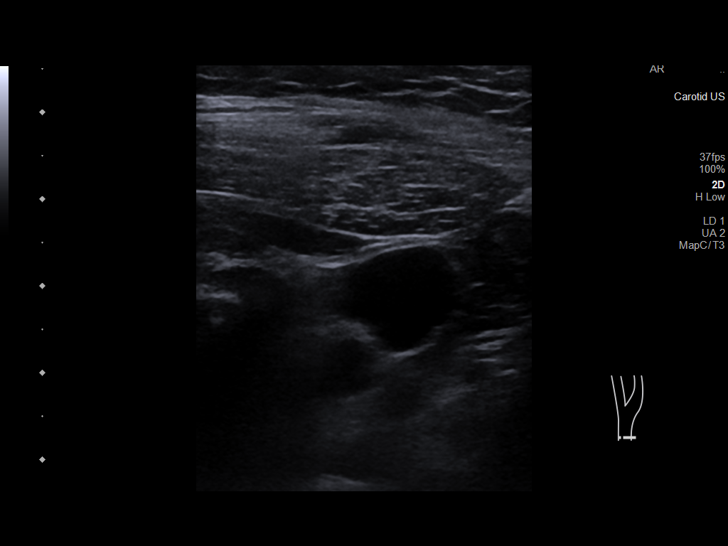
[im 40/70]
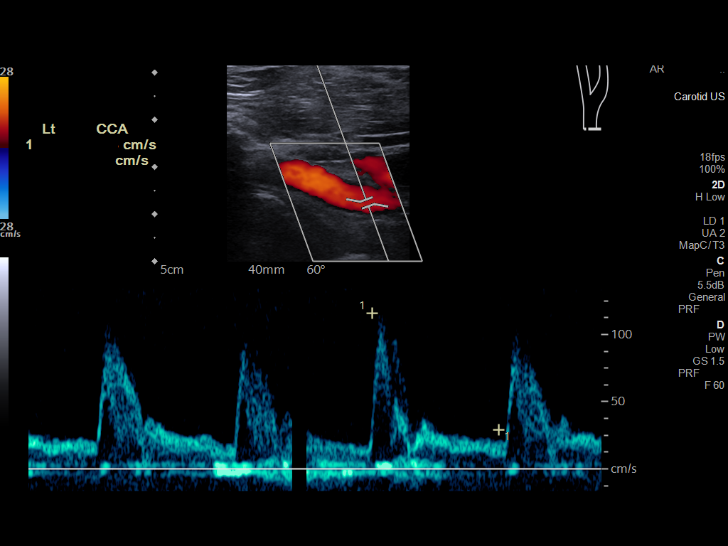
[im 46/70]
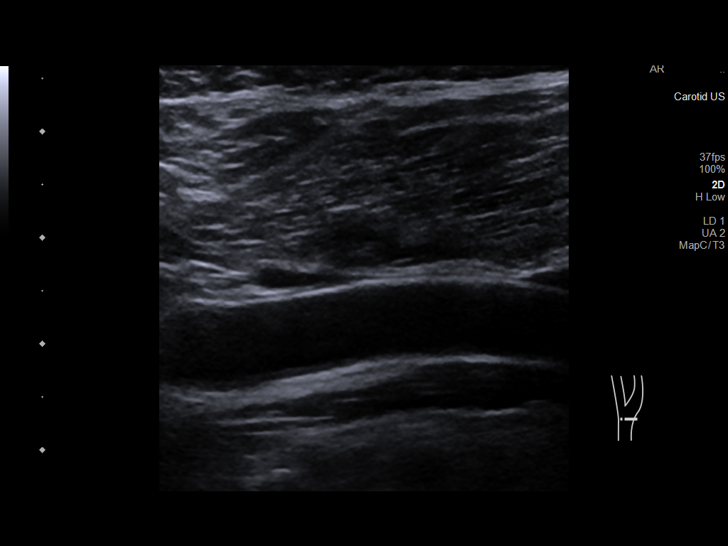
[im 52/70]
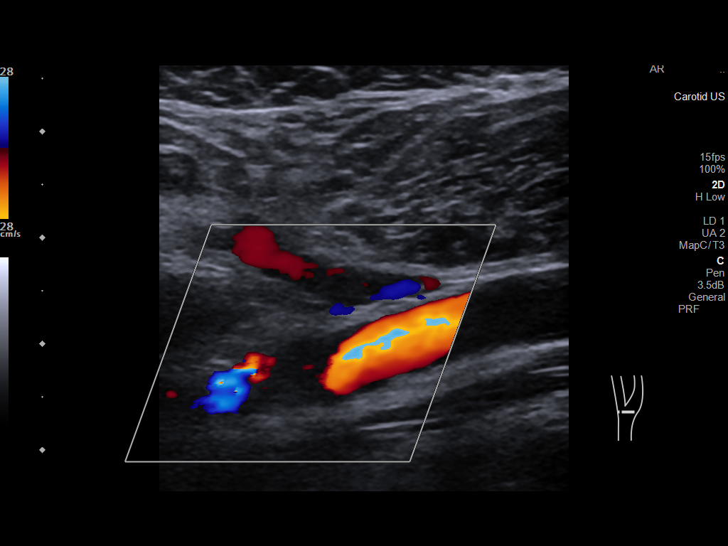
[im 58/70]
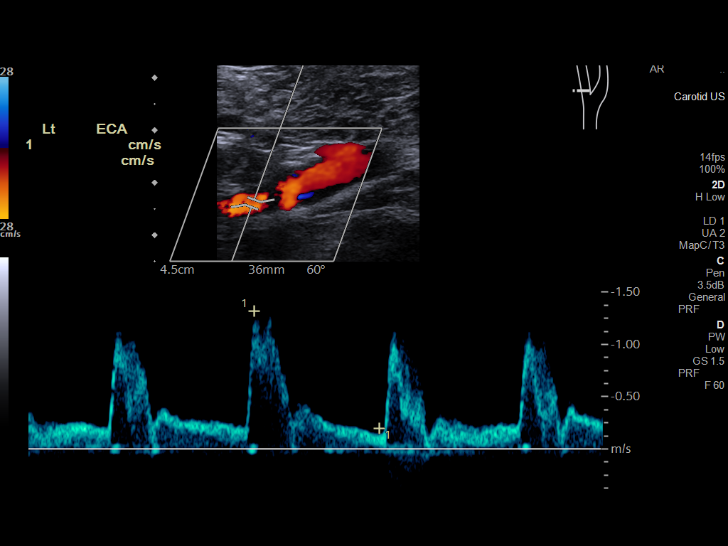
[im 64/70]
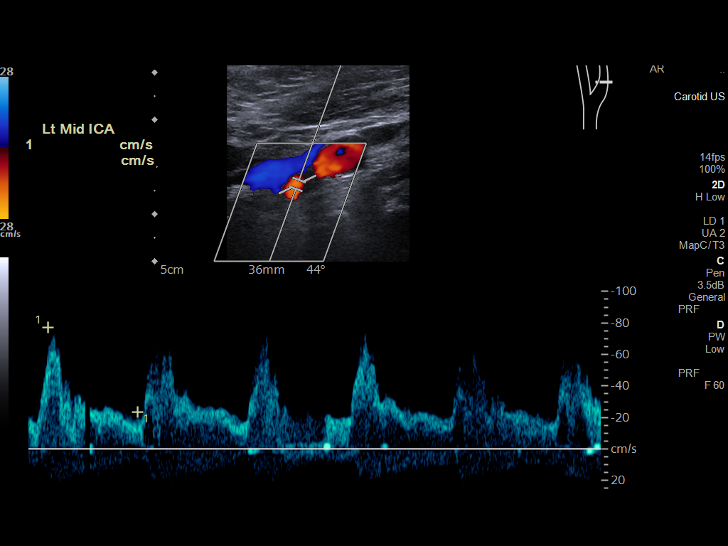
[im 70/70]
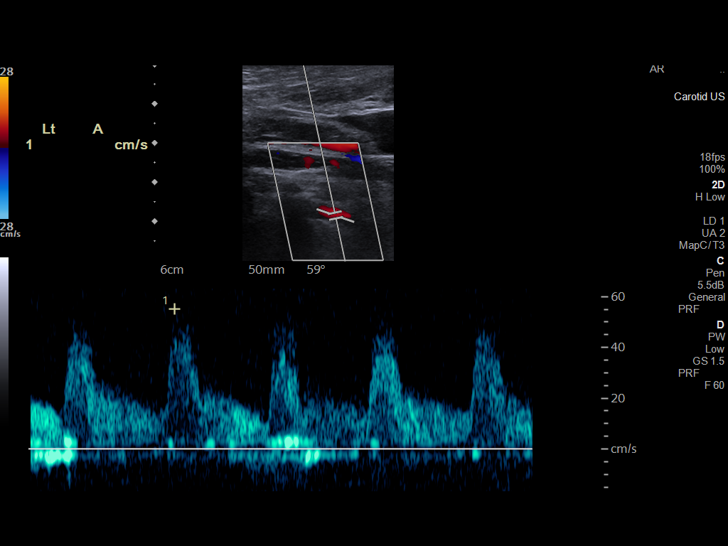

[13 of 24 positions shown; findings below may reference images not displayed]

FINDINGS: Criteria: Quantification of carotid stenosis is based on velocity
parameters that correlate the residual internal carotid diameter
with NASCET-based stenosis levels, using the diameter of the distal
internal carotid lumen as the denominator for stenosis measurement.

The following velocity measurements were obtained:

RIGHT

ICA: 75/14 cm/sec

CCA: 66/16 cm/sec

SYSTOLIC ICA/CCA RATIO:

ECA: 353 cm/sec

LEFT

ICA: 111/16 cm/sec

CCA: 122/30 cm/sec

SYSTOLIC ICA/CCA RATIO:

ECA: 132 cm/sec

RIGHT CAROTID ARTERY: Mixed hypoechoic and calcified plaque
extending intraluminally into the bulb with elevated peak systolic
velocities extending into the proximal right ECA. Plaque involves
the ICA origin resulting at least mild stenosis, but normal
waveforms and color Doppler signal throughout. The ICA is tortuous.

RIGHT VERTEBRAL ARTERY:  Normal flow direction and waveform.

LEFT CAROTID ARTERY: Eccentric plaque in the bulb and proximal ICA
resulting in at least mild stenosis. Normal waveforms and color
Doppler signal throughout.

LEFT VERTEBRAL ARTERY:  Normal flow direction and waveform.
IMPRESSION: 1. Mild left carotid bifurcation plaque resulting in less than 50%
diameter ICA stenosis.
2. Complex plaque extending into the lumen of the right carotid bulb
and involving the ECA origin, with markedly elevated peak systolic
velocities. Although the ICA velocities imply no narrowing greater
than 50%, CTA neck may be useful to assess better characterize the
intraluminal plaque in the bulb, a potential source of distal
emboli.
3.  Antegrade bilateral vertebral arterial flow.

## 2021-06-08 IMAGING — MR MR HEAD W/O CM
11 of 13 series · 25 of 48 positions shown · non-contrast
Comparison: Head CT [DATE].

CLINICAL DATA: Altered mental status, right gaze deviation, DKA.
Parkinson's disease. Weakness.

EXAM:
MRI HEAD WITHOUT CONTRAST
MRA HEAD WITHOUT CONTRAST
TECHNIQUE: Multiplanar, multi-echo pulse sequences of the brain and surrounding
structures were acquired without intravenous contrast. Angiographic
images of the Circle of Willis were acquired using MRA technique
without intravenous contrast.

[Series 5: DWI · axial · 4.0mm · 0.88mm/px · z∈[-44,+95]mm · 2 of 36 slices shown (1 of 6)]
[im 1/36]
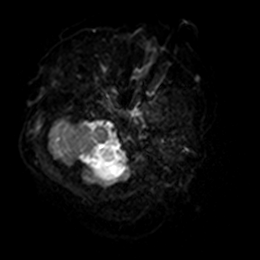
[im 36/36]
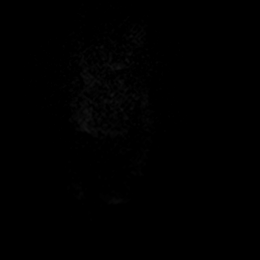

[Series 5: DWI · axial · 4.0mm · 0.88mm/px · z∈[-44,+95]mm · 3 of 36 slices shown (2 of 6)]
[im 1/36]
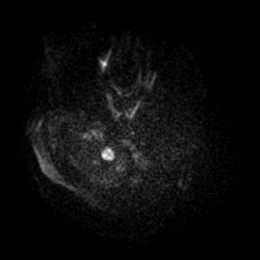
[im 18/36]
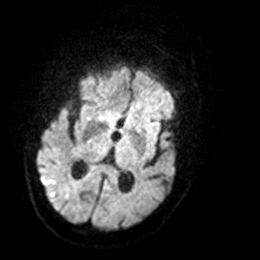
[im 36/36]
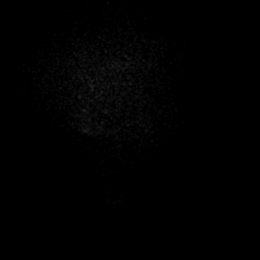

[Series 6: DWI · axial · 4.0mm · 0.88mm/px · z∈[-44,+91]mm · 3 of 35 slices shown (3 of 6)]
[im 1/35]
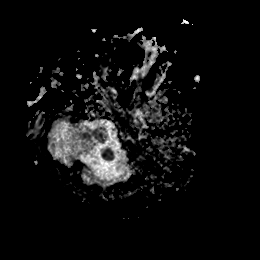
[im 18/35]
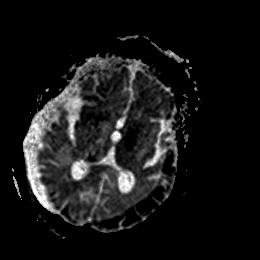
[im 35/35]
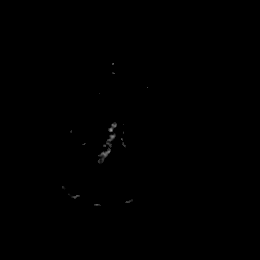

[Series 7: DWI · coronal · 5.0mm · 0.88mm/px · 2 of 28 slices shown (4 of 6)]
[im 1/28]
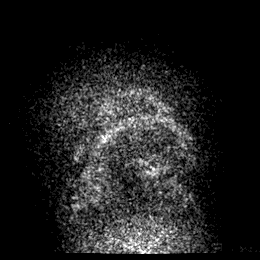
[im 28/28]
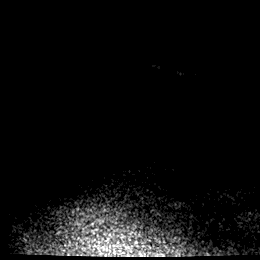

[Series 7: DWI · coronal · 5.0mm · 0.88mm/px · 2 of 28 slices shown (5 of 6)]
[im 1/28]
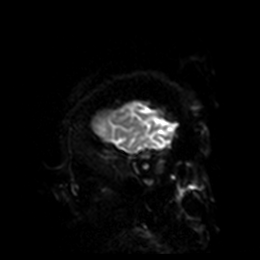
[im 28/28]
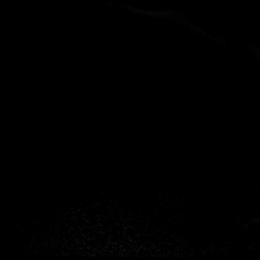

[Series 8: DWI · coronal · 5.0mm · 0.88mm/px · 2 of 26 slices shown (6 of 6)]
[im 1/26]
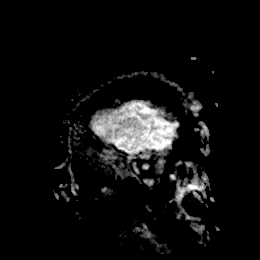
[im 26/26]
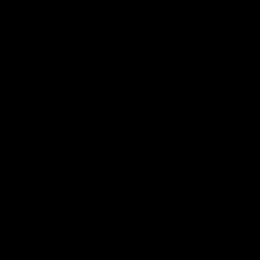

[Series 13: T1 · sagittal · 5.0mm · 0.94mm/px · 2 of 21 slices shown]
[im 1/21]
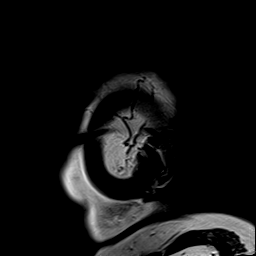
[im 21/21]
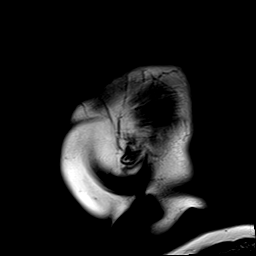

[Series 19: T2 · axial · 5.0mm · 0.72mm/px · z∈[-16,+117]mm · 2 of 20 slices shown (1 of 2)]
[im 1/20]
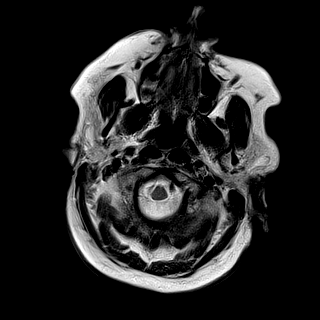
[im 20/20]
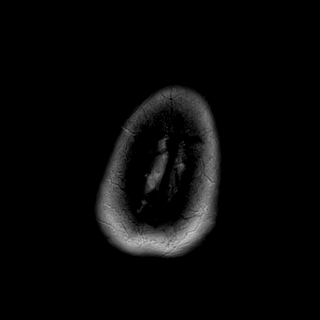

[Series 20: ax hemo · axial · 5.0mm · 0.86mm/px · z∈[-23,+120]mm · 2 of 25 slices shown]
[im 1/25]
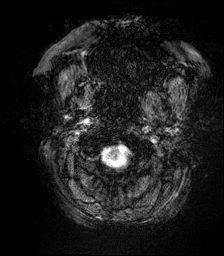
[im 25/25]
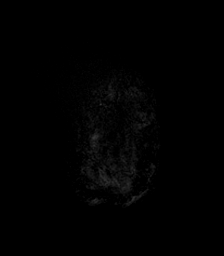

[Series 21: FLAIR · axial · 4.0mm · 0.43mm/px · z∈[-25,+122]mm · 3 of 38 slices shown]
[im 1/38]
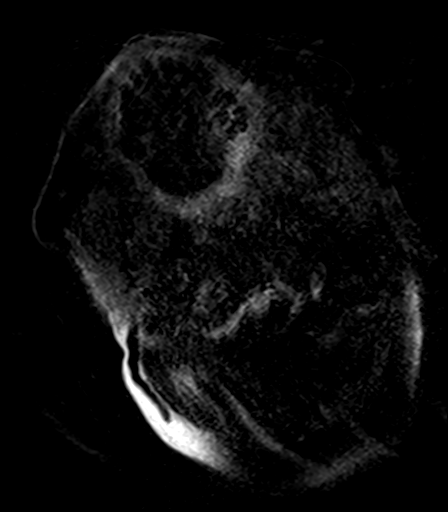
[im 19/38]
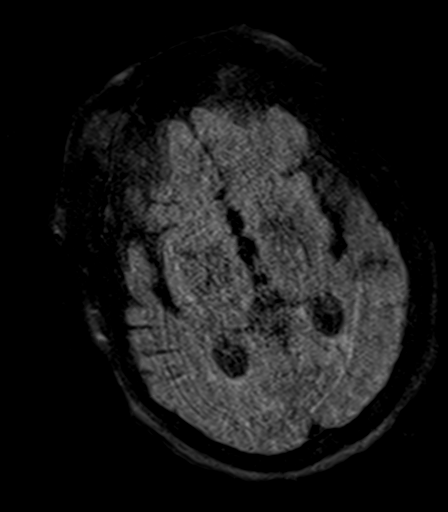
[im 38/38]
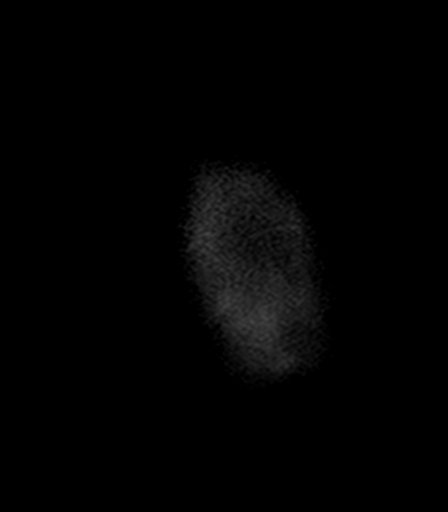

[Series 23: T2 · coronal · 5.0mm · 0.72mm/px · 2 of 28 slices shown (2 of 2)]
[im 1/28]
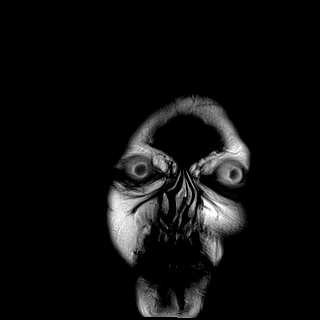
[im 28/28]
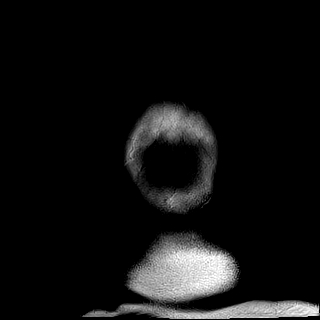

[25 of 48 positions shown; findings below may reference images not displayed]

FINDINGS: MRI HEAD FINDINGS

The examination is intermittently motion degraded, limiting
evaluation. Most notably, there is mild-to-moderate motion
degradation of the axial diffusion-weighted imaging, severe motion
degradation of the coronal diffusion-weighted imaging,
moderate/severe motion degradation of the sagittal T1 weighted
sequence, severe motion degradation of the axial T2 FLAIR sequence,
moderate motion degradation of the axial T2* sequence, severe motion
degradation of the axial T1 weighted sequence and moderate motion
degradation of the coronal T2 TSE sequence.

Brain:

Mild generalized parenchymal atrophy.

There are multiple small patchy acute cortical/subcortical infarcts
within the right cerebral hemisphere within the posterior frontal
lobe, parietal lobe, lateral occipital lobe and posterior temporal
lobe. These infarcts involve the posterior right MCA vascular
territory, as well as right MCA/PCA watershed territory. A few small
acute infarcts also affect the right ACA/MCA watershed territory.

Background chronic small vessel ischemic changes within the cerebral
white matter, incompletely assessed due to the degree of motion
degradation.

Probable chronic lacunar infarcts within the right basal ganglia.

Tiny chronic infarcts within the left cerebellar hemisphere.

Within the limitations of motion degradation, no intracranial mass,
chronic intracranial blood products or extra-axial fluid collection
is identified. No midline shift.

Vascular: Maintained flow voids within the proximal large arterial
vessels.

Skull and upper cervical spine: Within the limitations of motion
degradation, no focal suspicious marrow lesion is identified.
Incompletely assessed cervical spondylosis.

Sinuses/Orbits: Visualized orbits show no acute finding. Minimal
mucosal thickening within the right ethmoid air cells.

Other: Trace fluid within the right mastoid air cells.

MRA HEAD FINDINGS

Significantly motion degraded examination. This significantly limits
evaluation for, and quantification of, intracranial arterial
stenoses.

Anterior circulation:

The intracranial internal carotid arteries are patent. The M1 middle
cerebral arteries are patent. Atherosclerotic irregularity of the M2
and more distal middle cerebral artery vessels bilaterally. No
definite M2 proximal branch occlusion is identified. The anterior
cerebral arteries are patent. Developmentally hypoplastic left A1
segment. Atherosclerotic irregularity of the A2 and more distal
anterior cerebral arteries. Within the limitations of motion
degradation, no intracranial aneurysm is identified.

Posterior circulation:

The intracranial vertebral arteries are patent. The left vertebral
artery is dominant. Atherosclerotic irregularity of the intracranial
right vertebral artery. The basilar artery is patent. The posterior
cerebral arteries are patent. Posterior communicating arteries are
present bilaterally.

Anatomic variants: None significant.
IMPRESSION: MRI brain:

1. Significantly motion degraded examination, as described and
limiting evaluation.
2. Multiple small patchy acute cortical/subcortical infarcts within
the right cerebral hemisphere affecting the posterior frontal lobe,
parietal lobe, lateral occipital lobe and posterior temporal lobe.
These infarcts affect the posterior right MCA vascular territory,
right MCA/PCA watershed territory and right MCA/ACA watershed
territory.
3. Background chronic small vessel ischemic disease with chronic
lacunar infarcts, as described.
4. Mild generalized cerebral atrophy.

MRA head:

1. Significantly motion degraded examination, as described and
limiting evaluation.
2. Within this limitation, no definite proximal large vessel
occlusion is identified.
3. Incompletely assessed intracranial atherosclerotic disease.

## 2021-06-08 MED ORDER — ASPIRIN 300 MG RE SUPP: 300.0000 mg | Freq: Every day | RECTAL | Status: DC

## 2021-06-08 MED ORDER — ASPIRIN EC 81 MG PO TBEC
81.0000 mg | DELAYED_RELEASE_TABLET | Freq: Every day | ORAL | Status: DC
  Administered 2021-06-09 – 2021-06-15 (×7): 81 mg via ORAL
  Filled 2021-06-08 (×7): qty 1

## 2021-06-08 MED ORDER — CITALOPRAM HYDROBROMIDE 20 MG PO TABS
40.0000 mg | ORAL_TABLET | Freq: Every day | ORAL | Status: DC
  Administered 2021-06-08 – 2021-06-15 (×8): 40 mg via ORAL
  Filled 2021-06-08 (×8): qty 2

## 2021-06-08 MED ORDER — ACETAMINOPHEN 325 MG PO TABS
650.0000 mg | ORAL_TABLET | Freq: Four times a day (QID) | ORAL | Status: DC | PRN
  Administered 2021-06-11 (×2): 650 mg via ORAL
  Filled 2021-06-08 (×2): qty 2

## 2021-06-08 MED ORDER — INSULIN ASPART 100 UNIT/ML IJ SOLN
12.0000 [IU] | Freq: Three times a day (TID) | INTRAMUSCULAR | Status: DC
  Administered 2021-06-08 – 2021-06-09 (×6): 12 [IU] via SUBCUTANEOUS

## 2021-06-08 MED ORDER — ACETAMINOPHEN 650 MG RE SUPP
650.0000 mg | Freq: Four times a day (QID) | RECTAL | Status: DC | PRN
  Administered 2021-06-08: 650 mg via RECTAL
  Filled 2021-06-08: qty 1

## 2021-06-08 MED ORDER — LEVOTHYROXINE SODIUM 88 MCG PO TABS
88.0000 ug | ORAL_TABLET | Freq: Every day | ORAL | Status: DC
  Administered 2021-06-09 – 2021-06-15 (×7): 88 ug via ORAL
  Filled 2021-06-08 (×7): qty 1

## 2021-06-08 MED ORDER — SIMVASTATIN 20 MG PO TABS
40.0000 mg | ORAL_TABLET | Freq: Every day | ORAL | Status: DC
Start: 2021-06-08 — End: 2021-06-10
  Administered 2021-06-08 – 2021-06-09 (×2): 40 mg via ORAL
  Filled 2021-06-08 (×2): qty 2

## 2021-06-08 MED ORDER — LACTATED RINGERS IV SOLN: INTRAVENOUS | Status: DC

## 2021-06-08 MED ORDER — LEVOTHYROXINE SODIUM 25 MCG PO TABS
50.0000 ug | ORAL_TABLET | Freq: Every day | ORAL | Status: DC
  Administered 2021-06-08: 50 ug via ORAL
  Filled 2021-06-08: qty 2

## 2021-06-08 MED ORDER — INSULIN ASPART 100 UNIT/ML IJ SOLN
0.0000 [IU] | Freq: Every day | INTRAMUSCULAR | Status: DC
  Administered 2021-06-09 – 2021-06-11 (×3): 2 [IU] via SUBCUTANEOUS

## 2021-06-08 MED ORDER — INSULIN ASPART 100 UNIT/ML IJ SOLN
0.0000 [IU] | Freq: Three times a day (TID) | INTRAMUSCULAR | Status: DC
  Administered 2021-06-08: 3 [IU] via SUBCUTANEOUS
  Administered 2021-06-08: 2 [IU] via SUBCUTANEOUS
  Administered 2021-06-08: 3 [IU] via SUBCUTANEOUS
  Administered 2021-06-09 (×2): 8 [IU] via SUBCUTANEOUS
  Administered 2021-06-09: 3 [IU] via SUBCUTANEOUS
  Administered 2021-06-10: 11 [IU] via SUBCUTANEOUS
  Administered 2021-06-10: 5 [IU] via SUBCUTANEOUS
  Administered 2021-06-10: 11 [IU] via SUBCUTANEOUS
  Administered 2021-06-11 (×2): 15 [IU] via SUBCUTANEOUS
  Administered 2021-06-12 (×2): 8 [IU] via SUBCUTANEOUS
  Administered 2021-06-12: 3 [IU] via SUBCUTANEOUS

## 2021-06-08 MED ORDER — LORAZEPAM 2 MG/ML IJ SOLN: 1.0000 mg | INTRAMUSCULAR | Status: DC | PRN

## 2021-06-08 MED ORDER — INSULIN GLARGINE-YFGN 100 UNIT/ML ~~LOC~~ SOLN
45.0000 [IU] | SUBCUTANEOUS | Status: DC
  Administered 2021-06-08 – 2021-06-10 (×3): 45 [IU] via SUBCUTANEOUS
  Filled 2021-06-08 (×6): qty 0.45

## 2021-06-08 MED ORDER — CHLORHEXIDINE GLUCONATE CLOTH 2 % EX PADS
6.0000 | MEDICATED_PAD | Freq: Every day | CUTANEOUS | Status: DC
  Administered 2021-06-08 – 2021-06-12 (×3): 6 via TOPICAL

## 2021-06-08 MED ORDER — CARBIDOPA-LEVODOPA 25-100 MG PO TABS
1.0000 | ORAL_TABLET | Freq: Three times a day (TID) | ORAL | Status: DC
  Administered 2021-06-08 – 2021-06-15 (×22): 1 via ORAL
  Filled 2021-06-08 (×29): qty 1

## 2021-06-08 MED ORDER — ENSURE ENLIVE PO LIQD
237.0000 mL | Freq: Two times a day (BID) | ORAL | Status: DC
  Administered 2021-06-08 – 2021-06-14 (×10): 237 mL via ORAL

## 2021-06-08 MED ORDER — STROKE: EARLY STAGES OF RECOVERY BOOK: Freq: Once | Status: AC

## 2021-06-08 MED ORDER — ASPIRIN 325 MG PO TABS
325.0000 mg | ORAL_TABLET | Freq: Every day | ORAL | Status: DC
  Administered 2021-06-08: 325 mg via ORAL
  Filled 2021-06-08: qty 1

## 2021-06-08 MED ORDER — PROPRANOLOL HCL 20 MG PO TABS
20.0000 mg | ORAL_TABLET | Freq: Three times a day (TID) | ORAL | Status: DC
  Administered 2021-06-08 (×3): 20 mg via ORAL
  Filled 2021-06-08 (×3): qty 1

## 2021-06-08 MED ORDER — CHLORPROMAZINE HCL 25 MG PO TABS
100.0000 mg | ORAL_TABLET | Freq: Every day | ORAL | Status: DC
  Administered 2021-06-08 – 2021-06-14 (×7): 100 mg via ORAL
  Filled 2021-06-08: qty 1
  Filled 2021-06-08 (×5): qty 4
  Filled 2021-06-08: qty 1
  Filled 2021-06-08 (×2): qty 4

## 2021-06-08 NOTE — TOC Initial Note (Signed)
Transition of Care (TOC) - Initial/Assessment Note  ? ? ?Patient Details  ?Name: Margaret Mcdaniel ?MRN: [id number] ?Date of Birth: March 28, 1950 ? ?Transition of Care (TOC) CM/SW Contact:    ?Salome Arnt, LCSW ?Phone Number: ?06/08/2021, 11:47 AM ? ?Clinical Narrative:  Pt admitted due to DKA. LCSW completed assessment for high risk readmission score. Per pt's daughter-in-law, Margaret Mcdaniel, pt lives alone and is fairly independent with ADLs. She requires 2L home O2 ([name]) and wears cpap at night. Pt fell yesterday and was brought to hospital by EMS. Family is hopeful pt can return home at d/c. [facility] will continue to follow and assist with d/c planning.                 ? ? ?Expected Discharge Plan: Home/Self Care ?Barriers to Discharge: Continued Medical Work up ? ? ?Patient Goals and CMS Choice ?Patient states their goals for this hospitalization and ongoing recovery are:: hopeful for return home ?  ?Choice offered to / list presented to : Adult Children ? ?Expected Discharge Plan and Services ?Expected Discharge Plan: Home/Self Care ?In-house Referral: Clinical Social Work ?  ?  ?Living arrangements for the past 2 months: [place] ?                ?  ?  ?  ?  ?  ?  ?  ?  ?  ?  ? ?Prior Living Arrangements/Services ?Living arrangements for the past 2 months: [place] ?Lives with:: Self ?Patient language and need for interpreter reviewed:: Yes ?Do you feel safe going back to the place where you live?: Yes      ?Need for Family Participation in Patient Care: Yes (Comment) ?  ?Current home services: DME (home O2, cpap, elevated toilet seat) ?Criminal Activity/Legal Involvement Pertinent to Current Situation/Hospitalization: No - Comment as needed ? ?Activities of Daily Living ?Home Assistive Devices/Equipment: Oxygen ?ADL Screening (condition at time of admission) ?Patient's cognitive ability adequate to safely complete daily activities?: No ?Is the patient deaf or have difficulty hearing?: No ?Does  the patient have difficulty seeing, even when wearing glasses/contacts?: No ?Does the patient have difficulty concentrating, remembering, or making decisions?: Yes ?Patient able to express need for assistance with ADLs?: No ?Does the patient have difficulty dressing or bathing?: Yes ?Independently performs ADLs?: No ?Communication: Dependent ?Is this a change from baseline?: Change from baseline, expected to last >3 days ?Dressing (OT): Dependent ?Is this a change from baseline?: Change from baseline, expected to last >3 days ?Grooming: Dependent ?Is this a change from baseline?: Change from baseline, expected to last >3 days ?Feeding: Needs assistance ?Is this a change from baseline?: Change from baseline, expected to last <3 days ?Bathing: Needs assistance ?Toileting: Independent with device (comment) (with purewick) ?Does the patient have difficulty walking or climbing stairs?: Yes ?Weakness of Legs: Both ?Weakness of Arms/Hands: None ? ?Permission Sought/Granted ?  ?  ?   ?   ?   ?   ? ?Emotional Assessment ?  ?Attitude/Demeanor/Rapport: Unable to Assess ?Affect (typically observed): Unable to Assess ?Orientation: : Oriented to Self ?Alcohol / Substance Use: Not Applicable ?Psych Involvement: No (comment) ? ?Admission diagnosis:  DKA (diabetic ketoacidosis) ([place]) [E11.10] ?Hyperglycemia [R73.9] ?AKI (acute kidney injury) ([place]) [N17.9] ?History of COPD [Z87.09] ?Parkinson's disease ([place]) [G20] ?Diabetic ketoacidosis without coma associated with diabetes mellitus due to underlying condition ([place]) [E08.10] ?Patient Active Problem List  ? Diagnosis Date Noted  ? DKA (diabetic ketoacidosis) ([place]) 06/07/2021  ? Acute  metabolic encephalopathy 19/37/9024  ? AKI (acute kidney injury) ([place]) 06/07/2021  ? Leukocytosis 06/07/2021  ? Malignant neoplasm of left female breast ([place])   ? Encephalopathy 08/25/2016  ? COPD (chronic obstructive pulmonary disease) ([place])   ? Secondary Parkinson disease ([place]) 06/29/2016  ? RLS  (restless legs syndrome) 06/29/2016  ? Fever 06/21/2016  ? Pressure injury of skin 06/13/2016  ? Pyelonephritis 06/08/2016  ? Hypothyroidism 06/08/2016  ? Sepsis ([place]) 06/08/2016  ? CKD (chronic kidney disease) stage 3, GFR 30-59 ml/min (HCC) 06/08/2016  ? Uncontrolled type 2 diabetes mellitus with hyperglycemia, with long-term current use of insulin ([place]) 06/08/2016  ? Breast cancer metastasized to axillary lymph node, left ([place]) 04/14/2016  ? ?PCP:  [name], MD ?Pharmacy:   ?[facility] [address] [address] ?Phone: [phone] Fax: [phone] ? ? ? ? ?Social Determinants of Health (SDOH) Interventions ?  ? ?Readmission Risk Interventions ? ?  06/08/2021  ? 11:26 AM  ?Readmission Risk Prevention Plan  ?Transportation Screening Complete  ?[place] or Home Care Consult Complete  ?Social Work Consult for [place] Planning/Counseling Complete  ?Palliative Care Screening Not Applicable  ?Medication Review Press photographer) Complete  ? ? ? ?

## 2021-06-08 NOTE — Plan of Care (Signed)
?  Problem: Education: ?Goal: Knowledge of General Education information will improve ?Description: Including pain rating scale, medication(s)/side effects and non-pharmacologic comfort measures ?Outcome: Progressing ?  ?Problem: Health Behavior/Discharge Planning: ?Goal: Ability to manage health-related needs will improve ?Outcome: Progressing ?  ?Problem: Clinical Measurements: ?Goal: Ability to maintain clinical measurements within normal limits will improve ?Outcome: Progressing ?Goal: Will remain free from infection ?Outcome: Progressing ?Goal: Diagnostic test results will improve ?Outcome: Progressing ?Goal: Respiratory complications will improve ?Outcome: Progressing ?Goal: Cardiovascular complication will be avoided ?Outcome: Progressing ?  ?Problem: Activity: ?Goal: Risk for activity intolerance will decrease ?Outcome: Progressing ?  ?Problem: Nutrition: ?Goal: Adequate nutrition will be maintained ?Outcome: Progressing ?  ?Problem: Coping: ?Goal: Level of anxiety will decrease ?Outcome: Progressing ?  ?Problem: Elimination: ?Goal: Will not experience complications related to bowel motility ?Outcome: Progressing ?Goal: Will not experience complications related to urinary retention ?Outcome: Progressing ?  ?Problem: Pain Managment: ?Goal: General experience of comfort will improve ?Outcome: Progressing ?  ?Problem: Safety: ?Goal: Ability to remain free from injury will improve ?Outcome: Progressing ?  ?Problem: Skin Integrity: ?Goal: Risk for impaired skin integrity will decrease ?Outcome: Progressing ?  ?Problem: Education: ?Goal: Ability to describe self-care measures that may prevent or decrease complications (Diabetes Survival Skills Education) will improve ?Outcome: Progressing ?Goal: Individualized Educational Video(s) ?Outcome: Progressing ?  ?Problem: Health Behavior/Discharge Planning: ?Goal: Ability to identify and utilize available resources and services will improve ?Outcome: Progressing ?Goal:  Ability to manage health-related needs will improve ?Outcome: Progressing ?  ?Problem: Metabolic: ?Goal: Ability to maintain appropriate glucose levels will improve ?Outcome: Progressing ?  ?Problem: Nutritional: ?Goal: Maintenance of adequate nutrition will improve ?Outcome: Progressing ?Goal: Maintenance of adequate weight for body size and type will improve ?Outcome: Progressing ?  ?Problem: Respiratory: ?Goal: Will regain and/or maintain adequate ventilation ?Outcome: Progressing ?  ?

## 2021-06-08 NOTE — Progress Notes (Signed)
?PROGRESS NOTE ? ? ?Margaret Mcdaniel  IDP:[id number] DOB: Jan 06, 1951 DOA: 06/07/2021 ?PCP: [name], MD  ? ?Chief Complaint  ?Patient presents with  ? Hyperglycemia  ? ?Level of care: Telemetry ? ?Brief Admission History:  ?71 y.o. female with medical history significant for diabetes mellitus, Parkinson's disease, COPD respiratory failure on 2 L, CKD 3, breast cancer. ?  ?Patient was brought to the ED from home, reports of altered mental status and high blood sugar.  Patient's son found patient on the floor.  Patient's son talked to patient yesterday at about 11 in the morning and this was the last time patient was here was seen normal.  Today family called patient, patient did not appear to be normal on the phone, and her breathing was labored.  They went over to check on patient and saw patient on the floor, she got up on her knees but could not stand up.   EMS was called, blood sugar read high.  Patient was on room air, her O2 sats were 82%.  At the time of my evaluation, patient is altered, unable to provide any history.  Daughter and son at bedside, they report that in January patient blood sugars were low in the 40s, medications were gradually reduced and stopped.  She was on [place], Ozempic.  She is currently still on metformin. ?  ?ED Course: Temperature 97.7.  Tachycardic heart rate 190 117.  Respirate rate 18-32.  Blood pressure systolic 443X to 540G.  O2 Sats 100% on 2 L.  Blood sugar 785, anion gap of 20, serum bicarb of 15.  Leukocytosis of 23.3.  Beta Hydroxybutyric acid 2.1. ?1.8 L bolus given, hospitalist to admit for DKA. ? ? ?06/08/2021 ?MRI reveals: Multiple small patchy acute cortical/subcortical infarcts within the right cerebral hemisphere affecting the posterior frontal lobe, parietal lobe, lateral occipital lobe and posterior temporal lobe.  These infarcts affect the posterior right MCA vascular territory, right MCA/PCA watershed territory and right MCA/ACA watershed territory.  ? ?Neurology  consultation requested.  Stroke orderset initiated. ?Pt transitioned off IV insulin to subcutaneous insulin.   ? ?  ?Assessment and Plan: ?* DKA (diabetic ketoacidosis) ([place]) ?Blood sugars 785.  With anion gap of 20, serum bicarb of 15.  Beta hydroxy butyrate acid significantly elevated at 2.1.  Patient was previously on Tresiba, also 70/30 is On medication list.  Daughter reports her insulins medications were gradually titrated down and discontinued after episodes of hypoglycemia.  She is currently on metformin.  ?- Per care everywhere last visit 04/2021, patient was to continue Xultophy 40 units(insulin degludec and liraglutide) and metformin ?-Pt was treated with IV insulin EndoTool protocol ?-After acidosis corrected patient was transitioned to subcutaneous insulin basal bolus SSI regimen ?-continue frequent CBG monitoring ?- semglee 45 units, plus novolog 12 units TID with meals if eats 50% or more of meal, SSI, CBG 5 times per day ?CBG (last 3)  ?Recent Labs  ?  06/08/21 ?0751 06/08/21 ?0829 06/08/21 ?1124  ?GLUCAP 189* 169* 149*  ? ? ?Acute CVA (cerebrovascular accident) ([place]) ?--MRI confirms acute CVA.  Stroke orderset initiated ?--check TTE ?--follow up lipid panel and A1c ?--aspirin ordered for antiplatelet therapy  ?--neurology consultation requested ?--stroke swallow screen requested  ?--I was able to notify son and DIL at bedside today ?--further recommendations pending consultation with [name]  ? ?Leukocytosis ?WBC 23.3, chronically elevated, last check on file 3 to 4 years ago, persistent leukocytosis ranging from 11-18..  Chest x-ray clear.  Afebrile.  At this time no focus of infection identified. ?-UA pending ?-Trend for now ?-May need to follow-up with hem/onc on discharge ? ?AKI (acute kidney injury) ([place]) ?AKI on CKD 3A-B.  Creatinine 1.8, baseline to 1.3. ?- Hydrate ? ?Acute metabolic encephalopathy ?Acute metabolic encephalopathy with baseline Parkinson's disease.  Patient very confused,  attempting to climb out of bed, staring off to the right side, appears to have left-sided neglect.  Moving all extremities unable to determine degree of strength or weakness if present.  Son talked to patient yesterday 4/1 at 80 in the morning.  Son found patient on the floor today. ?-Secondary to acute CVA ?- Slowly coming back around to baseline per family  ?- CVA treatment per neurology recommendations ? ?COPD (chronic obstructive pulmonary disease) ([place]) ?Stable. ?- Resume home regimen ? ?Secondary Parkinson disease ([place]) ?--resume home medications  ? ?Uncontrolled type 2 diabetes mellitus with hyperglycemia, with long-term current use of insulin ([place]) ?--follow up A1c ?--reports multiple treatment changes and adjustments recently per DM coordinator ?--with new CVA will need swallow eval to determine what type of diet is going to be required ? ?CBG (last 3)  ?Recent Labs  ?  06/08/21 ?0751 06/08/21 ?0829 06/08/21 ?1124  ?GLUCAP 189* 169* 149*  ? ? ? ?CKD (chronic kidney disease) stage 3, GFR 30-59 ml/min (HCC) ?--renally dose medications as needed ?--following renal function closely ?--currently hydrating with IVF as part of acute stroke treatment  ? ?DVT prophylaxis: SCDs ?Code Status: Full  ?Family Communication: son, DIL 4/3  ?Disposition: Status is: Inpatient ?Remains inpatient appropriate because: acute stroke treatment  ?  ?Consultants:  ?Neurology  ?Procedures:  ?MRI/MRA brain ?Antimicrobials:  ?  ?Subjective: ?Pt not verbalizing at this time, mumbling.  ?Objective: ?Vitals:  ? 06/08/21 0447 06/08/21 0500 06/08/21 0600 06/08/21 0706  ?BP:  (!) 158/53 138/65   ?Pulse: (!) 105 (!) 108  97  ?Resp: (!) 27 (!) 23  (!) 22  ?Temp: (!) 100.9 ?F (38.3 ?C)   99.9 ?F (37.7 ?C)  ?TempSrc: Axillary   Axillary  ?SpO2: 99% 100%  100%  ?Weight:      ?Height:      ? ? ?Intake/Output Summary (Last 24 hours) at 06/08/2021 1314 ?Last data filed at 06/08/2021 0600 ?Gross per 24 hour  ?Intake 1406.7 ml  ?Output --  ?Net 1406.7 ml   ? ?Filed Weights  ? 06/07/21 1942 06/08/21 0031 06/08/21 0300  ?Weight: 91 kg 83.8 kg 83.9 kg  ? ?Examination: ? ?General exam: pt very confused, with resting tremors seen, nonverbal mostly, incoherent, NAD.   ?Respiratory system: no increased work of breathing seen.  ?Cardiovascular system: normal S1 & S2 heard. No JVD, murmurs, rubs, gallops or clicks. No pedal edema. ?Gastrointestinal system: Abdomen is nondistended, soft and nontender. No organomegaly or masses felt. Normal bowel sounds heard. ?Central nervous system: somnolent but arousable, right facial droop, mild left hemiparesis.  ?Extremities: mild left  hemiparesis.  ?Skin: No rashes, lesions or ulcers. ?Psychiatry: Judgement and insight appear UTD. Mood & affect UTD.  ? ?Data Reviewed: I have personally reviewed following labs and imaging studies ? ?CBC: ?Recent Labs  ?Lab 06/07/21 ?2014 06/08/21 ?0426  ?WBC 23.3* 24.0*  ?HGB 10.3* 9.2*  ?HCT 33.3* 29.3*  ?MCV 79.3* 77.9*  ?PLT 606* 499*  ? ? ?Basic Metabolic Panel: ?Recent Labs  ?Lab 06/07/21 ?2014 06/07/21 ?2211 06/08/21 ?0205 06/08/21 ?7672  ?NA 132* 137 141 140  ?K 4.8 4.6 4.1 3.8  ?CL 97* 103 108 109  ?  CO2 15* 16* 20* 21*  ?GLUCOSE 785* 563* 189* 192*  ?BUN 44* 42* 35* 32*  ?CREATININE 1.82* 1.64* 1.35* 1.36*  ?CALCIUM 8.8* 9.2 9.3 8.7*  ? ? ?CBG: ?Recent Labs  ?Lab 06/08/21 ?0165 06/08/21 ?5374 06/08/21 ?8270 06/08/21 ?7867 06/08/21 ?1124  ?GLUCAP 192* 187* 189* 169* 149*  ? ? ?Recent Results (from the past 240 hour(s))  ?Resp Panel by RT-PCR (Flu A&B, Covid) Nasopharyngeal Swab     Status: None  ? Collection Time: 06/07/21  7:58 PM  ? Specimen: Nasopharyngeal Swab; Nasopharyngeal(NP) swabs in vial transport medium  ?Result Value Ref Range Status  ? SARS Coronavirus 2 by RT PCR NEGATIVE NEGATIVE Final  ?  Comment: (NOTE) ?SARS-CoV-2 target nucleic acids are NOT DETECTED. ? ?The SARS-CoV-2 RNA is generally detectable in upper respiratory ?specimens during the acute phase of infection. The  lowest ?concentration of SARS-CoV-2 viral copies this assay can detect is ?138 copies/mL. A negative result does not preclude SARS-Cov-2 ?infection and should not be used as the sole basis for treatment or ?other

## 2021-06-08 NOTE — Progress Notes (Signed)
Initial Nutrition Assessment ? ?DOCUMENTATION CODES:  ? ?Obesity unspecified ? ?INTERVENTION:  ?- Liberalize diet from a heart healthy/carb modified to a carb modified diet to provide widest variety of menu options to enhance nutritional adequacy ? ?- Ensure Enlive po BID, each supplement provides 350 kcal and 20 grams of protein. ? ?- Provided "High Protein Nutrition Therapy" handout to AVS for snack ideas per family request  ? ?NUTRITION DIAGNOSIS:  ? ?Increased nutrient needs related to acute illness as evidenced by estimated needs ? ?GOAL:  ? ?Patient will meet greater than or equal to 90% of their needs ? ?MONITOR:  ? ?PO intake, Supplement acceptance, Labs, Weight trends, Diet advancement ? ?REASON FOR ASSESSMENT:  ? ?Malnutrition Screening Tool ?  ? ?ASSESSMENT:  ? ?Pt admitted form home with AMS and high blood sugar secondary to DKA. PMH significant for diabetes, Parkinson's disease, COPD respiratory failure on 2L, CKD 3, and breast cancer. ? ?Pt's son, Aaron Edelman, and daughter-in-law, [name], at bedside providing history for pt. Pt observed eating lunch tray during visit and completed 90% during visit. Pt is able to feed herself and has been independent with mobility and activities PTA. 5 years ago, she received chemotherapy for breast cancer and they state that this changed her taste for food and skin changes. They state that she has been eating ~2 meals per day at home around her sleep schedule. Her son and daughter-in-law do the grocery shopping for her. She likes to have frozen meals, as she does not cook and this is easy to prepare. For breakfast she may have oatmeal or cheerios, however she is easily bored with foods and they express having trouble finding foods she may like. She also has snacks such as peanut butter crackers throughout the day. We discussed addition of a protein containing nutrition supplement as a snack to help with hydration and adequate nutrition. They requested a handout with easy  snack options to include at home. Provided in the AVS as family was leaving after visit.   ? ?They expressed frustrations over finding an insulin regimen that works for her without having consistently low blood sugars and high blood sugars. The pt has lots of concerns and fears of hypoglycemia.  ? ?Her family states that her usual weight ranges from 190-210 lbs and they deny significant recent weight loss. At her last appointment 4 weeks ago, they report a weight of 201 lbs. Reviewed weight history. Limited documentation of recent weights. Current admit weight 83.9 kg (185 lbs). Unable to confirm this weight loss based on documented history of weights. Will continue to monitor throughout admission. ? ?Medications: SSI 0-15 units TID, SSI 0-5 units QHS, SSI 12 units TID, semglee 45 units daily ? ?Labs: BUN 32, Cr 1.36,  calcium 8.7, GFR 41, CBG's 169-226 x24 hours ? ?NUTRITION - FOCUSED PHYSICAL EXAM: ? ?Flowsheet Row Most Recent Value  ?Orbital Region No depletion  ?Upper Arm Region No depletion  ?Thoracic and Lumbar Region No depletion  ?Buccal Region Mild depletion  ?Temple Region No depletion  ?Clavicle Bone Region No depletion  ?Clavicle and Acromion Bone Region No depletion  ?Scapular Bone Region No depletion  ?Dorsal Hand No depletion  ?Patellar Region Mild depletion  ?Anterior Thigh Region Mild depletion  ?Posterior Calf Region Mild depletion  ?Edema (RD Assessment) None  ?Hair Reviewed  ?Eyes Reviewed  ?Mouth Reviewed  ?Skin Reviewed  ?Nails Reviewed  ? ?  ? ?Diet Order:   ?Diet Order   ? ?       ?  Diet heart healthy/carb modified Room service appropriate? Yes; Fluid consistency: Thin  Diet effective now       ?  ? ?  ?  ? ?  ? ?EDUCATION NEEDS:  ? ?Education needs have been addressed ? ?Skin:  Skin Assessment: Reviewed RN Assessment ? ?Last BM:  PTA ? ?Height:  ? ?Ht Readings from Last 1 Encounters:  ?06/08/21 '5\' 4"'$  (1.626 m)  ? ? ?Weight:  ? ?Wt Readings from Last 1 Encounters:  ?06/08/21 83.9 kg   ? ? ?Ideal Body Weight:  54.5 kg ? ?BMI:  Body mass index is 31.75 kg/m?. ? ?Estimated Nutritional Needs:  ? ?Kcal:  1500-1700 ? ?Protein:  75-90g ? ?Fluid:  >/=1.5L ? ?Clayborne Dana, RDN, LDN ?Clinical Nutrition ?

## 2021-06-08 NOTE — Assessment & Plan Note (Addendum)
--  MRI confirms acute CVA.  Stroke orderset initiated ?--check TTE ?--follow up lipid panel (LDL<70) and A1c 10.3% ?--aspirin ordered for antiplatelet therapy  ?--neurology consultation requested ?--stroke swallow screen requested  ?--I was able to discuss with son and DIL about need for rehabilitation ?--further recommendations per [name] (neurology) ?

## 2021-06-08 NOTE — Assessment & Plan Note (Signed)
--  renally dose medications as needed ?--following renal function closely ?--currently hydrating with IVF as part of acute stroke treatment  ?

## 2021-06-08 NOTE — Progress Notes (Signed)
Inpatient Diabetes Program Recommendations ? ?AACE/ADA: New Consensus Statement on Inpatient Glycemic Control  ? ?Target Ranges:  Prepandial:   less than 140 mg/dL ?     Peak postprandial:   less than 180 mg/dL (1-2 hours) ?     Critically ill patients:  140 - 180 mg/dL  ? ? Latest Reference Range & Units 06/08/21 00:25 06/08/21 01:37 06/08/21 02:32 06/08/21 03:25 06/08/21 04:26 06/08/21 05:34 06/08/21 06:50 06/08/21 07:51  ?Glucose-Capillary 70 - 99 mg/dL 278 (H) 203 (H) 191 (H) 220 (H) 226 (H) 192 (H) 187 (H) 189 (H)  ? ? Latest Reference Range & Units 06/07/21 20:14  ?Beta-Hydroxybutyric Acid 0.05 - 0.27 mmol/L 2.10 (H)  ?Glucose 70 - 99 mg/dL 785 (HH)  ? ?Review of Glycemic Control ? ?Diabetes history: DM2 ?Outpatient Diabetes medications: Metformin XR 1000 mg daily, Tresiba 50 units QHS (not taking), 70/30 30 units BID  (not taking), Xultophy 40 units daily (prescribed per PCP note on 04/15/21 but not on med list) ?Current orders for Inpatient glycemic control: IV insulin; will transition to Semglee 45 units Q24H, Novolog 0-15 units TID with meals, Novolog 0-5 units QHS, Novolog 12 units TID with meals ? ?Inpatient Diabetes Program Recommendations:   ? ?Insulin: Noted patient has transition orders for IV to SQ insulin. ? ?HbgA1C:  A1C in process. ? ?NOTE: Patient admitted with DKA with initial glucose of 785 mg/dl on 06/07/21. Patient was started in IV insulin and has transition orders for SQ insulin ordered to start this morning.  Per H&P on 06/07/21 "Patient was previously on Tresiba, also 70/30 is On medication list.  Daughter reports her insulins medications were gradually titrated down and discontinued after episodes of hypoglycemia.  She is currently on metformin. Per care everywhere last visit 04/2021, patient was to continue Xultophy 40 units(insulin degludec and liraglutide) and metformin."  ? ?Thanks, ?Barnie Alderman, RN, MSN, CDE ?Diabetes Coordinator ?Inpatient Diabetes Program ?[phone] (Team Pager from  8am to 5pm) ? ? ?

## 2021-06-08 NOTE — Assessment & Plan Note (Signed)
--  resume home medications 

## 2021-06-08 NOTE — Progress Notes (Addendum)
I connected with  [name] on 06/08/21 by a video enabled telemedicine application and verified that I am speaking with the correct person using two identifiers. ?  ?I discussed the limitations of evaluation and management by telemedicine. The patient expressed understanding and agreed to proceed. ? ?Neurology Consultation ?Reason for Consult: stroke ?Referring Physician: Dr Irwin Brakeman ? ?CC: DKA, found down ? ?History is obtained from: chart review as patient is confused ? ?HPI: Margaret Mcdaniel is a 71 y.o. female with past medical history of Parkinson's disease, diabetes, COPD with chronic respiratory failure on 3 L of oxygen, CKD level 3, breast cancer who was brought in on 06/07/2021 after being found down at home with high blood sugar (785)  Patient was diagnosed with diabetic ketoacidosis and was started on insulin.  Patient was also noted to be confused and therefore MRI brain was obtained which showed acute strokes.  Therefore neurology was consulted. ? ?Event happened at home ? LKN:  06/06/2021 at 11am ?No tpa as outaide window ?No thrombectomy as no LVO ?mRS 0 ? ?ROS: Unable to obtain due to altered mental status.  ? ?Past Medical History:  ?Diagnosis Date  ? Anxiety   ? Arthritis   ? Breast cancer metastasized to axillary lymph node, left ([place]) 04/14/2016  ? Cancer [facility])   ? left breast  ? CKD (chronic kidney disease) stage 3, GFR 30-59 ml/min ([facility]) 06/08/2016  ? COPD (chronic obstructive pulmonary disease) ([place])   ? Diabetes mellitus without complication ([place])   ? GERD (gastroesophageal reflux disease)   ? History of kidney stones   ? Hypothyroidism   ? Mental disorder   ? Neuromuscular disorder ([place])   ? PONV (postoperative nausea and vomiting)   ? RLS (restless legs syndrome) 06/29/2016  ? Secondary Parkinson disease ([place]) 06/29/2016  ? Sleep apnea   ? Uses CPAP  ? Uncontrolled type 2 diabetes mellitus with hyperglycemia, with long-term current use of insulin ([place]) 06/08/2016  ? ? ?Family History   ?Problem Relation Age of Onset  ? Heart disease Mother   ? Bipolar disorder Father   ? Heart disease Father   ? Prostate cancer Brother   ? ? ?Social History:  reports that she quit smoking about 7 years ago. Her smoking use included cigarettes. She has a 50.00 pack-year smoking history. She has never used smokeless tobacco. She reports that she does not drink alcohol and does not use drugs. ? ? ?Medications Prior to Admission  ?Medication Sig Dispense Refill Last Dose  ? carbidopa-levodopa (SINEMET IR) 25-100 MG tablet Take 1 tablet by mouth 3 (three) times daily. 90 tablet 5 Past Week  ? chlorproMAZINE (THORAZINE) 50 MG tablet Take 100 mg by mouth at bedtime.    Past Week  ? citalopram (CELEXA) 40 MG tablet Take 40 mg by mouth daily.   Past Week  ? GVOKE PFS 1 MG/0.2ML SOSY Inject 1 mg into the skin See admin instructions. Inject if Blood sugar <50   unk  ? levothyroxine (SYNTHROID) 88 MCG tablet Take 88 mcg by mouth daily before breakfast.   Past Week  ? metFORMIN (GLUCOPHAGE-XR) 500 MG 24 hr tablet Take 1,000 mg by mouth daily.   Past Week  ? polyethylene glycol powder (GLYCOLAX/MIRALAX) powder Take 17 g by mouth daily.    Past Week  ? propranolol (INDERAL) 20 MG tablet Take 20 mg by mouth 3 (three) times daily.   Past Week  ? simvastatin (ZOCOR) 40 MG tablet Take 40  mg by mouth at bedtime.   Past Week  ? allopurinol (ZYLOPRIM) 100 MG tablet Take 100 mg by mouth daily. (Patient not taking: Reported on 06/07/2021)   Not Taking  ? aspirin EC 81 MG tablet Take 81 mg by mouth daily. (Patient not taking: Reported on 06/07/2021)   Not Taking  ? ferrous sulfate 325 (65 FE) MG tablet Take 325 mg by mouth daily with breakfast. (Patient not taking: Reported on 06/07/2021)   Not Taking  ? insulin degludec (TRESIBA) 100 UNIT/ML FlexTouch Pen Inject 50 Units into the skin at bedtime.  (Patient not taking: Reported on 06/07/2021)   Not Taking  ? mirtazapine (REMERON) 15 MG tablet Take 15 mg by mouth at bedtime. (Patient not taking:  Reported on 06/07/2021)   Not Taking  ? niacin 500 MG CR capsule Take 500 mg by mouth at bedtime. (Patient not taking: Reported on 06/07/2021)   Not Taking  ? nitrofurantoin (MACRODANTIN) 100 MG capsule Take 100 mg by mouth daily. (Patient not taking: Reported on 06/07/2021)   Not Taking  ? NOVOLOG MIX 70/30 FLEXPEN (70-30) 100 UNIT/ML FlexPen Inject 30 Units into the skin 2 (two) times daily with a meal.  (Patient not taking: Reported on 06/07/2021)   Not Taking  ? omeprazole (PRILOSEC) 40 MG capsule Take 40 mg by mouth daily. (Patient not taking: Reported on 06/07/2021)   Not Taking  ? temazepam (RESTORIL) 15 MG capsule Take 15 mg by mouth at bedtime. (Patient not taking: Reported on 06/07/2021)   Not Taking  ? tiZANidine (ZANAFLEX) 4 MG tablet Take 8 mg by mouth 2 (two) times daily. (Patient not taking: Reported on 06/07/2021)   Not Taking  ? traMADol (ULTRAM) 50 MG tablet Take 1 tablet (50 mg total) by mouth every 12 (twelve) hours as needed. (Patient not taking: Reported on 06/07/2021) 15 tablet 0 Not Taking  ?  ? ? ?Exam: ?Current vital signs: ?BP 138/65   Pulse 97   Temp 99.9 ?F (37.7 ?C) (Axillary)   Resp (!) 22   Ht '5\' 4"'$  (1.626 m)   Wt 83.9 kg   SpO2 100%   BMI 31.75 kg/m?  ?Vital signs in last 24 hours: ?Temp:  [97.7 ?F (36.5 ?C)-101.9 ?F (38.8 ?C)] 99.9 ?F (37.7 ?C) (04/03 4132) ?Pulse Rate:  [97-125] 97 (04/03 0706) ?Resp:  [18-32] 22 (04/03 0706) ?BP: (99-163)/(44-123) 138/65 (04/03 0600) ?SpO2:  [99 %-100 %] 100 % (04/03 0706) ?Weight:  [83.8 kg-91 kg] 83.9 kg (04/03 0300) ? ? ?Physical Exam  ?Constitutional: Appears well-developed and well-nourished.  ?Eyes: No scleral injection ?HENT: No OP obstruction ?Neuro: awake, oriented to self, follows commands, unable to name objects, left hemianopia vs hemineglect, EOMI, PERLA, right gaze preference, no apparent facial asymmetry, left hemiparesis with drift, right anitgravity strength without drift, unable to do FTN   ? ?1A: Level of Consciousness - 1 ?1B: Ask Month  and Age - 0 ?1C: 'Blink Eyes' & 'Squeeze Hands' - 0 ?2: Test Horizontal Extraocular Movements - 0 ?3: Test Visual Fields - 2 ?4: Test Facial Palsy - 0 ?5A: Test Left Arm Motor Drift - 2 ?5B: Test Right Arm Motor Drift - 0 ?6A: Test Left Leg Motor Drift - 2 ?6B: Test Right Leg Motor Drift - 0 ?7: Test Limb Ataxia - 0 ?8: Test Sensation - 1 ?9: Test Language/Aphasia- 0 ?10: Test Dysarthria - 0 ?11: Test Extinction/Inattention - 2  ?NIHSS score: 10 ? ? ?I have reviewed labs in epic and the results  pertinent to this consultation are: ?CBC:  ?Recent Labs  ?Lab 06/07/21 ?2014 06/08/21 ?0426  ?WBC 23.3* 24.0*  ?HGB 10.3* 9.2*  ?HCT 33.3* 29.3*  ?MCV 79.3* 77.9*  ?PLT 606* 499*  ? ? ?Basic Metabolic Panel:  ?Lab Results  ?Component Value Date  ? NA 140 06/08/2021  ? K 3.8 06/08/2021  ? CO2 21 (L) 06/08/2021  ? GLUCOSE 192 (H) 06/08/2021  ? BUN 32 (H) 06/08/2021  ? CREATININE 1.36 (H) 06/08/2021  ? CALCIUM 8.7 (L) 06/08/2021  ? GFRNONAA 41 (L) 06/08/2021  ? GFRAA 54 (L) 10/28/2017  ? ?Lipid Panel:  ?Lab Results  ?Component Value Date  ? [place] 67 06/08/2021  ? ?HgbA1c: No results found for: HGBA1C ?Urine Drug Screen:  ?   ?Component Value Date/Time  ? [place] DETECTED 07/14/2012 2002  ? [place] DETECTED 07/14/2012 2002  ? [place] DETECTED 07/14/2012 2002  ? [place] DETECTED 07/14/2012 2002  ? [place] DETECTED 07/14/2012 2002  ? [place] DETECTED 07/14/2012 2002  ?  ?Alcohol Level  ?   ?Component Value Date/Time  ? ETH <11 07/14/2012 2005  ? ? ? ?I have reviewed the images obtained: ? ?CT head without contrast 06/07/2021: Motion degraded exam with no acute intracranial abnormalities ? ?MRI brain without contrast 06/08/2021: ?1. Significantly motion degraded examination, as described and ?limiting evaluation. ?2. Multiple small patchy acute cortical/subcortical infarcts within the right cerebral hemisphere affecting the posterior frontal lobe, parietal lobe, lateral occipital lobe and posterior  temporal lobe. These infarcts affect the posterior right MCA vascular territory, right MCA/PCA watershed territory and right MCA/ACA watershed territory. ?3. Background chronic small vessel ischemic disease with

## 2021-06-08 NOTE — Discharge Instructions (Addendum)
1)STOP Aspirin once INR is > 2.0 ?2)Take Coumadin 5 mg daily, recheck PT/INR on Wednesday, 06/17/2021 and adjust Coumadin accordingly ?3) repeat CBC and BMP on Wednesday, 06/17/2021 ?4)insulin aspart (novoLOG) injection 0-16 Units  ?0-10 Units Subcutaneous, 3 times daily with meals  ?CBG < 70: Implement Hypoglycemia Standing Orders and refer to Hypoglycemia Standing Orders sidebar report   ?CBG 70 - 120: 0 unit ?CBG 121 - 150: 0 unit ? CBG 151 - 200: 3 unit  ?CBG 201 - 250:  5 units  ?CBG 251 - 300:  8 units  ?CBG 301 - 350:  12 units   ?CBG 351 - 400:  14 units ? CBG > 400: 16 units ? ?5)Avoid ibuprofen/Advil/Aleve/Motrin/[place]y Powders/Naproxen/BC powders/Meloxicam/Diclofenac/Indomethacin and other Nonsteroidal anti-inflammatory medications as these will make you more likely to bleed and can cause stomach ulcers, can also cause Kidney problems. ?

## 2021-06-08 NOTE — Hospital Course (Addendum)
71 y.o. female with medical history significant for diabetes mellitus, Parkinson's disease, COPD respiratory failure on 2 L, CKD 3, breast cancer. ?  ?Patient was brought to the ED from home, reports of altered mental status and high blood sugar.  Patient's son found patient on the floor.  Patient's son talked to patient yesterday at about 11 in the morning and this was the last time patient was here was seen normal.  Today family called patient, patient did not appear to be normal on the phone, and her breathing was labored.  They went over to check on patient and saw patient on the floor, she got up on her knees but could not stand up.   EMS was called, blood sugar read high.  Patient was on room air, her O2 sats were 82%.  At the time of my evaluation, patient is altered, unable to provide any history.  Daughter and son at bedside, they report that in January patient blood sugars were low in the 40s, medications were gradually reduced and stopped.  She was on [place], Ozempic.  She is currently still on metformin. ?  ?ED Course: Temperature 97.7.  Tachycardic heart rate 190 117.  Respirate rate 18-32.  Blood pressure systolic 220U to 542H.  O2 Sats 100% on 2 L.  Blood sugar 785, anion gap of 20, serum bicarb of 15.  Leukocytosis of 23.3.  Beta Hydroxybutyric acid 2.1. ?1.8 L bolus given, hospitalist to admit for DKA. ? ? ?06/08/2021 ?MRI reveals: Multiple small patchy acute cortical/subcortical infarcts within the right cerebral hemisphere affecting the posterior frontal lobe, parietal lobe, lateral occipital lobe and posterior temporal lobe.  These infarcts affect the posterior right MCA vascular territory, right MCA/PCA watershed territory and right MCA/ACA watershed territory.  ? ?Neurology consultation requested.  Stroke orderset initiated. ?Pt transitioned off IV insulin to subcutaneous insulin.   ? ?06/09/2021:  Pt is going to need extensive rehabilitation.  PT/OT/SLP evaluations. Spoke with family who is  agreeable to rehab.  Held off on CTA due to concerns about using contrast dye with poor renal function. Continue to hydrate today.  ?

## 2021-06-08 NOTE — Assessment & Plan Note (Signed)
--  follow up A1c ?--reports multiple treatment changes and adjustments recently per DM coordinator ?--with new CVA will need swallow eval to determine what type of diet is going to be required ? ?CBG (last 3)  ?Recent Labs  ?  06/08/21 ?0751 06/08/21 ?0829 06/08/21 ?1124  ?GLUCAP 189* 169* 149*  ? ? ?

## 2021-06-09 ENCOUNTER — Inpatient Hospital Stay (HOSPITAL_COMMUNITY): Payer: PPO

## 2021-06-09 DIAGNOSIS — I6389 Other cerebral infarction: Secondary | ICD-10-CM | POA: Diagnosis not present

## 2021-06-09 DIAGNOSIS — I639 Cerebral infarction, unspecified: Secondary | ICD-10-CM | POA: Diagnosis not present

## 2021-06-09 DIAGNOSIS — N1832 Chronic kidney disease, stage 3b: Secondary | ICD-10-CM | POA: Diagnosis not present

## 2021-06-09 DIAGNOSIS — R7881 Bacteremia: Secondary | ICD-10-CM | POA: Diagnosis present

## 2021-06-09 DIAGNOSIS — E1110 Type 2 diabetes mellitus with ketoacidosis without coma: Secondary | ICD-10-CM | POA: Diagnosis not present

## 2021-06-09 DIAGNOSIS — G9341 Metabolic encephalopathy: Secondary | ICD-10-CM | POA: Diagnosis not present

## 2021-06-09 LAB — CBC WITH DIFFERENTIAL/PLATELET
Abs Immature Granulocytes: 0.08 10*3/uL — ABNORMAL HIGH (ref 0.00–0.07)
Basophils Absolute: 0.1 10*3/uL (ref 0.0–0.1)
Basophils Relative: 0 %
Eosinophils Absolute: 0.2 10*3/uL (ref 0.0–0.5)
Eosinophils Relative: 1 %
HCT: 25.9 % — ABNORMAL LOW (ref 36.0–46.0)
Hemoglobin: 7.7 g/dL — ABNORMAL LOW (ref 12.0–15.0)
Immature Granulocytes: 1 %
Lymphocytes Relative: 33 %
Lymphs Abs: 4.2 10*3/uL — ABNORMAL HIGH (ref 0.7–4.0)
MCH: 24.8 pg — ABNORMAL LOW (ref 26.0–34.0)
MCHC: 29.7 g/dL — ABNORMAL LOW (ref 30.0–36.0)
MCV: 83.3 fL (ref 80.0–100.0)
Monocytes Absolute: 1.1 10*3/uL — ABNORMAL HIGH (ref 0.1–1.0)
Monocytes Relative: 9 %
Neutro Abs: 7.1 10*3/uL (ref 1.7–7.7)
Neutrophils Relative %: 56 %
Platelets: 333 10*3/uL (ref 150–400)
RBC: 3.11 MIL/uL — ABNORMAL LOW (ref 3.87–5.11)
RDW: 17.1 % — ABNORMAL HIGH (ref 11.5–15.5)
WBC: 12.6 10*3/uL — ABNORMAL HIGH (ref 4.0–10.5)
nRBC: 0 % (ref 0.0–0.2)

## 2021-06-09 LAB — BASIC METABOLIC PANEL
Anion gap: 8 (ref 5–15)
BUN: 26 mg/dL — ABNORMAL HIGH (ref 8–23)
CO2: 21 mmol/L — ABNORMAL LOW (ref 22–32)
Calcium: 8.1 mg/dL — ABNORMAL LOW (ref 8.9–10.3)
Chloride: 112 mmol/L — ABNORMAL HIGH (ref 98–111)
Creatinine, Ser: 1.19 mg/dL — ABNORMAL HIGH (ref 0.44–1.00)
GFR, Estimated: 49 mL/min — ABNORMAL LOW (ref >60)
Glucose, Bld: 191 mg/dL — ABNORMAL HIGH (ref 70–99)
Potassium: 3.7 mmol/L (ref 3.5–5.1)
Sodium: 141 mmol/L (ref 135–145)

## 2021-06-09 LAB — BLOOD CULTURE ID PANEL (REFLEXED) - BCID2

## 2021-06-09 LAB — ECHOCARDIOGRAM COMPLETE
AR max vel: 2.2 cm2
AV Area VTI: 2.13 cm2
AV Area mean vel: 2.26 cm2
AV Mean grad: 10 mmHg
AV Peak grad: 21 mmHg
Ao pk vel: 2.29 m/s
Area-P 1/2: 2.8 cm2
Calc EF: 68.4 %
Height: 64 in
MV VTI: 1.78 cm2
S' Lateral: 2.7 cm
Single Plane A2C EF: 65.5 %
Single Plane A4C EF: 66.1 %
Weight: 2959.46 oz

## 2021-06-09 LAB — HEMOGLOBIN A1C
Hgb A1c MFr Bld: 10.3 % — ABNORMAL HIGH (ref 4.8–5.6)
Mean Plasma Glucose: 249 mg/dL

## 2021-06-09 LAB — FOLATE: Folate: 10.1 ng/mL (ref >5.9)

## 2021-06-09 LAB — C-PEPTIDE: C-Peptide: 1 ng/mL — ABNORMAL LOW (ref 1.1–4.4)

## 2021-06-09 LAB — IRON AND TIBC
Iron: 45 ug/dL (ref 28–170)
Saturation Ratios: 11 % (ref 10.4–31.8)
TIBC: 417 ug/dL (ref 250–450)
UIBC: 372 ug/dL

## 2021-06-09 LAB — GLUCOSE, CAPILLARY
Glucose-Capillary: 151 mg/dL — ABNORMAL HIGH (ref 70–99)
Glucose-Capillary: 189 mg/dL — ABNORMAL HIGH (ref 70–99)
Glucose-Capillary: 261 mg/dL — ABNORMAL HIGH (ref 70–99)
Glucose-Capillary: 229 mg/dL — ABNORMAL HIGH (ref 70–99)
Glucose-Capillary: 217 mg/dL — ABNORMAL HIGH (ref 70–99)

## 2021-06-09 LAB — RETICULOCYTES
Immature Retic Fract: 27.8 % — ABNORMAL HIGH (ref 2.3–15.9)
RBC.: 3.05 MIL/uL — ABNORMAL LOW (ref 3.87–5.11)
Retic Count, Absolute: 77.5 10*3/uL (ref 19.0–186.0)
Retic Ct Pct: 2.5 % (ref 0.4–3.1)

## 2021-06-09 LAB — VITAMIN B12: Vitamin B-12: 512 pg/mL (ref 180–914)

## 2021-06-09 LAB — FERRITIN: Ferritin: 207 ng/mL (ref 11–307)

## 2021-06-09 LAB — MAGNESIUM: Magnesium: 2.1 mg/dL (ref 1.7–2.4)

## 2021-06-09 MED ORDER — INSULIN ASPART 100 UNIT/ML IJ SOLN
14.0000 [IU] | Freq: Three times a day (TID) | INTRAMUSCULAR | Status: DC
  Administered 2021-06-10 – 2021-06-15 (×15): 14 [IU] via SUBCUTANEOUS

## 2021-06-09 MED ORDER — VANCOMYCIN HCL IN DEXTROSE 1-5 GM/200ML-% IV SOLN
1000.0000 mg | INTRAVENOUS | Status: DC
  Administered 2021-06-10 – 2021-06-11 (×2): 1000 mg via INTRAVENOUS
  Filled 2021-06-09 (×2): qty 200

## 2021-06-09 MED ORDER — PROPRANOLOL HCL 20 MG PO TABS
10.0000 mg | ORAL_TABLET | Freq: Three times a day (TID) | ORAL | Status: DC
  Administered 2021-06-09 – 2021-06-15 (×9): 10 mg via ORAL
  Filled 2021-06-09 (×16): qty 1

## 2021-06-09 MED ORDER — LACTATED RINGERS IV BOLUS
500.0000 mL | Freq: Once | INTRAVENOUS | Status: AC
  Administered 2021-06-09: 500 mL via INTRAVENOUS

## 2021-06-09 MED ORDER — VANCOMYCIN HCL 1500 MG/300ML IV SOLN
1500.0000 mg | Freq: Once | INTRAVENOUS | Status: AC
  Administered 2021-06-09: 1500 mg via INTRAVENOUS
  Filled 2021-06-09: qty 300

## 2021-06-09 NOTE — Progress Notes (Signed)
?PROGRESS NOTE ? ? ?Margaret Mcdaniel  YNW:[id number] DOB: December 11, 1950 DOA: 06/07/2021 ?PCP: [name], MD  ? ?Chief Complaint  ?Patient presents with  ? Hyperglycemia  ? ?Level of care: Telemetry ? ?Brief Admission History:  ?71 y.o. female with medical history significant for diabetes mellitus, Parkinson's disease, COPD respiratory failure on 2 L, CKD 3, breast cancer. ?  ?Patient was brought to the ED from home, reports of altered mental status and high blood sugar.  Patient's son found patient on the floor.  Patient's son talked to patient yesterday at about 73 in the morning and this was the last time patient was here was seen normal.  Today family called patient, patient did not appear to be normal on the phone, and her breathing was labored.  They went over to check on patient and saw patient on the floor, she got up on her knees but could not stand up.   EMS was called, blood sugar read high.  Patient was on room air, her O2 sats were 82%.  At the time of my evaluation, patient is altered, unable to provide any history.  Daughter and son at bedside, they report that in January patient blood sugars were low in the 40s, medications were gradually reduced and stopped.  She was on [place], Ozempic.  She is currently still on metformin. ?  ?ED Course: Temperature 97.7.  Tachycardic heart rate 190 117.  Respirate rate 18-32.  Blood pressure systolic 657Q to 469G.  O2 Sats 100% on 2 L.  Blood sugar 785, anion gap of 20, serum bicarb of 15.  Leukocytosis of 23.3.  Beta Hydroxybutyric acid 2.1. ?1.8 L bolus given, hospitalist to admit for DKA. ? ? ?06/08/2021 ?MRI reveals: Multiple small patchy acute cortical/subcortical infarcts within the right cerebral hemisphere affecting the posterior frontal lobe, parietal lobe, lateral occipital lobe and posterior temporal lobe.  These infarcts affect the posterior right MCA vascular territory, right MCA/PCA watershed territory and right MCA/ACA watershed territory.  ? ?Neurology  consultation requested.  Stroke orderset initiated. ?Pt transitioned off IV insulin to subcutaneous insulin.   ? ?06/09/2021:  Pt is going to need extensive rehabilitation.  PT/OT/SLP evaluations. Spoke with family who is agreeable to rehab.  Held off on CTA due to concerns about using contrast dye with poor renal function. Continue to hydrate today.  ? ?  ?Assessment and Plan: ?* DKA (diabetic ketoacidosis) ([place]) ?Blood sugars 785.  With anion gap of 20, serum bicarb of 15.  Beta hydroxybutyrate acid significantly elevated at 2.1.  Patient was previously on Tresiba, also 70/30 is On medication list.  Daughter reports her insulins medications were gradually titrated down and discontinued after episodes of hypoglycemia.  She is currently on metformin.  ?- Per care everywhere last visit 04/2021, patient was to continue Xultophy 40 units(insulin degludec and liraglutide) and metformin ?-Cpeptide low normal at 1.0  ?-Pt was treated with IV insulin EndoTool protocol ?-After acidosis corrected patient was transitioned to subcutaneous insulin basal bolus SSI regimen ?-continue frequent CBG monitoring ?- semglee 45 units, plus novolog 12 units TID with meals if eats 50% or more of meal, SSI, CBG 5 times per day ?CBG (last 3)  ?Recent Labs  ?  06/09/21 ?2952 06/09/21 ?0723 06/09/21 ?1140  ?GLUCAP 151* 189* 261*  ? ? ?Acute CVA (cerebrovascular accident) ([place]) ?--MRI confirms acute CVA.  Stroke orderset initiated ?--check TTE ?--follow up lipid panel (LDL<70) and A1c 10.3% ?--aspirin ordered for antiplatelet therapy  ?--neurology consultation requested ?--stroke  swallow screen requested  ?--I was able to discuss with son and DIL about need for rehabilitation ?--further recommendations per [name] (neurology) ? ?Gram-positive cocci bacteremia ?-- 2/2 bottles positive ?-- Pt has severe anaphylaxis reaction to cephalexin ?-- vancomycin per pharmacy  ? ?Leukocytosis ?WBC 23.3, chronically elevated, last check on file 3 to 4 years  ago, persistent leukocytosis ranging from 11-18..  Chest x-ray clear.  Afebrile.  At this time no focus of infection identified. ?-UA pending ?-Trend for now ?-May need to follow-up with hem/onc on discharge ? ?AKI (acute kidney injury) ([place]) ?AKI on CKD 3A-B.  Creatinine 1.8, baseline to 1.3. ?- we held CTA study to give time for AKI to improve prior to giving IV contrast dye ?- if neurology still feels CTA is still needed probably could do on 4/5. Otherwise could try to repeat MRA (1st one was poor quality study due to patient movement) ? ?Acute metabolic encephalopathy ?Acute metabolic encephalopathy with baseline Parkinson's disease.  Patient very confused, attempting to climb out of bed, staring off to the right side, appears to have left-sided neglect.  Moving all extremities unable to determine degree of strength or weakness if present.  Son talked to patient yesterday 4/1 at 73 in the morning.  Son found patient on the floor today. ?-Secondary to acute CVA ?- Slowly coming back around to baseline per family  ?- CVA treatment per neurology recommendations ? ?COPD (chronic obstructive pulmonary disease) ([place]) ?Stable. ?- Resume home regimen ? ?Secondary Parkinson disease ([place]) ?--resume home medications  ? ?Uncontrolled type 2 diabetes mellitus with hyperglycemia, with long-term current use of insulin ([place]) ?--follow up A1c ?--reports multiple treatment changes and adjustments recently per DM coordinator ?--with new CVA will need swallow eval to determine what type of diet is going to be required ? ?CBG (last 3)  ?Recent Labs  ?  06/08/21 ?0751 06/08/21 ?0829 06/08/21 ?1124  ?GLUCAP 189* 169* 149*  ? ? ? ?CKD (chronic kidney disease) stage 3, GFR 30-59 ml/min (HCC) ?--renally dose medications as needed ?--following renal function closely ?--currently hydrating with IVF as part of acute stroke treatment  ? ?DVT prophylaxis: SCDs ?Code Status: Full  ?Family Communication: son, DIL 4/3, 4/4  ?Disposition: Status  is: Inpatient ?Remains inpatient appropriate because: acute stroke treatment  ?  ?Consultants:  ?Neurology  ?Procedures:  ?MRI/MRA brain ?Antimicrobials:  ?  ?Subjective: ?Pt c/o of left side being weak and numb.   ?Objective: ?Vitals:  ? 06/08/21 2000 06/09/21 0115 06/09/21 8921 06/09/21 0747  ?BP: 133/68 126/65 (!) 89/56 (!) 113/55  ?Pulse: 73 70 64   ?Resp: '20 18 18   '$ ?Temp: 98.5 ?F (36.9 ?C) 98.4 ?F (36.9 ?C) 98.5 ?F (36.9 ?C)   ?TempSrc:  Oral    ?SpO2: 99% 100% 98%   ?Weight:      ?Height:      ? ? ?Intake/Output Summary (Last 24 hours) at 06/09/2021 1352 ?Last data filed at 06/09/2021 0900 ?Gross per 24 hour  ?Intake 1609.59 ml  ?Output 200 ml  ?Net 1409.59 ml  ? ?Filed Weights  ? 06/07/21 1942 06/08/21 0031 06/08/21 0300  ?Weight: 91 kg 83.8 kg 83.9 kg  ? ?Examination: ? ?General exam: pt much less confused, with occasional resting tremors seen.   ?Respiratory system: no increased work of breathing seen.  ?Cardiovascular system: normal S1 & S2 heard. No JVD, murmurs, rubs, gallops or clicks. No pedal edema. ?Gastrointestinal system: Abdomen is nondistended, soft and nontender. No organomegaly or masses felt. Normal  bowel sounds heard. ?Central nervous system: somnolent but arousable, right facial droop, mild left hemiparesis.  ?Extremities: dense left hemiparesis.  ?Skin: No rashes, lesions or ulcers. ?Psychiatry: Judgement and insight appear UTD. Mood & affect UTD.  ? ?Data Reviewed: I have personally reviewed following labs and imaging studies ? ?CBC: ?Recent Labs  ?Lab 06/07/21 ?2014 06/08/21 ?0426 06/09/21 ?5170  ?WBC 23.3* 24.0* 12.6*  ?NEUTROABS  --   --  7.1  ?HGB 10.3* 9.2* 7.7*  ?HCT 33.3* 29.3* 25.9*  ?MCV 79.3* 77.9* 83.3  ?PLT 606* 499* 333  ? ? ?Basic Metabolic Panel: ?Recent Labs  ?Lab 06/07/21 ?2014 06/07/21 ?2211 06/08/21 ?0205 06/08/21 ?0174 06/09/21 ?9449  ?NA 132* 137 141 140 141  ?K 4.8 4.6 4.1 3.8 3.7  ?CL 97* 103 108 109 112*  ?CO2 15* 16* 20* 21* 21*  ?GLUCOSE 785* 563* 189* 192* 191*   ?BUN 44* 42* 35* 32* 26*  ?CREATININE 1.82* 1.64* 1.35* 1.36* 1.19*  ?CALCIUM 8.8* 9.2 9.3 8.7* 8.1*  ?MG  --   --   --   --  2.1  ? ? ?CBG: ?Recent Labs  ?Lab 06/08/21 ?1733 06/08/21 ?2034 06/09/21 ?[place]

## 2021-06-09 NOTE — Evaluation (Signed)
Speech Language Pathology Evaluation ?Patient Details ?Name: Margaret Mcdaniel ?MRN: [id number] ?DOB: [birth year] ?Today's Date: 06/09/2021 ?Time: 5427-0623 ?SLP Time Calculation (min) (ACUTE ONLY): 50 min ? ?Problem List:  ?Patient Active Problem List  ? Diagnosis Date Noted  ? Acute CVA (cerebrovascular accident) ([place]) 06/08/2021  ? DKA (diabetic ketoacidosis) ([place]) 06/07/2021  ? Acute metabolic encephalopathy 76/28/3151  ? AKI (acute kidney injury) ([place]) 06/07/2021  ? Leukocytosis 06/07/2021  ? Malignant neoplasm of left female breast ([place])   ? Encephalopathy 08/25/2016  ? COPD (chronic obstructive pulmonary disease) ([place])   ? Secondary Parkinson disease ([place]) 06/29/2016  ? RLS (restless legs syndrome) 06/29/2016  ? Fever 06/21/2016  ? Pressure injury of skin 06/13/2016  ? Pyelonephritis 06/08/2016  ? Hypothyroidism 06/08/2016  ? Sepsis ([place]) 06/08/2016  ? CKD (chronic kidney disease) stage 3, GFR 30-59 ml/min (HCC) 06/08/2016  ? Uncontrolled type 2 diabetes mellitus with hyperglycemia, with long-term current use of insulin ([place]) 06/08/2016  ? Breast cancer metastasized to axillary lymph node, left ([place]) 04/14/2016  ? ?Past Medical History:  ?Past Medical History:  ?Diagnosis Date  ? Anxiety   ? Arthritis   ? Breast cancer metastasized to axillary lymph node, left ([place]) 04/14/2016  ? Cancer [facility])   ? left breast  ? CKD (chronic kidney disease) stage 3, GFR 30-59 ml/min (HCC) 06/08/2016  ? COPD (chronic obstructive pulmonary disease) ([place])   ? Diabetes mellitus without complication ([place])   ? GERD (gastroesophageal reflux disease)   ? History of kidney stones   ? Hypothyroidism   ? Mental disorder   ? Neuromuscular disorder ([place])   ? PONV (postoperative nausea and vomiting)   ? RLS (restless legs syndrome) 06/29/2016  ? Secondary Parkinson disease ([place]) 06/29/2016  ? Sleep apnea   ? Uses CPAP  ? Uncontrolled type 2 diabetes mellitus with hyperglycemia, with long-term current use of insulin ([place]) 06/08/2016  ? ?Past Surgical  History:  ?Past Surgical History:  ?Procedure Laterality Date  ? ANKLE SURGERY Right   ? fusion  ? APPENDECTOMY    ? CHOLECYSTECTOMY    ? EXPLORATORY LAPAROTOMY    ? HEMORRHOID SURGERY    ? MASTECTOMY MODIFIED RADICAL Left 04/14/2016  ? Procedure: LEFT MODIFIED RADICAL MASTECTOMY;  Surgeon: Aviva Signs, MD;  Location: [facility];  Service: General;  Laterality: Left;  ? PORT-A-CATH REMOVAL Right 12/19/2017  ? Procedure: MINOR REMOVAL PORT-A-CATH;  Surgeon: Aviva Signs, MD;  Location: [facility];  Service: General;  Laterality: Right;  ? PORTACATH PLACEMENT N/A 05/26/2016  ? Procedure: INSERTION PORT-A-CATH RIGHT SUBCLAVIAN AND  DRAINAGE OF SEROMA LEFT CHEST;  Surgeon: Aviva Signs, MD;  Location: [facility];  Service: General;  Laterality: N/A;  ? ROTATOR CUFF REPAIR    ? TONSILLECTOMY    ? TUBAL LIGATION    ? ?HPI:  ?Margaret Mcdaniel is a 71 y.o. female with past medical history of Parkinson's disease, diabetes, COPD with chronic respiratory failure on 3 L of oxygen, CKD level 3, breast cancer who was brought in on 06/07/2021 after being found down at home with high blood sugar (785)  Patient was diagnosed with diabetic ketoacidosis and was started on insulin.  Patient was also noted to be confused and therefore MRI brain was obtained which showed acute strokes. SLE requested  ? ?Assessment / Plan / Recommendation ?Clinical Impression ? Speech language evaluation completed indicating severe cognitive impairment and note mild dysarthria decreasing intellitibility. Pt is lethargic at the time of my exam requiring mod verbal cues to  remain alert and keep her eyes open which could negatively impact her score/presentation today. [facility] SLUMS Examination completed with Pt achieving a score of 8/30 (SEVERE). Pt is oriented and did recall some details from the brief story but all executive functioning tasks, thought organization tasks, calculations and clock drawing tasks were completed with 0% accuracy. With some subjective testing Pt  demonstrated ability to name objects with 65% accuracy however, note difficulty naming objects on the LEFT side and further note decreased awareness on the left side. She answered all biographical and factual y/n questions accurately. Pt is slow to respond and her processing time is significant, however there were moments when it seemed she was "zoned out" but would then provide the accurate response. Further note after talking at length with the family, the Pt had baseline cognitive imairments + secondary to Parkinsons, Pt demonstrated some delayed processing and lethargy (LOTS of sleeping at home) at baseline. It is difficult to objectively state what level of impairment is acute,  however, her cognitive/langugage/speech impairment is severe overall and she will require 24 hr assistance upon d/c. Recommend SNF to provide a more in depth cognitive assessment and provide ST therapy as indicated. ST Will continue to follow acutely. Thank you, ?   ?SLP Assessment ? SLP Recommendation/Assessment: Patient needs continued [place] [facility] ?SLP Visit Diagnosis: Dysarthria and anarthria (R47.1);Attention and concentration deficit;Cognitive communication deficit (R41.841) ?Attention and concentration deficit following: Cerebral infarction  ?  ?Recommendations for follow up therapy are one component of a multi-disciplinary discharge planning process, led by the attending physician.  Recommendations may be updated based on patient status, additional functional criteria and insurance authorization. ?   ?Follow Up Recommendations ? Skilled nursing-short term rehab (<3 hours/day)  ?  ?Assistance Recommended at Discharge ? Frequent or constant Supervision/Assistance  ?Functional Status Assessment    ?Frequency and Duration min 1 x/week  ?1 week ?  ?   ?SLP Evaluation ?Cognition ? Overall Cognitive Status: Impaired/Different from baseline ?Arousal/Alertness: Awake/alert ?Orientation Level: Oriented  X4 ?Attention: Focused ?Focused Attention: Impaired ?Memory: Impaired ?Memory Impairment: Decreased recall of new information ?Awareness: Impaired ?Awareness Impairment: Intellectual impairment;Emergent impairment;Anticipatory impairment ?Problem Solving: Impaired ?Problem Solving Impairment: Verbal basic ?Safety/Judgment: Impaired  ?  ?   ?Comprehension ? Auditory Comprehension ?Overall Auditory Comprehension: Appears within functional limits for tasks assessed ?Yes/No Questions: Within Functional Limits ?Conversation: Simple ?Reading Comprehension ?Reading Status: Not tested  ?  ?Expression Expression ?Primary Mode of Expression: Verbal ?Verbal Expression ?Overall Verbal Expression: Impaired ?Repetition: No impairment ?Naming: Impairment ?Responsive: 76-100% accurate ?Confrontation: Impaired ?Convergent: 75-100% accurate   ?Oral / Motor ? Oral Motor/Sensory Function ?Overall Oral Motor/Sensory Function: Within functional limits ?Motor Speech ?Overall Motor Speech: Impaired ?Articulation: Impaired ?Level of Impairment: Word ?Intelligibility: Intelligibility reduced ?Word: 75-100% accurate ?Phrase: 75-100% accurate ?Sentence: 75-100% accurate ?Conversation: 75-100% accurate ?Motor Planning: Witnin functional limits   ?        ?Tayden Nichelson H. Izora Ribas MA, CCC-SLP ?Speech Language Pathologist ? ?Wende Bushy ?06/09/2021, 11:21 AM ? ?

## 2021-06-09 NOTE — Progress Notes (Signed)
*  PRELIMINARY RESULTS* ?Echocardiogram ?2D Echocardiogram has been performed. ? ?Margaret Mcdaniel ?06/09/2021, 3:56 PM ?

## 2021-06-09 NOTE — Assessment & Plan Note (Signed)
--   2/2 bottles positive ?-- Pt has severe anaphylaxis reaction to cephalexin ?-- vancomycin per pharmacy  ?

## 2021-06-09 NOTE — Progress Notes (Signed)
Pharmacy Antibiotic Note ? ?Margaret Mcdaniel is a 71 y.o. female admitted on 06/07/2021 with bacteremia.  Pharmacy has been consulted for Vancomycin dosing. ? ?Plan: ?Vancomycin '1500mg'$  x 1 then '1000mg'$  IV Q 24 hrs. Goal AUC 400-550. ?Expected AUC: 472.6 ?SCr used: 1.19 ? ? ?Height: '5\' 4"'$  (162.6 cm) ?Weight: 83.9 kg (184 lb 15.5 oz) ?IBW/kg (Calculated) : 54.7 ? ?Temp (24hrs), Avg:98.5 ?F (36.9 ?C), Min:97.7 ?F (36.5 ?C), Max:99.4 ?F (37.4 ?C) ? ?Recent Labs  ?Lab 06/07/21 ?2014 06/07/21 ?2211 06/08/21 ?0205 06/08/21 ?0354 06/08/21 ?6568 06/09/21 ?1275  ?WBC 23.3*  --   --  24.0*  --  12.6*  ?CREATININE 1.82* 1.64* 1.35*  --  1.36* 1.19*  ?  ?Estimated Creatinine Clearance: 44.8 mL/min (A) (by C-G formula based on SCr of 1.19 mg/dL (H)).   ? ?Allergies  ?Allergen Reactions  ? Keflex [Cephalexin] Anaphylaxis  ?  Pt "codes" on this medication.  ? Cogentin [Benztropine] Other (See Comments)  ?  confusion  ? Sulfa Antibiotics Hives  ? Latex Rash  ? ? ?Antimicrobials this admission: ?Vancomycin 4/4 >>  ? ?Dose adjustments this admission: ? ? ?Microbiology results: ? BCx: pending ? UCx: pending  ? Sputum:   ? MRSA PCR:  ? ?Thank you for allowing pharmacy to be a part of this patient?s care. ? ?Hart Robinsons A ?06/09/2021 2:16 PM ? ?

## 2021-06-09 NOTE — Progress Notes (Signed)
Inpatient Diabetes Program Recommendations ? ?AACE/ADA: New Consensus Statement on Inpatient Glycemic Control  ? ?Target Ranges:  Prepandial:   less than 140 mg/dL ?     Peak postprandial:   less than 180 mg/dL (1-2 hours) ?     Critically ill patients:  140 - 180 mg/dL  ? ? Latest Reference Range & Units 06/09/21 02:40 06/09/21 07:23 06/09/21 11:40  ?Glucose-Capillary 70 - 99 mg/dL 151 (H) 189 (H) 261 (H)  ? ? Latest Reference Range & Units 06/08/21 08:29 06/08/21 11:24 06/08/21 17:33 06/08/21 20:34  ?Glucose-Capillary 70 - 99 mg/dL 169 (H) 149 (H) 176 (H) 123 (H)  ? ?Review of Glycemic Control ? ?Diabetes history: DM2 ?Outpatient Diabetes medications: Metformin XR 1000 mg daily, Tresiba 50 units QHS (not taking), 70/30 30 units BID  (not taking), Xultophy 40 units daily (prescribed per PCP note on 04/15/21 but not on med list) ?Current orders for Inpatient glycemic control: Semglee 45 units Q24H, Novolog 0-15 units TID with meals, Novolog 0-5 units QHS, Novolog 12 units TID with meals ?  ? ?NOTE: Patient admitted with DKA with initial glucose of 785 mg/dl on 06/07/21. Patient was started in IV insulin and transitioned to SQ insulin on 06/08/21.  Per H&P on 06/07/21 "Patient was previously on Tresiba, also 70/30 is On medication list.  Daughter reports her insulins medications were gradually titrated down and discontinued after episodes of hypoglycemia.  She is currently on metformin. Per care everywhere last visit 04/2021, patient was to continue Xultophy 40 units(insulin degludec and liraglutide) and metformin."   ? ?Spoke with patient at bedside. She is in bed sitting up with eyes open. Patient is able to tell me her name and that she is at he hospital. Patient reports that she manages her own DM and takes her DM medications herself. When asked about what DM medications she is taking, she stated she was taking Metformin and 70/30 mixed insulin. Patient reports that she is taking 70/30 65 units BID. Patient notes that her  DM medications were changed recently due to low sugars and again stated that she is now taking 70/30 65 units BID. Patient reports that she plans to return home after discharge from hospital and that she will continue to manage her DM herself. [place] and spoke with Sharyn Lull who reported that patient got 70/30 insulin pens filled in Feb 2023 and prescribed 35 units BID. They have Rx for Dorise Bullion, and Ozempic as well. Called [name] (patient's daughter in law) and she states that the patient was taking the Xultophy but the copay went up to $1492 so they were not able to get it filled. She states that pt was using the 70/30 most recently to keep glucose controled but since patient was having some lows patient would skip the 70/30 because of how she felt with low glucose levels. [place] reports that Ebony (provider at Dr. Juel Burrow office) is who they are working with and that Charlena Cross was aware of issue with cost of meds and was in the process of switching patient to [place]. [name] reports that the copay for the 70/30 insulin pens was $932. Informed [name] about Novolin 70/30 insulin pens at [facility] which are $43 per box of 5 insulin pens (as this may be much more affordable).  [name] reports that patient does manage her DM herself so she is unsure exactly what patient is or is not taking regarding the insulin (oral medications come bubble packaged so patient is taking Metformin  consistently). [name] reports that patient may need placement (awaiting PT evaluation) as family is not able to provide 24 hour care for patient. Informed [name] of current insulin regimen [name] states that she has just arrived at the hospital and on her way up to patient's room. She requested that I see if Dr. Wynetta Emery could come to patient's room to talk with her while she is here. Sent communication to Dr. Wynetta Emery regarding discussion with patient and [name]. ? ?Thanks, ?Barnie Alderman, RN, MSN, CDE ?Diabetes  Coordinator ?Inpatient Diabetes Program ?[phone] (Team Pager from 8am to 5pm) ? ? ?

## 2021-06-10 ENCOUNTER — Inpatient Hospital Stay (HOSPITAL_COMMUNITY): Payer: PPO

## 2021-06-10 DIAGNOSIS — G2111 Neuroleptic induced parkinsonism: Secondary | ICD-10-CM

## 2021-06-10 DIAGNOSIS — G9341 Metabolic encephalopathy: Secondary | ICD-10-CM | POA: Diagnosis not present

## 2021-06-10 DIAGNOSIS — I639 Cerebral infarction, unspecified: Secondary | ICD-10-CM | POA: Diagnosis not present

## 2021-06-10 DIAGNOSIS — N1832 Chronic kidney disease, stage 3b: Secondary | ICD-10-CM | POA: Diagnosis not present

## 2021-06-10 DIAGNOSIS — E1110 Type 2 diabetes mellitus with ketoacidosis without coma: Secondary | ICD-10-CM | POA: Diagnosis not present

## 2021-06-10 LAB — BASIC METABOLIC PANEL
Anion gap: 9 (ref 5–15)
BUN: 22 mg/dL (ref 8–23)
CO2: 21 mmol/L — ABNORMAL LOW (ref 22–32)
Calcium: 8.1 mg/dL — ABNORMAL LOW (ref 8.9–10.3)
Chloride: 105 mmol/L (ref 98–111)
Creatinine, Ser: 1.25 mg/dL — ABNORMAL HIGH (ref 0.44–1.00)
GFR, Estimated: 46 mL/min — ABNORMAL LOW (ref >60)
Glucose, Bld: 341 mg/dL — ABNORMAL HIGH (ref 70–99)
Potassium: 4.3 mmol/L (ref 3.5–5.1)
Sodium: 135 mmol/L (ref 135–145)

## 2021-06-10 LAB — CBC WITH DIFFERENTIAL/PLATELET
Abs Immature Granulocytes: 0.11 10*3/uL — ABNORMAL HIGH (ref 0.00–0.07)
Basophils Absolute: 0 10*3/uL (ref 0.0–0.1)
Basophils Relative: 0 %
Eosinophils Absolute: 0.2 10*3/uL (ref 0.0–0.5)
Eosinophils Relative: 1 %
HCT: 26.1 % — ABNORMAL LOW (ref 36.0–46.0)
Hemoglobin: 7.9 g/dL — ABNORMAL LOW (ref 12.0–15.0)
Immature Granulocytes: 1 %
Lymphocytes Relative: 21 %
Lymphs Abs: 2.8 10*3/uL (ref 0.7–4.0)
MCH: 25.6 pg — ABNORMAL LOW (ref 26.0–34.0)
MCHC: 30.3 g/dL (ref 30.0–36.0)
MCV: 84.7 fL (ref 80.0–100.0)
Monocytes Absolute: 1.3 10*3/uL — ABNORMAL HIGH (ref 0.1–1.0)
Monocytes Relative: 10 %
Neutro Abs: 9 10*3/uL — ABNORMAL HIGH (ref 1.7–7.7)
Neutrophils Relative %: 67 %
Platelets: 326 10*3/uL (ref 150–400)
RBC: 3.08 MIL/uL — ABNORMAL LOW (ref 3.87–5.11)
RDW: 17.3 % — ABNORMAL HIGH (ref 11.5–15.5)
WBC: 13.4 10*3/uL — ABNORMAL HIGH (ref 4.0–10.5)
nRBC: 0 % (ref 0.0–0.2)

## 2021-06-10 LAB — GLUCOSE, CAPILLARY
Glucose-Capillary: 303 mg/dL — ABNORMAL HIGH (ref 70–99)
Glucose-Capillary: 329 mg/dL — ABNORMAL HIGH (ref 70–99)
Glucose-Capillary: 346 mg/dL — ABNORMAL HIGH (ref 70–99)
Glucose-Capillary: 237 mg/dL — ABNORMAL HIGH (ref 70–99)
Glucose-Capillary: 237 mg/dL — ABNORMAL HIGH (ref 70–99)

## 2021-06-10 IMAGING — CT CT ANGIO NECK
2 of 7 series · 8 of 33 positions shown · non-contrast
Comparison: No prior CTA

CLINICAL DATA: Stroke, follow-up; small patchy acute infarcts on
MRI, carotid ultrasound without significantly elevated velocities.



[Series 4: cta neck · axial · 0.55mm/px · z∈[-160,-88]mm · 2 of 109 slices shown]
[im 37/109  soft-tissue]
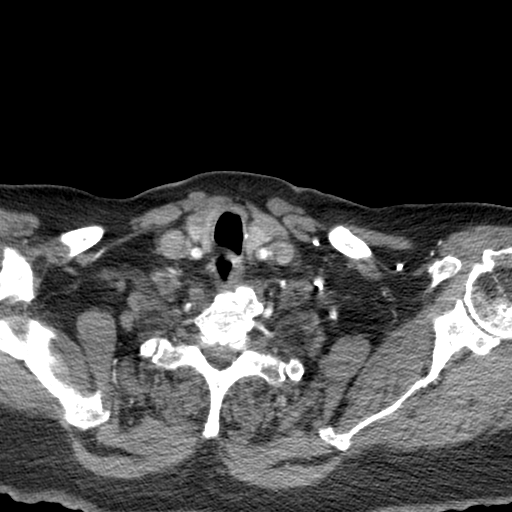
[im 73/109  soft-tissue]
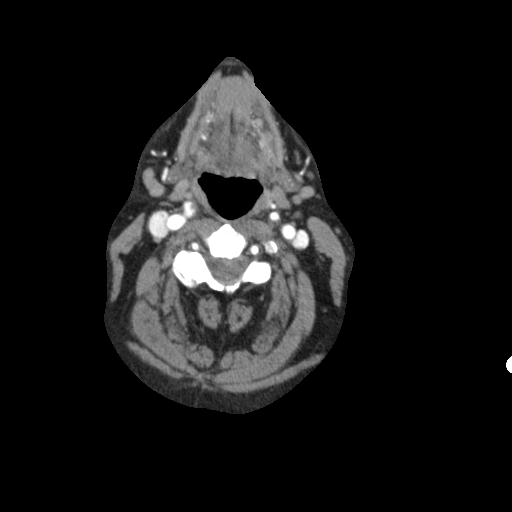

[Series 7: ax thin · axial · 0.39mm/px · z∈[-201,-47]mm · 6 of 216 slices shown]
[im 31/216  soft-tissue]
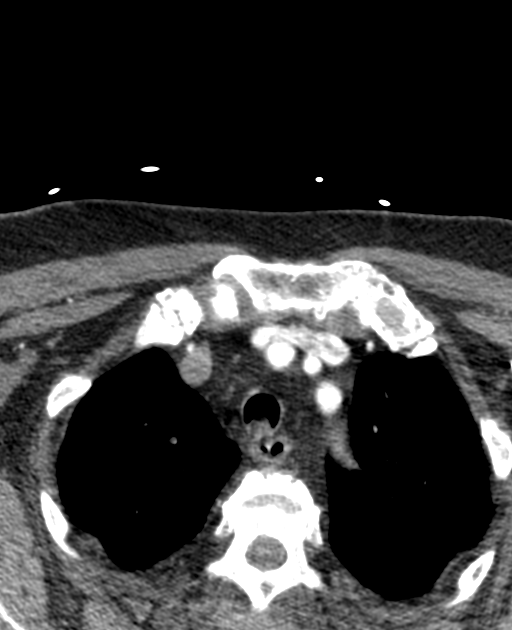
[im 62/216  bone]
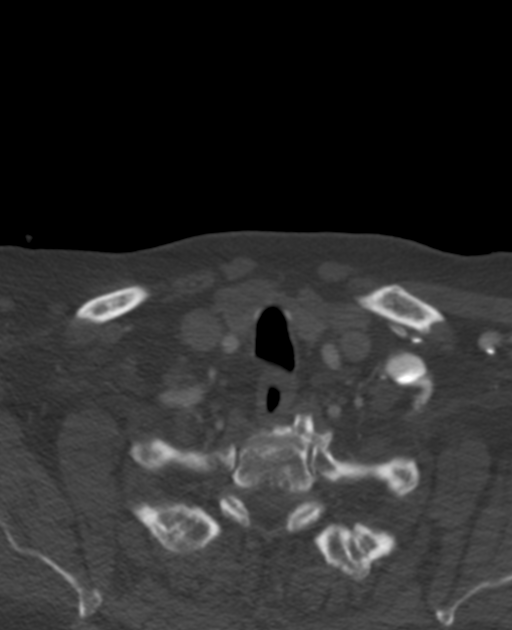
[im 93/216  soft-tissue]
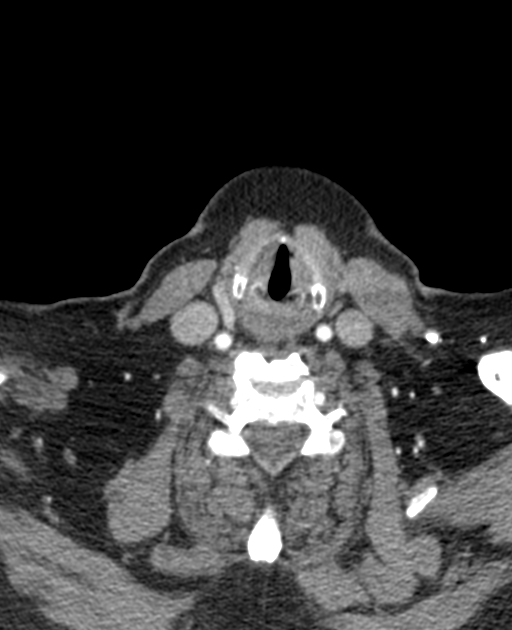
[im 123/216  bone]
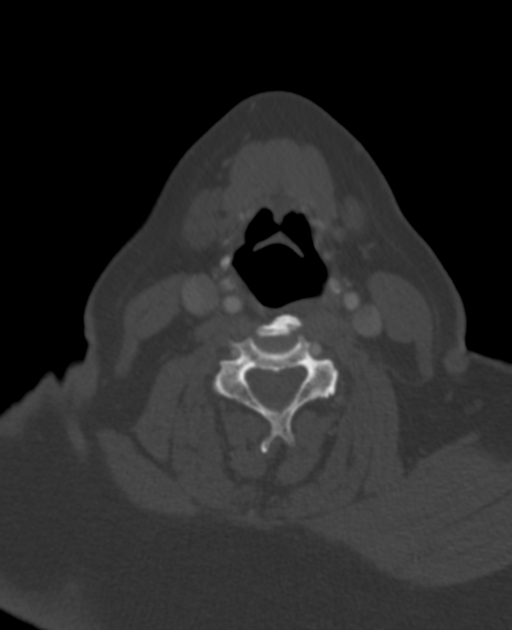
[im 154/216  soft-tissue]
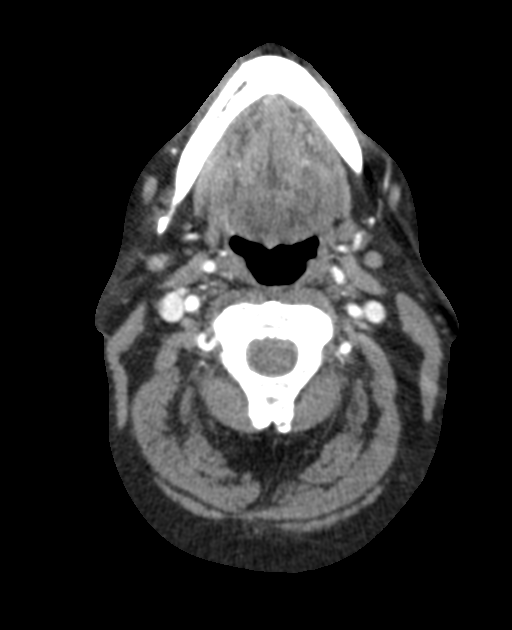
[im 185/216  bone]
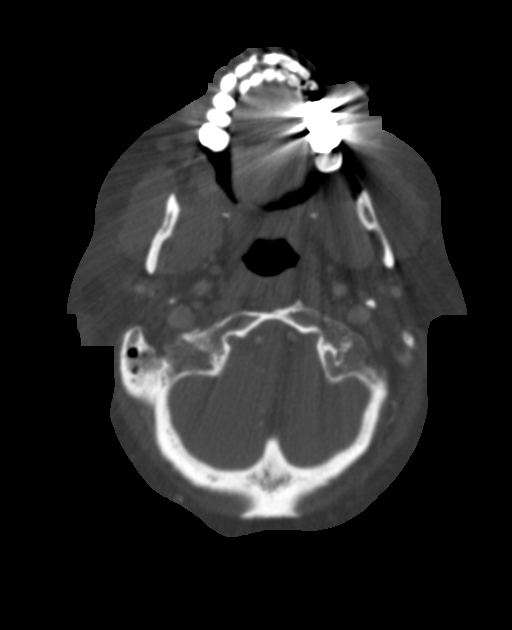

[8 of 33 positions shown; findings below may reference images not displayed]

RADIATION DOSE REDUCTION: This exam was performed according to the
departmental dose-optimization program which includes automated
exposure control, adjustment of the mA and/or kV according to
patient size and/or use of iterative reconstruction technique.

CONTRAST:  60mL OMNIPAQUE IOHEXOL 350 MG/ML SOLN
FINDINGS: Aortic arch: Standard branching. Imaged portion shows no evidence of
aneurysm or dissection. No significant stenosis of the major arch
vessel origins. Calcified and noncalcified plaque in the aortic
arch.

Right carotid system: Evaluation of the common carotid artery in
certain segments is limited due to motion. The majority of the right
common carotid artery is patent, until the distal portion, just
proximal to the bifurcation, where there is approximately 80%
luminal narrowing secondary to mural adherent thrombus (series 4,
image 40). This thrombus extends into the right ECA, with
approximately 60% stenosis (series 4, image 38). Calcified plaque
extends minimally into the proximal right ICA, without significant
stenosis.

Left carotid system: No evidence of dissection, stenosis (50% or
greater) or occlusion. Noncalcified plaque at the bifurcation is not
hemodynamically significant.

Vertebral arteries: Codominant. No evidence of dissection, stenosis
(50% or greater) or occlusion.

Skeleton: Sclerosis of the C5 (series 9, image 106) and T3 (series
9, image 102) vertebral bodies in particular, likely with also
involvement of T1, T2, and T4 (series 9, image 94). These are new
compared to the [DATE] CT. Additional sclerotic foci in the
right third rib. Degenerative changes in the cervical spine, with
large anterior osteophytes C3-C6.

Other neck: Negative.

Upper chest: No focal pulmonary opacity or pleural effusion.
IMPRESSION: 1. Mural adherent thrombus in the distal right common carotid
artery, extending into the right ECA, causing approximately 60%
stenosis of the right ECA. No significant stenosis of the right ICA.
2. No other hemodynamically significant stenosis in the neck.
3. Sclerosis of C5 and the visualized thoracic spine (T1-T4),
concerning for a neoplastic process, possibly metastatic breast
cancer given the patient's history. Additional sclerotic foci in the
right third rib. Consider bone scan or MRI of the cervical and
thoracic spine, if clinically indicated.

These results were called by telephone at the time of interpretation
on [DATE] at [DATE] to provider REMI , who verbally
acknowledged these results.

## 2021-06-10 MED ORDER — CLOPIDOGREL BISULFATE 75 MG PO TABS
75.0000 mg | ORAL_TABLET | Freq: Every day | ORAL | Status: DC
  Administered 2021-06-11 – 2021-06-12 (×2): 75 mg via ORAL
  Filled 2021-06-10 (×2): qty 1

## 2021-06-10 MED ORDER — ASPIRIN 325 MG PO TABS
325.0000 mg | ORAL_TABLET | Freq: Once | ORAL | Status: AC
  Administered 2021-06-10: 325 mg via ORAL
  Filled 2021-06-10: qty 1

## 2021-06-10 MED ORDER — ALBUTEROL SULFATE (2.5 MG/3ML) 0.083% IN NEBU
2.5000 mg | INHALATION_SOLUTION | Freq: Once | RESPIRATORY_TRACT | Status: AC
  Administered 2021-06-10: 2.5 mg via RESPIRATORY_TRACT
  Filled 2021-06-10: qty 3

## 2021-06-10 MED ORDER — ATORVASTATIN CALCIUM 40 MG PO TABS
40.0000 mg | ORAL_TABLET | Freq: Every day | ORAL | Status: DC
  Administered 2021-06-10 – 2021-06-14 (×5): 40 mg via ORAL
  Filled 2021-06-10 (×5): qty 1

## 2021-06-10 MED ORDER — IOHEXOL 350 MG/ML SOLN
75.0000 mL | Freq: Once | INTRAVENOUS | Status: AC | PRN
Start: 2021-06-10 — End: 2021-06-10
  Administered 2021-06-10: 60 mL via INTRAVENOUS

## 2021-06-10 MED ORDER — CLOPIDOGREL BISULFATE 75 MG PO TABS
300.0000 mg | ORAL_TABLET | Freq: Once | ORAL | Status: AC
  Administered 2021-06-10: 300 mg via ORAL
  Filled 2021-06-10: qty 4

## 2021-06-10 MED ORDER — LACTATED RINGERS IV SOLN
INTRAVENOUS | Status: AC
Start: 2021-06-10 — End: 2021-06-11

## 2021-06-10 NOTE — Progress Notes (Signed)
Inpatient Diabetes Program Recommendations ? ?AACE/ADA: New Consensus Statement on Inpatient Glycemic Control  ?Target Ranges:  Prepandial:   less than 140 mg/dL ?     Peak postprandial:   less than 180 mg/dL (1-2 hours) ?     Critically ill patients:  140 - 180 mg/dL  ? ? Latest Reference Range & Units 06/09/21 07:23 06/09/21 11:40 06/09/21 17:07 06/09/21 20:59 06/10/21 03:06  ?Glucose-Capillary 70 - 99 mg/dL 189 (H) 261 (H) 229 (H) 217 (H) 303 (H)  ? ?Review of Glycemic Control ? ?Diabetes history: DM2 ?Outpatient Diabetes medications: Metformin XR 1000 mg daily, 70/30 35 units BID  ?Current orders for Inpatient glycemic control: Semglee 45 units Q24H, Novolog 0-15 units TID with meals, Novolog 0-5 units QHS, Novolog 14 units TID with meals ?  ?Inpatient Diabetes Program Recommendations:   ? ?Insulin: Fasting glucose 303 mg/dl this morning. Please consider increasing Semglee to 55 units Q24H and increase meal coverage to Novolog 18 units TID. ? ?Thanks, ?Barnie Alderman, RN, MSN, CDE ?Diabetes Coordinator ?Inpatient Diabetes Program ?[phone] (Team Pager from 8am to 5pm) ? ?

## 2021-06-10 NOTE — Evaluation (Signed)
Occupational Therapy Evaluation ?Patient Details ?Name: Margaret Mcdaniel ?MRN: [id number] ?DOB: 03/13/1950 ?Today's Date: 06/10/2021 ? ? ?History of Present Illness [name] is a 71 y.o. female with medical history significant for diabetes mellitus, Parkinson's disease, COPD respiratory failure on 2 L, CKD 3, breast cancer.     Patient was brought to the ED from home, reports of altered mental status and high blood sugar.  Patient's son found patient on the floor.  Patient's son talked to patient yesterday at about 11 in the morning and this was the last time patient was here was seen normal.  Today family called patient, patient did not appear to be normal on the phone, and her breathing was labored.  They went over to check on patient and saw patient on the floor, she got up on her knees but could not stand up.    EMS was called, blood sugar read high.  Patient was on room air, her O2 sats were 82%.  At the time of my evaluation, patient is altered, unable to provide any history.  Daughter and son at bedside, they report that in January patient blood sugars were low in the 40s, medications were gradually reduced and stopped.  She was on [place], Ozempic.  She is currently still on metformin. (per MD note)  ? ?Clinical Impression ?  ?Pt agreeable to OT and PT co-evaluation. Pt lives alone at baseline with help only for IADL's such as getting groceries. Pt presents with limited movement of L UE and LE this date. Pt able to grip RW with assist with no other functional use of L UE against gravity. Pt also presents with L side visual field deficit. Pt requires mod to max A for bed mobility and functional transfers due to decreased postural stability, balance, and functional use of L UE and LE. Pt required max A to don socks supine in bed. Pt was left in chair with chair alarm on and call bell within reach. Pt will benefit from continued OT in the hospital and recommended venue below to increase strength, balance, and  endurance for safe ADL's.  ? ? ?   ? ?Recommendations for follow up therapy are one component of a multi-disciplinary discharge planning process, led by the attending physician.  Recommendations may be updated based on patient status, additional functional criteria and insurance authorization.  ? ?Follow Up Recommendations ? Acute inpatient rehab (3hours/day)  ?  ?Assistance Recommended at Discharge Frequent or constant Supervision/Assistance  ?Patient can return home with the following A little help with walking and/or transfers;A lot of help with bathing/dressing/bathroom;Assistance with cooking/housework;Assistance with feeding;Assist for transportation;Help with stairs or ramp for entrance;Direct supervision/assist for medications management ? ?  ?Functional Status Assessment ? Patient has had a recent decline in their functional status and demonstrates the ability to make significant improvements in function in a reasonable and predictable amount of time.  ?Equipment Recommendations ? None recommended by OT  ?  ?Recommendations for Other Services   ? ? ?  ?Precautions / Restrictions Precautions ?Precautions: Fall ?Restrictions ?Weight Bearing Restrictions: No  ? ?  ? ?Mobility Bed Mobility ?Overal bed mobility: Needs Assistance ?Bed Mobility: Supine to Sit ?  ?  ?Supine to sit: Max assist, Mod assist ?  ?  ?General bed mobility comments: Labored movement with poor balance. Needing physical assist to move L LE and push to sit. ?  ? ?Transfers ?Overall transfer level: Needs assistance ?  ?Transfers: Sit to/from Stand, Bed to chair/wheelchair/BSC ?Sit  to Stand: Mod assist ?Stand pivot transfers: Mod assist, Max assist ?  ?  ?  ?  ?General transfer comment: Better sit to stand from chair with mod A. Mod to max A for stand pivot with RW with assist to move L LE and maintain grip on RW with L UE. ?  ? ?  ?Balance Overall balance assessment: Needs assistance ?Sitting-balance support: Feet supported, Single extremity  supported ?Sitting balance-Leahy Scale: Poor ?Sitting balance - Comments: poor to fair seated EOB ?  ?Standing balance support: Bilateral upper extremity supported, During functional activity, Reliant on assistive device for balance ?Standing balance-Leahy Scale: Poor ?Standing balance comment: using RW ?  ?  ?  ?  ?  ?  ?  ?  ?  ?  ?  ?   ? ?ADL either performed or assessed with clinical judgement  ? ?ADL Overall ADL's : Needs assistance/impaired ?Eating/Feeding: Set up;Sitting ?Eating/Feeding Details (indicate cue type and reason): Pt feeding seated in chair using R hand only. ?Grooming: Sitting;Minimal assistance;Moderate assistance ?  ?Upper Body Bathing: Moderate assistance;Sitting ?  ?Lower Body Bathing: Total assistance;Maximal assistance;Bed level ?  ?Upper Body Dressing : Moderate assistance;Sitting ?  ?Lower Body Dressing: Maximal assistance;Total assistance;Bed level ?  ?Toilet Transfer: Maximal assistance;Rolling walker (2 wheels);Stand-pivot ?Toilet Transfer Details (indicate cue type and reason): Simulated via EOB to chair transfer with RW ?Toileting- Clothing Manipulation and Hygiene: Maximal assistance;Total assistance;Bed level ?  ?Tub/ Shower Transfer: Maximal assistance;Total assistance;Rolling walker (2 wheels);Stand-pivot;Tub bench ?  ?Functional mobility during ADLs: Maximal assistance;+2 for physical assistance;Rolling walker (2 wheels) ?General ADL Comments: Pt limited by mostly flaccid L UE and limited mobility in L LE. Sitting balance poor at this time as well.  ? ? ? ?Vision Baseline Vision/History: 0 No visual deficits ?Ability to See in Adequate Light: 0 Adequate ?Patient Visual Report: No change from baseline ?Vision Assessment?: Yes ?Eye Alignment: Impaired (comment) (Mild R lateral strabismus.) ?Tracking/Visual Pursuits: Decreased smoothness of eye movement to LEFT inferior field;Decreased smoothness of eye movement to LEFT superior field ?Visual Fields: Left visual field deficit;Left  homonymous hemianopsia ?Additional Comments: Limited L field of view per assessment while seated in chair.  ?   ?   ?  ?   ?  ? ?Pertinent Vitals/Pain Pain Assessment ?Pain Assessment: No/denies pain  ? ? ? ?Hand Dominance Right ?  ?Extremity/Trunk Assessment Upper Extremity Assessment ?Upper Extremity Assessment: RUE deficits/detail;LUE deficits/detail ?RUE Deficits / Details: Generally weak ?RUE Sensation: WNL ?RUE Coordination: WNL ?LUE Deficits / Details: 0/5 shoulder flexion; 2/5 elbow flexion; 0/5 wrist extension; 2-/5 wrist flexion; 3+/5 grips. No reports of numbness or tingling. WFL P/ROM. Flaccid mosly other than elbow range with gravitiy eliminated and grip strength. Noted to have compose extenions of digits. ?LUE Sensation: WNL (Per report.) ?LUE Coordination: decreased fine motor;decreased gross motor ?  ?Lower Extremity Assessment ?Lower Extremity Assessment: Defer to PT evaluation ?  ?Cervical / Trunk Assessment ?Cervical / Trunk Assessment: Normal ?  ?Communication Communication ?Communication: Expressive difficulties (Mild slurred speech at times.) ?  ?Cognition Arousal/Alertness: Awake/alert ?Behavior During Therapy: [facility] for tasks assessed/performed ?Overall Cognitive Status: No family/caregiver present to determine baseline cognitive functioning ?  ?  ?  ?  ?  ?  ?  ?  ?  ?  ?  ?  ?  ?  ?  ?  ?General Comments: Mild difficulty following commands but could be more of a praxis issue. ?  ?  ?   ? ?  ?   ?  ?    ? ? ?  Home Living Family/patient expects to be discharged to:: Private residence ?Living Arrangements: Alone ?Available Help at Discharge: Family ?Type of Home: House ?Home Access: Ramped entrance ?  ?  ?Home Layout: One level ?  ?  ?Bathroom Shower/Tub: Tub/shower unit ?  ?Bathroom Toilet: Handicapped height ?  ?  ?Home Equipment: Shower seat;Grab bars - tub/shower ?  ?  ? Lives With: Alone ? ?  ?Prior Functioning/Environment Prior Level of Function : Needs assist ?  ?  ?  ?Physical Assist :  ADLs (physical) ?  ?ADLs (physical): IADLs ?Mobility Comments: Houshold ambulator without AD ?ADLs Comments: Indepndent for ADL's with assist for getting groceries. ?  ? ?  ?  ?OT Problem List: Decrease

## 2021-06-10 NOTE — TOC Progression Note (Signed)
Transition of Care (TOC) - Progression Note  ? ? ?Patient Details  ?Name: Margaret Mcdaniel ?MRN: [id number] ?Date of Birth: 01-17-1951 ? ?Transition of Care (TOC) CM/SW Contact  ?Shade Flood, LCSW ?Phone Number: ?06/10/2021, 12:48 PM ? ?Clinical Narrative:    ? ?TOC following. PT/OT recommending CIR at dc. Spoke with pt's DIL, [name], today to update. Discussed CIR and referral process. [name] states that they are interested in pt going to CIR if accepted and approved by insurance.  ? ?TOC will follow and continue to assist as needed. ? ?Expected Discharge Plan: [place] ?Barriers to Discharge: Continued Medical Work up ? ?Expected Discharge Plan and Services ?Expected Discharge Plan: [place] ?In-house Referral: Clinical Social Work ?  ?  ?Living arrangements for the past 2 months: [place] ?                ?  ?  ?  ?  ?  ?  ?  ?  ?  ?  ? ? ?Social Determinants of Health (SDOH) Interventions ?  ? ?Readmission Risk Interventions ? ?  06/08/2021  ? 11:26 AM  ?Readmission Risk Prevention Plan  ?Transportation Screening Complete  ?[place] or Home Care Consult Complete  ?Social Work Consult for [place] Planning/Counseling Complete  ?Palliative Care Screening Not Applicable  ?Medication Review [employer] photographer) Complete  ? ? ?

## 2021-06-10 NOTE — Evaluation (Signed)
Physical Therapy Evaluation ?Patient Details ?Name: Margaret Mcdaniel ?MRN: [id number] ?DOB: Aug 18, 1950 ?Today's Date: 06/10/2021 ? ?History of Present Illness ? Margaret Mcdaniel is a 71 y.o. female with medical history significant for diabetes mellitus, Parkinson's disease, COPD respiratory failure on 2 L, CKD 3, breast cancer.     Patient was brought to the ED from home, reports of altered mental status and high blood sugar.  Patient's son found patient on the floor.  Patient's son talked to patient yesterday at about 11 in the morning and this was the last time patient was here was seen normal.  Today family called patient, patient did not appear to be normal on the phone, and her breathing was labored.  They went over to check on patient and saw patient on the floor, she got up on her knees but could not stand up.    EMS was called, blood sugar read high.  Patient was on room air, her O2 sats were 82%.  At the time of my evaluation, patient is altered, unable to provide any history.  Daughter and son at bedside, they report that in January patient blood sugars were low in the 40s, medications were gradually reduced and stopped.  She was on [place], Ozempic.  She is currently still on metformin. ?  ?Clinical Impression ? Patient demonstrates slow labored movement for sitting up at bedside with limited use of left side due to weakness, once seated frequent leaning backwards, but able to keep trunk in midline after verbal/tactile cueing, very unsteady on feet and unable to advance LLE due to weakness requiring Max tactile assist to slide/shuffle left foot when taking side steps to transfer to chair.  Patient unable to dorsiflex left ankle or extend left knee against gravity due to weakness.  Patient tolerated sitting up in chair after therapy - nursing staff aware.  Patient will benefit from continued skilled physical therapy in hospital and recommended venue below to increase strength, balance, endurance for safe ADLs and  gait.  ? ?   ?   ? ?Recommendations for follow up therapy are one component of a multi-disciplinary discharge planning process, led by the attending physician.  Recommendations may be updated based on patient status, additional functional criteria and insurance authorization. ? ?Follow Up Recommendations Acute inpatient rehab (3hours/day) ? ?  ?Assistance Recommended at Discharge Intermittent Supervision/Assistance  ?Patient can return home with the following ? A lot of help with bathing/dressing/bathroom;A lot of help with walking and/or transfers;Help with stairs or ramp for entrance;Assistance with cooking/housework;Assist for transportation ? ?  ?Equipment Recommendations Rolling walker (2 wheels);None recommended by PT  ?Recommendations for Other Services ?    ?  ?Functional Status Assessment Patient has had a recent decline in their functional status and demonstrates the ability to make significant improvements in function in a reasonable and predictable amount of time.  ? ?  ?Precautions / Restrictions Precautions ?Precautions: Fall ?Restrictions ?Weight Bearing Restrictions: No  ? ?  ? ?Mobility ? Bed Mobility ?Overal bed mobility: Needs Assistance ?Bed Mobility: Supine to Sit ?  ?  ?Supine to sit: Max assist, Mod assist ?  ?  ?General bed mobility comments: as per OT notes ?  ? ?Transfers ?Overall transfer level: Needs assistance ?Equipment used: Rolling walker (2 wheels) ?Transfers: Sit to/from Stand, Bed to chair/wheelchair/BSC ?Sit to Stand: Mod assist ?Stand pivot transfers: Mod assist, Max assist ?  ?  ?  ?  ?General transfer comment: as per OT notes ?  ? ?Ambulation/Gait ?  Ambulation/Gait assistance: Max assist ?Gait Distance (Feet): 4 Feet ?Assistive device: Rolling walker (2 wheels) ?Gait Pattern/deviations: Decreased step length - right, Decreased step length - left, Decreased stance time - left, Decreased stride length, Decreased dorsiflexion - left, Shuffle ?Gait velocity: slow ?  ?  ?General Gait  Details: demonstrates slow labored movement requiring Max tactile assist to slide/shuffle left foot when taking side steps, very unsteady with near loss of balance and limited to a few side steps before having to sit due to weakness/fatigue ? ?Stairs ?  ?  ?  ?  ?  ? ?Wheelchair Mobility ?  ? ?Modified Rankin (Stroke Patients Only) ?  ? ?  ? ?Balance Overall balance assessment: Needs assistance ?Sitting-balance support: Feet supported, No upper extremity supported ?Sitting balance-Leahy Scale: Poor ?Sitting balance - Comments: fair/poor seated at EOB ?  ?Standing balance support: Bilateral upper extremity supported, During functional activity, Reliant on assistive device for balance ?Standing balance-Leahy Scale: Poor ?Standing balance comment: using RW ?  ?  ?  ?  ?  ?  ?  ?  ?  ?  ?  ?   ? ? ? ?Pertinent Vitals/Pain Pain Assessment ?Pain Assessment: No/denies pain  ? ? ?Home Living Family/patient expects to be discharged to:: Private residence ?Living Arrangements: Alone ?Available Help at Discharge: Family ?Type of Home: House ?Home Access: Ramped entrance ?  ?  ?  ?Home Layout: One level ?Home Equipment: Shower seat;Grab bars - tub/shower ?   ?  ?Prior Function Prior Level of Function : Needs assist ?  ?  ?  ?Physical Assist : ADLs (physical);Mobility (physical) ?Mobility (physical): Bed mobility;Transfers;Stairs;Gait ?ADLs (physical): IADLs ?Mobility Comments: [place]hold ambulator without AD ?ADLs Comments: Indepndent for ADL's with assist for getting groceries. ?  ? ? ?Hand Dominance  ? Dominant Hand: Right ? ?  ?Extremity/Trunk Assessment  ? Upper Extremity Assessment ?Upper Extremity Assessment: Defer to OT evaluation ?RUE Deficits / Details: Generally weak ?RUE Sensation: WNL ?RUE Coordination: WNL ?LUE Deficits / Details: 0/5 shoulder flexion; 2/5 elbow flexion; 0/5 wrist extension; 2-/5 wrist flexion; 3+/5 grips. No reports of numbness or tingling. WFL P/ROM. Flaccid mosly other than elbow range with  gravitiy eliminated and grip strength. Noted to have compose extenions of digits. ?LUE Sensation: WNL (Per report.) ?LUE Coordination: decreased fine motor;decreased gross motor ?  ? ?Lower Extremity Assessment ?Lower Extremity Assessment: Generalized weakness;RLE deficits/detail;LLE deficits/detail ?RLE Deficits / Details: grossly 4/5 ?RLE Sensation: WNL ?RLE Coordination: WNL ?LLE Deficits / Details: grossly -3/5 ?LLE Sensation: decreased light touch;decreased proprioception ?LLE Coordination: WNL;decreased gross motor ?  ? ?Cervical / Trunk Assessment ?Cervical / Trunk Assessment: Normal  ?Communication  ? Communication: Expressive difficulties  ?Cognition Arousal/Alertness: Awake/alert ?Behavior During Therapy: [facility] for tasks assessed/performed ?Overall Cognitive Status: No family/caregiver present to determine baseline cognitive functioning ?  ?  ?  ?  ?  ?  ?  ?  ?  ?  ?  ?  ?  ?  ?  ?  ?  ?  ?  ? ?  ?General Comments   ? ?  ?Exercises    ? ?Assessment/Plan  ?  ?PT Assessment Patient needs continued PT services  ?PT Problem List Decreased strength;Decreased activity tolerance;Decreased balance;Decreased mobility ? ?   ?  ?PT Treatment Interventions DME instruction;Gait training;Stair training;Functional mobility training;Therapeutic activities;Therapeutic exercise;Balance training;Patient/family education;Neuromuscular re-education   ? ?PT Goals (Current goals can be found in the Care Plan section)  ?Acute Rehab PT Goals ?Patient Stated Goal: return  home after rehab ?PT Goal Formulation: With patient ?Time For Goal Achievement: 06/24/21 ?Potential to Achieve Goals: Good ? ?  ?Frequency Min 5X/week ?  ? ? ?Co-evaluation PT/OT/SLP Co-Evaluation/Treatment: Yes ?Reason for Co-Treatment: Complexity of the patient's impairments (multi-system involvement);To address functional/ADL transfers ?PT goals addressed during session: Mobility/safety with mobility;Balance;Proper use of DME ?OT goals addressed during session:  ADL's and self-care ?  ? ? ?  ?AM-PAC PT "6 Clicks" Mobility  ?Outcome Measure Help needed turning from your back to your side while in a flat bed without using bedrails?: A Lot ?Help needed moving from ly

## 2021-06-10 NOTE — Progress Notes (Signed)
?PROGRESS NOTE ? ? ?Margaret Mcdaniel  JXB:[id number] DOB: 09-20-1950 DOA: 06/07/2021 ?PCP: [name], MD  ? ?Chief Complaint  ?Patient presents with  ? Hyperglycemia  ? ?Level of care: Telemetry ? ?Brief Admission History:  ?71 y.o. female with medical history significant for diabetes mellitus, Parkinson's disease, COPD respiratory failure on 2 L, CKD 3, breast cancer. ?  ?Patient was brought to the ED from home, reports of altered mental status and high blood sugar.  Patient's son found patient on the floor.  Patient's son talked to patient yesterday at about 11 in the morning and this was the last time patient was here was seen normal.  Today family called patient, patient did not appear to be normal on the phone, and her breathing was labored.  They went over to check on patient and saw patient on the floor, she got up on her knees but could not stand up.   EMS was called, blood sugar read high.  Patient was on room air, her O2 sats were 82%.  At the time of my evaluation, patient is altered, unable to provide any history.  Daughter and son at bedside, they report that in January patient blood sugars were low in the 40s, medications were gradually reduced and stopped.  She was on [place], Ozempic.  She is currently still on metformin. ?  ?ED Course: Temperature 97.7.  Tachycardic heart rate 190 117.  Respirate rate 18-32.  Blood pressure systolic 130Q to 657Q.  O2 Sats 100% on 2 L.  Blood sugar 785, anion gap of 20, serum bicarb of 15.  Leukocytosis of 23.3.  Beta Hydroxybutyric acid 2.1. ?1.8 L bolus given, hospitalist to admit for DKA. ? ? ?06/08/2021 ?MRI reveals: Multiple small patchy acute cortical/subcortical infarcts within the right cerebral hemisphere affecting the posterior frontal lobe, parietal lobe, lateral occipital lobe and posterior temporal lobe.  These infarcts affect the posterior right MCA vascular territory, right MCA/PCA watershed territory and right MCA/ACA watershed territory.  ? ?Neurology  consultation requested.  Stroke orderset initiated. ?Pt transitioned off IV insulin to subcutaneous insulin.   ? ?06/09/2021:  Pt is going to need extensive rehabilitation.  PT/OT/SLP evaluations. Spoke with family who is agreeable to rehab.  Held off on CTA due to concerns about using contrast dye with poor renal function. Continue to hydrate today.  ? ?  ?Assessment and Plan: ? ?-Acute stroke--multiple small patchy acute cortical/subcortical infarcts within the right cerebellar hemisphere ?CTA neck from 06/10/2021 shows-Mural adherent thrombus in the distal right common carotid artery, extending into the right ECA, causing approximately 60% stenosis of the right ECA. No significant stenosis of the right ICA. ?--Above discussed with [name] on 06/10/2021 recommends ?-Official neuro reconsult requested ?-Load with aspirin 325 mg x 1, load with Plavix 300 mg x 1 then ?-Starting 06/11/2021 aspirin 81 mg daily along with Plavix 75 mg daily for 90 days then after that STOP the Plavix  and continue ONLY Aspirin 81 mg daily indefinitely--for secondary stroke Prevention (Per The multicenter SAMMPRIS trial) ?-PT /OT eval appreciated recommends CIR ?-LDL 67, HDL 29 total cholesterol 135--give Lipitor 40 mg daily... Even if her lipid panel is within desired limits, patient should still take Lipitor/Statin for it's Pleiotropic effects (beyond cholesterol lowering benefits) ?-Echo with EF of 70 to 65% and grade 1 diastolic dysfunction with normal pulmonary artery pressures ? ? ?Possible metastatic breast cancer-CTA neck from 06/10/2021 showed Sclerosis of C5 and the visualized thoracic spine (T1-T4), concerning for a neoplastic  process, possibly metastatic breast ?cancer given the patient's history. Additional sclerotic foci in the ?right third rib. Consider bone scan or MRI of the cervical and ?thoracic spine or -May also need PET scan and oncology follow-up as outpatient after resolution of acute neuro issues ? ?* DKA (diabetic  ketoacidosis)  ?Patient met criteria for DKA on admission ?-DKA pathophysiology resolved patient has been transitioned off IV insulin back to subcu insulin at this time ?-A1c 10.3 reflecting uncontrolled DM with hyperglycemia PTA ? ? ?Leukocytosis ?WBC 23.3, chronically elevated, last check on file 3 to 4 years ago, persistent leukocytosis ranging from 11-18..  Chest x-ray clear.  Afebrile.  At this time no focus of infection identified. ?-May need to follow-up with hem/onc on discharge ? ?Acute metabolic encephalopathy ?Acute metabolic encephalopathy with baseline Parkinson's disease.  With superimposed acute stroke ?-Mentation has improved significantly ? ?Staph epi bacteremia ?-- Blood cultures from 06/08/2021 2/2 bottles positive ?-- Pt has severe anaphylaxis reaction to cephalexin ?-- vancomycin per pharmacy  ?-This may be a contaminant will repeat blood cultures ? ?AKI (acute kidney injury) ([place]) on CKD 3B ?AKI on CKD 3A-B.  Creatinine 1.8, baseline to 1.3. ?-Creatinine down to 1.25 with hydration ?-Patient had CTA neck on 06/10/2021--- monitor renal function ? ?COPD (chronic obstructive pulmonary disease) ([place]) ?Stable. ?Continue current regimen ? ?Secondary Parkinson disease ([place]) ?--resume home medications  ? ?DVT prophylaxis: SCDs ?Code Status: Full  ?Family Communication: son, DIL 4/3, 4/4  ?Disposition: Status is: Inpatient ?Remains inpatient appropriate because: acute stroke treatment  ?  ?Consultants:  ?Neurology  ?Procedures:  ?MRI/MRA brain ?CTA neck ?Antimicrobials:  ?  ?Subjective: ?Persistent dense left-sided hemiplegia ?-Speech and swallow improving ?-Mentation improving ? ?Objective: ?Vitals:  ? 06/09/21 2056 06/10/21 0126 06/10/21 0308 06/10/21 1410  ?BP: (!) 135/57  (!) 108/58 140/69  ?Pulse: 76  65 66  ?Resp: 20  18 (!) 24  ?Temp: 99.5 ?F (37.5 ?C)  97.6 ?F (36.4 ?C) 98.1 ?F (36.7 ?C)  ?TempSrc: Oral   Oral  ?SpO2: 99% 99% 100% 98%  ?Weight:      ?Height:      ? ?No intake or output data in the  24 hours ending 06/10/21 1734 ? ?Filed Weights  ? 06/07/21 1942 06/08/21 0031 06/08/21 0300  ?Weight: 91 kg 83.8 kg 83.9 kg  ? ?Examination: ? ? ?Physical Exam ? ?Gen:- Awake Alert, in no acute distress  ?HEENT:- [place].AT, No sclera icterus ?Neck-Supple Neck,No JVD,.  ?Lungs-  CTAB , fair air movement bilaterally  ?CV- S1, S2 normal, RRR ?Abd-  +ve B.Sounds, Abd Soft, No tenderness,    ?Extremity/Skin:- No  edema,   good pedal pulses  ?Psych-affect is flat, oriented x3 ?Neuro-dense left-sided hemiplegia, however speech, swallowing and mentation improving ? ?Data Reviewed: I have personally reviewed following labs and imaging studies ? ?CBC: ?Recent Labs  ?Lab 06/07/21 ?2014 06/08/21 ?0426 06/09/21 ?2229 06/10/21 ?0542  ?WBC 23.3* 24.0* 12.6* 13.4*  ?NEUTROABS  --   --  7.1 9.0*  ?HGB 10.3* 9.2* 7.7* 7.9*  ?HCT 33.3* 29.3* 25.9* 26.1*  ?MCV 79.3* 77.9* 83.3 84.7  ?PLT 606* 499* 333 326  ? ? ?Basic Metabolic Panel: ?Recent Labs  ?Lab 06/07/21 ?2211 06/08/21 ?0205 06/08/21 ?7989 06/09/21 ?2119 06/10/21 ?0542  ?NA 137 141 140 141 135  ?K 4.6 4.1 3.8 3.7 4.3  ?CL 103 108 109 112* 105  ?CO2 16* 20* 21* 21* 21*  ?GLUCOSE 563* 189* 192* 191* 341*  ?BUN 42* 35* 32* 26* 22  ?  CREATININE 1.64* 1.35* 1.36* 1.19* 1.25*  ?CALCIUM 9.2 9.3 8.7* 8.1* 8.1*  ?MG  --   --   --  2.1  --   ? ? ?CBG: ?Recent Labs  ?Lab 06/09/21 ?2059 06/10/21 ?3354 06/10/21 ?5625 06/10/21 ?1111 06/10/21 ?1651  ?GLUCAP 217* 303* 329* 346* 237*  ? ? ?Recent Results (from the past 240 hour(s))  ?Resp Panel by RT-PCR (Flu A&B, Covid) Nasopharyngeal Swab     Status: None  ? Collection Time: 06/07/21  7:58 PM  ? Specimen: Nasopharyngeal Swab; Nasopharyngeal(NP) swabs in vial transport medium  ?Result Value Ref Range Status  ? SARS Coronavirus 2 by RT PCR NEGATIVE NEGATIVE Final  ?  Comment: (NOTE) ?SARS-CoV-2 target nucleic acids are NOT DETECTED. ? ?The SARS-CoV-2 RNA is generally detectable in upper respiratory ?specimens during the acute phase of infection. The  lowest ?concentration of SARS-CoV-2 viral copies this assay can detect is ?138 copies/mL. A negative result does not preclude SARS-Cov-2 ?infection and should not be used as the sole basis for treatment or ?

## 2021-06-10 NOTE — Plan of Care (Signed)
?  Problem: Acute Rehab PT Goals(only PT should resolve) ?Goal: Pt Will Go Supine/Side To Sit ?Outcome: Progressing ?Flowsheets (Taken 06/10/2021 1154) ?Pt will go Supine/Side to Sit: with moderate assist ?Goal: Patient Will Transfer Sit To/From Stand ?Outcome: Progressing ?Flowsheets (Taken 06/10/2021 1154) ?Patient will transfer sit to/from stand: with moderate assist ?Goal: Pt Will Transfer Bed To Chair/Chair To Bed ?Outcome: Progressing ?Flowsheets (Taken 06/10/2021 1154) ?Pt will Transfer Bed to Chair/Chair to Bed: with mod assist ?Goal: Pt Will Ambulate ?Outcome: Progressing ?Flowsheets (Taken 06/10/2021 1154) ?Pt will Ambulate: ? 10 feet ? with moderate assist ? with maximum assist ? with rolling walker ?  ?11:54 AM, 06/10/21 ?Lonell Grandchild, MPT ?Physical Therapist with [place] ?[facility] ?[phone] office ?[phone] mobile phone ? ?

## 2021-06-10 NOTE — Plan of Care (Signed)
?  Problem: Acute Rehab OT Goals (only OT should resolve) ?Goal: Pt. Will Perform Eating ?Flowsheets (Taken 06/10/2021 1003) ?Pt Will Perform Eating: ? with modified independence ? sitting ?Goal: Pt. Will Perform Grooming ?Flowsheets (Taken 06/10/2021 1003) ?Pt Will Perform Grooming: ? with min guard assist ? sitting ? with min assist ? with adaptive equipment ?Goal: Pt. Will Perform Lower Body Bathing ?Flowsheets (Taken 06/10/2021 1003) ?Pt Will Perform Lower Body Bathing: ? with min assist ? bed level ?Goal: Pt. Will Perform Upper Body Dressing ?Flowsheets (Taken 06/10/2021 1003) ?Pt Will Perform Upper Body Dressing: ? with supervision ? sitting ? with adaptive equipment ?Goal: Pt. Will Perform Lower Body Dressing ?Flowsheets (Taken 06/10/2021 1003) ?Pt Will Perform Lower Body Dressing: ? with min assist ? with mod assist ? sitting/lateral leans ?Goal: Pt. Will Transfer To Toilet ?Flowsheets (Taken 06/10/2021 1003) ?Pt Will Transfer to Toilet: ? with min assist ? stand pivot transfer ? bedside commode ?Goal: Pt. Will Perform Toileting-Clothing Manipulation ?Flowsheets (Taken 06/10/2021 1003) ?Pt Will Perform Toileting - Clothing Manipulation and hygiene: ? with mod assist ? with adaptive equipment ? bed level ? sitting/lateral leans ?Goal: Pt/Caregiver Will Perform Home Exercise Program ?Flowsheets (Taken 06/10/2021 1003) ?Pt/caregiver will Perform Home Exercise Program: ? Increased ROM ? Increased strength ? Left upper extremity ? With minimal assist ? Najae Rathert OT, MOT ? ?

## 2021-06-10 NOTE — Progress Notes (Signed)
Inpatient Rehab Admissions Coordinator:  ? ?Spoke to pt's daughter in law, [name], regarding CIR recommendations.  Reviewed 3 hrs/day of therapy and need for 24/7 supervision, possibly min assist, at discharge.  Family is prepared to make this work between themselves and possibly hired caregivers.  We discussed need for insurance auth.  Awaiting workup to be more complete before I send to HTA for review.  Will f/u tomorrow.  ? ?Shann Medal, PT, DPT ?Admissions Coordinator ?[phone] ?06/10/21  ?2:46 PM ? ?

## 2021-06-10 NOTE — Progress Notes (Signed)
SLP Cancellation Note ? ?Patient Details ?Name: BECKY COLAN ?MRN: [id number] ?DOB: 1950/10/17 ? ? ?Cancelled treatment:       Reason Eval/Treat Not Completed: Patient at procedure or test/unavailable (Pt currently working with PT/OT. SLP will check back for SLP treatment as schedule permits.) ? ?Thank you, ? ?Genene Churn, [place] ?[phone] ? ?Beronica Lansdale ?06/10/2021, 8:44 AM ?

## 2021-06-11 ENCOUNTER — Inpatient Hospital Stay (HOSPITAL_COMMUNITY): Payer: PPO

## 2021-06-11 DIAGNOSIS — I639 Cerebral infarction, unspecified: Secondary | ICD-10-CM | POA: Diagnosis not present

## 2021-06-11 DIAGNOSIS — E1110 Type 2 diabetes mellitus with ketoacidosis without coma: Secondary | ICD-10-CM | POA: Diagnosis not present

## 2021-06-11 DIAGNOSIS — G9341 Metabolic encephalopathy: Secondary | ICD-10-CM | POA: Diagnosis not present

## 2021-06-11 DIAGNOSIS — N1832 Chronic kidney disease, stage 3b: Secondary | ICD-10-CM | POA: Diagnosis not present

## 2021-06-11 LAB — CULTURE, BLOOD (ROUTINE X 2)

## 2021-06-11 LAB — CBC
HCT: 27.5 % — ABNORMAL LOW (ref 36.0–46.0)
Hemoglobin: 8.1 g/dL — ABNORMAL LOW (ref 12.0–15.0)
MCH: 24.5 pg — ABNORMAL LOW (ref 26.0–34.0)
MCHC: 29.5 g/dL — ABNORMAL LOW (ref 30.0–36.0)
MCV: 83.3 fL (ref 80.0–100.0)
Platelets: 298 10*3/uL (ref 150–400)
RBC: 3.3 MIL/uL — ABNORMAL LOW (ref 3.87–5.11)
RDW: 17.2 % — ABNORMAL HIGH (ref 11.5–15.5)
WBC: 13.4 10*3/uL — ABNORMAL HIGH (ref 4.0–10.5)
nRBC: 0 % (ref 0.0–0.2)

## 2021-06-11 LAB — RENAL FUNCTION PANEL
Albumin: 2.6 g/dL — ABNORMAL LOW (ref 3.5–5.0)
Anion gap: 10 (ref 5–15)
BUN: 14 mg/dL (ref 8–23)
CO2: 23 mmol/L (ref 22–32)
Calcium: 8.5 mg/dL — ABNORMAL LOW (ref 8.9–10.3)
Chloride: 105 mmol/L (ref 98–111)
Creatinine, Ser: 1.05 mg/dL — ABNORMAL HIGH (ref 0.44–1.00)
GFR, Estimated: 56 mL/min — ABNORMAL LOW (ref >60)
Glucose, Bld: 222 mg/dL — ABNORMAL HIGH (ref 70–99)
Phosphorus: 4.5 mg/dL (ref 2.5–4.6)
Potassium: 4 mmol/L (ref 3.5–5.1)
Sodium: 138 mmol/L (ref 135–145)

## 2021-06-11 LAB — GLUCOSE, CAPILLARY
Glucose-Capillary: 189 mg/dL — ABNORMAL HIGH (ref 70–99)
Glucose-Capillary: 203 mg/dL — ABNORMAL HIGH (ref 70–99)
Glucose-Capillary: 353 mg/dL — ABNORMAL HIGH (ref 70–99)
Glucose-Capillary: 227 mg/dL — ABNORMAL HIGH (ref 70–99)

## 2021-06-11 IMAGING — MR MR THORACIC SPINE WO/W CM
13 of 19 series · 26 of 48 positions shown · IV contrast (8 ml Gadavist)
Comparison: CT angio neck [DATE]

CLINICAL DATA: History of breast cancer. Abnormal CT. Metastatic
evaluation.

EXAM:
MRI CERVICAL AND THORACIC SPINE WITHOUT AND WITH CONTRAST
TECHNIQUE: Multiplanar and multiecho pulse sequences of the cervical spine, to
include the craniocervical junction and cervicothoracic junction,
and the thoracic spine, were obtained without and with intravenous
contrast.
CONTRAST:  8mL GADAVIST GADOBUTROL 1 MMOL/ML IV SOLN

[Series 16: T1 · sagittal · 5.0mm · 1.56mm/px · 1 of 9 slices shown (1 of 6)]
[im 1/9]
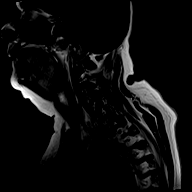

[Series 17: T1 · sagittal · 5.0mm · 1.17mm/px · 1 of 9 slices shown (2 of 6)]
[im 1/9]
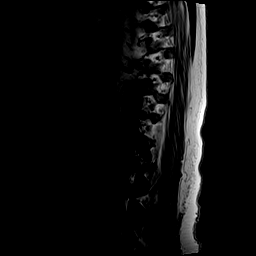

[Series 18: T1 · sagittal · 6.0mm · 1.17mm/px · 1 of 8 slices shown (3 of 6)]
[im 1/8]
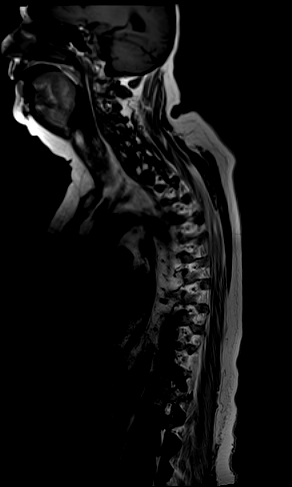

[Series 22: T2 · axial · 5.0mm · 0.74mm/px · z∈[-311,-83]mm · 3 of 39 slices shown (1 of 2)]
[im 1/39]
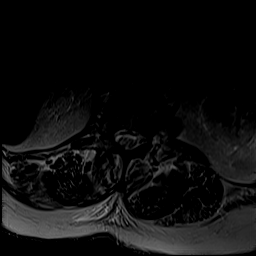
[im 20/39]
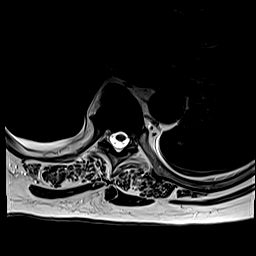
[im 39/39]
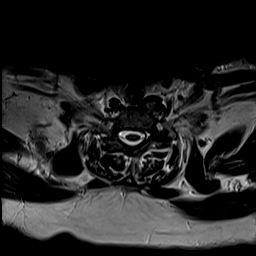

[Series 24: T1 · axial · non-contrast · 4.0mm · 0.37mm/px · z∈[-310,-86]mm · 4 of 39 slices shown (4 of 6)]
[im 1/39]
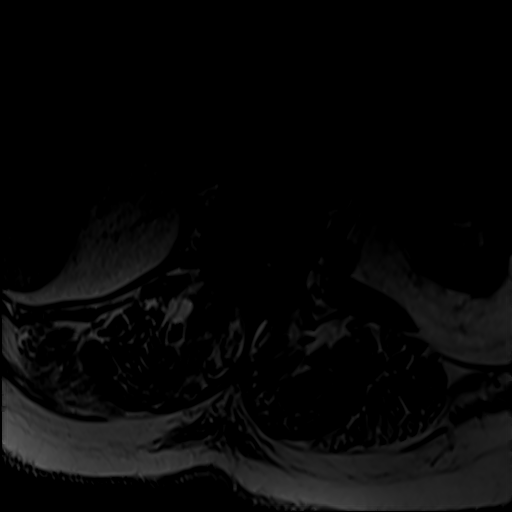
[im 13/39]
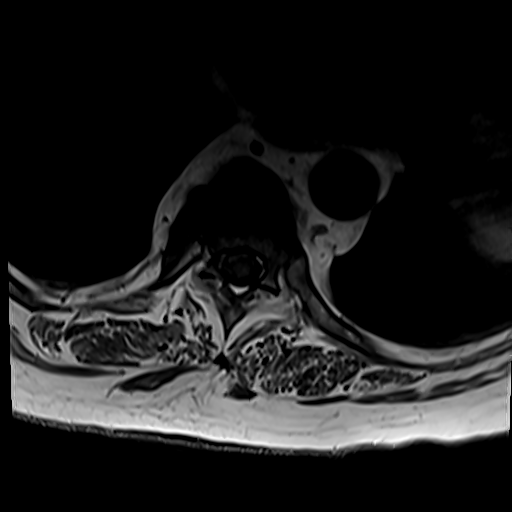
[im 26/39]
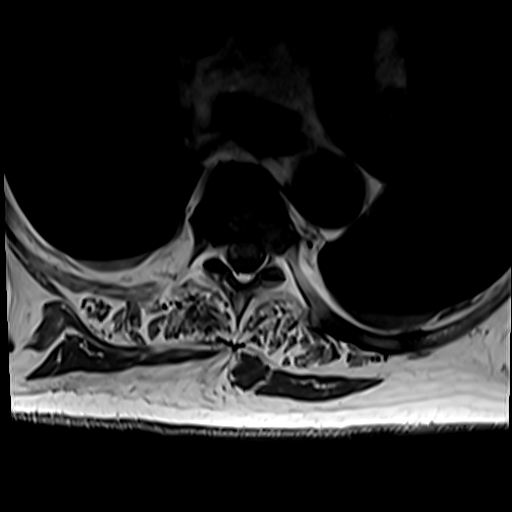
[im 39/39]
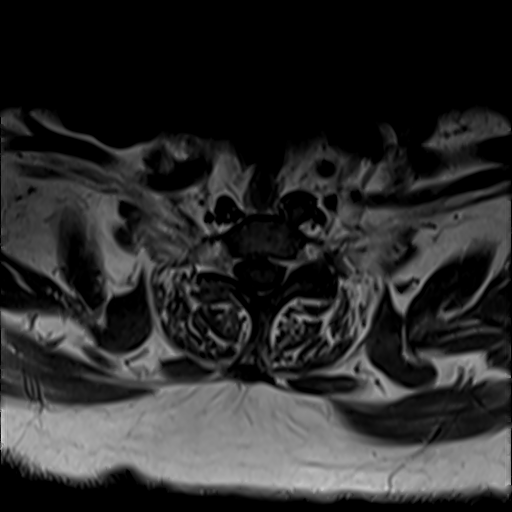

[Series 27: STIR · sagittal · 3.0mm · 0.86mm/px · 2 of 16 slices shown (1 of 2)]
[im 1/16]
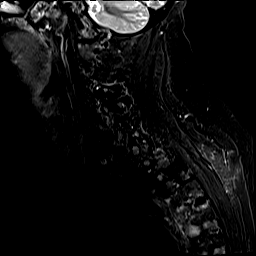
[im 16/16]
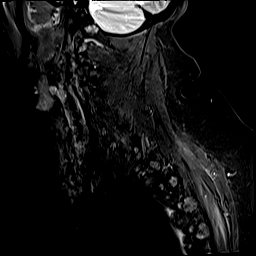

[Series 30: T1 · axial · non-contrast · 3.0mm · 0.35mm/px · z∈[-100,+17]mm · 4 of 40 slices shown (5 of 6)]
[im 1/40]
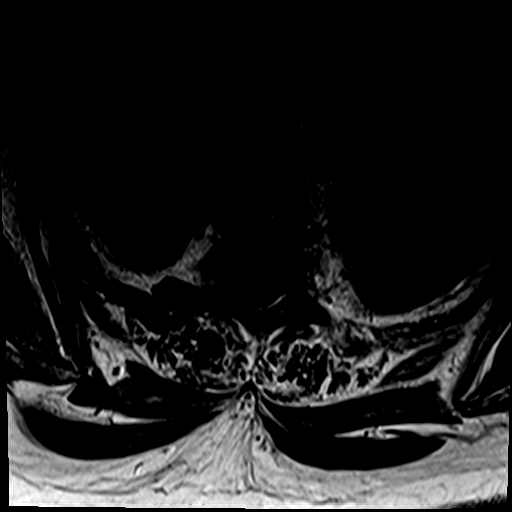
[im 14/40]
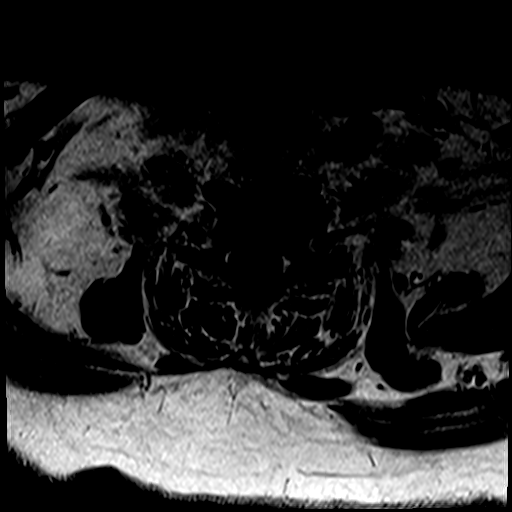
[im 27/40]
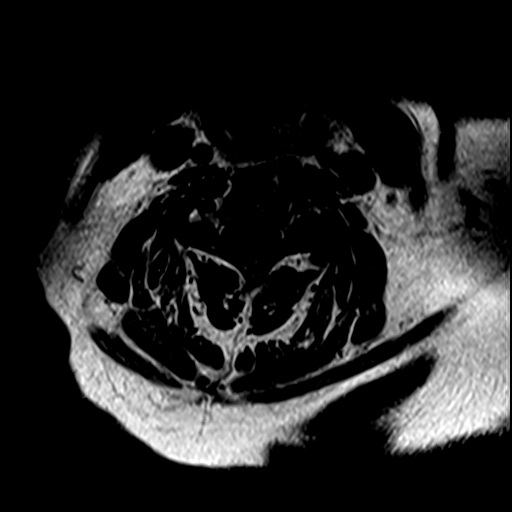
[im 40/40]
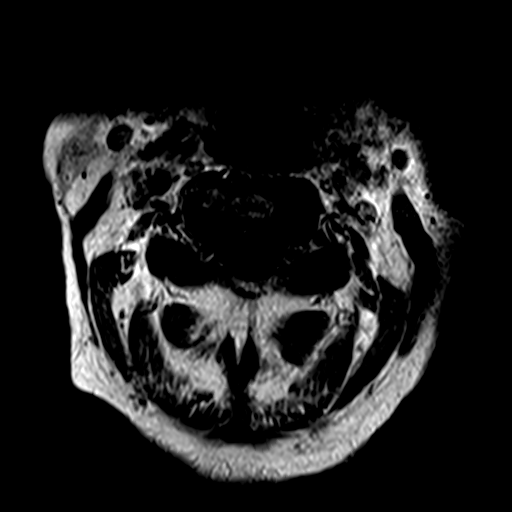

[Series 35: T2 · sagittal · 3.0mm · 1.06mm/px · 2 of 21 slices shown (2 of 2)]
[im 1/21]
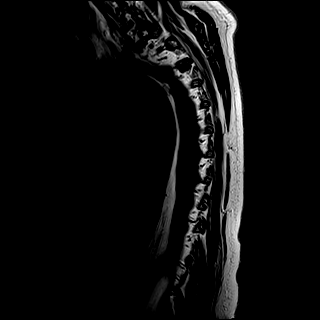
[im 21/21]
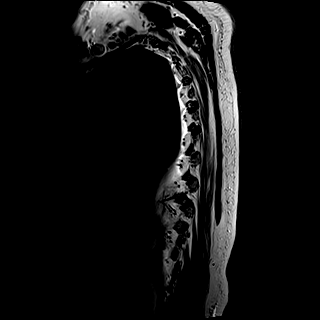

[Series 36: T1 · sagittal · 3.0mm · 1.33mm/px · 2 of 21 slices shown (6 of 6)]
[im 1/21]
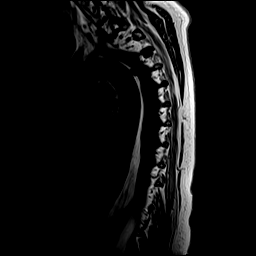
[im 21/21]
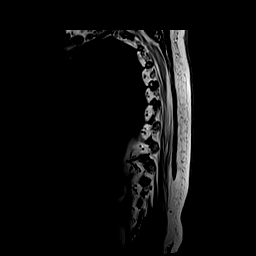

[Series 37: STIR · sagittal · 3.0mm · 0.53mm/px · 2 of 21 slices shown (2 of 2)]
[im 1/21]
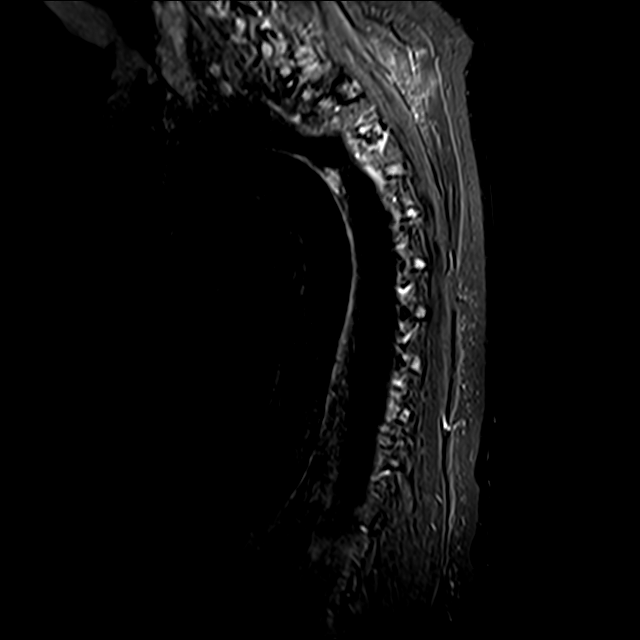
[im 21/21]
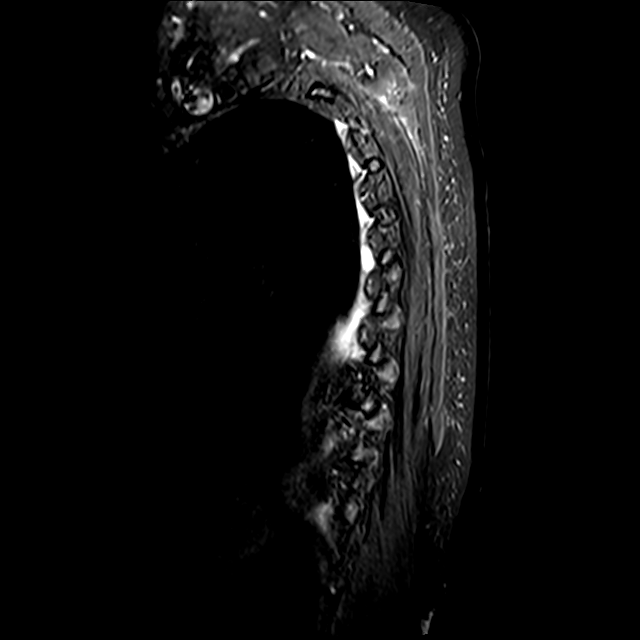

[Series 39: T1 post-contrast · axial · 3.0mm · 0.35mm/px · 1 of 40 slices shown]
[im 1/40]
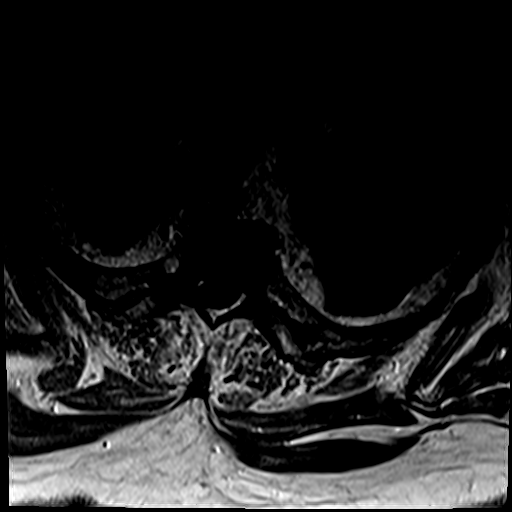

[Series 40: T1 fat-sat post-contrast · sagittal · 3.0mm · 0.43mm/px · 1 of 15 slices shown (1 of 2)]
[im 1/15]
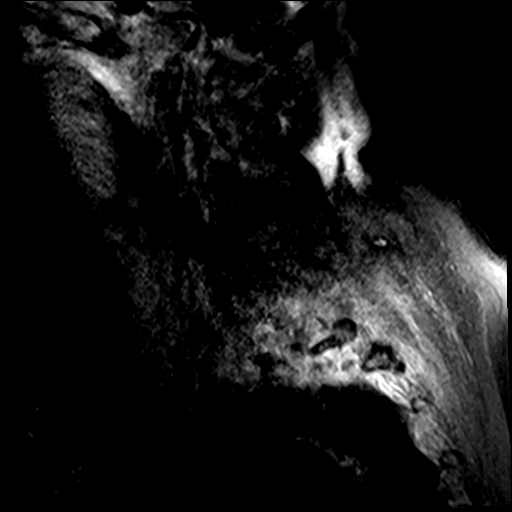

[Series 42: T1 fat-sat post-contrast · sagittal · 3.0mm · 1.06mm/px · 2 of 21 slices shown (2 of 2)]
[im 1/21]
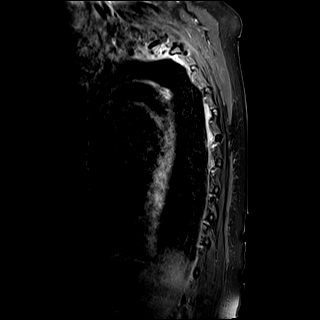
[im 21/21]
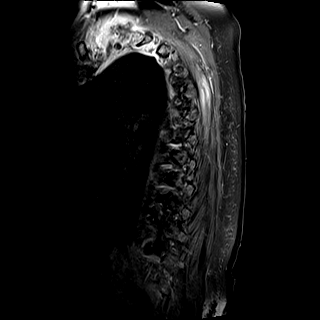

[26 of 48 positions shown; findings below may reference images not displayed]

FINDINGS: MRI CERVICAL SPINE FINDINGS

Alignment: Normal alignment.

Image quality degraded by motion specially the postcontrast images.

Vertebrae: Abnormal T1 and T2 low signal in the C5 vertebral body
consistent with sclerotic tumor as noted on CT. No fracture. No
other cervical spine lesion identified.

Cord: Negative for cord compression. Cord signal is normal
throughout.

Posterior Fossa, vertebral arteries, paraspinal tissues: Negative
for paraspinous mass or edema.

Disc levels:

C2-3: Mild facet degeneration.  Negative for stenosis

C3-4: Mild disc and facet degeneration.  Negative for stenosis

C4-5: Mild disc and facet degeneration.  Negative for stenosis.

C5-6: Mild disc and facet degeneration.  Negative for stenosis

C6-7: Negative

C7-T1: Negative

MRI THORACIC SPINE FINDINGS

Alignment:  Normal

Vertebrae: Abnormal bone marrow signal throughout most of the
thoracic vertebral bodies compatible with metastatic disease. These
areas are low signal on T1 and T2 and show mild enhancement. In
addition, there is abnormal signal in the T3 spinous process with
adjacent soft tissue mass compatible with metastatic disease.

Negative for thoracic fracture.

Several rib metastasis also identified.

Cord: Negative for cord compression or spinal stenosis. Cord signal
normal.

Paraspinal and other soft tissues: 3 cm soft tissue mass surrounding
the spinous process of T3 compatible with metastatic disease. This
extends into the paraspinous muscles bilaterally. Small pleural
effusions.

Disc levels:

Minimal disc degeneration in the thoracic spine. No disc protrusion
or stenosis identified.
IMPRESSION: 1. Metastatic disease C5 vertebral body is noted on CT. Probable
breast cancer. No fracture or cord compression identified in the
cervical spine.
2. Widespread metastatic disease throughout much of the thoracic
spine. In addition, there is a metastatic deposit in the T3 spinous
process with adjacent 3 cm soft tissue mass.
3. No fracture or cord compression in the thoracic spine.

## 2021-06-11 MED ORDER — GADOBUTROL 1 MMOL/ML IV SOLN
10.0000 mL | Freq: Once | INTRAVENOUS | Status: AC | PRN
  Administered 2021-06-11: 8 mL via INTRAVENOUS

## 2021-06-11 MED ORDER — INSULIN GLARGINE-YFGN 100 UNIT/ML ~~LOC~~ SOLN
50.0000 [IU] | SUBCUTANEOUS | Status: DC
  Administered 2021-06-11 – 2021-06-12 (×2): 50 [IU] via SUBCUTANEOUS
  Filled 2021-06-11 (×4): qty 0.5

## 2021-06-11 NOTE — Progress Notes (Signed)
?PROGRESS NOTE ? ? ?Margaret MCWHERTER  TZG:[id number] DOB: 03-21-1950 DOA: 06/07/2021 ?PCP: [name], MD  ? ?Chief Complaint  ?Patient presents with  ? Hyperglycemia  ? ?Level of care: Telemetry ? ?Brief Admission History:  ?71 y.o. female with medical history significant for diabetes mellitus, Parkinson's disease, COPD respiratory failure on 2 L, CKD 3, breast cancer. ?  ?Patient was brought to the ED from home, reports of altered mental status and high blood sugar.  Patient's son found patient on the floor.  Patient's son talked to patient yesterday at about 11 in the morning and this was the last time patient was here was seen normal.  Today family called patient, patient did not appear to be normal on the phone, and her breathing was labored.  They went over to check on patient and saw patient on the floor, she got up on her knees but could not stand up.   EMS was called, blood sugar read high.  Patient was on room air, her O2 sats were 82%.  At the time of my evaluation, patient is altered, unable to provide any history.  Daughter and son at bedside, they report that in January patient blood sugars were low in the 40s, medications were gradually reduced and stopped.  She was on [place], Ozempic.  She is currently still on metformin. ?  ?ED Course: Temperature 97.7.  Tachycardic heart rate 190 117.  Respirate rate 18-32.  Blood pressure systolic 759F to 638G.  O2 Sats 100% on 2 L.  Blood sugar 785, anion gap of 20, serum bicarb of 15.  Leukocytosis of 23.3.  Beta Hydroxybutyric acid 2.1. ?1.8 L bolus given, hospitalist to admit for DKA. ? ? ?06/08/2021 ?MRI reveals: Multiple small patchy acute cortical/subcortical infarcts within the right cerebral hemisphere affecting the posterior frontal lobe, parietal lobe, lateral occipital lobe and posterior temporal lobe.  These infarcts affect the posterior right MCA vascular territory, right MCA/PCA watershed territory and right MCA/ACA watershed territory.  ? ?Neurology  consultation requested.  Stroke orderset initiated. ?Pt transitioned off IV insulin to subcutaneous insulin.   ? ?06/09/2021:  Pt is going to need extensive rehabilitation.  PT/OT/SLP evaluations. Spoke with family who is agreeable to rehab.  Held off on CTA due to concerns about using contrast dye with poor renal function. Continue to hydrate today.  ? ?  ?Assessment and Plan: ? ?-Acute stroke--multiple small patchy acute cortical/subcortical infarcts within the right cerebellar hemisphere ?CTA neck from 06/10/2021 shows-Mural adherent thrombus in the distal right common carotid artery, extending into the right ECA, causing approximately 60% stenosis of the right ECA. No significant stenosis of the right ICA. ?--Above discussed with [name] on 06/10/2021 recommends ?-Official neuro reconsult requested ?-Load with aspirin 325 mg x 1, load with Plavix 300 mg x 1 then ?-Starting 06/11/2021 aspirin 81 mg daily along with Plavix 75 mg daily for 90 days then after that STOP the Plavix  and continue ONLY Aspirin 81 mg daily indefinitely--for secondary stroke Prevention (Per The multicenter SAMMPRIS trial) ?-PT /OT eval appreciated recommends CIR ?-LDL 67, HDL 29 total cholesterol 135--give Lipitor 40 mg daily... Even if her lipid panel is within desired limits, patient should still take Lipitor/Statin for it's Pleiotropic effects (beyond cholesterol lowering benefits) ?-Echo with EF of 70 to 65% and grade 1 diastolic dysfunction with normal pulmonary artery pressures ? ?Metastatic breast cancer- ?CTA neck from 06/10/2021 showed Sclerosis of C5 and the visualized thoracic spine (T1-T4), concerning for a neoplastic process,  possibly metastatic breast ?cancer given the patient's history of prior left-sided breast cancer that required mastectomy and chemotherapy ?- additional sclerotic foci in the right third rib. ? MRI of the cervical and thoracic spine is consistent with metastatic disease without evidence of cord compression ?-At  this time patient and family would like to defer further oncology work-up ?-Once patient recovers from her stroke, they may consider oncology consult to discuss further work-up and possible palliative chemo or radiation therapy options ?--May also need PET scan and oncology follow-up as outpatient after resolution of acute neuro issues ? ?* DKA (diabetic ketoacidosis)  ?Patient met criteria for DKA on admission ?-DKA pathophysiology resolved patient has been transitioned off IV insulin back to subcu insulin at this time ?-A1c 10.3 reflecting uncontrolled DM with hyperglycemia PTA ? ?Leukocytosis ?WBC 23.3, chronically elevated, last check on file 3 to 4 years ago, persistent leukocytosis ranging from 11-18..  Chest x-ray clear.  Afebrile.  At this time no focus of infection identified. ?-May need to follow-up with hem/onc on discharge ?-WBC appears closer to patient's baseline at this time ? ?Acute metabolic encephalopathy ?Acute metabolic encephalopathy with baseline Parkinson's disease.  With superimposed acute stroke ?-Mentation has improved significantly ? ?Staph epi bacteremia ?-- Blood cultures from 06/08/2021 2/2 bottles positive ?-- Pt has severe anaphylaxis reaction to cephalexin ?-- vancomycin per pharmacy  ?-This may be a contaminant , plan to discontinue vancomycin on 06/12/2021 if repeat blood cultures remain negative ? ?AKI (acute kidney injury) ([place]) on CKD 3B ?AKI on CKD 3A-B.  Creatinine 1.8, baseline to 1.3. ?-Creatinine down to 1.0 with hydration ?-Patient had CTA neck on 06/10/2021--- monitor renal function ? ?COPD (chronic obstructive pulmonary disease) ([place]) ?Stable. ?Continue current regimen ? ?Secondary Parkinson disease ([place]) ?--resume home medications  ? ? anemia--- ??  Acute versus acute on chronic --Hgb currently around 8 ?-No recent baseline available ?-No evidence of ongoing bleeding per se ?-Continue to monitor closely ? ?Social/ethics----plan of care and advanced directives discussed with  patient and family at bedside ?-They request DNR/DNI status ? ? ?DVT prophylaxis: SCDs ?Code Status: Full  ?Family Communication: son, DIL 4/3, 4/4  ?Disposition: Status is: Inpatient ?Remains inpatient appropriate because: acute stroke treatment  ?  ?Consultants:  ?Neurology  ?Procedures:  ?MRI/MRA brain ?CTA neck ?Antimicrobials:  ?  ?Subjective: ?Persistent dense left-sided hemiplegia ?-Patient's son Gerald Stabs and patient's daughter-in-law [name] at bedside, questions answered ?-Oral intake is fair ?-No fever  Or chills  ?-No emesis ?-Somewhat tired after working with physical therapy ? ? ?Objective: ?Vitals:  ? 06/10/21 1410 06/10/21 2215 06/11/21 0532 06/11/21 1623  ?BP: 140/69 (!) 166/68 (!) 123/55 (!) 133/32  ?Pulse: 66 81 74 79  ?Resp: (!) $RemoveBe'24 19 20 20  '[id number]$ ?Temp: 98.1 ?F (36.7 ?C) 98.2 ?F (36.8 ?C) 98.4 ?F (36.9 ?C) 99.9 ?F (37.7 ?C)  ?TempSrc: Oral  Oral Axillary  ?SpO2: 98% 97% 100% 100%  ?Weight:      ?Height:      ? ? ?Intake/Output Summary (Last 24 hours) at 06/11/2021 1749 ?Last data filed at 06/11/2021 1300 ?Gross per 24 hour  ?Intake 1268.44 ml  ?Output 1700 ml  ?Net -431.56 ml  ? ? ?Filed Weights  ? 06/07/21 1942 06/08/21 0031 06/08/21 0300  ?Weight: 91 kg 83.8 kg 83.9 kg  ? ?Examination: ? ? ?Physical Exam ? ?Gen:- Awake Alert, in no acute distress  ?HEENT:- [place].AT, No sclera icterus ?Neck-Supple Neck,No JVD,.  ?Lungs-  CTAB , fair air movement bilaterally  ?CV- S1,  S2 normal, RRR ?Abd-  +ve B.Sounds, Abd Soft, No tenderness,    ?Extremity/Skin:- No  edema,   good pedal pulses  ?Psych-affect is flat, oriented x3 ?Neuro-dense left-sided hemiplegia, however speech, swallowing and mentation improving ? ?Data Reviewed: I have personally reviewed following labs and imaging studies ? ?CBC: ?Recent Labs  ?Lab 06/07/21 ?2014 06/08/21 ?0426 06/09/21 ?9201 06/10/21 ?0071 06/11/21 ?2197  ?WBC 23.3* 24.0* 12.6* 13.4* 13.4*  ?NEUTROABS  --   --  7.1 9.0*  --   ?HGB 10.3* 9.2* 7.7* 7.9* 8.1*  ?HCT 33.3* 29.3* 25.9* 26.1*  27.5*  ?MCV 79.3* 77.9* 83.3 84.7 83.3  ?PLT 606* 499* 333 326 298  ? ? ?Basic Metabolic Panel: ?Recent Labs  ?Lab 06/08/21 ?0205 06/08/21 ?5883 06/09/21 ?2549 06/10/21 ?8264 06/11/21 ?1583  ?NA 141 140 14

## 2021-06-11 NOTE — Progress Notes (Signed)
Inpatient Diabetes Program Recommendations ? ?AACE/ADA: New Consensus Statement on Inpatient Glycemic Control  ? ?Target Ranges:  Prepandial:   less than 140 mg/dL ?     Peak postprandial:   less than 180 mg/dL (1-2 hours) ?     Critically ill patients:  140 - 180 mg/dL  ? ? Latest Reference Range & Units 06/11/21 03:17 06/11/21 07:30  ?Glucose-Capillary 70 - 99 mg/dL 189 (H) 203 (H)  ? ? Latest Reference Range & Units 06/10/21 08:28 06/10/21 11:11 06/10/21 16:51 06/10/21 22:15  ?Glucose-Capillary 70 - 99 mg/dL 329 (H) 346 (H) 237 (H) 237 (H)  ? ?Review of Glycemic Control ? ?Diabetes history: DM2 ?Outpatient Diabetes medications: Metformin XR 1000 mg daily, 70/30 35 units BID  ?Current orders for Inpatient glycemic control: Semglee 45 units Q24H, Novolog 0-15 units TID with meals, Novolog 0-5 units QHS, Novolog 14 units TID with meals ?  ?Inpatient Diabetes Program Recommendations:   ?  ?Insulin: Fasting glucose 203 mg/dl this morning. Please consider increasing Semglee to 50 units Q24H and increase meal coverage to Novolog 18 units TID. ?  ?Thanks, ?Barnie Alderman, RN, MSN, CDE ?Diabetes Coordinator ?Inpatient Diabetes Program ?[phone] (Team Pager from 8am to 5pm) ? ? ? ?

## 2021-06-11 NOTE — Progress Notes (Signed)
Inpatient Rehab Admissions Coordinator:  ? ?Workup appears nearly complete.  Will send to [facility] for review for CIR prior auth.  ? ?Shann Medal, PT, DPT ?Admissions Coordinator ?[phone] ?06/11/21  ?10:49 AM ? ?

## 2021-06-11 NOTE — Plan of Care (Signed)
?  Problem: Clinical Measurements: ?Goal: Respiratory complications will improve ?Outcome: Progressing ?  ?Problem: Nutrition: ?Goal: Adequate nutrition will be maintained ?Outcome: Progressing ?  ?Problem: Coping: ?Goal: Level of anxiety will decrease ?Outcome: Progressing ?  ?Problem: Elimination: ?Goal: Will not experience complications related to bowel motility ?Outcome: Progressing ?Goal: Will not experience complications related to urinary retention ?Outcome: Progressing ?  ?Problem: Pain Managment: ?Goal: General experience of comfort will improve ?Outcome: Progressing ?  ?Problem: Safety: ?Goal: Ability to remain free from injury will improve ?Outcome: Progressing ?  ?Problem: Safety: ?Goal: Ability to remain free from injury will improve ?Outcome: Progressing ?  ?Problem: Skin Integrity: ?Goal: Risk for impaired skin integrity will decrease ?Outcome: Progressing ?  ?

## 2021-06-11 NOTE — Plan of Care (Signed)
?  Problem: Education: ?Goal: Knowledge of General Education information will improve ?Description: Including pain rating scale, medication(s)/side effects and non-pharmacologic comfort measures ?Outcome: Progressing ?  ?Problem: Clinical Measurements: ?Goal: Ability to maintain clinical measurements within normal limits will improve ?Outcome: Progressing ?  ?Problem: Education: ?Goal: Knowledge of General Education information will improve ?Description: Including pain rating scale, medication(s)/side effects and non-pharmacologic comfort measures ?Outcome: Progressing ?  ?Problem: Clinical Measurements: ?Goal: Ability to maintain clinical measurements within normal limits will improve ?Outcome: Progressing ?  ?Problem: Education: ?Goal: Knowledge of disease or condition will improve ?Outcome: Progressing ?Goal: Knowledge of secondary prevention will improve (SELECT ALL) ?Outcome: Progressing ?  ?Problem: Coping: ?Goal: Will verbalize positive feelings about self ?Outcome: Progressing ?Goal: Will identify appropriate support needs ?Outcome: Progressing ?  ?Problem: Self-Care: ?Goal: Ability to participate in self-care as condition permits will improve ?Outcome: Progressing ?  ?Problem: Ischemic Stroke/TIA Tissue Perfusion: ?Goal: Complications of ischemic stroke/TIA will be minimized ?Outcome: Progressing ?  ?

## 2021-06-11 NOTE — Progress Notes (Signed)
Physical Therapy Treatment ?Patient Details ?Name: Margaret Mcdaniel ?MRN: [id number] ?DOB: Aug 26, 1950 ?Today's Date: 06/11/2021 ? ? ?History of Present Illness Margaret Mcdaniel is a 71 y.o. female with medical history significant for diabetes mellitus, Parkinson's disease, COPD respiratory failure on 2 L, CKD 3, breast cancer.     Patient was brought to the ED from home, reports of altered mental status and high blood sugar.  Patient's son found patient on the floor.  Patient's son talked to patient yesterday at about 11 in the morning and this was the last time patient was here was seen normal.  Today family called patient, patient did not appear to be normal on the phone, and her breathing was labored.  They went over to check on patient and saw patient on the floor, she got up on her knees but could not stand up.    EMS was called, blood sugar read high.  Patient was on room air, her O2 sats were 82%.  At the time of my evaluation, patient is altered, unable to provide any history.  Daughter and son at bedside, they report that in January patient blood sugars were low in the 40s, medications were gradually reduced and stopped.  She was on [place], Ozempic.  She is currently still on metformin. ? ?  ?PT Comments  ? ? Patient presents with slightly flat affect and agreeable for therapy.  Patient demonstrates slow labored movement for sitting up at bedside with limited use of LUE due to weakness and neglect, once seated frequent leaning to the left requiring repeated verbal/tactile cueing to correct, able to complete BLE exercises with active assistance to complete with LLE, and had most difficulty attempting left ankle dorsiflexion and left hip flexion due to weakness.  Patient able to take a few slow labored side steps at bedside with mostly dragging/shuffling of LLE requiring Max tactile cueing to advance and tolerated standing with RW for up to 4-5 minutes with fair carryover for gripping RW with left hand.  Patient  tolerated sitting up in chair after therapy with family members present in room - RN aware.  Patient will benefit from continued skilled physical therapy in hospital and recommended venue below to increase strength, balance, endurance for safe ADLs and gait.  ? ?   ?Recommendations for follow up therapy are one component of a multi-disciplinary discharge planning process, led by the attending physician.  Recommendations may be updated based on patient status, additional functional criteria and insurance authorization. ? ?Follow Up Recommendations ? Acute inpatient rehab (3hours/day) ?  ?  ?Assistance Recommended at Discharge Intermittent Supervision/Assistance  ?Patient can return home with the following A lot of help with bathing/dressing/bathroom;A lot of help with walking and/or transfers;Help with stairs or ramp for entrance;Assistance with cooking/housework;Assist for transportation ?  ?Equipment Recommendations ? Rolling walker (2 wheels);None recommended by PT  ?  ?Recommendations for Other Services   ? ? ?  ?Precautions / Restrictions Precautions ?Precautions: Fall ?Restrictions ?Weight Bearing Restrictions: No  ?  ? ?Mobility ? Bed Mobility ?Overal bed mobility: Needs Assistance ?Bed Mobility: Supine to Sit ?  ?  ?Supine to sit: Mod assist ?  ?  ?General bed mobility comments: increased time, labored movement, limited using LUE due to weakness ?  ? ?Transfers ?Overall transfer level: Needs assistance ?Equipment used: Rolling walker (2 wheels) ?Transfers: Sit to/from Stand, Bed to chair/wheelchair/BSC ?Sit to Stand: Min assist, Mod assist ?Stand pivot transfers: Mod assist ?  ?  ?  ?  ?  General transfer comment: unsteady on feet with difficulty advancing LLE due to weakness ?  ? ?Ambulation/Gait ?Ambulation/Gait assistance: Max assist ?Gait Distance (Feet): 5 Feet ?Assistive device: Rolling walker (2 wheels) ?Gait Pattern/deviations: Decreased step length - right, Decreased step length - left, Decreased  stance time - left, Decreased stride length, Decreased dorsiflexion - left, Shuffle ?Gait velocity: slow ?  ?  ?General Gait Details: limited to a few slow labored unsteady side steps with poor return for advancing LLE due to weakness requiring Max tactile cueing to assist with shuffling left foot ? ? ?Stairs ?  ?  ?  ?  ?  ? ? ?Wheelchair Mobility ?  ? ?Modified Rankin (Stroke Patients Only) ?  ? ? ?  ?Balance Overall balance assessment: Needs assistance ?Sitting-balance support: Feet supported, No upper extremity supported ?Sitting balance-Leahy Scale: Poor ?Sitting balance - Comments: fair/poor seated at EOB ?Postural control: Left lateral lean ?Standing balance support: During functional activity, Bilateral upper extremity supported, Reliant on assistive device for balance ?Standing balance-Leahy Scale: Poor ?Standing balance comment: using RW ?  ?  ?  ?  ?  ?  ?  ?  ?  ?  ?  ?  ? ?  ?Cognition Arousal/Alertness: Awake/alert ?Behavior During Therapy: Flat affect, WFL for tasks assessed/performed ?Overall Cognitive Status: Within Functional Limits for tasks assessed ?  ?  ?  ?  ?  ?  ?  ?  ?  ?  ?  ?  ?  ?  ?  ?  ?General Comments: requires frequent repeated verbal/tactile cueing due to left sided neglect ?  ?  ? ?  ?Exercises General Exercises - Lower Extremity ?Long Arc Quad: Seated, AROM, Strengthening, Both, 10 reps, AAROM ?Hip Flexion/Marching: Seated, AROM, AAROM, Strengthening, Both, 10 reps ?Toe Raises: Seated, AROM, AAROM, Strengthening, Both, 10 reps ?Heel Raises: Seated, AROM, AAROM, Strengthening, Both, 10 reps ? ?  ?General Comments   ?  ?  ? ?Pertinent Vitals/Pain Pain Assessment ?Pain Assessment: No/denies pain  ? ? ?Home Living   ?  ?  ?  ?  ?  ?  ?  ?  ?  ?   ?  ?Prior Function    ?  ?  ?   ? ?PT Goals (current goals can now be found in the care plan section) Acute Rehab PT Goals ?Patient Stated Goal: return home after rehab ?PT Goal Formulation: With patient ?Time For Goal Achievement:  06/24/21 ?Potential to Achieve Goals: Good ?Progress towards PT goals: Progressing toward goals ? ?  ?Frequency ? ? ? Min 5X/week ? ? ? ?  ?PT Plan Current plan remains appropriate  ? ? ?Co-evaluation   ?  ?  ?  ?  ? ?  ?AM-PAC PT "6 Clicks" Mobility   ?Outcome Measure ? Help needed turning from your back to your side while in a flat bed without using bedrails?: A Lot ?Help needed moving from lying on your back to sitting on the side of a flat bed without using bedrails?: A Lot ?Help needed moving to and from a bed to a chair (including a wheelchair)?: A Lot ?Help needed standing up from a chair using your arms (e.g., wheelchair or bedside chair)?: A Lot ?Help needed to walk in hospital room?: A Lot ?Help needed climbing 3-5 steps with a railing? : Total ?6 Click Score: 11 ? ?  ?End of Session Equipment Utilized During Treatment: Oxygen ?Activity Tolerance: Patient tolerated treatment well;Patient limited by fatigue ?Patient  left: in chair;with call bell/phone within reach;with family/visitor present ?Nurse Communication: Mobility status ?PT Visit Diagnosis: Other abnormalities of gait and mobility (R26.89);Unsteadiness on feet (R26.81);Muscle weakness (generalized) (M62.81) ?  ? ? ?Time: 5697-9480 ?PT Time Calculation (min) (ACUTE ONLY): 35 min ? ?Charges:  $Therapeutic Exercise: 8-22 mins ?$Therapeutic Activity: 8-22 mins          ?          ? ?12:14 PM, 06/11/21 ?Lonell Grandchild, MPT ?Physical Therapist with [place] ?[facility] ?[phone] office ?[phone] mobile phone ? ? ?

## 2021-06-11 NOTE — Progress Notes (Addendum)
I connected with  Margaret Mcdaniel on 06/11/21 by a video enabled telemedicine application and verified that I am speaking with the correct person using two identifiers. ?  ?I discussed the limitations of evaluation and management by telemedicine. The patient expressed understanding and agreed to proceed. ? ?Location of patient: [facility] ?Location of physician: [facility] ? ?Subjective: No acute events overnight.  Patient's son and daughter-in-law at bedside.  No new concerns. ? ?ROS: negative except above ? ?Examination ? ?Vital signs in last 24 hours: ?Temp:  [98.1 ?F (36.7 ?C)-98.4 ?F (36.9 ?C)] 98.4 ?F (36.9 ?C) (04/06 0532) ?Pulse Rate:  [66-81] 74 (04/06 0532) ?Resp:  [19-24] 20 (04/06 0532) ?BP: (123-166)/(55-69) 123/55 (04/06 0532) ?SpO2:  [97 %-100 %] 100 % (04/06 0532) ? ?General: Sitting in chair, not in apparent distress ?Neuro: Drowsy, keeps trying to fall asleep but does wake up to verbal stimulation, oriented to place and person, follows commands, no evidence of aphasia, PERRLA, EOMI, possible left hemineglect, no antigravity strength in left upper and left lower extremity, antigravity strength without drift in right upper and right lower extremity, decreased sensation to light touch in left side of face arm and leg ? ?Basic Metabolic Panel: ?Recent Labs  ?Lab 06/08/21 ?0205 06/08/21 ?0923 06/09/21 ?3007 06/10/21 ?6226 06/11/21 ?3335  ?NA 141 140 141 135 138  ?K 4.1 3.8 3.7 4.3 4.0  ?CL 108 109 112* 105 105  ?CO2 20* 21* 21* 21* 23  ?GLUCOSE 189* 192* 191* 341* 222*  ?BUN 35* 32* 26* 22 14  ?CREATININE 1.35* 1.36* 1.19* 1.25* 1.05*  ?CALCIUM 9.3 8.7* 8.1* 8.1* 8.5*  ?MG  --   --  2.1  --   --   ?PHOS  --   --   --   --  4.5  ? ? ?CBC: ?Recent Labs  ?Lab 06/07/21 ?2014 06/08/21 ?0426 06/09/21 ?4562 06/10/21 ?5638 06/11/21 ?9373  ?WBC 23.3* 24.0* 12.6* 13.4* 13.4*  ?NEUTROABS  --   --  7.1 9.0*  --   ?HGB 10.3* 9.2* 7.7* 7.9* 8.1*  ?HCT 33.3* 29.3* 25.9* 26.1* 27.5*  ?MCV 79.3* 77.9*  83.3 84.7 83.3  ?PLT 606* 499* 333 326 298  ? ? ?Coagulation Studies: ?No results for input(s): LABPROT, INR in the last 72 hours. ? ?Imaging ? ?CTA neck with and without contrast 06/10/2021:  ?1. Mural adherent thrombus in the distal right common carotid ?artery, extending into the right ECA, causing approximately 60% ?stenosis of the right ECA. No significant stenosis of the right ICA. ?2. No other hemodynamically significant stenosis in the neck. ?3. Sclerosis of C5 and the visualized thoracic spine (T1-T4), ?concerning for a neoplastic process, possibly metastatic breast ?cancer given the patient's history. Additional sclerotic foci in the ?right third rib. Consider bone scan or MRI of the cervical and ?thoracic spine, if clinically indicated. ? ?ASSESSMENT AND PLAN: 71 year old female who presented with acute ischemic stroke ? ?Acute ischemic stroke ?Carotid embolus ?- No new neurological deficits ?  ?Recommendations: ?-I discussed the case with Dr. Denton Brick yesterday and recommended  loading with aspirin 325 mg and Plavix 300 mg ?-Start aspirin 81 mg and Plavix 75 mg daily from today ?-Recommend continuing dual antiplatelets for 3 months followed by repeat CTA neck. ?-Recommend follow-up with [name] at [facility] neurology in 3 months after CTA neck ?-MRI C-spine and T-spine due to concern for malignancy on CTA neck ? -While inpatient, q4 hr neuro checks ?- STAT head CT and CTA head and  neck for any change in neuro exam ?-Discussed plan with Dr. Denton Brick, patient's family at bedside ? ?I have spent a total of  37  minutes with the patient reviewing hospital notes,  test results, labs and examining the patient as well as establishing an assessment and plan that was discussed personally with the patient's family at bedside.  > 50% of time was spent in direct patient care. ?  ?Margaret Mcdaniel ?Epilepsy ?[facility] ?For questions after 5pm please refer to AMION to reach the Neurologist on call ? ?

## 2021-06-12 ENCOUNTER — Inpatient Hospital Stay (HOSPITAL_COMMUNITY): Payer: PPO

## 2021-06-12 DIAGNOSIS — E1110 Type 2 diabetes mellitus with ketoacidosis without coma: Secondary | ICD-10-CM | POA: Diagnosis not present

## 2021-06-12 DIAGNOSIS — N179 Acute kidney failure, unspecified: Secondary | ICD-10-CM | POA: Diagnosis not present

## 2021-06-12 DIAGNOSIS — I639 Cerebral infarction, unspecified: Secondary | ICD-10-CM | POA: Diagnosis not present

## 2021-06-12 DIAGNOSIS — N1832 Chronic kidney disease, stage 3b: Secondary | ICD-10-CM | POA: Diagnosis not present

## 2021-06-12 DIAGNOSIS — G9341 Metabolic encephalopathy: Secondary | ICD-10-CM | POA: Diagnosis not present

## 2021-06-12 DIAGNOSIS — E1165 Type 2 diabetes mellitus with hyperglycemia: Secondary | ICD-10-CM | POA: Diagnosis not present

## 2021-06-12 LAB — GLUCOSE, CAPILLARY
Glucose-Capillary: 370 mg/dL — ABNORMAL HIGH (ref 70–99)
Glucose-Capillary: 209 mg/dL — ABNORMAL HIGH (ref 70–99)
Glucose-Capillary: 170 mg/dL — ABNORMAL HIGH (ref 70–99)
Glucose-Capillary: 271 mg/dL — ABNORMAL HIGH (ref 70–99)
Glucose-Capillary: 292 mg/dL — ABNORMAL HIGH (ref 70–99)
Glucose-Capillary: 409 mg/dL — ABNORMAL HIGH (ref 70–99)

## 2021-06-12 LAB — BASIC METABOLIC PANEL
Anion gap: 8 (ref 5–15)
BUN: 16 mg/dL (ref 8–23)
CO2: 23 mmol/L (ref 22–32)
Calcium: 8.1 mg/dL — ABNORMAL LOW (ref 8.9–10.3)
Chloride: 105 mmol/L (ref 98–111)
Creatinine, Ser: 1.11 mg/dL — ABNORMAL HIGH (ref 0.44–1.00)
GFR, Estimated: 53 mL/min — ABNORMAL LOW (ref >60)
Glucose, Bld: 196 mg/dL — ABNORMAL HIGH (ref 70–99)
Potassium: 3.8 mmol/L (ref 3.5–5.1)
Sodium: 136 mmol/L (ref 135–145)

## 2021-06-12 LAB — PROTIME-INR
INR: 1.2 (ref 0.8–1.2)
Prothrombin Time: 15 seconds (ref 11.4–15.2)

## 2021-06-12 MED ORDER — INSULIN ASPART 100 UNIT/ML IJ SOLN
0.0000 [IU] | Freq: Three times a day (TID) | INTRAMUSCULAR | Status: DC
  Administered 2021-06-13: 7 [IU] via SUBCUTANEOUS
  Administered 2021-06-13: 11 [IU] via SUBCUTANEOUS
  Administered 2021-06-13: 7 [IU] via SUBCUTANEOUS
  Administered 2021-06-14: 15 [IU] via SUBCUTANEOUS
  Administered 2021-06-14: 3 [IU] via SUBCUTANEOUS
  Administered 2021-06-14: 15 [IU] via SUBCUTANEOUS
  Administered 2021-06-15: 3 [IU] via SUBCUTANEOUS
  Administered 2021-06-15: 11 [IU] via SUBCUTANEOUS

## 2021-06-12 MED ORDER — INSULIN ASPART 100 UNIT/ML IJ SOLN
0.0000 [IU] | Freq: Every day | INTRAMUSCULAR | Status: DC
  Administered 2021-06-12: 5 [IU] via SUBCUTANEOUS
  Administered 2021-06-13: 2 [IU] via SUBCUTANEOUS

## 2021-06-12 MED ORDER — INSULIN GLARGINE-YFGN 100 UNIT/ML ~~LOC~~ SOLN
60.0000 [IU] | Freq: Every day | SUBCUTANEOUS | Status: DC
Start: 2021-06-12 — End: 2021-06-14
  Administered 2021-06-12 – 2021-06-13 (×2): 60 [IU] via SUBCUTANEOUS
  Filled 2021-06-12 (×3): qty 0.6

## 2021-06-12 MED ORDER — WARFARIN SODIUM 5 MG PO TABS
10.0000 mg | ORAL_TABLET | Freq: Once | ORAL | Status: AC
  Administered 2021-06-12: 10 mg via ORAL
  Filled 2021-06-12: qty 2

## 2021-06-12 MED ORDER — WARFARIN - PHARMACIST DOSING INPATIENT: Freq: Every day | Status: DC

## 2021-06-12 NOTE — Progress Notes (Signed)
Patient stable. No complaints during this shift.  Patient and family awaiting authorization for placement.  ?

## 2021-06-12 NOTE — Progress Notes (Signed)
Patient in recliner , noted to have removed her IV from RFA , blood on gown and pillow , changed both and stopped fluids  ?

## 2021-06-12 NOTE — Progress Notes (Addendum)
I connected with  [name] on 06/12/21 by a video enabled telemedicine application and verified that I am speaking with the correct person using two identifiers. ?  ?I discussed the limitations of evaluation and management by telemedicine. The patient expressed understanding and agreed to proceed. ? ?Location of patient: [facility] ?Location of physician: [facility] ? ? ?Subjective: NAEO. Denies any concerns. ? ?ROS: negative except above ? ?Examination ? ?Vital signs in last 24 hours: ?Temp:  [98.3 ?F (36.8 ?C)-99.9 ?F (37.7 ?C)] 98.3 ?F (36.8 ?C) (04/07 0340) ?Pulse Rate:  [76-79] 77 (04/07 0340) ?Resp:  [18-20] 18 (04/07 0340) ?BP: (107-133)/(32-67) 125/51 (04/07 0340) ?SpO2:  [99 %-100 %] 100 % (04/07 0340) ? ?  ?General: Sitting in chair, not in apparent distress ?Neuro: awake, alert, oriented to place and person, thinks its March, follows commands, no evidence of aphasia, PERRLA, EOMI, possible left hemineglect, no antigravity strength in left upper and left lower extremity, antigravity strength without drift in right upper and right lower extremity, decreased sensation to light touch in left side of face arm and leg ? ?Basic Metabolic Panel: ?Recent Labs  ?Lab 06/08/21 ?5643 06/09/21 ?3295 06/10/21 ?1884 06/11/21 ?1660 06/12/21 ?0601  ?NA 140 141 135 138 136  ?K 3.8 3.7 4.3 4.0 3.8  ?CL 109 112* 105 105 105  ?CO2 21* 21* 21* 23 23  ?GLUCOSE 192* 191* 341* 222* 196*  ?BUN 32* 26* '22 14 16  '$ ?CREATININE 1.36* 1.19* 1.25* 1.05* 1.11*  ?CALCIUM 8.7* 8.1* 8.1* 8.5* 8.1*  ?MG  --  2.1  --   --   --   ?PHOS  --   --   --  4.5  --   ? ? ?CBC: ?Recent Labs  ?Lab 06/07/21 ?2014 06/08/21 ?0426 06/09/21 ?6301 06/10/21 ?6010 06/11/21 ?9323  ?WBC 23.3* 24.0* 12.6* 13.4* 13.4*  ?NEUTROABS  --   --  7.1 9.0*  --   ?HGB 10.3* 9.2* 7.7* 7.9* 8.1*  ?HCT 33.3* 29.3* 25.9* 26.1* 27.5*  ?MCV 79.3* 77.9* 83.3 84.7 83.3  ?PLT 606* 499* 333 326 298  ? ? ? ?Coagulation Studies: ?No results for input(s): LABPROT,  INR in the last 72 hours. ? ?Imaging ? MR C spine w and wo contrast 06/11/2021 ? ?MR T spine w and wo contrast 06/11/2021 ? ?ASSESSMENT AND PLAN: 71 year old female who presented with acute ischemic stroke ?  ?Acute ischemic stroke ?Metastatic breast cancer with mets to spine ?Carotid thrombus ?- patient has hypercoagulable state due to malignancy. Therefore, would benefit from anticoagulation ?  ?Recommendations: ?- recommend starting warfarin with goal INR 2-3 ?- Continue asa '81mg'$  daily till goal INR achieved. Once INR 2-3, dc aspirin ?-  DC Plavix ?- obtain BL LE venous US to loom for DVT as patient is in hypercoagulable state and ws down for  sometime before arrival  ?-Recommend follow-up with [name] at [facility] neurology in 3 months after CTA neck ?- I called son to discuss plan but went to voicemail I have discussed my plan with patient and RN at bedside as well as Dr Denton Brick ? -While inpatient, q4 hr neuro checks ?- of note, patient is at increased risk of hemorrhagic conversion with warfarin but its been 5 days since stroke and it will take few days for INR t be therapeutic. Therefore, we can start warfarin today.  ?- STAT head CT and CTA head and neck for any change in neuro exam ?- Dr Denton Brick discussed MR spine findings  with family. They would like to wait for stroke rehab and then see oncology. F/U with oncology asap ?  ?I have spent a total of 36  minutes with the patient reviewing hospital notes,  test results, labs and examining the patient as well as establishing an assessment and plan.  > 50% of time was spent in direct patient care. ?  ?Zeb Comfort ?Epilepsy ?[facility] ?For questions after 5pm please refer to AMION to reach the Neurologist on call ? ?

## 2021-06-12 NOTE — Progress Notes (Signed)
ANTICOAGULATION CONSULT NOTE - Initial Up Consult ?  ?Pharmacy Consult for warfarin dosing  ?Indication: hypercoag state due to malignancy, carotid thrombus. goal INR 2-3 per MD  ?  ?Allergies  ?Allergen Reactions  ? Keflex [Cephalexin] Anaphylaxis  ?  Pt "codes" on this medication.  ? Cogentin [Benztropine] Other (See Comments)  ?  confusion  ? Sulfa Antibiotics Hives  ? Latex Rash  ? ? ?  ?Patient Measurements: ?Last Weight  Most recent update: 06/08/2021  4:38 AM  ? ? Weight  ?83.9 kg (184 lb 15.5 oz)  ?      ? ?  ? ?Body mass index is 31.75 kg/m?. ?[name] ? ?             ?Temp: 98.4 ?F (36.9 ?C) (04/07 1242) ?BP: 132/61 (04/07 1242) ?Pulse Rate: 75 (04/07 1242) ? ?Labs: ?Recent Labs  ?  06/10/21 ?0254 06/11/21 ?2706 06/12/21 ?0601 06/12/21 ?1354  ?HGB 7.9* 8.1*  --   --   ?HCT 26.1* 27.5*  --   --   ?PLT 326 298  --   --   ?LABPROT  --   --   --  15.0  ?INR  --   --   --  1.2  ?CREATININE 1.25* 1.05* 1.11*  --   ? ? ?Estimated Creatinine Clearance: 48 mL/min (A) (by C-G formula based on SCr of 1.11 mg/dL (H)). ?  ?  ?Medications:  ?Medications Prior to Admission  ?Medication Sig Dispense Refill Last Dose  ? carbidopa-levodopa (SINEMET IR) 25-100 MG tablet Take 1 tablet by mouth 3 (three) times daily. 90 tablet 5 Past Week  ? chlorproMAZINE (THORAZINE) 50 MG tablet Take 100 mg by mouth at bedtime.    Past Week  ? citalopram (CELEXA) 40 MG tablet Take 40 mg by mouth daily.   Past Week  ? GVOKE PFS 1 MG/0.2ML SOSY Inject 1 mg into the skin See admin instructions. Inject if Blood sugar <50   unk  ? levothyroxine (SYNTHROID) 88 MCG tablet Take 88 mcg by mouth daily before breakfast.   Past Week  ? metFORMIN (GLUCOPHAGE-XR) 500 MG 24 hr tablet Take 1,000 mg by mouth daily.   Past Week  ? polyethylene glycol powder (GLYCOLAX/MIRALAX) powder Take 17 g by mouth daily.    Past Week  ? propranolol (INDERAL) 20 MG tablet Take 20 mg by mouth 3 (three) times daily.   Past Week  ? simvastatin (ZOCOR) 40 MG tablet Take 40  mg by mouth at bedtime.   Past Week  ? allopurinol (ZYLOPRIM) 100 MG tablet Take 100 mg by mouth daily. (Patient not taking: Reported on 06/07/2021)   Not Taking  ? aspirin EC 81 MG tablet Take 81 mg by mouth daily. (Patient not taking: Reported on 06/07/2021)   Not Taking  ? ferrous sulfate 325 (65 FE) MG tablet Take 325 mg by mouth daily with breakfast. (Patient not taking: Reported on 06/07/2021)   Not Taking  ? insulin degludec (TRESIBA) 100 UNIT/ML FlexTouch Pen Inject 50 Units into the skin at bedtime.  (Patient not taking: Reported on 06/07/2021)   Not Taking  ? mirtazapine (REMERON) 15 MG tablet Take 15 mg by mouth at bedtime. (Patient not taking: Reported on 06/07/2021)   Not Taking  ? niacin 500 MG CR capsule Take 500 mg by mouth at bedtime. (Patient not taking: Reported on 06/07/2021)   Not Taking  ? nitrofurantoin (MACRODANTIN) 100 MG capsule Take 100 mg by mouth daily. (Patient not  taking: Reported on 06/07/2021)   Not Taking  ? NOVOLOG MIX 70/30 FLEXPEN (70-30) 100 UNIT/ML FlexPen Inject 30 Units into the skin 2 (two) times daily with a meal.  (Patient not taking: Reported on 06/07/2021)   Not Taking  ? omeprazole (PRILOSEC) 40 MG capsule Take 40 mg by mouth daily. (Patient not taking: Reported on 06/07/2021)   Not Taking  ? temazepam (RESTORIL) 15 MG capsule Take 15 mg by mouth at bedtime. (Patient not taking: Reported on 06/07/2021)   Not Taking  ? tiZANidine (ZANAFLEX) 4 MG tablet Take 8 mg by mouth 2 (two) times daily. (Patient not taking: Reported on 06/07/2021)   Not Taking  ? traMADol (ULTRAM) 50 MG tablet Take 1 tablet (50 mg total) by mouth every 12 (twelve) hours as needed. (Patient not taking: Reported on 06/07/2021) 15 tablet 0 Not Taking  ? ?Scheduled:  ? aspirin EC  81 mg Oral Daily  ? atorvastatin  40 mg Oral q1800  ? carbidopa-levodopa  1 tablet Oral TID  ? Chlorhexidine Gluconate Cloth  6 each Topical Daily  ? chlorproMAZINE  100 mg Oral QHS  ? citalopram  40 mg Oral Daily  ? feeding supplement  237 mL Oral  BID BM  ? insulin aspart  0-15 Units Subcutaneous TID WC  ? insulin aspart  0-5 Units Subcutaneous QHS  ? insulin aspart  14 Units Subcutaneous TID WC  ? insulin glargine-yfgn  50 Units Subcutaneous Q24H  ? levothyroxine  88 mcg Oral Q0600  ? propranolol  10 mg Oral TID  ? ?Infusions:  ?PRN: acetaminophen **OR** acetaminophen, dextrose, LORazepam ?Anti-infectives (From admission, onward)  ? ? Start     Dose/Rate Route Frequency Ordered Stop  ? 06/10/21 1500  vancomycin (VANCOCIN) IVPB 1000 mg/200 mL premix  Status:  Discontinued       ? 1,000 mg ?200 mL/hr over 60 Minutes Intravenous Every 24 hours 06/09/21 1413 06/12/21 1011  ? 06/09/21 1500  vancomycin (VANCOREADY) IVPB 1500 mg/300 mL       ? 1,500 mg ?150 mL/hr over 120 Minutes Intravenous  Once 06/09/21 1406 06/09/21 1803  ? ?  ? ? ?Goal of Therapy:  ?INR 2-3 ?Monitor platelets by anticoagulation protocol: Yes ? ? ? Prior to Admission Warfarin Dosing: ? ANZAL BARTNICK did not take warfarin PTA.  ?   ? Admit INR was 1.2 ?Lab Results  ?Component Value Date  ? INR 1.2 06/12/2021  ? INR 1.07 08/24/2016  ? INR 1.05 06/07/2016  ? ? ?Assessment: ?Margaret Mcdaniel a 71 y.o. female requires anticoagulation with warfarin for the indication of   hypercoag state due to malignancy, carotid thrombus. . Warfarin will be initiated inpatient following pharmacy protocol per pharmacy consult. Patient most recent blood work is as follows: ? ?  Latest Ref Rng & Units 06/11/2021  ?  6:51 AM 06/10/2021  ?  5:42 AM 06/09/2021  ?  6:05 AM  ?CBC  ?WBC 4.0 - 10.5 K/uL 13.4   13.4   12.6    ?Hemoglobin 12.0 - 15.0 g/dL 8.1   7.9   7.7    ?Hematocrit 36.0 - 46.0 % 27.5   26.1   25.9    ?Platelets 150 - 400 K/uL 298   326   333    ? ? ? ?Plan: ?Warfarin '10mg'$  po x 1 dose tonight ?Monitor CBC daily with am labs   ?Monitor INR daily ?Monitor for signs and symptoms of bleeding  ? ?Donna Christen Khadejah Son, PharmD, MBA,  BCGP ?Clinical Pharmacist ? ? ? ?

## 2021-06-12 NOTE — Progress Notes (Signed)
Inpatient Rehab Admissions Coordinator:  ? ?Insurance has denied request for CIR.  I notified family and TOC.  Will sign off at this time.   ? ?Shann Medal, PT, DPT ?Admissions Coordinator ?[phone] ?06/12/21  ?12:49 PM ? ?

## 2021-06-12 NOTE — TOC Progression Note (Addendum)
Transition of Care (TOC) - Progression Note  ? ? ?Patient Details  ?Name: Margaret Mcdaniel ?MRN: [id number] ?Date of Birth: November 29, 1950 ? ?Transition of Care (TOC) CM/SW Contact  ?Iona Beard, LCSWA ?Phone Number: ?06/12/2021, 2:51 PM ? ?Clinical Narrative:    ?CSW updated that pts insurance has denied CIR placement for pt. CSW met with pts family to speak about other options. At this time family is agreeable to referring pt for [facility]. They are only agreeable to [facility] placement. CSW sent referral over via the [facility]. CSW awaiting an answer from facility. TOC to follow. PASRR has been started at this time.  ? ?CSW updated that [facility] can accept pt for admission on Monday pending insurance auth. CSW started insurance auth with [facility]. ? ?Expected Discharge Plan: [place] ?Barriers to Discharge: Continued Medical Work up ? ?Expected Discharge Plan and Services ?Expected Discharge Plan: [place] ?In-house Referral: Clinical Social Work ?  ?  ?Living arrangements for the past 2 months: [place] ?                ?  ?  ?  ?  ?  ?  ?  ?  ?  ?  ? ? ?Social Determinants of Health (SDOH) Interventions ?  ? ?Readmission Risk Interventions ? ?  06/08/2021  ? 11:26 AM  ?Readmission Risk Prevention Plan  ?Transportation Screening Complete  ?[place] or Home Care Consult Complete  ?Social Work Consult for [place] Planning/Counseling Complete  ?Palliative Care Screening Not Applicable  ?Medication Review Press photographer) Complete  ? ? ?

## 2021-06-12 NOTE — Progress Notes (Signed)
Physical Therapy Treatment ?Patient Details ?Name: Margaret Mcdaniel ?MRN: [id number] ?DOB: 01/09/1951 ?Today's Date: 06/12/2021 ? ? ?History of Present Illness Margaret Mcdaniel is a 71 y.o. female with medical history significant for diabetes mellitus, Parkinson's disease, COPD respiratory failure on 2 L, CKD 3, breast cancer.     Patient was brought to the ED from home, reports of altered mental status and high blood sugar.  Patient's son found patient on the floor.  Patient's son talked to patient yesterday at about 11 in the morning and this was the last time patient was here was seen normal.  Today family called patient, patient did not appear to be normal on the phone, and her breathing was labored.  They went over to check on patient and saw patient on the floor, she got up on her knees but could not stand up.    EMS was called, blood sugar read high.  Patient was on room air, her O2 sats were 82%.  At the time of my evaluation, patient is altered, unable to provide any history.  Daughter and son at bedside, they report that in January patient blood sugars were low in the 40s, medications were gradually reduced and stopped.  She was on [place], Ozempic.  She is currently still on metformin. ? ?  ?PT Comments  ? ? Patient demonstrates increased strength LUE for assisting with pulling self to sitting up at bedside, once seated tends to lean laterally to the right and backwards due to poor trunk control, increased strength for lifting LLE against gravity while completing exercises, but has difficulty attempting to dorsiflex left foot due to weakness.  Patient able to grip RW with left hand after verbal/tactile cueing and limited to a few slow labored side steps with Max tactile assist to move/shuffled left foot during transfer to chair.  Patient tolerated sitting up in chair after therapy and able to use LUE to grip, pick up, bring cup of water to mouth and back onto tray.  Patient will benefit from continued skilled  physical therapy in hospital and recommended venue below to increase strength, balance, endurance for safe ADLs and gait.  ?   ?Recommendations for follow up therapy are one component of a multi-disciplinary discharge planning process, led by the attending physician.  Recommendations may be updated based on patient status, additional functional criteria and insurance authorization. ? ?Follow Up Recommendations ? Acute inpatient rehab (3hours/day) ?  ?  ?Assistance Recommended at Discharge Intermittent Supervision/Assistance  ?Patient can return home with the following A lot of help with bathing/dressing/bathroom;A lot of help with walking and/or transfers;Help with stairs or ramp for entrance;Assistance with cooking/housework;Assist for transportation ?  ?Equipment Recommendations ? Rolling walker (2 wheels);None recommended by PT  ?  ?Recommendations for Other Services   ? ? ?  ?Precautions / Restrictions Precautions ?Precautions: Fall ?Restrictions ?Weight Bearing Restrictions: No  ?  ? ?Mobility ? Bed Mobility ?Overal bed mobility: Needs Assistance ?Bed Mobility: Supine to Sit ?  ?  ?Supine to sit: Mod assist ?  ?  ?General bed mobility comments: demonstrates increased left hand grip strength for pulling self to sitting with Mod assist ?  ? ?Transfers ?Overall transfer level: Needs assistance ?Equipment used: Rolling walker (2 wheels) ?Transfers: Sit to/from Stand, Bed to chair/wheelchair/BSC ?Sit to Stand: Min assist, Mod assist ?  ?Step pivot transfers: Mod assist ?  ?  ?  ?General transfer comment: has diffiuclty advancing LLE due to weakness and mild neglect ?  ? ?  Ambulation/Gait ?Ambulation/Gait assistance: Max assist ?Gait Distance (Feet): 5 Feet ?Assistive device: Rolling walker (2 wheels) ?Gait Pattern/deviations: Decreased step length - right, Decreased step length - left, Decreased stance time - left, Decreased stride length, Decreased dorsiflexion - left, Shuffle ?Gait velocity: slow ?  ?  ?General  Gait Details: limited to a few slow labored unsteady side steps with poor return for advancing LLE due to weakness requiring Max tactile cueing to assist with shuffling left foot ? ? ?Stairs ?  ?  ?  ?  ?  ? ? ?Wheelchair Mobility ?  ? ?Modified Rankin (Stroke Patients Only) ?  ? ? ?  ?Balance Overall balance assessment: Needs assistance ?Sitting-balance support: Feet supported, No upper extremity supported ?Sitting balance-Leahy Scale: Poor ?Sitting balance - Comments: fair/poor seated at EOB ?Postural control: Right lateral lean, Posterior lean ?Standing balance support: Reliant on assistive device for balance, During functional activity, Bilateral upper extremity supported ?Standing balance-Leahy Scale: Poor ?Standing balance comment: fair/poor static standing using RW, poor dynamic standing using RW ?  ?  ?  ?  ?  ?  ?  ?  ?  ?  ?  ?  ? ?  ?Cognition Arousal/Alertness: Awake/alert ?Behavior During Therapy: Flat affect, WFL for tasks assessed/performed ?Overall Cognitive Status: Within Functional Limits for tasks assessed ?  ?  ?  ?  ?  ?  ?  ?  ?  ?  ?  ?  ?  ?  ?  ?  ?  ?  ?  ? ?  ?Exercises General Exercises - Lower Extremity ?Long Arc Quad: Seated, AROM, Strengthening, Both, AAROM, 15 reps ?Hip Flexion/Marching: Seated, AROM, AAROM, Strengthening, Both, 15 reps ?Toe Raises: Seated, AROM, Strengthening, 10 reps, Right ?Heel Raises: Seated, AROM, AAROM, Strengthening, Both, 10 reps ? ?  ?General Comments   ?  ?  ? ?Pertinent Vitals/Pain Pain Assessment ?Pain Assessment: No/denies pain  ? ? ?Home Living   ?  ?  ?  ?  ?  ?  ?  ?  ?  ?   ?  ?Prior Function    ?  ?  ?   ? ?PT Goals (current goals can now be found in the care plan section) Acute Rehab PT Goals ?Patient Stated Goal: return home after rehab ?PT Goal Formulation: With patient ?Time For Goal Achievement: 06/24/21 ?Potential to Achieve Goals: Good ?Progress towards PT goals: Progressing toward goals ? ?  ?Frequency ? ? ? Min 5X/week ? ? ? ?  ?PT Plan  Current plan remains appropriate  ? ? ?Co-evaluation   ?  ?  ?  ?  ? ?  ?AM-PAC PT "6 Clicks" Mobility   ?Outcome Measure ? Help needed turning from your back to your side while in a flat bed without using bedrails?: A Lot ?Help needed moving from lying on your back to sitting on the side of a flat bed without using bedrails?: A Lot ?Help needed moving to and from a bed to a chair (including a wheelchair)?: A Lot ?Help needed standing up from a chair using your arms (e.g., wheelchair or bedside chair)?: A Lot ?Help needed to walk in hospital room?: A Lot ?Help needed climbing 3-5 steps with a railing? : Total ?6 Click Score: 11 ? ?  ?End of Session Equipment Utilized During Treatment: Oxygen ?Activity Tolerance: Patient tolerated treatment well;Patient limited by fatigue ?Patient left: in chair;with call bell/phone within reach;with family/visitor present ?Nurse Communication: Mobility status ?PT Visit Diagnosis: Other  abnormalities of gait and mobility (R26.89);Unsteadiness on feet (R26.81);Muscle weakness (generalized) (M62.81) ?  ? ? ?Time: 2395-3202 ?PT Time Calculation (min) (ACUTE ONLY): 30 min ? ?Charges:  $Therapeutic Exercise: 8-22 mins ?$Therapeutic Activity: 8-22 mins          ?          ? ?11:30 AM, 06/12/21 ?Lonell Grandchild, MPT ?Physical Therapist with [place] [facility] ?[phone] office ?[phone] mobile phone ? ? ?

## 2021-06-12 NOTE — Care Management Important Message (Signed)
Important Message ? ?Patient Details  ?Name: Margaret Mcdaniel ?MRN: [id number] ?Date of Birth: [birth year] ? ? ?Medicare Important Message Given:  Yes ? ?Reviewed Medicare IM with Adline Potter, [place] room phone [phone]).  Copy of Medicare IM sent securely to email address on file: [email or web address]  ? ? ?Dannette Barbara ?06/12/2021, 2:41 PM ?

## 2021-06-12 NOTE — [place] FL2 (Signed)
?[place] MEDICAID FL2 LEVEL OF CARE SCREENING TOOL  ?  ? ?IDENTIFICATION  ?Patient Name: ?Margaret Mcdaniel Birthdate: [birth year] Sex: female Admission Date (Current Location): ?06/07/2021  ?[place] and [place] Number: ? [place] and Address:  ?[facility] [address] [place] ?     Provider Number: ?[id number]  ?Attending Physician Name and Address:  ?Roxan Hockey, MD ? Relative Name and Phone Number:  ?  ?   ?Current Level of Care: ?Hospital Recommended Level of Care: ?[place] Prior Approval Number: ?  ? ?Date Approved/Denied: ?  PASRR Number: ?  ? ?Discharge Plan: ?SNF ?  ? ?Current Diagnoses: ?Patient Active Problem List  ? Diagnosis Date Noted  ? Gram-positive cocci bacteremia 06/09/2021  ? Acute CVA (cerebrovascular accident) ([place]) 06/08/2021  ? DKA (diabetic ketoacidosis) ([place]) 06/07/2021  ? Acute metabolic encephalopathy 12/30/8525  ? AKI (acute kidney injury) ([place]) 06/07/2021  ? Leukocytosis 06/07/2021  ? Malignant neoplasm of left female breast ([place])   ? Encephalopathy 08/25/2016  ? COPD (chronic obstructive pulmonary disease) ([place])   ? Secondary Parkinson disease ([place]) 06/29/2016  ? RLS (restless legs syndrome) 06/29/2016  ? Fever 06/21/2016  ? Pressure injury of skin 06/13/2016  ? Pyelonephritis 06/08/2016  ? Hypothyroidism 06/08/2016  ? Sepsis ([place]) 06/08/2016  ? CKD (chronic kidney disease) stage 3, GFR 30-59 ml/min (HCC) 06/08/2016  ? Uncontrolled type 2 diabetes mellitus with hyperglycemia, with long-term current use of insulin ([place]) 06/08/2016  ? Breast cancer metastasized to axillary lymph node, left ([place]) 04/14/2016  ? ? ?Orientation RESPIRATION BLADDER Height & Weight   ?  ?Self, Time, Situation, Place ? O2 (1L) Incontinent, External catheter Weight: 184 lb 15.5 oz (83.9 kg) ?Height:  '5\' 4"'$  (162.6 cm)  ?BEHAVIORAL SYMPTOMS/MOOD NEUROLOGICAL BOWEL NUTRITION STATUS  ?    Continent Diet ([place] modified)  ?AMBULATORY STATUS COMMUNICATION OF  NEEDS Skin   ?Extensive Assist Verbally Skin abrasions (upper right foot) ?  ?  ?  ?    ?     ?     ? ? ?Personal Care Assistance Level of Assistance  ?Bathing, Feeding, Dressing Bathing Assistance: Limited assistance ?Feeding assistance: Independent ?  ?   ? ?Functional Limitations Info  ?Sight, Hearing, Speech Sight Info: Impaired ?Hearing Info: Adequate ?Speech Info: Adequate  ? ? ?SPECIAL CARE FACTORS FREQUENCY  ?PT (By licensed PT), OT (By licensed OT)   ?  ?PT Frequency: 5 times weekly ?OT Frequency: 5 times weekly ?  ?  ?  ?   ? ? ?Contractures Contractures Info: Not present  ? ? ?Additional Factors Info  ?Code Status, Allergies, Psychotropic Code Status Info: DNR ?Allergies Info: Keflex (Cephalexin), Cogentin (Benztropine), Sulfa Antibiotics, Latex ?Psychotropic Info: citalopram (CELEXA) 10 MG tablet ?  ?  ?   ? ?Current Medications (06/12/2021):  This is the current hospital active medication list ?Current Facility-Administered Medications  ?Medication Dose Route Frequency Provider Last Rate Last Admin  ? acetaminophen (TYLENOL) tablet 650 mg  650 mg Oral Q6H PRN Emokpae, Ejiroghene E, MD   650 mg at 06/11/21 1635  ? Or  ? acetaminophen (TYLENOL) suppository 650 mg  650 mg Rectal Q6H PRN Emokpae, Ejiroghene E, MD   650 mg at 06/08/21 0051  ? aspirin EC tablet 81 mg  81 mg Oral Daily Lora Havens, MD   81 mg at 06/12/21 7824  ? atorvastatin (LIPITOR) tablet 40 mg  40 mg Oral q1800 Roxan Hockey, MD  40 mg at 06/11/21 1830  ? carbidopa-levodopa (SINEMET IR) 25-100 MG per tablet immediate release 1 tablet  1 tablet Oral TID Murlean Iba, MD   1 tablet at 06/12/21 2671  ? Chlorhexidine Gluconate Cloth 2 % PADS 6 each  6 each Topical Daily Murlean Iba, MD   6 each at 06/12/21 2458  ? chlorproMAZINE (THORAZINE) tablet 100 mg  100 mg Oral QHS Johnson, Clanford L, MD   100 mg at 06/11/21 2227  ? citalopram (CELEXA) tablet 40 mg  40 mg Oral Daily Johnson, Clanford L, MD   40 mg at 06/12/21 0926   ? dextrose 50 % solution 0-50 mL  0-50 mL Intravenous PRN Emokpae, Ejiroghene E, MD      ? feeding supplement (ENSURE ENLIVE / ENSURE PLUS) liquid 237 mL  237 mL Oral BID BM Johnson, Clanford L, MD   237 mL at 06/12/21 0932  ? insulin aspart (novoLOG) injection 0-15 Units  0-15 Units Subcutaneous TID WC Wynetta Emery, Clanford L, MD   8 Units at 06/12/21 1208  ? insulin aspart (novoLOG) injection 0-5 Units  0-5 Units Subcutaneous QHS Murlean Iba, MD   2 Units at 06/11/21 2227  ? insulin aspart (novoLOG) injection 14 Units  14 Units Subcutaneous TID WC Wynetta Emery, Clanford L, MD   14 Units at 06/12/21 1208  ? insulin glargine-yfgn (SEMGLEE) injection 50 Units  50 Units Subcutaneous Q24H Roxan Hockey, MD   50 Units at 06/12/21 0998  ? levothyroxine (SYNTHROID) tablet 88 mcg  88 mcg Oral Q0600 Irwin Brakeman L, MD   88 mcg at 06/12/21 0540  ? LORazepam (ATIVAN) injection 1 mg  1 mg Intravenous Q4H PRN Emokpae, Ejiroghene E, MD      ? propranolol (INDERAL) tablet 10 mg  10 mg Oral TID Wynetta Emery, Clanford L, MD   10 mg at 06/12/21 3382  ? ? ? ?Discharge Medications: ?Please see discharge summary for a list of discharge medications. ? ?Relevant Imaging Results: ? ?Relevant Lab Results: ? ? ?Additional Information ?SSN: [id number] ? ?Iona Beard, LCSWA ? ? ? ? ?

## 2021-06-12 NOTE — Progress Notes (Addendum)
?PROGRESS NOTE ? ? ?Margaret Mcdaniel  GUY:[id number] DOB: 08/07/1950 DOA: 06/07/2021 ?PCP: Margaret Squibb, MD  ? ?Chief Complaint  ?Patient presents with  ? Hyperglycemia  ? ?Level of care: Telemetry ? ?Brief Admission History:  ?71 y.o. female with medical history significant for diabetes mellitus, Parkinson's disease, COPD respiratory failure on 2 L, CKD 3, breast cancer. ?  ?Patient was brought to the ED from home, reports of altered mental status and high blood sugar.  Patient's son found patient on the floor.  Patient's son talked to patient yesterday at about 11 in the morning and this was the last time patient was here was seen normal.  Today family called patient, patient did not appear to be normal on the phone, and her breathing was labored.  They went over to check on patient and saw patient on the floor, she got up on her knees but could not stand up.   EMS was called, blood sugar read high.  Patient was on room air, her O2 sats were 82%.  At the time of my evaluation, patient is altered, unable to provide any history.  Daughter and son at bedside, they report that in January patient blood sugars were low in the 40s, medications were gradually reduced and stopped.  She was on [place], Ozempic.  She is currently still on metformin. ?  ?ED Course: Temperature 97.7.  Tachycardic heart rate 190 117.  Respirate rate 18-32.  Blood pressure systolic 563O to 756E.  O2 Sats 100% on 2 L.  Blood sugar 785, anion gap of 20, serum bicarb of 15.  Leukocytosis of 23.3.  Beta Hydroxybutyric acid 2.1. ?1.8 L bolus given, hospitalist to admit for DKA. ? ? ?06/08/2021 ?MRI reveals: Multiple small patchy acute cortical/subcortical infarcts within the right cerebral hemisphere affecting the posterior frontal lobe, parietal lobe, lateral occipital lobe and posterior temporal lobe.  These infarcts affect the posterior right MCA vascular territory, right MCA/PCA watershed territory and right MCA/ACA watershed territory.  ? ?Neurology  consultation requested.  Stroke orderset initiated. ?Pt transitioned off IV insulin to subcutaneous insulin.   ? ?06/09/2021:  Pt is going to need extensive rehabilitation.  PT/OT/SLP evaluations. Spoke with family who is agreeable to rehab.  Held off on CTA due to concerns about using contrast dye with poor renal function. Continue to hydrate today.  ? ?  ?Assessment and Plan: ? ?-Acute stroke--multiple small patchy acute cortical/subcortical infarcts within the right cerebellar hemisphere ?CTA neck from 06/10/2021 shows-[place] adherent thrombus in the distal right common carotid artery, extending into the right ECA, causing approximately 60% stenosis of the right ECA. No significant stenosis of the right ICA. ?-Initially treated with aspirin and Plavix starting 06/10/2021, after further conversations between [name] the neurologist and Dr. Leonie Mcdaniel the stroke neurologist----decision was made to transition to Coumadin on 06/12/2021 ?-Continue aspirin 81 mg until INR is therapeutic at that point aspirin can be discontinued ?-Plavix will be discontinued as of 06/12/2021 ?-Monitor closely for possible hemorrhagic conversion/brain bleed ?-LDL 67, HDL 29 total cholesterol 135--give Lipitor 40 mg daily... Even if her lipid panel is within desired limits, patient should still take Lipitor/Statin for it's Pleiotropic effects (beyond cholesterol lowering benefits) ?-Echo with EF of 70 to 65% and grade 1 diastolic dysfunction with normal pulmonary artery pressures ?--PT /OT eval appreciated recommends CIR, insurance company denied CIR/inpatient rehab ?-Plan will be for SNF rehab pending insurance approval ? ?Metastatic breast cancer- ?CTA neck from 06/10/2021 showed Sclerosis of C5 and the visualized  thoracic spine (T1-T4), concerning for a neoplastic process, possibly metastatic breast ?cancer given the patient's history of prior left-sided breast cancer that required mastectomy and chemotherapy ?- additional sclerotic foci in the right  third rib. ? MRI of the cervical and thoracic spine is consistent with metastatic disease without evidence of cord compression ?-At this time patient and family would like to defer further oncology work-up ?-Once patient recovers from her stroke, they may consider oncology consult to discuss further work-up and possible palliative chemo or radiation therapy options ?--May also need PET scan and oncology follow-up as outpatient after resolution of acute neuro issues ?-Again patient and family at this point would like to hold off on further malignancy work-up or oncology interventions until patient makes meaningful recovery from her stroke ? ?* DKA (diabetic ketoacidosis)  ?Patient met criteria for DKA on admission ?-DKA pathophysiology resolved patient has been transitioned off IV insulin back to subcu insulin at this time ?-A1c 10.3 reflecting uncontrolled DM with hyperglycemia PTA ?-Persistent hyperglycemia noted ?-Increase Lantus insulin to 60 units nightly ?-Sliding scale adjusted to more aggressive ? ?Leukocytosis ?WBC 23.3, chronically elevated, last check on file 3 to 4 years ago, persistent leukocytosis ranging from 11-18..  Chest x-ray clear.  Afebrile.  At this time no focus of infection identified. ?-May need to follow-up with hem/onc on discharge ?-WBC appears closer to patient's baseline at this time ? ?Acute metabolic encephalopathy ?Acute metabolic encephalopathy with baseline Parkinson's disease.  With superimposed acute stroke ?-Mentation has improved significantly ? ?Staph epi bacteremia ?-- Blood cultures from 06/08/2021 2/2 bottles positive ?-- Pt has severe anaphylaxis reaction to cephalexin ?-This may be a contaminant , ?-Repeat blood cultures from 06/10/2021 negative to date okay to discontinue vancomycin  ? ?AKI (acute kidney injury) ([place]) on CKD 3B ?AKI on CKD 3A-B.  Creatinine 1.8, baseline to 1.3. ?-Creatinine down to 1.0 with hydration ?-Patient had CTA neck on 06/10/2021--- monitor renal  function ? ?COPD (chronic obstructive pulmonary disease) ([place]) ?Stable. ?Continue current regimen ? ?Secondary Parkinson disease ([place]) ?--resume home medications  ? ? anemia--- ??  Acute versus acute on chronic --Hgb currently around 8 ?-No recent baseline available ?-No evidence of ongoing bleeding per se ?-Continue to monitor closely ? ?Social/ethics----plan of care and advanced directives discussed with patient and family at bedside ?-They request DNR/DNI status ?-Family would like to avoid heroic/overtly aggressive/invasive procedures at this time ?-They would like patient to attempt rehab and hopefully get some meaningful improvement/recovery from her stroke ? ? ?DVT prophylaxis: SCDs ?Code Status: Full  ?Family Communication: Discussed with son Gaspar Bidding and daughter-in-law [name] ?Disposition: --PT /OT eval appreciated recommends CIR, insurance company denied CIR/inpatient rehab ?-Plan will be for SNF rehab pending insurance approval ?Status is: Inpatient ?Remains inpatient appropriate because: acute stroke treatment  ?  ?Consultants:  ?Neurology  ?Procedures:  ?MRI/MRA brain ?CTA neck ?MRI thoracic and C-spine ?Antimicrobials:  ?-Vancomycin stopped on 06/12/2021 ?  ?Subjective: ? ?-Patient's son Gaspar Bidding and patient's daughter-in-law [name] visited ?-Patient's IV accidentally came out ?-Appetite is not great, left-sided weakness persist ?-Patient remains coherent, calm and cooperative---- ?-no significant behavioral problems ? ?Objective: ?Vitals:  ? 06/11/21 1834 06/11/21 2211 06/12/21 0340 06/12/21 1242  ?BP:  107/67 (!) 125/51 132/61  ?Pulse:  76 77 75  ?Resp:  _0 ?Temp: 98.9 ?F (37.2 ?C) 99.5 ?F (37.5 ?C) 98.3 ?F (36.8 ?C) 98.4 ?F (36.9 ?C)  ?TempSrc: Axillary Oral    ?SpO2:  99% 100% 100%  ?Weight:      ?  Height:      ? ? ?Intake/Output Summary (Last 24 hours) at 06/12/2021 1648 ?Last data filed at 06/12/2021 1300 ?Gross per 24 hour  ?Intake 1080 ml  ?Output 700 ml  ?Net 380 ml  ? ? ?Filed Weights  ? 06/07/21  1942 06/08/21 0031 06/08/21 0300  ?Weight: 91 kg 83.8 kg 83.9 kg  ? ?Examination: ? ? ?Physical Exam ? ?Gen:- Awake Alert, in no acute distress  ?HEENT:- [place].AT, No sclera icterus ?Neck-Supple Neck,No JVD,.

## 2021-06-12 NOTE — Progress Notes (Signed)
Speech Language Pathology Treatment: Cognitive-Linquistic  ?Patient Details ?Name: Margaret Mcdaniel ?MRN: [id number] ?DOB: 28-May-1950 ?Today's Date: 06/12/2021 ?Time: 0017-4944 ?SLP Time Calculation (min) (ACUTE ONLY): 23 min ? ?Assessment / Plan / Recommendation ?Clinical Impression ? Ongoing speech/language therapy provided today; Pt presents today with significant improvement overall. Dysarthria has almost completely resolved and Pt is very alert and demonstrated no difficulty remaining awake for session. She answered questions regarding her current state, reporting that she had a stroke and it affected her Left side. Pt demonstrated no difficulty with naming (further note no aphasia), however continue to note some suspected left neglect and difficulty identifying things/objects on that side. She continues to demonstrate some cognitive impairment and difficulty with attention (however attention is also significantly improved since eval). Note Pt did have baseline cognitive deficits so acute change in cognition is difficult to assess. She will benefit from a full more in depth cognitive assessment at CIR with treatment as indicated at that location for cognition and attention. ST will continue to follow acutely. Goals updated to reflect Pt's improvement since evaluation ? ?SLP called son, Aaron Edelman and spoke with him via phone; educated him of noted improvement and change/update in ST  recommendation to CIR. ?  ?HPI  Margaret Mcdaniel is a 71 y.o. female with medical history significant for diabetes mellitus, Parkinson's disease, COPD respiratory failure on 2 L, CKD 3, breast cancer.     Patient was brought to the ED from home, reports of altered mental status and high blood sugar.  Patient's son found patient on the floor.  Patient's son talked to patient yesterday at about 11 in the morning and this was the last time patient was here was seen normal.  Today family called patient, patient did not appear to be normal on the  phone, and her breathing was labored.  They went over to check on patient and saw patient on the floor, she got up on her knees but could not stand up.    EMS was called, blood sugar read high.  Patient was on room air, her O2 sats were 82%.  At the time of my evaluation, patient is altered, unable to provide any history.  Daughter and son at bedside, they report that in January patient blood sugars were low in the 40s, medications were gradually reduced and stopped.  She was on [place], Ozempic.  She is currently still on metformin. ?  ?   ?SLP Plan ? Goals updated ? ?  ?  ?Recommendations for follow up therapy are one component of a multi-disciplinary discharge planning process, led by the attending physician.  Recommendations may be updated based on patient status, additional functional criteria and insurance authorization. ?  ? ?Recommendations  ? Originally recommended Skilled care, however, secondary to spontaneous improvement and significant improved alertness, echo PT recommendation for CIR.  ?   ?    ?   ? ? ? ? Oral Care Recommendations: Oral care BID ?Follow Up Recommendations: Acute inpatient rehab (3hours/day) ?Assistance recommended at discharge: Frequent or constant Supervision/Assistance ?SLP Visit Diagnosis: Attention and concentration deficit;Cognitive communication deficit (R41.841) ?Attention and concentration deficit following: Cerebral infarction ?Plan: Goals updated ? ? ? ? ?  ?  ? ?Aengus Sauceda H. Izora Ribas MA, CCC-SLP ?Speech Language Pathologist ? ?Wende Bushy ? ?06/12/2021, 10:13 AM ?

## 2021-06-12 NOTE — Progress Notes (Signed)
RE: Margaret Mcdaniel ?Date: [id number] ?DOB: 1950/11/09 ? ?MUST ID: [id number] ? ?To whom it may concern:  ? ?Please be advised that above patient will require short term nursing home stay. Anticipated 30 days or less rehabilitation and strengthening. The plan is to return home.  ?

## 2021-06-12 NOTE — Plan of Care (Signed)
?  Problem: Education: ?Goal: Knowledge of General Education information will improve ?Description: Including pain rating scale, medication(s)/side effects and non-pharmacologic comfort measures ?Outcome: Progressing ?  ?Problem: Health Behavior/Discharge Planning: ?Goal: Ability to manage health-related needs will improve ?Outcome: Progressing ?  ?Problem: Clinical Measurements: ?Goal: Ability to maintain clinical measurements within normal limits will improve ?Outcome: Progressing ?Goal: Will remain free from infection ?Outcome: Progressing ?Goal: Diagnostic test results will improve ?Outcome: Progressing ?Goal: Respiratory complications will improve ?Outcome: Progressing ?Goal: Cardiovascular complication will be avoided ?Outcome: Progressing ?  ?Problem: Activity: ?Goal: Risk for activity intolerance will decrease ?Outcome: Progressing ?  ?Problem: Nutrition: ?Goal: Adequate nutrition will be maintained ?Outcome: Progressing ?  ?Problem: Coping: ?Goal: Level of anxiety will decrease ?Outcome: Progressing ?  ?Problem: Elimination: ?Goal: Will not experience complications related to bowel motility ?Outcome: Progressing ?Goal: Will not experience complications related to urinary retention ?Outcome: Progressing ?  ?Problem: Pain Managment: ?Goal: General experience of comfort will improve ?Outcome: Progressing ?  ?Problem: Safety: ?Goal: Ability to remain free from injury will improve ?Outcome: Progressing ?  ?Problem: Skin Integrity: ?Goal: Risk for impaired skin integrity will decrease ?Outcome: Progressing ?  ?Problem: Education: ?Goal: Ability to describe self-care measures that may prevent or decrease complications (Diabetes Survival Skills Education) will improve ?Outcome: Progressing ?Goal: Individualized Educational Video(s) ?Outcome: Progressing ?  ?Problem: Health Behavior/Discharge Planning: ?Goal: Ability to identify and utilize available resources and services will improve ?Outcome: Progressing ?Goal:  Ability to manage health-related needs will improve ?Outcome: Progressing ?  ?Problem: Metabolic: ?Goal: Ability to maintain appropriate glucose levels will improve ?Outcome: Progressing ?  ?Problem: Nutritional: ?Goal: Maintenance of adequate nutrition will improve ?Outcome: Progressing ?Goal: Maintenance of adequate weight for body size and type will improve ?Outcome: Progressing ?  ?Problem: Respiratory: ?Goal: Will regain and/or maintain adequate ventilation ?Outcome: Progressing ?  ?Problem: Education: ?Goal: Knowledge of disease or condition will improve ?Outcome: Progressing ?Goal: Knowledge of secondary prevention will improve (SELECT ALL) ?Outcome: Progressing ?  ?Problem: Coping: ?Goal: Will verbalize positive feelings about self ?Outcome: Progressing ?Goal: Will identify appropriate support needs ?Outcome: Progressing ?  ?Problem: Self-Care: ?Goal: Ability to participate in self-care as condition permits will improve ?Outcome: Progressing ?  ?Problem: Ischemic Stroke/TIA Tissue Perfusion: ?Goal: Complications of ischemic stroke/TIA will be minimized ?Outcome: Progressing ?  ?

## 2021-06-13 DIAGNOSIS — J449 Chronic obstructive pulmonary disease, unspecified: Secondary | ICD-10-CM | POA: Diagnosis not present

## 2021-06-13 DIAGNOSIS — Z794 Long term (current) use of insulin: Secondary | ICD-10-CM | POA: Diagnosis not present

## 2021-06-13 DIAGNOSIS — E1165 Type 2 diabetes mellitus with hyperglycemia: Secondary | ICD-10-CM | POA: Diagnosis not present

## 2021-06-13 DIAGNOSIS — E1110 Type 2 diabetes mellitus with ketoacidosis without coma: Secondary | ICD-10-CM | POA: Diagnosis not present

## 2021-06-13 LAB — CULTURE, BLOOD (ROUTINE X 2)
Culture: NO GROWTH
Special Requests: ADEQUATE

## 2021-06-13 LAB — GLUCOSE, CAPILLARY
Glucose-Capillary: 229 mg/dL — ABNORMAL HIGH (ref 70–99)
Glucose-Capillary: 246 mg/dL — ABNORMAL HIGH (ref 70–99)
Glucose-Capillary: 269 mg/dL — ABNORMAL HIGH (ref 70–99)
Glucose-Capillary: 243 mg/dL — ABNORMAL HIGH (ref 70–99)
Glucose-Capillary: 267 mg/dL — ABNORMAL HIGH (ref 70–99)

## 2021-06-13 LAB — GLUCOSE, RANDOM: Glucose, Bld: 306 mg/dL — ABNORMAL HIGH (ref 70–99)

## 2021-06-13 LAB — PROTIME-INR
INR: 1.2 (ref 0.8–1.2)
Prothrombin Time: 14.7 seconds (ref 11.4–15.2)

## 2021-06-13 MED ORDER — WARFARIN SODIUM 5 MG PO TABS
10.0000 mg | ORAL_TABLET | Freq: Once | ORAL | Status: AC
  Administered 2021-06-13: 10 mg via ORAL
  Filled 2021-06-13: qty 2

## 2021-06-13 NOTE — TOC Progression Note (Signed)
Transition of Care (TOC) - Progression Note  ? ? ?Patient Details  ?Name: Margaret Mcdaniel ?MRN: [id number] ?Date of Birth: 04-12-1950 ? ?Transition of Care (TOC) CM/SW Contact  ?Kerin Salen, RN ?Phone Number: ?06/13/2021, 11:17 AM ? ?Clinical Narrative: Received a call from [place], [phone] with Authorization for [facility] approval [id number] and transportation approval [id number]. Called [facility] spoke with Ronnie Derby, Nurse and informed that admission will need to occur on Monday. Attending and Nurse notified.  ? ? ? ?Expected Discharge Plan: [place] ?Barriers to Discharge: Insurance Authorization ? ?Expected Discharge Plan and Services ?Expected Discharge Plan: [place] ?In-house Referral: Clinical Social Work ?  ?  ?Living arrangements for the past 2 months: [place] ?Expected Discharge Date: 06/13/21               ?  ?  ?  ?  ?  ?  ?  ?  ?  ?  ? ? ?Social Determinants of Health (SDOH) Interventions ?  ? ?Readmission Risk Interventions ? ?  06/08/2021  ? 11:26 AM  ?Readmission Risk Prevention Plan  ?Transportation Screening Complete  ?[place] or Home Care Consult Complete  ?Social Work Consult for [place] Planning/Counseling Complete  ?Palliative Care Screening Not Applicable  ?Medication Review Press photographer) Complete  ? ? ?

## 2021-06-13 NOTE — Progress Notes (Addendum)
?PROGRESS NOTE ? ? ?Margaret Mcdaniel  DEY:[id number] DOB: Jun 22, 1950 DOA: 06/07/2021 ?PCP: [name], MD  ? ?Chief Complaint  ?Patient presents with  ? Hyperglycemia  ? ?Level of care: Telemetry ? ?Brief Admission History:  ?71 y.o. female with medical history significant for diabetes mellitus, Parkinson's disease, COPD respiratory failure on 2 L, CKD 3, breast cancer. ?  ?Patient was brought to the ED from home, reports of altered mental status and high blood sugar.  Patient's son found patient on the floor.  Patient's son talked to patient yesterday at about 11 in the morning and this was the last time patient was here was seen normal.  Today family called patient, patient did not appear to be normal on the phone, and her breathing was labored.  They went over to check on patient and saw patient on the floor, she got up on her knees but could not stand up.   EMS was called, blood sugar read high.  Patient was on room air, her O2 sats were 82%.  At the time of my evaluation, patient is altered, unable to provide any history.  Daughter and son at bedside, they report that in January patient blood sugars were low in the 40s, medications were gradually reduced and stopped.  She was on [place], Ozempic.  She is currently still on metformin. ?  ?ED Course: Temperature 97.7.  Tachycardic heart rate 190 117.  Respirate rate 18-32.  Blood pressure systolic 314H to 702O.  O2 Sats 100% on 2 L.  Blood sugar 785, anion gap of 20, serum bicarb of 15.  Leukocytosis of 23.3.  Beta Hydroxybutyric acid 2.1. ?1.8 L bolus given, hospitalist to admit for DKA. ? ? ?06/08/2021 ?MRI reveals: Multiple small patchy acute cortical/subcortical infarcts within the right cerebral hemisphere affecting the posterior frontal lobe, parietal lobe, lateral occipital lobe and posterior temporal lobe.  These infarcts affect the posterior right MCA vascular territory, right MCA/PCA watershed territory and right MCA/ACA watershed territory.  ? ?Neurology  consultation requested.  Stroke orderset initiated. ?Pt transitioned off IV insulin to subcutaneous insulin.   ? ?06/09/2021:  Pt is going to need extensive rehabilitation.  PT/OT/SLP evaluations. Spoke with family who is agreeable to rehab.  Held off on CTA due to concerns about using contrast dye with poor renal function. Continue to hydrate today.  ? ? 06/13/21--- ?-Tentatively for discharge to [facility] on Monday, 06/15/2021 when SNF bed is available ? ?Assessment and Plan: ? ?-Acute stroke--multiple small patchy acute cortical/subcortical infarcts within the right cerebellar hemisphere ?CTA neck from 06/10/2021 shows-Mural adherent thrombus in the distal right common carotid artery, extending into the right ECA, causing approximately 60% stenosis of the right ECA. No significant stenosis of the right ICA. ?-Initially treated with aspirin and Plavix starting 06/10/2021, after further conversations between [name] the neurologist and [name] the stroke neurologist----decision was made to transition to Coumadin on 06/12/2021--INR is currently 1.2  ?-Continue aspirin 81 mg until INR is therapeutic at that point aspirin can be discontinued ?-Plavix will be discontinued as of 06/12/2021 ?-Monitor closely for possible hemorrhagic conversion/brain bleed ?-LDL 67, HDL 29 total cholesterol 135--give Lipitor 40 mg daily... Even if her lipid panel is within desired limits, patient should still take Lipitor/Statin for it's Pleiotropic effects (beyond cholesterol lowering benefits) ?-Echo with EF of 70 to 65% and grade 1 diastolic dysfunction with normal pulmonary artery pressures ?--PT /OT eval appreciated recommends CIR, insurance company denied CIR/inpatient rehab ?--tentatively for discharge to [facility]  [place] on Monday, 06/15/2021 when SNF bed is available ? ?Metastatic breast cancer- ?CTA neck from 06/10/2021 showed Sclerosis of C5 and the visualized thoracic spine (T1-T4), concerning for a neoplastic process, possibly  metastatic breast ?cancer given the patient's history of prior left-sided breast cancer that required mastectomy and chemotherapy ?- additional sclerotic foci in the right third rib. ? MRI of the cervical and thoracic spine is consistent with metastatic disease without evidence of cord compression ?-At this time patient and family would like to defer further oncology work-up ?-Once patient recovers from her stroke, they may consider oncology consult to discuss further work-up and possible palliative chemo or radiation therapy options ?--May also need PET scan and oncology follow-up as outpatient after resolution of acute neuro issues ?-Again patient and family at this point would like to hold off on further malignancy work-up or oncology interventions until patient makes meaningful recovery from her stroke ? ?* DKA (diabetic ketoacidosis)  ?Patient met criteria for DKA on admission ?-DKA pathophysiology resolved patient has been transitioned off IV insulin back to subcu insulin at this time ?-A1c 10.3 reflecting uncontrolled DM with hyperglycemia PTA ?-Persistent hyperglycemia noted ?-Increased Lantus insulin to 60 units nightly on 06/12/2021 ?-Sliding scale adjusted to more aggressive on 06/12/2021 ? ?Leukocytosis ?WBC 23.3, chronically elevated, last check on file 3 to 4 years ago, persistent leukocytosis ranging from 11-18..  Chest x-ray clear.  Afebrile.  At this time no focus of infection identified. ?-May need to follow-up with hem/onc on discharge ?-WBC appears closer to patient's baseline at this time ? ?Acute metabolic encephalopathy ?Acute metabolic encephalopathy with baseline Parkinson's disease.  With superimposed acute stroke ?-Mentation has improved significantly ? ?Staph epi bacteremia ?-- Blood cultures from 06/08/2021 2/2 bottles positive ?-- Pt has severe anaphylaxis reaction to cephalexin ?-This may be a contaminant , ?-Repeat blood cultures from 06/10/2021 negative to date okay to discontinue vancomycin   ? ?AKI (acute kidney injury) ([place]) on CKD 3B ?AKI on CKD 3A-B.  Creatinine 1.8, baseline to 1.3. ?-Creatinine down to 1.0 with hydration ?-Patient had CTA neck on 06/10/2021--- monitor renal function ? ?COPD (chronic obstructive pulmonary disease) ([place]) ?Stable. ?Continue current regimen ? ?Secondary Parkinson disease ([place]) ?--resume home medications  ? ? anemia--- ??  Acute versus acute on chronic --Hgb currently around 8 ?-No recent baseline available ?-No evidence of ongoing bleeding per se ?-Continue to monitor closely ? ?Social/ethics----plan of care and advanced directives discussed with patient and family at bedside ?-They request DNR/DNI status ?-Family would like to avoid heroic/overtly aggressive/invasive procedures at this time ?-They would like patient to attempt rehab and hopefully get some meaningful improvement/recovery from her stroke ? ? ?DVT prophylaxis: SCDs ?Code Status: Full  ?Family Communication: Discussed with son Gaspar Bidding and daughter-in-law [name] previously, updated daughter-in-law Ms. Cecilie Lowers by phone on 06/13/2021 ?Disposition: --PT /OT eval appreciated recommends CIR, insurance company denied CIR/inpatient rehab ?-Plan will be for SNF rehab pending insurance approval--tentatively for discharge to [facility] on Monday, 06/15/2021 when SNF bed is available ?Status is: Inpatient ?Remains inpatient appropriate because: acute stroke treatment  ?  ?Consultants:  ?Neurology  ?Procedures:  ?MRI/MRA brain ?CTA neck ?MRI thoracic and C-spine ?Antimicrobials:  ?-Vancomycin stopped on 06/12/2021 ?  ?Subjective: ? ?-Oral intake is fair ?-More awake and more conversational ?-Answers questions mostly appropriately ?-Moving left-sided extremities better ?-No fevers or emesis ?- ? ?Objective: ?Vitals:  ? 06/12/21 1242 06/12/21 2217 06/13/21 0426 06/13/21 0911  ?BP: 132/61 (!) 141/76 (!) 133/56 (!) 99/58  ?Pulse:  75 86 66 70  ?Resp: _0 ?Temp: 98.4 ?F (36.9 ?C) 100.2 ?F (37.9 ?C) 97.8 ?F (36.6  ?C)   ?TempSrc:  Oral    ?SpO2: 100% 100% 99% 100%  ?Weight:      ?Height:      ? ? ?Intake/Output Summary (Last 24 hours) at 06/13/2021 1132 ?Last data filed at 06/13/2021 0900 ?Gross per 24 hour  ?Intake 1900

## 2021-06-13 NOTE — Progress Notes (Signed)
Physical Therapy Treatment ?Patient Details ?Name: Margaret Mcdaniel ?MRN: [id number] ?DOB: June 08, 1950 ?Today's Date: 06/13/2021 ? ? ?History of Present Illness Margaret Mcdaniel is a 71 y.o. female with medical history significant for diabetes mellitus, Parkinson's disease, COPD respiratory failure on 2 L, CKD 3, breast cancer.     Patient was brought to the ED from home, reports of altered mental status and high blood sugar.  Patient's son found patient on the floor.  Patient's son talked to patient yesterday at about 11 in the morning and this was the last time patient was here was seen normal.  Today family called patient, patient did not appear to be normal on the phone, and her breathing was labored.  They went over to check on patient and saw patient on the floor, she got up on her knees but could not stand up.    EMS was called, blood sugar read high.  Patient was on room air, her O2 sats were 82%.  At the time of my evaluation, patient is altered, unable to provide any history.  Daughter and son at bedside, they report that in January patient blood sugars were low in the 40s, medications were gradually reduced and stopped.  She was on [place], Ozempic.  She is currently still on metformin. ? ?  ?PT Comments  ? ? Patient presents supine in bed. She is awake, alert and cooperative. Patient demos flat affect. Performed supine LE mobility and strengthening exercise. Patient requires increased verbal and tactile cues for LLE motion. Patient able to perform supine to sit with min A but requires verbal cues for hand and LE placement. Patient able to maintain seated balance but requires both UE assist. Performed seated marches at bedside. Patient noting increased fatigue and initiating return to supine position. Encouraged patient to continue with seated activity and attempt standing/ ambulatory activity but patient refused. Patient returned to supine with assist. Continued with additional LE strengthening exercises and  education on repositioning technique. Patient required verbal cues for bed repositioning as well as tactile cues and assist for LLE engagement. Of note, SpO2 monitored through session, dropped to 90% with seated exercise and returned to 95% supine with HOB elevated. Patient also educated on pursed lip breathing technique for increased O2 saturation levels. Patient left supine in bed with HOB elevated, phone and call bell in reach, bed alarm set. Patient will benefit from continued physical therapy in hospital and recommended venue below to increase strength, balance, endurance for safe ADLs and gait.  ?   ?Recommendations for follow up therapy are one component of a multi-disciplinary discharge planning process, led by the attending physician.  Recommendations may be updated based on patient status, additional functional criteria and insurance authorization. ? ?Follow Up Recommendations ? Acute inpatient rehab (3hours/day) ?  ?  ?Assistance Recommended at Discharge Intermittent Supervision/Assistance  ?Patient can return home with the following A lot of help with bathing/dressing/bathroom;A lot of help with walking and/or transfers;Help with stairs or ramp for entrance;Assistance with cooking/housework;Assist for transportation ?  ?Equipment Recommendations ? Rolling walker (2 wheels);None recommended by PT  ?  ?Recommendations for Other Services   ? ? ?  ?Precautions / Restrictions Precautions ?Precautions: Fall ?Restrictions ?Weight Bearing Restrictions: No  ?  ? ?Mobility ? Bed Mobility ?Overal bed mobility: Needs Assistance ?Bed Mobility: Supine to Sit, Sit to Supine ?  ?  ?Supine to sit: Min assist ?  ?  ?General bed mobility comments: Requires verbal cues for hand placement and weight  shifting, uses bed rail ?  ? ?Transfers ?Overall transfer level: Needs assistance ?Equipment used: Rolling walker (2 wheels) ?  ?  ?  ?  ?  ?  ?  ?  ?  ? ?Ambulation/Gait ?  ?  ?  ?  ?  ?  ?  ?  ? ? ?Stairs ?  ?  ?  ?  ?   ? ? ?Wheelchair Mobility ?  ? ?Modified Rankin (Stroke Patients Only) ?  ? ? ?  ?Balance Overall balance assessment: Needs assistance ?Sitting-balance support: Feet supported, Single extremity supported, Bilateral upper extremity supported ?Sitting balance-Leahy Scale: Poor ?Sitting balance - Comments: fair/poor seated at EOB ?Postural control: Right lateral lean, Posterior lean ?Standing balance support: Reliant on assistive device for balance, During functional activity, Bilateral upper extremity supported ?Standing balance-Leahy Scale: Zero ?Standing balance comment: unable to stand unnassisted ?  ?  ?  ?  ?  ?  ?  ?  ?  ?  ?  ?  ? ?  ?Cognition Arousal/Alertness: Awake/alert ?Behavior During Therapy: Flat affect, WFL for tasks assessed/performed ?Overall Cognitive Status: Within Functional Limits for tasks assessed ?  ?  ?  ?  ?  ?  ?  ?  ?  ?  ?  ?  ?  ?  ?  ?  ?General Comments: requires increased verbal/tactile cueing due to left sided neglect ?  ?  ? ?  ?Exercises General Exercises - Lower Extremity ?Heel Slides: Both, 20 reps, Supine, AROM, Strengthening ?Straight Leg Raises: Strengthening, Both, 20 reps, Supine ?Hip Flexion/Marching: Seated, AROM, Strengthening, Both, 15 reps ?Toe Raises: AROM, Strengthening, Supine, Both, 20 reps (limited motion in LT) ?Heel Raises: AROM, Strengthening, Both, Supine, 15 reps ? ?  ?General Comments   ?  ?  ? ?Pertinent Vitals/Pain Pain Assessment ?Pain Assessment: No/denies pain  ? ? ?Home Living   ?  ?  ?  ?  ?  ?  ?  ?  ?  ?   ?  ?Prior Function    ?  ?  ?   ? ?PT Goals (current goals can now be found in the care plan section) Acute Rehab PT Goals ?Patient Stated Goal: return home after rehab ?PT Goal Formulation: With patient ?Time For Goal Achievement: 06/24/21 ?Potential to Achieve Goals: Good ?Progress towards PT goals: Progressing toward goals ? ?  ?Frequency ? ? ? Min 5X/week ? ? ? ?  ?PT Plan Current plan remains appropriate  ? ? ?Co-evaluation   ?  ?  ?  ?  ? ?   ?AM-PAC PT "6 Clicks" Mobility   ?Outcome Measure ? Help needed turning from your back to your side while in a flat bed without using bedrails?: A Lot ?Help needed moving from lying on your back to sitting on the side of a flat bed without using bedrails?: A Lot ?Help needed moving to and from a bed to a chair (including a wheelchair)?: A Lot ?Help needed standing up from a chair using your arms (e.g., wheelchair or bedside chair)?: A Lot ?Help needed to walk in hospital room?: Total ?Help needed climbing 3-5 steps with a railing? : Total ?6 Click Score: 10 ? ?  ?End of Session   ?Activity Tolerance: Patient tolerated treatment well;Patient limited by fatigue ?Patient left: with bed alarm set;in bed;with call bell/phone within reach ?Nurse Communication: Mobility status ?PT Visit Diagnosis: Other abnormalities of gait and mobility (R26.89);Unsteadiness on feet (R26.81);Muscle weakness (generalized) (M62.81) ?  ? ? ?  Time: 8381-8403 ?PT Time Calculation (min) (ACUTE ONLY): 23 min ? ?Charges:  $Therapeutic Exercise: 8-22 mins ?$Neuromuscular Re-education: 8-22 mins          ?          ? ?2:35 PM, 06/13/21 ?Josue Hector PT DPT  ?Physical Therapist with [place]  ?[facility]  ?[phone] ? ?

## 2021-06-13 NOTE — Progress Notes (Signed)
ANTICOAGULATION CONSULT NOTE - Initial Up Consult ?  ?Pharmacy Consult for warfarin dosing  ?Indication: hypercoag state due to malignancy, carotid thrombus. goal INR 2-3 per MD  ?  ?Allergies  ?Allergen Reactions  ? Keflex [Cephalexin] Anaphylaxis  ?  Pt "codes" on this medication.  ? Cogentin [Benztropine] Other (See Comments)  ?  confusion  ? Sulfa Antibiotics Hives  ? Latex Rash  ? ? ?  ?Patient Measurements: ?Last Weight  Most recent update: 06/08/2021  4:38 AM  ? ? Weight  ?83.9 kg (184 lb 15.5 oz)  ?      ? ?  ? ?Body mass index is 31.75 kg/m?. ?[name] ? ?             ?Temp: 97.8 ?F (36.6 ?C) (04/08 0426) ?BP: 99/58 (04/08 0911) ?Pulse Rate: 70 (04/08 0911) ? ?Labs: ?Recent Labs  ?  06/11/21 ?0651 06/12/21 ?0601 06/12/21 ?1354 06/13/21 ?0742  ?HGB 8.1*  --   --   --   ?HCT 27.5*  --   --   --   ?PLT 298  --   --   --   ?LABPROT  --   --  15.0 14.7  ?INR  --   --  1.2 1.2  ?CREATININE 1.05* 1.11*  --   --   ? ? ? ?Estimated Creatinine Clearance: 48 mL/min (A) (by C-G formula based on SCr of 1.11 mg/dL (H)). ?  ?  ?Medications:  ?Medications Prior to Admission  ?Medication Sig Dispense Refill Last Dose  ? carbidopa-levodopa (SINEMET IR) 25-100 MG tablet Take 1 tablet by mouth 3 (three) times daily. 90 tablet 5 Past Week  ? chlorproMAZINE (THORAZINE) 50 MG tablet Take 100 mg by mouth at bedtime.    Past Week  ? citalopram (CELEXA) 40 MG tablet Take 40 mg by mouth daily.   Past Week  ? GVOKE PFS 1 MG/0.2ML SOSY Inject 1 mg into the skin See admin instructions. Inject if Blood sugar <50   unk  ? levothyroxine (SYNTHROID) 88 MCG tablet Take 88 mcg by mouth daily before breakfast.   Past Week  ? metFORMIN (GLUCOPHAGE-XR) 500 MG 24 hr tablet Take 1,000 mg by mouth daily.   Past Week  ? polyethylene glycol powder (GLYCOLAX/MIRALAX) powder Take 17 g by mouth daily.    Past Week  ? propranolol (INDERAL) 20 MG tablet Take 20 mg by mouth 3 (three) times daily.   Past Week  ? simvastatin (ZOCOR) 40 MG tablet Take 40  mg by mouth at bedtime.   Past Week  ? allopurinol (ZYLOPRIM) 100 MG tablet Take 100 mg by mouth daily. (Patient not taking: Reported on 06/07/2021)   Not Taking  ? aspirin EC 81 MG tablet Take 81 mg by mouth daily. (Patient not taking: Reported on 06/07/2021)   Not Taking  ? ferrous sulfate 325 (65 FE) MG tablet Take 325 mg by mouth daily with breakfast. (Patient not taking: Reported on 06/07/2021)   Not Taking  ? insulin degludec (TRESIBA) 100 UNIT/ML FlexTouch Pen Inject 50 Units into the skin at bedtime.  (Patient not taking: Reported on 06/07/2021)   Not Taking  ? mirtazapine (REMERON) 15 MG tablet Take 15 mg by mouth at bedtime. (Patient not taking: Reported on 06/07/2021)   Not Taking  ? niacin 500 MG CR capsule Take 500 mg by mouth at bedtime. (Patient not taking: Reported on 06/07/2021)   Not Taking  ? nitrofurantoin (MACRODANTIN) 100 MG capsule Take 100 mg  by mouth daily. (Patient not taking: Reported on 06/07/2021)   Not Taking  ? NOVOLOG MIX 70/30 FLEXPEN (70-30) 100 UNIT/ML FlexPen Inject 30 Units into the skin 2 (two) times daily with a meal.  (Patient not taking: Reported on 06/07/2021)   Not Taking  ? omeprazole (PRILOSEC) 40 MG capsule Take 40 mg by mouth daily. (Patient not taking: Reported on 06/07/2021)   Not Taking  ? temazepam (RESTORIL) 15 MG capsule Take 15 mg by mouth at bedtime. (Patient not taking: Reported on 06/07/2021)   Not Taking  ? tiZANidine (ZANAFLEX) 4 MG tablet Take 8 mg by mouth 2 (two) times daily. (Patient not taking: Reported on 06/07/2021)   Not Taking  ? traMADol (ULTRAM) 50 MG tablet Take 1 tablet (50 mg total) by mouth every 12 (twelve) hours as needed. (Patient not taking: Reported on 06/07/2021) 15 tablet 0 Not Taking  ? ?Scheduled:  ? aspirin EC  81 mg Oral Daily  ? atorvastatin  40 mg Oral q1800  ? carbidopa-levodopa  1 tablet Oral TID  ? chlorproMAZINE  100 mg Oral QHS  ? citalopram  40 mg Oral Daily  ? feeding supplement  237 mL Oral BID BM  ? insulin aspart  0-20 Units Subcutaneous TID WC   ? insulin aspart  0-5 Units Subcutaneous QHS  ? insulin aspart  14 Units Subcutaneous TID WC  ? insulin glargine-yfgn  60 Units Subcutaneous QHS  ? levothyroxine  88 mcg Oral Q0600  ? propranolol  10 mg Oral TID  ? warfarin  10 mg Oral ONCE-1600  ? Warfarin - Pharmacist Dosing Inpatient   Does not apply [id number]  ? ?Infusions:  ?PRN: acetaminophen **OR** acetaminophen, dextrose, LORazepam ?Anti-infectives (From admission, onward)  ? ? Start     Dose/Rate Route Frequency Ordered Stop  ? 06/10/21 1500  vancomycin (VANCOCIN) IVPB 1000 mg/200 mL premix  Status:  Discontinued       ? 1,000 mg ?200 mL/hr over 60 Minutes Intravenous Every 24 hours 06/09/21 1413 06/12/21 1011  ? 06/09/21 1500  vancomycin (VANCOREADY) IVPB 1500 mg/300 mL       ? 1,500 mg ?150 mL/hr over 120 Minutes Intravenous  Once 06/09/21 1406 06/09/21 1803  ? ?  ? ? ?Goal of Therapy:  ?INR 2-3 ?Monitor platelets by anticoagulation protocol: Yes ? ? ? Prior to Admission Warfarin Dosing: ? DANAISHA CELLI did not take warfarin PTA.  ?   ? Admit INR was 1.2 ?Lab Results  ?Component Value Date  ? INR 1.2 06/13/2021  ? INR 1.2 06/12/2021  ? INR 1.07 08/24/2016  ? ? ?Assessment: ?Margaret Mcdaniel a 71 y.o. female requires anticoagulation with warfarin for the indication of   hypercoag state due to malignancy, carotid thrombus. . Warfarin will be initiated inpatient following pharmacy protocol per pharmacy consult. Patient most recent blood work is as follows: ? ?  Latest Ref Rng & Units 06/11/2021  ?  6:51 AM 06/10/2021  ?  5:42 AM 06/09/2021  ?  6:05 AM  ?CBC  ?WBC 4.0 - 10.5 K/uL 13.4   13.4   12.6    ?Hemoglobin 12.0 - 15.0 g/dL 8.1   7.9   7.7    ?Hematocrit 36.0 - 46.0 % 27.5   26.1   25.9    ?Platelets 150 - 400 K/uL 298   326   333    ? ? ? ?Plan: ?Warfarin '10mg'$  po x 1 dose tonight ?Monitor CBC daily with am labs   ?  Monitor INR daily ?Monitor for signs and symptoms of bleeding  ? ?Donna Christen Banyan Goodchild, PharmD, MBA, BCGP ?Clinical Pharmacist ? ? ? ?

## 2021-06-14 DIAGNOSIS — N1832 Chronic kidney disease, stage 3b: Secondary | ICD-10-CM | POA: Diagnosis not present

## 2021-06-14 DIAGNOSIS — E1110 Type 2 diabetes mellitus with ketoacidosis without coma: Secondary | ICD-10-CM | POA: Diagnosis not present

## 2021-06-14 DIAGNOSIS — E1165 Type 2 diabetes mellitus with hyperglycemia: Secondary | ICD-10-CM | POA: Diagnosis not present

## 2021-06-14 DIAGNOSIS — Z794 Long term (current) use of insulin: Secondary | ICD-10-CM | POA: Diagnosis not present

## 2021-06-14 LAB — GLUCOSE, CAPILLARY
Glucose-Capillary: 317 mg/dL — ABNORMAL HIGH (ref 70–99)
Glucose-Capillary: 311 mg/dL — ABNORMAL HIGH (ref 70–99)
Glucose-Capillary: 333 mg/dL — ABNORMAL HIGH (ref 70–99)
Glucose-Capillary: 145 mg/dL — ABNORMAL HIGH (ref 70–99)
Glucose-Capillary: 120 mg/dL — ABNORMAL HIGH (ref 70–99)

## 2021-06-14 LAB — PROTIME-INR
INR: 1.2 (ref 0.8–1.2)
Prothrombin Time: 15.1 seconds (ref 11.4–15.2)

## 2021-06-14 MED ORDER — INSULIN GLARGINE-YFGN 100 UNIT/ML ~~LOC~~ SOLN
70.0000 [IU] | Freq: Every day | SUBCUTANEOUS | Status: DC
  Administered 2021-06-14 – 2021-06-15 (×2): 70 [IU] via SUBCUTANEOUS
  Filled 2021-06-14 (×3): qty 0.7

## 2021-06-14 MED ORDER — POLYETHYLENE GLYCOL 3350 17 G PO PACK
17.0000 g | PACK | Freq: Two times a day (BID) | ORAL | Status: DC
Start: 2021-06-14 — End: 2021-06-15
  Administered 2021-06-14: 17 g via ORAL
  Filled 2021-06-14: qty 1

## 2021-06-14 MED ORDER — WARFARIN SODIUM 5 MG PO TABS
10.0000 mg | ORAL_TABLET | Freq: Once | ORAL | Status: AC
  Administered 2021-06-14: 10 mg via ORAL
  Filled 2021-06-14: qty 2

## 2021-06-14 MED ORDER — BISACODYL 10 MG RE SUPP
10.0000 mg | Freq: Once | RECTAL | Status: AC
  Administered 2021-06-14: 10 mg via RECTAL
  Filled 2021-06-14: qty 1

## 2021-06-14 MED ORDER — WARFARIN SODIUM 7.5 MG PO TABS: 15.0000 mg | ORAL_TABLET | Freq: Once | ORAL | Status: DC

## 2021-06-14 NOTE — Progress Notes (Addendum)
ANTICOAGULATION CONSULT NOTE - Consult ?  ?Pharmacy Consult for warfarin dosing  ?Indication: hypercoag state due to malignancy, carotid thrombus. goal INR 2-3 per MD  ?  ?Allergies  ?Allergen Reactions  ? Keflex [Cephalexin] Anaphylaxis  ?  Pt "codes" on this medication.  ? Cogentin [Benztropine] Other (See Comments)  ?  confusion  ? Sulfa Antibiotics Hives  ? Latex Rash  ? ? ?  ?Patient Measurements: ?Last Weight  Most recent update: 06/08/2021  4:38 AM  ? ? Weight  ?83.9 kg (184 lb 15.5 oz)  ?      ? ?  ? ?Body mass index is 31.75 kg/m?. ?Margaret Mcdaniel ? ?             ?Temp: 99.7 ?F (37.6 ?C) (04/09 0329) ?Temp Source: Oral (04/09 0329) ?BP: 121/87 (04/09 0329) ?Pulse Rate: 72 (04/09 0329) ? ?Labs: ?Recent Labs  ?  06/12/21 ?0601 06/12/21 ?1354 06/13/21 ?0742 06/14/21 ?0624  ?LABPROT  --  15.0 14.7 15.1  ?INR  --  1.2 1.2 1.2  ?CREATININE 1.11*  --   --   --   ? ? ? ?Estimated Creatinine Clearance: 48 mL/min (A) (by C-G formula based on SCr of 1.11 mg/dL (H)). ?  ?  ?Medications:  ?Medications Prior to Admission  ?Medication Sig Dispense Refill Last Dose  ? carbidopa-levodopa (SINEMET IR) 25-100 MG tablet Take 1 tablet by mouth 3 (three) times daily. 90 tablet 5 Past Week  ? chlorproMAZINE (THORAZINE) 50 MG tablet Take 100 mg by mouth at bedtime.    Past Week  ? citalopram (CELEXA) 40 MG tablet Take 40 mg by mouth daily.   Past Week  ? GVOKE PFS 1 MG/0.2ML SOSY Inject 1 mg into the skin See admin instructions. Inject if Blood sugar <50   unk  ? levothyroxine (SYNTHROID) 88 MCG tablet Take 88 mcg by mouth daily before breakfast.   Past Week  ? metFORMIN (GLUCOPHAGE-XR) 500 MG 24 hr tablet Take 1,000 mg by mouth daily.   Past Week  ? polyethylene glycol powder (GLYCOLAX/MIRALAX) powder Take 17 g by mouth daily.    Past Week  ? propranolol (INDERAL) 20 MG tablet Take 20 mg by mouth 3 (three) times daily.   Past Week  ? simvastatin (ZOCOR) 40 MG tablet Take 40 mg by mouth at bedtime.   Past Week  ? allopurinol  (ZYLOPRIM) 100 MG tablet Take 100 mg by mouth daily. (Patient not taking: Reported on 06/07/2021)   Not Taking  ? aspirin EC 81 MG tablet Take 81 mg by mouth daily. (Patient not taking: Reported on 06/07/2021)   Not Taking  ? ferrous sulfate 325 (65 FE) MG tablet Take 325 mg by mouth daily with breakfast. (Patient not taking: Reported on 06/07/2021)   Not Taking  ? insulin degludec (TRESIBA) 100 UNIT/ML FlexTouch Pen Inject 50 Units into the skin at bedtime.  (Patient not taking: Reported on 06/07/2021)   Not Taking  ? mirtazapine (REMERON) 15 MG tablet Take 15 mg by mouth at bedtime. (Patient not taking: Reported on 06/07/2021)   Not Taking  ? niacin 500 MG CR capsule Take 500 mg by mouth at bedtime. (Patient not taking: Reported on 06/07/2021)   Not Taking  ? nitrofurantoin (MACRODANTIN) 100 MG capsule Take 100 mg by mouth daily. (Patient not taking: Reported on 06/07/2021)   Not Taking  ? NOVOLOG MIX 70/30 FLEXPEN (70-30) 100 UNIT/ML FlexPen Inject 30 Units into the skin 2 (two) times daily with a  meal.  (Patient not taking: Reported on 06/07/2021)   Not Taking  ? omeprazole (PRILOSEC) 40 MG capsule Take 40 mg by mouth daily. (Patient not taking: Reported on 06/07/2021)   Not Taking  ? temazepam (RESTORIL) 15 MG capsule Take 15 mg by mouth at bedtime. (Patient not taking: Reported on 06/07/2021)   Not Taking  ? tiZANidine (ZANAFLEX) 4 MG tablet Take 8 mg by mouth 2 (two) times daily. (Patient not taking: Reported on 06/07/2021)   Not Taking  ? traMADol (ULTRAM) 50 MG tablet Take 1 tablet (50 mg total) by mouth every 12 (twelve) hours as needed. (Patient not taking: Reported on 06/07/2021) 15 tablet 0 Not Taking  ? ?Scheduled:  ? aspirin EC  81 mg Oral Daily  ? atorvastatin  40 mg Oral q1800  ? carbidopa-levodopa  1 tablet Oral TID  ? chlorproMAZINE  100 mg Oral QHS  ? citalopram  40 mg Oral Daily  ? feeding supplement  237 mL Oral BID BM  ? insulin aspart  0-20 Units Subcutaneous TID WC  ? insulin aspart  0-5 Units Subcutaneous QHS  ?  insulin aspart  14 Units Subcutaneous TID WC  ? insulin glargine-yfgn  60 Units Subcutaneous QHS  ? levothyroxine  88 mcg Oral Q0600  ? propranolol  10 mg Oral TID  ? Warfarin - Pharmacist Dosing Inpatient   Does not apply [id number]  ? ?Infusions:  ?PRN: acetaminophen **OR** acetaminophen, dextrose, LORazepam ?Anti-infectives (From admission, onward)  ? ? Start     Dose/Rate Route Frequency Ordered Stop  ? 06/10/21 1500  vancomycin (VANCOCIN) IVPB 1000 mg/200 mL premix  Status:  Discontinued       ? 1,000 mg ?200 mL/hr over 60 Minutes Intravenous Every 24 hours 06/09/21 1413 06/12/21 1011  ? 06/09/21 1500  vancomycin (VANCOREADY) IVPB 1500 mg/300 mL       ? 1,500 mg ?150 mL/hr over 120 Minutes Intravenous  Once 06/09/21 1406 06/09/21 1803  ? ?  ? ? ?Goal of Therapy:  ?INR 2-3 ?Monitor platelets by anticoagulation protocol: Yes ? ? ? Prior to Admission Warfarin Dosing: ? Margaret Mcdaniel did not take warfarin PTA.  ?   ? Admit INR was 1.2 ?Lab Results  ?Component Value Date  ? INR 1.2 06/14/2021  ? INR 1.2 06/13/2021  ? INR 1.2 06/12/2021  ? ? ?Assessment: ?Margaret Mcdaniel a 71 y.o. female requires anticoagulation with warfarin for the indication of   hypercoag state due to malignancy, carotid thrombus. . Warfarin will be initiated inpatient following pharmacy protocol per pharmacy consult. Patient most recent blood work is as follows: ? ?  Latest Ref Rng & Units 06/11/2021  ?  6:51 AM 06/10/2021  ?  5:42 AM 06/09/2021  ?  6:05 AM  ?CBC  ?WBC 4.0 - 10.5 K/uL 13.4   13.4   12.6    ?Hemoglobin 12.0 - 15.0 g/dL 8.1   7.9   7.7    ?Hematocrit 36.0 - 46.0 % 27.5   26.1   25.9    ?Platelets 150 - 400 K/uL 298   326   333    ? ? ? ?Plan: ?Warfarin '10mg'$  po x 1 dose tonight ?Monitor CBC daily with am labs   ?Monitor INR daily ?Monitor for signs and symptoms of bleeding  ? ?Donna Christen Darrian Goodwill, PharmD, MBA, BCGP ?Clinical Pharmacist ? ? ? ?

## 2021-06-14 NOTE — Progress Notes (Signed)
Pt up to [facility] with one assist for BM, pt with good weight bearing on both feet, able to take small steps to [facility] and sit upright without leaning to the left, using left arm to prop self up on arm of BSC. Pt with moderate sized soft, brown BM. Pt cleaned and back to bed without difficulty. ?

## 2021-06-14 NOTE — Progress Notes (Signed)
MD Courage in to evaluate pt this am. Pt denies c/o, tolerating oral food/fluids without difficulty, A&O x3, responds appropriately to questions and commands. Pt has not recorded BM since admission and pt states she does not remember when she last had a BM. MD aware. Pt took am meds whole without difficulty. Pt then up to [facility] with assist x1 for balance. Pt able to void clear yellow urine and passed flatus but unable to have BM.  ?Pt bath completed, bed linens changed. Pt back to bed (refused to sit in recliner, states, "I don't like that. I want to lay down.") Purewick replaced, HOB to pt's comfort, bed alarm on and call bell in reach. Pt denies further needs at this time. ?

## 2021-06-14 NOTE — Progress Notes (Signed)
?PROGRESS NOTE ? ? ?Margaret Mcdaniel  PJK:[id number] DOB: 1950/06/27 DOA: 06/07/2021 ?PCP: [name], MD  ? ?Chief Complaint  ?Patient presents with  ? Hyperglycemia  ? ?Level of care: Telemetry ? ?Brief Admission History:  ?71 y.o. female with medical history significant for diabetes mellitus, Parkinson's disease, COPD respiratory failure on 2 L, CKD 3, breast cancer. ?  ?Patient was brought to the ED from home, reports of altered mental status and high blood sugar.  Patient's son found patient on the floor.  Patient's son talked to patient yesterday at about 11 in the morning and this was the last time patient was here was seen normal.  Today family called patient, patient did not appear to be normal on the phone, and her breathing was labored.  They went over to check on patient and saw patient on the floor, she got up on her knees but could not stand up.   EMS was called, blood sugar read high.  Patient was on room air, her O2 sats were 82%.  At the time of my evaluation, patient is altered, unable to provide any history.  Daughter and son at bedside, they report that in January patient blood sugars were low in the 40s, medications were gradually reduced and stopped.  She was on [place], Ozempic.  She is currently still on metformin. ?  ?ED Course: Temperature 97.7.  Tachycardic heart rate 190 117.  Respirate rate 18-32.  Blood pressure systolic 809X to 833A.  O2 Sats 100% on 2 L.  Blood sugar 785, anion gap of 20, serum bicarb of 15.  Leukocytosis of 23.3.  Beta Hydroxybutyric acid 2.1. ?1.8 L bolus given, hospitalist to admit for DKA. ? ? ?06/08/2021 ?MRI reveals: Multiple small patchy acute cortical/subcortical infarcts within the right cerebral hemisphere affecting the posterior frontal lobe, parietal lobe, lateral occipital lobe and posterior temporal lobe.  These infarcts affect the posterior right MCA vascular territory, right MCA/PCA watershed territory and right MCA/ACA watershed territory.  ? ?Neurology  consultation requested.  Stroke orderset initiated. ?Pt transitioned off IV insulin to subcutaneous insulin.   ? ?06/09/2021:  Pt is going to need extensive rehabilitation.  PT/OT/SLP evaluations. Spoke with family who is agreeable to rehab.  Held off on CTA due to concerns about using contrast dye with poor renal function. Continue to hydrate today.  ? ? 06/14/21--- ?-Tentatively for discharge to [facility] on Monday, 06/15/2021 when SNF bed is available ? ?Assessment and Plan: ? ?-Acute stroke--multiple small patchy acute cortical/subcortical infarcts within the right cerebellar hemisphere ?CTA neck from 06/10/2021 shows-Mural adherent thrombus in the distal right common carotid artery, extending into the right ECA, causing approximately 60% stenosis of the right ECA. No significant stenosis of the right ICA. ?-Initially treated with aspirin and Plavix starting 06/10/2021, after further conversations between [name] the neurologist and [name] the stroke neurologist----decision was made to transition to Coumadin on 06/12/2021-- ?INR remains subtherapeutic ?-Continue aspirin 81 mg until INR is therapeutic at that point aspirin can be discontinued ?-Plavix  discontinued as of 06/12/2021 ?-Monitor closely for possible hemorrhagic conversion/brain bleed ?-LDL 67, HDL 29 total cholesterol 135--give Lipitor 40 mg daily... Even if her lipid panel is within desired limits, patient should still take Lipitor/Statin for it's Pleiotropic effects (beyond cholesterol lowering benefits) ?-Echo with EF of 70 to 65% and grade 1 diastolic dysfunction with normal pulmonary artery pressures ?--PT /OT eval appreciated recommends CIR, insurance company denied CIR/inpatient rehab ?--tentatively for discharge to [facility] on  Monday, 06/15/2021 when SNF bed is available ? ?Metastatic Breast cancer- ?CTA neck from 06/10/2021 showed Sclerosis of C5 and the visualized thoracic spine (T1-T4), concerning for a neoplastic process, possibly  metastatic breast ?cancer given the patient's history of prior left-sided breast cancer that required mastectomy and chemotherapy ?- additional sclerotic foci in the right third rib. ? MRI of the cervical and thoracic spine is consistent with metastatic disease without evidence of cord compression ?-At this time patient and family would like to defer further oncology work-up ?-Once patient recovers from her stroke, they may consider oncology consult to discuss further work-up and possible palliative chemo or radiation therapy options ?--May also need PET scan and oncology follow-up as outpatient after resolution of acute neuro issues ?-Again patient and family at this point would like to hold off on further malignancy work-up or oncology interventions until patient makes meaningful recovery from her stroke ? ?* DKA (diabetic ketoacidosis)  ?Patient met criteria for DKA on admission ?-DKA pathophysiology resolved patient has been transitioned off IV insulin back to subcu insulin at this time ?-A1c 10.3 reflecting uncontrolled DM with hyperglycemia PTA ?-Persistent hyperglycemia noted ?-Increase Lantus insulin further to 70 units nightly on 06/14/2021 ?-Sliding scale adjusted to more aggressive  ? ?Leukocytosis ?WBC 23.3, chronically elevated, last check on file 3 to 4 years ago, persistent leukocytosis ranging from 11-18..  Chest x-ray clear.  Afebrile.  At this time no focus of infection identified. ?-May need to follow-up with hem/onc on discharge ?-WBC appears closer to patient's baseline at this time ? ?Acute metabolic encephalopathy ?Acute metabolic encephalopathy with baseline Parkinson's disease.  With superimposed acute stroke ?-Mentation has improved significantly ? ?Staph epi bacteremia ?-- Blood cultures from 06/08/2021   -2/2 bottles positive ?-- Pt has severe anaphylaxis reaction to cephalexin ?-This may be a contaminant , ?-Repeat blood cultures from 06/10/2021 negative to date okay to discontinued vancomycin   ? ?AKI (acute kidney injury) ([place]) on CKD 3B ?AKI on CKD 3A-B.  Creatinine 1.8, baseline to 1.3. ?-Creatinine down to 1.0 with hydration ?-Patient had CTA neck on 06/10/2021--- monitor renal function ? ?COPD (chronic obstructive pulmonary disease) ([place]) ?Stable. ?Continue current regimen ? ?Secondary Parkinson disease ([place]) ?--resume home medications  ? ? anemia--- ??  Acute versus acute on chronic --Hgb currently around 8 ?-No recent baseline available ?-No evidence of ongoing bleeding per se ?-Continue to monitor closely ? ?Social/ethics----plan of care and advanced directives discussed with patient and family at bedside ?-They request DNR/DNI status ?-Family would like to avoid heroic/overtly aggressive/invasive procedures at this time ?-They would like patient to attempt rehab and hopefully get some meaningful improvement/recovery from her stroke ? ?Constipation--- MiraLAX and Dulcolax as ordered ? ?DVT prophylaxis: SCDs ?Code Status: Full  ?Family Communication: Discussed with son Gaspar Bidding and daughter-in-law [name] previously, updated daughter-in-law Ms. Cecilie Lowers by phone on 06/13/2021 ?Disposition: --PT /OT eval appreciated recommends CIR, insurance company denied CIR/inpatient rehab ?-Plan will be for SNF rehab pending insurance approval--tentatively for discharge to [facility] on Monday, 06/15/2021 when SNF bed is available ?Status is: Inpatient ?Remains inpatient appropriate because: acute stroke treatment  ?  ?Consultants:  ?Neurology  ?Procedures:  ?MRI/MRA brain ?CTA neck ?MRI thoracic and C-spine ?Antimicrobials:  ?-Vancomycin stopped on 06/12/2021 ?  ?Subjective: ? ?-RN Crystal at bedside ?-Patient cannot remember the last time she had a bowel movement but its been a few days at least ?-Tolerating oral intake ?-More awake and more interactive overall ? ? ?Objective: ?Vitals:  ? 06/13/21 2017  06/14/21 0329 06/14/21 0947 06/14/21 1243  ?BP: (!) 124/39 121/87 (!) 92/42 (!) 116/57  ?Pulse: 75 72 73 81   ?Resp: $Remov'18 18 18 17  'fYlUxu$ ?Temp: 99 ?F (37.2 ?C) 99.7 ?F (37.6 ?C)  98 ?F (36.7 ?C)  ?TempSrc: Oral Oral    ?SpO2: 98% 98% 98% 99%  ?Weight:      ?Height:      ? ? ?Intake/Output Summary (Last 24 hours) at 06/14/2021

## 2021-06-15 DIAGNOSIS — I639 Cerebral infarction, unspecified: Secondary | ICD-10-CM | POA: Diagnosis not present

## 2021-06-15 DIAGNOSIS — E1110 Type 2 diabetes mellitus with ketoacidosis without coma: Secondary | ICD-10-CM | POA: Diagnosis not present

## 2021-06-15 DIAGNOSIS — Z79890 Hormone replacement therapy (postmenopausal): Secondary | ICD-10-CM | POA: Diagnosis not present

## 2021-06-15 DIAGNOSIS — R41841 Cognitive communication deficit: Secondary | ICD-10-CM | POA: Diagnosis not present

## 2021-06-15 DIAGNOSIS — Z85830 Personal history of malignant neoplasm of bone: Secondary | ICD-10-CM | POA: Diagnosis not present

## 2021-06-15 DIAGNOSIS — S0990XA Unspecified injury of head, initial encounter: Secondary | ICD-10-CM | POA: Diagnosis not present

## 2021-06-15 DIAGNOSIS — M25529 Pain in unspecified elbow: Secondary | ICD-10-CM | POA: Diagnosis not present

## 2021-06-15 DIAGNOSIS — Z7401 Bed confinement status: Secondary | ICD-10-CM | POA: Diagnosis not present

## 2021-06-15 DIAGNOSIS — A4189 Other specified sepsis: Secondary | ICD-10-CM | POA: Diagnosis not present

## 2021-06-15 DIAGNOSIS — Z882 Allergy status to sulfonamides status: Secondary | ICD-10-CM | POA: Diagnosis not present

## 2021-06-15 DIAGNOSIS — E1165 Type 2 diabetes mellitus with hyperglycemia: Secondary | ICD-10-CM | POA: Diagnosis not present

## 2021-06-15 DIAGNOSIS — I959 Hypotension, unspecified: Secondary | ICD-10-CM | POA: Diagnosis not present

## 2021-06-15 DIAGNOSIS — Z794 Long term (current) use of insulin: Secondary | ICD-10-CM | POA: Diagnosis not present

## 2021-06-15 DIAGNOSIS — G2111 Neuroleptic induced parkinsonism: Secondary | ICD-10-CM | POA: Diagnosis not present

## 2021-06-15 DIAGNOSIS — Z91040 Latex allergy status: Secondary | ICD-10-CM | POA: Diagnosis not present

## 2021-06-15 DIAGNOSIS — S199XXA Unspecified injury of neck, initial encounter: Secondary | ICD-10-CM | POA: Diagnosis not present

## 2021-06-15 DIAGNOSIS — R739 Hyperglycemia, unspecified: Secondary | ICD-10-CM | POA: Diagnosis not present

## 2021-06-15 DIAGNOSIS — W19XXXA Unspecified fall, initial encounter: Secondary | ICD-10-CM | POA: Diagnosis not present

## 2021-06-15 DIAGNOSIS — J449 Chronic obstructive pulmonary disease, unspecified: Secondary | ICD-10-CM | POA: Diagnosis not present

## 2021-06-15 DIAGNOSIS — Z881 Allergy status to other antibiotic agents status: Secondary | ICD-10-CM | POA: Diagnosis not present

## 2021-06-15 DIAGNOSIS — I6523 Occlusion and stenosis of bilateral carotid arteries: Secondary | ICD-10-CM | POA: Diagnosis not present

## 2021-06-15 DIAGNOSIS — R52 Pain, unspecified: Secondary | ICD-10-CM | POA: Diagnosis not present

## 2021-06-15 DIAGNOSIS — Z7982 Long term (current) use of aspirin: Secondary | ICD-10-CM | POA: Diagnosis not present

## 2021-06-15 DIAGNOSIS — R2689 Other abnormalities of gait and mobility: Secondary | ICD-10-CM | POA: Diagnosis not present

## 2021-06-15 DIAGNOSIS — N179 Acute kidney failure, unspecified: Secondary | ICD-10-CM | POA: Diagnosis not present

## 2021-06-15 DIAGNOSIS — R509 Fever, unspecified: Secondary | ICD-10-CM | POA: Diagnosis not present

## 2021-06-15 DIAGNOSIS — F0390 Unspecified dementia without behavioral disturbance: Secondary | ICD-10-CM | POA: Diagnosis not present

## 2021-06-15 DIAGNOSIS — I10 Essential (primary) hypertension: Secondary | ICD-10-CM | POA: Diagnosis not present

## 2021-06-15 DIAGNOSIS — E1310 Other specified diabetes mellitus with ketoacidosis without coma: Secondary | ICD-10-CM | POA: Diagnosis not present

## 2021-06-15 DIAGNOSIS — M6281 Muscle weakness (generalized): Secondary | ICD-10-CM | POA: Diagnosis not present

## 2021-06-15 DIAGNOSIS — M5030 Other cervical disc degeneration, unspecified cervical region: Secondary | ICD-10-CM | POA: Diagnosis not present

## 2021-06-15 DIAGNOSIS — G9341 Metabolic encephalopathy: Secondary | ICD-10-CM | POA: Diagnosis not present

## 2021-06-15 DIAGNOSIS — Z8673 Personal history of transient ischemic attack (TIA), and cerebral infarction without residual deficits: Secondary | ICD-10-CM | POA: Diagnosis not present

## 2021-06-15 DIAGNOSIS — M25521 Pain in right elbow: Secondary | ICD-10-CM | POA: Diagnosis not present

## 2021-06-15 DIAGNOSIS — R531 Weakness: Secondary | ICD-10-CM | POA: Diagnosis not present

## 2021-06-15 DIAGNOSIS — G219 Secondary parkinsonism, unspecified: Secondary | ICD-10-CM | POA: Diagnosis not present

## 2021-06-15 DIAGNOSIS — N1832 Chronic kidney disease, stage 3b: Secondary | ICD-10-CM | POA: Diagnosis not present

## 2021-06-15 DIAGNOSIS — N183 Chronic kidney disease, stage 3 unspecified: Secondary | ICD-10-CM | POA: Diagnosis not present

## 2021-06-15 LAB — CULTURE, BLOOD (ROUTINE X 2)
Culture: NO GROWTH
Culture: NO GROWTH

## 2021-06-15 LAB — CBC
HCT: 24.3 % — ABNORMAL LOW (ref 36.0–46.0)
Hemoglobin: 7.3 g/dL — ABNORMAL LOW (ref 12.0–15.0)
MCH: 24.3 pg — ABNORMAL LOW (ref 26.0–34.0)
MCHC: 30 g/dL (ref 30.0–36.0)
MCV: 81 fL (ref 80.0–100.0)
Platelets: 347 10*3/uL (ref 150–400)
RBC: 3 MIL/uL — ABNORMAL LOW (ref 3.87–5.11)
RDW: 17.1 % — ABNORMAL HIGH (ref 11.5–15.5)
WBC: 15.9 10*3/uL — ABNORMAL HIGH (ref 4.0–10.5)
nRBC: 0 % (ref 0.0–0.2)

## 2021-06-15 LAB — PROTIME-INR
INR: 1.6 — ABNORMAL HIGH (ref 0.8–1.2)
Prothrombin Time: 19.1 seconds — ABNORMAL HIGH (ref 11.4–15.2)

## 2021-06-15 LAB — GLUCOSE, CAPILLARY
Glucose-Capillary: 154 mg/dL — ABNORMAL HIGH (ref 70–99)
Glucose-Capillary: 141 mg/dL — ABNORMAL HIGH (ref 70–99)
Glucose-Capillary: 285 mg/dL — ABNORMAL HIGH (ref 70–99)

## 2021-06-15 MED ORDER — WARFARIN SODIUM 5 MG PO TABS: 5.0000 mg | ORAL_TABLET | Freq: Every day | ORAL | 2 refills | Status: AC

## 2021-06-15 MED ORDER — ACETAMINOPHEN 325 MG PO TABS: 650.0000 mg | ORAL_TABLET | Freq: Four times a day (QID) | ORAL | 0 refills | Status: DC | PRN

## 2021-06-15 MED ORDER — PROPRANOLOL HCL 10 MG PO TABS: 10.0000 mg | ORAL_TABLET | Freq: Three times a day (TID) | ORAL | 0 refills | Status: DC

## 2021-06-15 MED ORDER — INSULIN ASPART 100 UNIT/ML FLEXPEN: 0.0000 [IU] | PEN_INJECTOR | SUBCUTANEOUS | 11 refills | Status: DC

## 2021-06-15 MED ORDER — POLYETHYLENE GLYCOL 3350 17 GM/SCOOP PO POWD
17.0000 g | ORAL | 0 refills | Status: DC
Start: 2021-06-15 — End: 2022-08-25

## 2021-06-15 MED ORDER — ASPIRIN EC 81 MG PO TBEC: 81.0000 mg | DELAYED_RELEASE_TABLET | Freq: Every day | ORAL | 0 refills | Status: AC

## 2021-06-15 MED ORDER — NOVOLOG MIX 70/30 FLEXPEN (70-30) 100 UNIT/ML ~~LOC~~ SUPN
40.0000 [IU] | PEN_INJECTOR | Freq: Two times a day (BID) | SUBCUTANEOUS | 11 refills | Status: DC
Start: 2021-06-15 — End: 2022-08-25

## 2021-06-15 MED ORDER — ATORVASTATIN CALCIUM 40 MG PO TABS: 40.0000 mg | ORAL_TABLET | Freq: Every day | ORAL | 3 refills | Status: DC

## 2021-06-15 MED ORDER — WARFARIN SODIUM 5 MG PO TABS
5.0000 mg | ORAL_TABLET | Freq: Once | ORAL | Status: DC
Start: 2021-06-15 — End: 2021-06-15

## 2021-06-15 NOTE — Care Management Important Message (Signed)
Important Message ? ?Patient Details  ?Name: Margaret Mcdaniel ?MRN: [id number] ?Date of Birth: 10-Oct-1950 ? ? ?Medicare Important Message Given:  Other (see comment) ? ?Attempted to reach patient/family via room phone to review Medicare IM, though answer.  Left message with Adline Potter, DIL, at [phone] with reminder regarding right to appeal a discharge through Medicare.  Copy of Medicare IM previously emailed to Ashley's attention on 06/12/21.   ? ?Dannette Barbara ?06/15/2021, 1:33 PM ?

## 2021-06-15 NOTE — Discharge Summary (Signed)
? ?                                                                                ? ? ?Margaret Mcdaniel, is a 71 y.o. female  DOB [birth year]  MRN [id number]. ? ?Admission date:  06/07/2021  Admitting Physician  Ejiroghene Arlyce Dice, MD ? ?Discharge Date:  06/15/2021  ? ?Primary MD  [name], MD ? ?Recommendations for primary care physician for things to follow:  ? ?1)STOP Aspirin once INR is > 2.0 ?2)Take Coumadin 5 mg daily, recheck PT/INR on Wednesday, 06/17/2021 and adjust Coumadin accordingly ?3) repeat CBC and BMP on Wednesday, 06/17/2021 ?4)insulin aspart (novoLOG) injection 0-16 Units  ?0-10 Units Subcutaneous, 3 times daily with meals  ?CBG < 70: Implement Hypoglycemia Standing Orders and refer to Hypoglycemia Standing Orders sidebar report   ?CBG 70 - 120: 0 unit ?CBG 121 - 150: 0 unit ? CBG 151 - 200: 3 unit  ?CBG 201 - 250:  5 units  ?CBG 251 - 300:  8 units  ?CBG 301 - 350:  12 units   ?CBG 351 - 400:  14 units ? CBG > 400: 16 units ? ?5)Avoid ibuprofen/Advil/Aleve/Motrin/Goody Powders/Naproxen/BC powders/Meloxicam/Diclofenac/Indomethacin and other Nonsteroidal anti-inflammatory medications as these will make you more likely to bleed and can cause stomach ulcers, can also cause Kidney problems. ? ?Admission Diagnosis  DKA (diabetic ketoacidosis) ([place]) [E11.10] ?Hyperglycemia [R73.9] ?AKI (acute kidney injury) ([place]) [N17.9] ?History of COPD [Z87.09] ?Parkinson's disease ([place]) [G20] ?Diabetic ketoacidosis without coma associated with diabetes mellitus due to underlying condition ([place]) [E08.10] ? ? ?Discharge Diagnosis  DKA (diabetic ketoacidosis) ([place]) [E11.10] ?Hyperglycemia [R73.9] ?AKI (acute kidney injury) ([place]) [N17.9] ?History of COPD [Z87.09] ?Parkinson's disease ([place]) [G20] ?Diabetic ketoacidosis without coma associated with diabetes mellitus due to underlying condition ([place]) [E08.10]   ? ?Principal Problem: ?  DKA (diabetic ketoacidosis) ([place]) ?Active Problems: ?  Acute CVA (cerebrovascular  accident) ([place]) ?  Leukocytosis ?  Acute metabolic encephalopathy ?  CKD (chronic kidney disease) stage 3, GFR 30-59 ml/min (H[facility]) ?  Uncontrolled type 2 diabetes mellitus with hyperglycemia, with long-term current use of insulin ([place]) ?  Secondary Parkinson disease ([place]) ?  COPD (chronic obstructive pulmonary disease) ([place]) ?  AKI (acute kidney injury) ([place]) ?  Gram-positive cocci bacteremia ?    ? ?Past Medical History:  ?Diagnosis Date  ? Anxiety   ? Arthritis   ? Breast cancer metastasized to axillary lymph node, left ([place]) 04/14/2016  ? Cancer [facility])   ? left breast  ? CKD (chronic kidney disease) stage 3, GFR 30-59 ml/min (HCC) 06/08/2016  ? COPD (chronic obstructive pulmonary disease) ([place])   ? Diabetes mellitus without complication ([place])   ? GERD (gastroesophageal reflux disease)   ? History of kidney stones   ? Hypothyroidism   ? Mental disorder   ? Neuromuscular disorder ([place])   ? PONV (postoperative nausea and vomiting)   ? RLS (restless legs syndrome) 06/29/2016  ? Secondary Parkinson disease ([place]) 06/29/2016  ? Sleep apnea   ? Uses CPAP  ? Uncontrolled type 2 diabetes mellitus with hyperglycemia, with long-term current use of insulin ([place]) 06/08/2016  ? ? ?Past Surgical History:  ?  Procedure Laterality Date  ? ANKLE SURGERY Right   ? fusion  ? APPENDECTOMY    ? CHOLECYSTECTOMY    ? EXPLORATORY LAPAROTOMY    ? HEMORRHOID SURGERY    ? MASTECTOMY MODIFIED RADICAL Left 04/14/2016  ? Procedure: LEFT MODIFIED RADICAL MASTECTOMY;  Surgeon: Aviva Signs, MD;  Location: [facility];  Service: General;  Laterality: Left;  ? PORT-A-CATH REMOVAL Right 12/19/2017  ? Procedure: MINOR REMOVAL PORT-A-CATH;  Surgeon: Aviva Signs, MD;  Location: [facility];  Service: General;  Laterality: Right;  ? PORTACATH PLACEMENT N/A 05/26/2016  ? Procedure: INSERTION PORT-A-CATH RIGHT SUBCLAVIAN AND  DRAINAGE OF SEROMA LEFT CHEST;  Surgeon: Aviva Signs, MD;  Location: [facility];  Service: General;  Laterality: N/A;  ? ROTATOR CUFF REPAIR    ?  TONSILLECTOMY    ? TUBAL LIGATION    ? ? ? HPI  from the history and physical done on the day of admission:  ? ?Chief Complaint: AMS, high blood sugars ?  ?HPI: Margaret Mcdaniel is a 71 y.o. female with medical history significant for diabetes mellitus, Parkinson's disease, COPD respiratory failure on 2 L, CKD 3, breast cancer. ?  ?Patient was brought to the ED from home, reports of altered mental status and high blood sugar.  Patient's son found patient on the floor.  Patient's son talked to patient yesterday at about 11 in the morning and this was the last time patient was here was seen normal.  Today family called patient, patient did not appear to be normal on the phone, and her breathing was labored.  They went over to check on patient and saw patient on the floor, she got up on her knees but could not stand up.   ?EMS was called, blood sugar read high.  Patient was on room air, her O2 sats were 82%. ?At the time of my evaluation, patient is altered, unable to provide any history.  Daughter and son at bedside, they report that in January patient blood sugars were low in the 40s, medications were gradually reduced and stopped.  She was on [place], Ozempic.  She is currently still on metformin. ?  ?ED Course: Temperature 97.7.  Tachycardic heart rate 190 117.  Respirate rate 18-32.  Blood pressure systolic 546E to 703J.  O2 Sats 100% on 2 L. ?Blood sugar 785, anion gap of 20, serum bicarb of 15.  Leukocytosis of 23.3.  Beta Hydroxybutyric acid 2.1. ?1.8 L bolus given, hospitalist to admit for DKA. ?  ?Review of Systems: Unable to ascertain due to patient's altered mental status. ?  ? ? Hospital Course:  ? ?  ?71 y.o. female with medical history significant for diabetes mellitus, Parkinson's disease, COPD respiratory failure on 2 L, CKD 3, breast cancer. ?  ?Patient was brought to the ED from home, reports of altered mental status and high blood sugar.  Patient's son found patient on the floor.  Patient's son talked  to patient yesterday at about 11 in the morning and this was the last time patient was here was seen normal.  Today family called patient, patient did not appear to be normal on the phone, and her breathing was labored.  They went over to check on patient and saw patient on the floor, she got up on her knees but could not stand up.   EMS was called, blood sugar read high.  Patient was on room air, her O2 sats were 82%.  At the time of my evaluation, patient is  altered, unable to provide any history.  Daughter and son at bedside, they report that in January patient blood sugars were low in the 40s, medications were gradually reduced and stopped.  She was on [place], Ozempic.  She is currently still on metformin. ?  ?ED Course: Temperature 97.7.  Tachycardic heart rate 190 117.  Respirate rate 18-32.  Blood pressure systolic 063K to 160F.  O2 Sats 100% on 2 L.  Blood sugar 785, anion gap of 20, serum bicarb of 15.  Leukocytosis of 23.3.  Beta Hydroxybutyric acid 2.1. ?1.8 L bolus given, hospitalist to admit for DKA. ? ? ?06/08/2021 ?MRI reveals: Multiple small patchy acute cortical/subcortical infarcts within the right cerebral hemisphere affecting the posterior frontal lobe, parietal lobe, lateral occipital lobe and posterior temporal lobe.  These infarcts affect the posterior right MCA vascular territory, right MCA/PCA watershed territory and right MCA/ACA watershed territory.  ? ?Neurology consultation requested.  Stroke orderset initiated. ?Pt transitioned off IV insulin to subcutaneous insulin.   ? ?06/09/2021:  Pt is going to need extensive rehabilitation.  PT/OT/SLP evaluations. Spoke with family who is agreeable to rehab.  Held off on CTA due to concerns about using contrast dye with poor renal function. Continue to hydrate today.  ? ?Assessment and Plan: ?  ?-Acute stroke--multiple small patchy acute cortical/subcortical infarcts within the right cerebellar hemisphere ?CTA neck from 06/10/2021 shows-Mural adherent  thrombus in the distal right common carotid artery, extending into the right ECA, causing approximately 60% stenosis of the right ECA. No significant stenosis of the right ICA. ?-Initially treated with aspirin and

## 2021-06-15 NOTE — Progress Notes (Addendum)
ANTICOAGULATION CONSULT NOTE - Follow Up Consult ? ?Pharmacy Consult for warfarin dosing ?Indication: Stroke/Carotid thrombus/hypercoagulabe state ? ?Labs: ?Recent Labs  ?  06/13/21 ?0742 06/14/21 ?0624 06/15/21 ?0656  ?HGB  --   --  7.3*  ?HCT  --   --  24.3*  ?PLT  --   --  347  ?LABPROT 14.7 15.1 19.1*  ?INR 1.2 1.2 1.6*  ? ? ?Estimated Creatinine Clearance: 48 mL/min (A) (by C-G formula based on SCr of 1.11 mg/dL (H)). ? ? ?Assessment: ?71 year old female newly started on warfarin s/p acute stroke/hypercoagulable state due to malignancy/carotid thrombus. INR trending towards goal s/p several doses of '10mg'$ . Will reduce given age. ? ?Goal of Therapy:  ?INR 2-3 ?Monitor platelets by anticoagulation protocol: Yes ?  ?Plan:  ?Warfarin '5mg'$  po x 1 ?DC ASA once INR is therapeutic ?INR daily ?Warfarin education provided ? ?Lorenso Courier, PharmD ?Clinical Pharmacist ?06/15/2021 8:48 AM ? ? ? ? ? ? ? ?

## 2021-06-15 NOTE — Progress Notes (Signed)
Report called [facility]; EMS also notified of need for transportation  ?

## 2021-06-15 NOTE — Plan of Care (Signed)
?  Problem: Education: ?Goal: Knowledge of General Education information will improve ?Description: Including pain rating scale, medication(s)/side effects and non-pharmacologic comfort measures ?Outcome: Adequate for Discharge ?  ?Problem: Health Behavior/Discharge Planning: ?Goal: Ability to manage health-related needs will improve ?Outcome: Adequate for Discharge ?  ?Problem: Clinical Measurements: ?Goal: Ability to maintain clinical measurements within normal limits will improve ?Outcome: Adequate for Discharge ?Goal: Will remain free from infection ?Outcome: Adequate for Discharge ?Goal: Diagnostic test results will improve ?Outcome: Adequate for Discharge ?Goal: Respiratory complications will improve ?Outcome: Adequate for Discharge ?Goal: Cardiovascular complication will be avoided ?Outcome: Adequate for Discharge ?  ?Problem: Activity: ?Goal: Risk for activity intolerance will decrease ?Outcome: Adequate for Discharge ?  ?Problem: Nutrition: ?Goal: Adequate nutrition will be maintained ?Outcome: Adequate for Discharge ?  ?Problem: Coping: ?Goal: Level of anxiety will decrease ?Outcome: Adequate for Discharge ?  ?Problem: Elimination: ?Goal: Will not experience complications related to bowel motility ?Outcome: Adequate for Discharge ?Goal: Will not experience complications related to urinary retention ?Outcome: Adequate for Discharge ?  ?Problem: Pain Managment: ?Goal: General experience of comfort will improve ?Outcome: Adequate for Discharge ?  ?Problem: Safety: ?Goal: Ability to remain free from injury will improve ?Outcome: Adequate for Discharge ?  ?Problem: Skin Integrity: ?Goal: Risk for impaired skin integrity will decrease ?Outcome: Adequate for Discharge ?  ?Problem: Education: ?Goal: Ability to describe self-care measures that may prevent or decrease complications (Diabetes Survival Skills Education) will improve ?Outcome: Adequate for Discharge ?Goal: Individualized Educational Video(s) ?Outcome:  Adequate for Discharge ?  ?Problem: Health Behavior/Discharge Planning: ?Goal: Ability to identify and utilize available resources and services will improve ?Outcome: Adequate for Discharge ?Goal: Ability to manage health-related needs will improve ?Outcome: Adequate for Discharge ?  ?Problem: Metabolic: ?Goal: Ability to maintain appropriate glucose levels will improve ?Outcome: Adequate for Discharge ?  ?Problem: Nutritional: ?Goal: Maintenance of adequate nutrition will improve ?Outcome: Adequate for Discharge ?Goal: Maintenance of adequate weight for body size and type will improve ?Outcome: Adequate for Discharge ?  ?Problem: Respiratory: ?Goal: Will regain and/or maintain adequate ventilation ?Outcome: Adequate for Discharge ?  ?Problem: Education: ?Goal: Knowledge of disease or condition will improve ?Outcome: Adequate for Discharge ?Goal: Knowledge of secondary prevention will improve (SELECT ALL) ?Outcome: Adequate for Discharge ?  ?Problem: Coping: ?Goal: Will verbalize positive feelings about self ?Outcome: Adequate for Discharge ?Goal: Will identify appropriate support needs ?Outcome: Adequate for Discharge ?  ?Problem: Self-Care: ?Goal: Ability to participate in self-care as condition permits will improve ?Outcome: Adequate for Discharge ?  ?Problem: Ischemic Stroke/TIA Tissue Perfusion: ?Goal: Complications of ischemic stroke/TIA will be minimized ?Outcome: Adequate for Discharge ?  ?

## 2021-06-15 NOTE — TOC Transition Note (Signed)
Transition of Care (TOC) - CM/SW Discharge Note ? ? ?Patient Details  ?Name: Margaret Mcdaniel ?MRN: [id number] ?Date of Birth: 12-28-1950 ? ?Transition of Care (TOC) CM/SW Contact:  ?Salome Arnt, LCSW ?Phone Number: ?06/15/2021, 2:21 PM ? ? ?Clinical Narrative:  Pt d/c today to [facility]. Pt's daughter-in-law, [name] and facility aware and agreeable. RN given number to call report. [name] feels EMS transport will be safest. Arranged with [facility]. LCSW verified with [employer] that SNF/EMS transport auth still valid. D/C summary sent to SNF.   ? ? ? ?Final next level of care: [place] ?Barriers to Discharge: Barriers Resolved ? ? ?Patient Goals and CMS Choice ?Patient states their goals for this hospitalization and ongoing recovery are:: hopeful for return home ?  ?Choice offered to / list presented to : Adult Children ? ?Discharge Placement ?  ?Existing PASRR number confirmed : 06/15/21          ?Patient chooses bed at: Other - please specify in the comment section below: ([facility]) ?Patient to be transferred to facility by: [facility] ?Name of family member notified: [name]- daughter-in-law ?Patient and family notified of of transfer: 06/15/21 ? ?Discharge Plan and Services ?In-house Referral: Clinical Social Work ?  ?           ?  ?  ?  ?  ?  ?  ?  ?  ?  ?  ? ?Social Determinants of Health (SDOH) Interventions ?  ? ? ?Readmission Risk Interventions ? ?  06/08/2021  ? 11:26 AM  ?Readmission Risk Prevention Plan  ?Transportation Screening Complete  ?[place] or Home Care Consult Complete  ?Social Work Consult for [place] Planning/Counseling Complete  ?Palliative Care Screening Not Applicable  ?Medication Review Press photographer) Complete  ? ? ? ? ? ?

## 2021-06-15 NOTE — Progress Notes (Signed)
Occupational Therapy Treatment ?Patient Details ?Name: Margaret Mcdaniel ?MRN: [id number] ?DOB: 1950-06-12 ?Today's Date: 06/15/2021 ? ? ?History of present illness RICKEY FARRIER is a 71 y.o. female with medical history significant for diabetes mellitus, Parkinson's disease, COPD respiratory failure on 2 L, CKD 3, breast cancer.     Patient was brought to the ED from home, reports of altered mental status and high blood sugar.  Patient's son found patient on the floor.  Patient's son talked to patient yesterday at about 11 in the morning and this was the last time patient was here was seen normal.  Today family called patient, patient did not appear to be normal on the phone, and her breathing was labored.  They went over to check on patient and saw patient on the floor, she got up on her knees but could not stand up.    EMS was called, blood sugar read high.  Patient was on room air, her O2 sats were 82%.  At the time of my evaluation, patient is altered, unable to provide any history.  Daughter and son at bedside, they report that in January patient blood sugars were low in the 40s, medications were gradually reduced and stopped.  She was on [place], Ozempic.  She is currently still on metformin. ?  ?OT comments ? Pt demonstrating improvement this session based on initial evaluation. She is able to demonstrate active movement in her left UE and lower extremity. Does present with uncoordinated movements and seems to forget what movement she is supposed to make after several repetitions. OT will continue to follow patient acutely.  ? ?Recommendations for follow up therapy are one component of a multi-disciplinary discharge planning process, led by the attending physician.  Recommendations may be updated based on patient status, additional functional criteria and insurance authorization. ?   ?   ?   ?   ?   ?   ?Precautions / Restrictions Precautions ?Precautions: Fall ?Restrictions ?Weight Bearing Restrictions: No   ? ? ?  ? ?Mobility Bed Mobility ?Overal bed mobility: Needs Assistance ?Bed Mobility: Supine to Sit ?  ?  ?Supine to sit: Mod assist ?  ?  ?General bed mobility comments: HOB lowered. required assist for hand placement when using bed rail. ?  ? ?Transfers ?Overall transfer level: Needs assistance ?Equipment used: Rolling walker (2 wheels) ?Transfers: Sit to/from Stand, Bed to chair/wheelchair/BSC ?Sit to Stand: Mod assist ?  ?  ?Step pivot transfers: Min assist ?  ?  ?General transfer comment: Required hand placement prior to standing. ?  ?  ?Balance Overall balance assessment: Needs assistance ?Sitting-balance support: Feet supported, Single extremity supported ?Sitting balance-Leahy Scale: Fair ?Sitting balance - Comments: seated EOB ?  ?Standing balance support: Reliant on assistive device for balance, During functional activity, Bilateral upper extremity supported ?Standing balance-Leahy Scale: Poor ?  ?  ?   ? ?ADL either performed or assessed with clinical judgement  ? ?ADL Overall ADL's : Needs assistance/impaired ?  ?  ?  ?  ?  ?  ?  ?  ?Upper Body Dressing : Moderate assistance;Bed level ?Upper Body Dressing Details (indicate cue type and reason): due to accident in bed ?  ?  ?  ?  ?Toileting- Clothing Manipulation and Hygiene: Bed level;Total assistance ?Toileting - Clothing Manipulation Details (indicate cue type and reason): due to accident in bed ?  ?  ?  ?  ?  ? ?Extremity/Trunk Assessment Upper Extremity Assessment ?Upper Extremity Assessment: Generalized weakness ?  LUE Deficits / Details: shoulder flexion, external rotation: 3/5. Patient able to demonstrate functional ROM in the shoulder and attempted to use her LUE during functional task. A/ROM elbow, wrist, and hand all functional although remains uncoordinated. ?LUE Coordination: decreased gross motor;decreased fine motor ?  ?  ?  ?  ?  ? ? ?Cognition Arousal/Alertness: Awake/alert ?Behavior During Therapy: Flat affect ?Overall Cognitive Status:  Difficult to assess ?  ?  ?  ?  ?   ?Exercises Shoulder Exercises ?Shoulder Flexion: AROM, 5 reps, Seated, Left ?Other Exercises ?Other Exercises: left UE, shoulder protraction, 5X, seated ? ?  ?   ?   ? ? ?Pertinent Vitals/ Pain       Pain Assessment ?Pain Assessment: No/denies pain ? ?   ? ?Progress Toward Goals ? ?OT Goals(current goals can now be found in the care plan section) ? Progress towards OT goals: Progressing toward goals ? ?   ?Plan Discharge plan remains appropriate;Frequency remains appropriate   ? ?Co-evaluation ? ? ? PT/OT/SLP Co-Evaluation/Treatment: Yes ?Reason for Co-Treatment: To address functional/ADL transfers ?  ?OT goals addressed during session: ADL's and self-care;Strengthening/ROM;Proper use of Adaptive equipment and DME ?  ? ?  ?AM-PAC OT "6 Clicks" Daily Activity     ?Outcome Measure ? ? Help from another person eating meals?: A Little ?Help from another person taking care of personal grooming?: A Little ?Help from another person toileting, which includes using toliet, bedpan, or urinal?: Total ?Help from another person bathing (including washing, rinsing, drying)?: A Lot ?Help from another person to put on and taking off regular upper body clothing?: A Little ?Help from another person to put on and taking off regular lower body clothing?: A Lot ?6 Click Score: 14 ? ?  ?End of Session Equipment Utilized During Treatment: Rolling walker (2 wheels);Gait belt ? ?OT Visit Diagnosis: Muscle weakness (generalized) (M62.81);Unsteadiness on feet (R26.81);Other abnormalities of gait and mobility (R26.89);Low vision, both eyes (H54.2);Hemiplegia and hemiparesis;Other symptoms and signs involving cognitive function ?Hemiplegia - Right/Left: Left ?Hemiplegia - dominant/non-dominant: Non-Dominant ?  ?Activity Tolerance Patient tolerated treatment well;No increased pain ?  ?Patient Left in chair;with call bell/phone within reach;with chair alarm set;with nursing/sitter in room ?  ?Nurse  Communication Mobility status ?  ? ?   ? ?Time: [id number] ?OT Time Calculation (min): 18 min ? ?Charges: OT General Charges ?$OT Visit: 1 Visit ?OT Treatments ?$Therapeutic Exercise: 8-22 mins (8') ? ?Ailene Ravel, OTR/L,CBIS  ?[phone] ? ? ?Myles Mallicoat, Mickel Baas D ?06/15/2021, 10:11 AM ?

## 2021-06-15 NOTE — Progress Notes (Signed)
Physical Therapy Treatment ?Patient Details ?Name: Margaret Mcdaniel ?MRN: [id number] ?DOB: 01-05-1951 ?Today's Date: 06/15/2021 ? ? ?History of Present Illness Margaret Mcdaniel is a 71 y.o. female with medical history significant for diabetes mellitus, Parkinson's disease, COPD respiratory failure on 2 L, CKD 3, breast cancer.     Patient was brought to the ED from home, reports of altered mental status and high blood sugar.  Patient's son found patient on the floor.  Patient's son talked to patient yesterday at about 11 in the morning and this was the last time patient was here was seen normal.  Today family called patient, patient did not appear to be normal on the phone, and her breathing was labored.  They went over to check on patient and saw patient on the floor, she got up on her knees but could not stand up.    EMS was called, blood sugar read high.  Patient was on room air, her O2 sats were 82%.  At the time of my evaluation, patient is altered, unable to provide any history.  Daughter and son at bedside, they report that in January patient blood sugars were low in the 40s, medications were gradually reduced and stopped.  She was on [place], Ozempic.  She is currently still on metformin. ? ?  ?PT Comments  ? ? Patient demonstrates slow labored movement for sitting up at bedside, increased LUE strength for pulling self to sitting using bed rail and Mod assist.  Patient has increased LLE strength for ankle dorsiflexion and advancing when taking steps at bedside using RW, able to lift left foot off floor for taking side steps, but limited mostly due to fatigue and poor standing balance.  Patient tolerated sitting up in chair after therapy - nursing staff aware.  Patient will benefit from continued skilled physical therapy in hospital and recommended venue below to increase strength, balance, endurance for safe ADLs and gait.  ?  ?Recommendations for follow up therapy are one component of a multi-disciplinary  discharge planning process, led by the attending physician.  Recommendations may be updated based on patient status, additional functional criteria and insurance authorization. ? ?Follow Up Recommendations ? Acute inpatient rehab (3hours/day) ?  ?  ?Assistance Recommended at Discharge Intermittent Supervision/Assistance  ?Patient can return home with the following A lot of help with bathing/dressing/bathroom;A lot of help with walking and/or transfers;Help with stairs or ramp for entrance;Assistance with cooking/housework;Assist for transportation ?  ?Equipment Recommendations ? Rolling walker (2 wheels);None recommended by PT  ?  ?Recommendations for Other Services   ? ? ?  ?Precautions / Restrictions Precautions ?Precautions: Fall ?Restrictions ?Weight Bearing Restrictions: No  ?  ? ?Mobility ? Bed Mobility ?Overal bed mobility: Needs Assistance ?Bed Mobility: Supine to Sit ?  ?  ?Supine to sit: Mod assist ?  ?  ?General bed mobility comments: as per OT notes ?  ? ?Transfers ?Overall transfer level: Needs assistance ?Equipment used: Rolling walker (2 wheels) ?Transfers: Sit to/from Stand, Bed to chair/wheelchair/BSC ?Sit to Stand: Mod assist ?Stand pivot transfers: Mod assist ?Step pivot transfers: Min assist, Mod assist ?  ?  ?  ?General transfer comment: slow labored movement with increased LLE strength for taking side steps ?  ? ?Ambulation/Gait ?Ambulation/Gait assistance: Max assist, Mod assist ?Gait Distance (Feet): 5 Feet ?Assistive device: Rolling walker (2 wheels) ?  ?Gait velocity: slow ?  ?  ?General Gait Details: demonstrates increased LLE strength and able to advance LLE with less tactile cueing and  increased left ankle dorsiflexion against gravity ? ? ?Stairs ?  ?  ?  ?  ?  ? ? ?Wheelchair Mobility ?  ? ?Modified Rankin (Stroke Patients Only) ?  ? ? ?  ?Balance Overall balance assessment: Needs assistance ?Sitting-balance support: Feet supported, No upper extremity supported ?Sitting balance-Leahy  Scale: Fair ?Sitting balance - Comments: seated EOB ?  ?Standing balance support: During functional activity, Bilateral upper extremity supported ?Standing balance-Leahy Scale: Poor ?Standing balance comment: using RW ?  ?  ?  ?  ?  ?  ?  ?  ?  ?  ?  ?  ? ?  ?Cognition Arousal/Alertness: Awake/alert ?Behavior During Therapy: [facility] for tasks assessed/performed, Flat affect ?Overall Cognitive Status: Within Functional Limits for tasks assessed ?  ?  ?  ?  ?  ?  ?  ?  ?  ?  ?  ?  ?  ?  ?  ?  ?General Comments: requires frequent repeated verbal cues to complete tasks ?  ?  ? ?  ?Exercises   ? ?  ?General Comments   ?  ?  ? ?Pertinent Vitals/Pain Pain Assessment ?Pain Assessment: No/denies pain  ? ? ?Home Living   ?  ?  ?  ?  ?  ?  ?  ?  ?  ?   ?  ?Prior Function    ?  ?  ?   ? ?PT Goals (current goals can now be found in the care plan section) Acute Rehab PT Goals ?Patient Stated Goal: return home after rehab ?PT Goal Formulation: With patient ?Time For Goal Achievement: 06/24/21 ?Potential to Achieve Goals: Good ?Progress towards PT goals: Progressing toward goals ? ?  ?Frequency ? ? ? Min 5X/week ? ? ? ?  ?PT Plan Current plan remains appropriate  ? ? ?Co-evaluation PT/OT/SLP Co-Evaluation/Treatment: Yes ?Reason for Co-Treatment: To address functional/ADL transfers ?PT goals addressed during session: Mobility/safety with mobility;Balance;Proper use of DME;Strengthening/ROM ?OT goals addressed during session: ADL's and self-care;Strengthening/ROM;Proper use of Adaptive equipment and DME ?  ? ?  ?AM-PAC PT "6 Clicks" Mobility   ?Outcome Measure ? Help needed turning from your back to your side while in a flat bed without using bedrails?: A Little ?Help needed moving from lying on your back to sitting on the side of a flat bed without using bedrails?: A Lot ?Help needed moving to and from a bed to a chair (including a wheelchair)?: A Lot ?Help needed standing up from a chair using your arms (e.g., wheelchair or bedside  chair)?: A Lot ?Help needed to walk in hospital room?: A Lot ?Help needed climbing 3-5 steps with a railing? : Total ?6 Click Score: 12 ? ?  ?End of Session Equipment Utilized During Treatment: Oxygen ?Activity Tolerance: Patient tolerated treatment well ?Patient left: in chair;with call bell/phone within reach;with chair alarm set ?Nurse Communication: Mobility status ?PT Visit Diagnosis: Other abnormalities of gait and mobility (R26.89);Unsteadiness on feet (R26.81);Muscle weakness (generalized) (M62.81) ?  ? ? ?Time: 9622-2979 ?PT Time Calculation (min) (ACUTE ONLY): 20 min ? ?Charges:  $Therapeutic Activity: 8-22 mins          ?          ? ?1:50 PM, 06/15/21 ?Lonell Grandchild, MPT ?Physical Therapist with [place] ?[facility] ?[phone] office ?[phone] mobile phone ? ? ?

## 2021-07-09 DIAGNOSIS — Z7982 Long term (current) use of aspirin: Secondary | ICD-10-CM | POA: Diagnosis not present

## 2021-07-09 DIAGNOSIS — M25529 Pain in unspecified elbow: Secondary | ICD-10-CM | POA: Diagnosis not present

## 2021-07-09 DIAGNOSIS — Z91040 Latex allergy status: Secondary | ICD-10-CM | POA: Diagnosis not present

## 2021-07-09 DIAGNOSIS — F0390 Unspecified dementia without behavioral disturbance: Secondary | ICD-10-CM | POA: Diagnosis not present

## 2021-07-09 DIAGNOSIS — S0990XA Unspecified injury of head, initial encounter: Secondary | ICD-10-CM | POA: Diagnosis not present

## 2021-07-09 DIAGNOSIS — M5030 Other cervical disc degeneration, unspecified cervical region: Secondary | ICD-10-CM | POA: Diagnosis not present

## 2021-07-09 DIAGNOSIS — Z8673 Personal history of transient ischemic attack (TIA), and cerebral infarction without residual deficits: Secondary | ICD-10-CM | POA: Diagnosis not present

## 2021-07-09 DIAGNOSIS — Z85830 Personal history of malignant neoplasm of bone: Secondary | ICD-10-CM | POA: Diagnosis not present

## 2021-07-09 DIAGNOSIS — Z881 Allergy status to other antibiotic agents status: Secondary | ICD-10-CM | POA: Diagnosis not present

## 2021-07-09 DIAGNOSIS — S199XXA Unspecified injury of neck, initial encounter: Secondary | ICD-10-CM | POA: Diagnosis not present

## 2021-07-09 DIAGNOSIS — I6523 Occlusion and stenosis of bilateral carotid arteries: Secondary | ICD-10-CM | POA: Diagnosis not present

## 2021-07-09 DIAGNOSIS — M25521 Pain in right elbow: Secondary | ICD-10-CM | POA: Diagnosis not present

## 2021-07-09 DIAGNOSIS — Z79890 Hormone replacement therapy (postmenopausal): Secondary | ICD-10-CM | POA: Diagnosis not present

## 2021-07-09 DIAGNOSIS — R52 Pain, unspecified: Secondary | ICD-10-CM | POA: Diagnosis not present

## 2021-07-09 DIAGNOSIS — W19XXXA Unspecified fall, initial encounter: Secondary | ICD-10-CM | POA: Diagnosis not present

## 2021-07-09 DIAGNOSIS — Z794 Long term (current) use of insulin: Secondary | ICD-10-CM | POA: Diagnosis not present

## 2021-07-09 DIAGNOSIS — Z882 Allergy status to sulfonamides status: Secondary | ICD-10-CM | POA: Diagnosis not present

## 2021-07-10 DIAGNOSIS — R509 Fever, unspecified: Secondary | ICD-10-CM | POA: Diagnosis not present

## 2021-07-10 DIAGNOSIS — R739 Hyperglycemia, unspecified: Secondary | ICD-10-CM | POA: Diagnosis not present

## 2021-07-26 DIAGNOSIS — J449 Chronic obstructive pulmonary disease, unspecified: Secondary | ICD-10-CM | POA: Diagnosis not present

## 2021-07-26 DIAGNOSIS — E1310 Other specified diabetes mellitus with ketoacidosis without coma: Secondary | ICD-10-CM | POA: Diagnosis not present

## 2021-07-26 DIAGNOSIS — Z8673 Personal history of transient ischemic attack (TIA), and cerebral infarction without residual deficits: Secondary | ICD-10-CM | POA: Diagnosis not present

## 2021-07-26 DIAGNOSIS — I10 Essential (primary) hypertension: Secondary | ICD-10-CM | POA: Diagnosis not present

## 2021-08-25 DIAGNOSIS — Z8673 Personal history of transient ischemic attack (TIA), and cerebral infarction without residual deficits: Secondary | ICD-10-CM | POA: Diagnosis not present

## 2021-08-25 DIAGNOSIS — E1310 Other specified diabetes mellitus with ketoacidosis without coma: Secondary | ICD-10-CM | POA: Diagnosis not present

## 2021-08-25 DIAGNOSIS — I10 Essential (primary) hypertension: Secondary | ICD-10-CM | POA: Diagnosis not present

## 2021-08-25 DIAGNOSIS — J449 Chronic obstructive pulmonary disease, unspecified: Secondary | ICD-10-CM | POA: Diagnosis not present

## 2021-09-23 DIAGNOSIS — F411 Generalized anxiety disorder: Secondary | ICD-10-CM | POA: Diagnosis not present

## 2021-09-23 DIAGNOSIS — F32A Depression, unspecified: Secondary | ICD-10-CM | POA: Diagnosis not present

## 2021-09-24 DIAGNOSIS — I10 Essential (primary) hypertension: Secondary | ICD-10-CM | POA: Diagnosis not present

## 2021-09-24 DIAGNOSIS — Z8673 Personal history of transient ischemic attack (TIA), and cerebral infarction without residual deficits: Secondary | ICD-10-CM | POA: Diagnosis not present

## 2021-09-24 DIAGNOSIS — E1310 Other specified diabetes mellitus with ketoacidosis without coma: Secondary | ICD-10-CM | POA: Diagnosis not present

## 2021-09-24 DIAGNOSIS — J449 Chronic obstructive pulmonary disease, unspecified: Secondary | ICD-10-CM | POA: Diagnosis not present

## 2021-10-25 DIAGNOSIS — E1310 Other specified diabetes mellitus with ketoacidosis without coma: Secondary | ICD-10-CM | POA: Diagnosis not present

## 2021-10-25 DIAGNOSIS — J449 Chronic obstructive pulmonary disease, unspecified: Secondary | ICD-10-CM | POA: Diagnosis not present

## 2021-10-25 DIAGNOSIS — Z8673 Personal history of transient ischemic attack (TIA), and cerebral infarction without residual deficits: Secondary | ICD-10-CM | POA: Diagnosis not present

## 2021-10-25 DIAGNOSIS — I10 Essential (primary) hypertension: Secondary | ICD-10-CM | POA: Diagnosis not present

## 2021-11-12 DIAGNOSIS — Z23 Encounter for immunization: Secondary | ICD-10-CM | POA: Diagnosis not present

## 2021-11-25 DIAGNOSIS — Z8673 Personal history of transient ischemic attack (TIA), and cerebral infarction without residual deficits: Secondary | ICD-10-CM | POA: Diagnosis not present

## 2021-11-25 DIAGNOSIS — E1310 Other specified diabetes mellitus with ketoacidosis without coma: Secondary | ICD-10-CM | POA: Diagnosis not present

## 2021-11-25 DIAGNOSIS — I10 Essential (primary) hypertension: Secondary | ICD-10-CM | POA: Diagnosis not present

## 2021-11-25 DIAGNOSIS — J449 Chronic obstructive pulmonary disease, unspecified: Secondary | ICD-10-CM | POA: Diagnosis not present

## 2021-12-27 DIAGNOSIS — Z8673 Personal history of transient ischemic attack (TIA), and cerebral infarction without residual deficits: Secondary | ICD-10-CM | POA: Diagnosis not present

## 2021-12-27 DIAGNOSIS — I10 Essential (primary) hypertension: Secondary | ICD-10-CM | POA: Diagnosis not present

## 2021-12-27 DIAGNOSIS — J449 Chronic obstructive pulmonary disease, unspecified: Secondary | ICD-10-CM | POA: Diagnosis not present

## 2021-12-27 DIAGNOSIS — E1310 Other specified diabetes mellitus with ketoacidosis without coma: Secondary | ICD-10-CM | POA: Diagnosis not present

## 2022-02-03 DIAGNOSIS — E1310 Other specified diabetes mellitus with ketoacidosis without coma: Secondary | ICD-10-CM | POA: Diagnosis not present

## 2022-02-03 DIAGNOSIS — I10 Essential (primary) hypertension: Secondary | ICD-10-CM | POA: Diagnosis not present

## 2022-02-03 DIAGNOSIS — J449 Chronic obstructive pulmonary disease, unspecified: Secondary | ICD-10-CM | POA: Diagnosis not present

## 2022-02-03 DIAGNOSIS — Z8673 Personal history of transient ischemic attack (TIA), and cerebral infarction without residual deficits: Secondary | ICD-10-CM | POA: Diagnosis not present

## 2022-03-08 DEATH — deceased
